# Patient Record
Sex: Female | Born: 1935
Health system: Southern US, Community
[De-identification: ages and names within clinical notes are randomized; demographics above are authoritative.]

## PROBLEM LIST (undated history)

## (undated) DIAGNOSIS — I10 Essential (primary) hypertension: Secondary | ICD-10-CM

## (undated) DIAGNOSIS — E785 Hyperlipidemia, unspecified: Secondary | ICD-10-CM

## (undated) DIAGNOSIS — E119 Type 2 diabetes mellitus without complications: Secondary | ICD-10-CM

## (undated) DIAGNOSIS — B029 Zoster without complications: Secondary | ICD-10-CM

## (undated) DIAGNOSIS — E039 Hypothyroidism, unspecified: Secondary | ICD-10-CM

## (undated) DIAGNOSIS — G459 Transient cerebral ischemic attack, unspecified: Secondary | ICD-10-CM

## (undated) DIAGNOSIS — R002 Palpitations: Secondary | ICD-10-CM

## (undated) DIAGNOSIS — I639 Cerebral infarction, unspecified: Secondary | ICD-10-CM

## (undated) DIAGNOSIS — I739 Peripheral vascular disease, unspecified: Secondary | ICD-10-CM

## (undated) DIAGNOSIS — J189 Pneumonia, unspecified organism: Secondary | ICD-10-CM

## (undated) DIAGNOSIS — N8110 Cystocele, unspecified: Secondary | ICD-10-CM

## (undated) DIAGNOSIS — C50911 Malignant neoplasm of unspecified site of right female breast: Secondary | ICD-10-CM

## (undated) DIAGNOSIS — I493 Ventricular premature depolarization: Secondary | ICD-10-CM

## (undated) DIAGNOSIS — I714 Abdominal aortic aneurysm, without rupture, unspecified: Secondary | ICD-10-CM

## (undated) DIAGNOSIS — E538 Deficiency of other specified B group vitamins: Secondary | ICD-10-CM

## (undated) DIAGNOSIS — I6529 Occlusion and stenosis of unspecified carotid artery: Secondary | ICD-10-CM

## (undated) DIAGNOSIS — N390 Urinary tract infection, site not specified: Secondary | ICD-10-CM

## (undated) DIAGNOSIS — J449 Chronic obstructive pulmonary disease, unspecified: Secondary | ICD-10-CM

## (undated) HISTORY — PX: THYROIDECTOMY: SHX17

## (undated) HISTORY — DX: Abdominal aortic aneurysm, without rupture: I71.4

## (undated) HISTORY — DX: Essential (primary) hypertension: I10

## (undated) HISTORY — DX: Hyperlipidemia, unspecified: E78.5

## (undated) HISTORY — DX: Urinary tract infection, site not specified: N39.0

## (undated) HISTORY — PX: CATARACT EXTRACTION W/ INTRAOCULAR LENS  IMPLANT, BILATERAL: SHX1307

## (undated) HISTORY — DX: Cerebral infarction, unspecified: I63.9

## (undated) HISTORY — DX: Palpitations: R00.2

## (undated) HISTORY — DX: Occlusion and stenosis of unspecified carotid artery: I65.29

## (undated) HISTORY — DX: Ventricular premature depolarization: I49.3

## (undated) HISTORY — DX: Deficiency of other specified B group vitamins: E53.8

## (undated) HISTORY — DX: Abdominal aortic aneurysm, without rupture, unspecified: I71.40

## (undated) HISTORY — PX: DILATION AND CURETTAGE OF UTERUS: SHX78

## (undated) HISTORY — PX: MASTECTOMY, RADICAL: SHX710

## (undated) HISTORY — DX: Peripheral vascular disease, unspecified: I73.9

## (undated) HISTORY — DX: Type 2 diabetes mellitus without complications: E11.9

## (undated) HISTORY — DX: Hypothyroidism, unspecified: E03.9

## (undated) HISTORY — DX: Zoster without complications: B02.9

## (undated) HISTORY — PX: ABDOMINAL HYSTERECTOMY: SHX81

## (undated) HISTORY — DX: Chronic obstructive pulmonary disease, unspecified: J44.9

---

## 1958-02-10 DIAGNOSIS — C50911 Malignant neoplasm of unspecified site of right female breast: Secondary | ICD-10-CM

## 1958-02-10 HISTORY — DX: Malignant neoplasm of unspecified site of right female breast: C50.911

## 2003-02-07 ENCOUNTER — Encounter: Payer: Self-pay | Admitting: Internal Medicine

## 2004-02-11 DIAGNOSIS — I639 Cerebral infarction, unspecified: Secondary | ICD-10-CM

## 2004-02-11 HISTORY — DX: Cerebral infarction, unspecified: I63.9

## 2004-02-17 ENCOUNTER — Inpatient Hospital Stay (HOSPITAL_COMMUNITY): Admission: EM | Admit: 2004-02-17 | Discharge: 2004-02-19 | Payer: Self-pay | Admitting: Emergency Medicine

## 2004-02-19 ENCOUNTER — Encounter (INDEPENDENT_AMBULATORY_CARE_PROVIDER_SITE_OTHER): Payer: Self-pay | Admitting: Cardiology

## 2004-02-29 ENCOUNTER — Ambulatory Visit: Payer: Self-pay | Admitting: Family Medicine

## 2004-03-11 ENCOUNTER — Ambulatory Visit: Payer: Self-pay | Admitting: Family Medicine

## 2004-04-01 ENCOUNTER — Ambulatory Visit: Payer: Self-pay | Admitting: Family Medicine

## 2004-04-05 ENCOUNTER — Encounter: Payer: Self-pay | Admitting: Internal Medicine

## 2004-05-02 ENCOUNTER — Ambulatory Visit: Payer: Self-pay | Admitting: Family Medicine

## 2004-05-06 ENCOUNTER — Ambulatory Visit: Payer: Self-pay | Admitting: Family Medicine

## 2004-07-04 ENCOUNTER — Ambulatory Visit: Payer: Self-pay | Admitting: Family Medicine

## 2004-08-08 ENCOUNTER — Ambulatory Visit: Payer: Self-pay | Admitting: Family Medicine

## 2004-08-14 ENCOUNTER — Ambulatory Visit: Payer: Self-pay | Admitting: Internal Medicine

## 2004-09-25 ENCOUNTER — Ambulatory Visit: Payer: Self-pay | Admitting: Internal Medicine

## 2004-10-01 ENCOUNTER — Encounter: Payer: Self-pay | Admitting: Internal Medicine

## 2004-10-01 ENCOUNTER — Ambulatory Visit: Payer: Self-pay

## 2004-11-14 ENCOUNTER — Ambulatory Visit: Payer: Self-pay | Admitting: Internal Medicine

## 2004-11-21 ENCOUNTER — Ambulatory Visit: Payer: Self-pay | Admitting: Internal Medicine

## 2004-12-26 ENCOUNTER — Ambulatory Visit: Payer: Self-pay | Admitting: Internal Medicine

## 2005-01-14 ENCOUNTER — Ambulatory Visit: Payer: Self-pay | Admitting: Gastroenterology

## 2005-01-29 ENCOUNTER — Encounter (INDEPENDENT_AMBULATORY_CARE_PROVIDER_SITE_OTHER): Payer: Self-pay | Admitting: *Deleted

## 2005-01-29 ENCOUNTER — Ambulatory Visit: Payer: Self-pay | Admitting: Gastroenterology

## 2005-01-29 ENCOUNTER — Encounter: Payer: Self-pay | Admitting: Internal Medicine

## 2005-02-13 ENCOUNTER — Ambulatory Visit: Payer: Self-pay | Admitting: Internal Medicine

## 2005-03-18 ENCOUNTER — Ambulatory Visit: Payer: Self-pay | Admitting: Internal Medicine

## 2005-04-25 ENCOUNTER — Ambulatory Visit: Payer: Self-pay | Admitting: Internal Medicine

## 2005-09-18 ENCOUNTER — Ambulatory Visit: Payer: Self-pay | Admitting: Internal Medicine

## 2005-11-05 ENCOUNTER — Ambulatory Visit: Payer: Self-pay | Admitting: Internal Medicine

## 2005-11-13 ENCOUNTER — Ambulatory Visit: Payer: Self-pay | Admitting: Internal Medicine

## 2005-11-16 ENCOUNTER — Encounter: Admission: RE | Admit: 2005-11-16 | Discharge: 2005-11-16 | Payer: Self-pay | Admitting: Internal Medicine

## 2005-12-03 ENCOUNTER — Ambulatory Visit: Payer: Self-pay | Admitting: Internal Medicine

## 2006-01-05 ENCOUNTER — Ambulatory Visit: Payer: Self-pay | Admitting: Internal Medicine

## 2006-01-06 ENCOUNTER — Ambulatory Visit: Payer: Self-pay | Admitting: Internal Medicine

## 2006-01-06 LAB — CONVERTED CEMR LAB
Hgb A1c MFr Bld: 5.8 % (ref 4.6–6.0)
Homocysteine: 13.1 micromoles/L (ref 5.00–13.90)
Sed Rate: 36 mm/hr — ABNORMAL HIGH (ref 0–25)
Triglyceride fasting, serum: 112 mg/dL (ref 0–149)

## 2006-02-17 ENCOUNTER — Encounter: Payer: Self-pay | Admitting: Internal Medicine

## 2006-03-05 DIAGNOSIS — I714 Abdominal aortic aneurysm, without rupture, unspecified: Secondary | ICD-10-CM | POA: Insufficient documentation

## 2006-03-05 DIAGNOSIS — E039 Hypothyroidism, unspecified: Secondary | ICD-10-CM | POA: Insufficient documentation

## 2006-03-05 DIAGNOSIS — E785 Hyperlipidemia, unspecified: Secondary | ICD-10-CM

## 2006-03-09 ENCOUNTER — Ambulatory Visit: Payer: Self-pay | Admitting: Internal Medicine

## 2006-04-09 ENCOUNTER — Ambulatory Visit: Payer: Self-pay | Admitting: Internal Medicine

## 2006-04-09 LAB — CONVERTED CEMR LAB
BUN: 10 mg/dL (ref 6–23)
CO2: 28 meq/L (ref 19–32)
Calcium: 9.3 mg/dL (ref 8.4–10.5)
GFR calc Af Amer: 91 mL/min
GFR calc non Af Amer: 75 mL/min
Potassium: 4.2 meq/L (ref 3.5–5.1)

## 2006-04-23 ENCOUNTER — Ambulatory Visit: Payer: Self-pay | Admitting: Internal Medicine

## 2006-07-16 ENCOUNTER — Telehealth (INDEPENDENT_AMBULATORY_CARE_PROVIDER_SITE_OTHER): Payer: Self-pay | Admitting: *Deleted

## 2006-08-03 ENCOUNTER — Ambulatory Visit: Payer: Self-pay | Admitting: Internal Medicine

## 2006-08-03 DIAGNOSIS — Z9089 Acquired absence of other organs: Secondary | ICD-10-CM

## 2006-08-03 DIAGNOSIS — E890 Postprocedural hypothyroidism: Secondary | ICD-10-CM | POA: Insufficient documentation

## 2006-08-03 DIAGNOSIS — I10 Essential (primary) hypertension: Secondary | ICD-10-CM | POA: Insufficient documentation

## 2006-08-03 DIAGNOSIS — I6350 Cerebral infarction due to unspecified occlusion or stenosis of unspecified cerebral artery: Secondary | ICD-10-CM | POA: Insufficient documentation

## 2006-08-03 DIAGNOSIS — D126 Benign neoplasm of colon, unspecified: Secondary | ICD-10-CM | POA: Insufficient documentation

## 2006-08-03 DIAGNOSIS — M810 Age-related osteoporosis without current pathological fracture: Secondary | ICD-10-CM | POA: Insufficient documentation

## 2006-08-05 LAB — CONVERTED CEMR LAB
ALT: 10 units/L (ref 0–40)
Basophils Absolute: 0.1 10*3/uL (ref 0.0–0.1)
Calcium: 9.3 mg/dL (ref 8.4–10.5)
Chloride: 110 meq/L (ref 96–112)
Cholesterol: 230 mg/dL (ref 0–200)
Direct LDL: 149.2 mg/dL
Eosinophils Absolute: 0.1 10*3/uL (ref 0.0–0.6)
GFR calc Af Amer: 90 mL/min
GFR calc non Af Amer: 75 mL/min
Glucose, Bld: 87 mg/dL (ref 70–99)
HDL: 42.7 mg/dL (ref >39.0)
Lymphocytes Relative: 26 % (ref 12.0–46.0)
MCHC: 34.3 g/dL (ref 30.0–36.0)
MCV: 92 fL (ref 78.0–100.0)
Monocytes Relative: 5.9 % (ref 3.0–11.0)
Neutro Abs: 8.1 10*3/uL — ABNORMAL HIGH (ref 1.4–7.7)
Platelets: 298 10*3/uL (ref 150–400)
RBC: 4.34 M/uL (ref 3.87–5.11)
Sodium: 143 meq/L (ref 135–145)
TSH: 2.48 microintl units/mL (ref 0.35–5.50)
Triglycerides: 151 mg/dL — ABNORMAL HIGH (ref 0–149)

## 2006-09-23 ENCOUNTER — Ambulatory Visit: Payer: Self-pay | Admitting: Internal Medicine

## 2006-09-29 ENCOUNTER — Ambulatory Visit: Payer: Self-pay | Admitting: Vascular Surgery

## 2006-09-30 ENCOUNTER — Ambulatory Visit: Payer: Self-pay | Admitting: Vascular Surgery

## 2006-10-02 ENCOUNTER — Encounter: Payer: Self-pay | Admitting: Internal Medicine

## 2006-10-26 ENCOUNTER — Ambulatory Visit: Payer: Self-pay | Admitting: Internal Medicine

## 2006-10-28 ENCOUNTER — Telehealth: Payer: Self-pay | Admitting: Internal Medicine

## 2006-10-29 ENCOUNTER — Encounter (INDEPENDENT_AMBULATORY_CARE_PROVIDER_SITE_OTHER): Payer: Self-pay | Admitting: *Deleted

## 2006-10-29 LAB — CONVERTED CEMR LAB
Ketones, urine, test strip: NEGATIVE
Nitrite: POSITIVE
Urobilinogen, UA: NEGATIVE

## 2006-10-30 ENCOUNTER — Encounter: Payer: Self-pay | Admitting: Internal Medicine

## 2006-11-02 ENCOUNTER — Ambulatory Visit: Payer: Self-pay | Admitting: Internal Medicine

## 2006-12-08 ENCOUNTER — Ambulatory Visit: Payer: Self-pay | Admitting: Internal Medicine

## 2007-02-16 ENCOUNTER — Ambulatory Visit: Payer: Self-pay | Admitting: Internal Medicine

## 2007-02-18 ENCOUNTER — Encounter: Payer: Self-pay | Admitting: Internal Medicine

## 2007-02-18 LAB — CONVERTED CEMR LAB
Hemoglobin, Urine: NEGATIVE
Leukocytes, UA: NEGATIVE
Nitrite: NEGATIVE
Protein, ur: NEGATIVE mg/dL
Urobilinogen, UA: 0.2 (ref 0.0–1.0)

## 2007-02-19 ENCOUNTER — Encounter: Payer: Self-pay | Admitting: Internal Medicine

## 2007-02-22 LAB — CONVERTED CEMR LAB
ALT: 11 units/L (ref 0–35)
AST: 18 units/L (ref 0–37)
BUN: 16 mg/dL (ref 6–23)
Calcium: 9.6 mg/dL (ref 8.4–10.5)
Chloride: 108 meq/L (ref 96–112)
Cholesterol: 195 mg/dL (ref 0–200)
GFR calc Af Amer: 70 mL/min
GFR calc non Af Amer: 58 mL/min
LDL Cholesterol: 121 mg/dL — ABNORMAL HIGH (ref 0–99)
Total CHOL/HDL Ratio: 4.4

## 2007-03-01 ENCOUNTER — Telehealth: Payer: Self-pay | Admitting: Internal Medicine

## 2007-04-19 ENCOUNTER — Ambulatory Visit: Payer: Self-pay | Admitting: Internal Medicine

## 2007-04-21 ENCOUNTER — Telehealth (INDEPENDENT_AMBULATORY_CARE_PROVIDER_SITE_OTHER): Payer: Self-pay | Admitting: *Deleted

## 2007-04-22 ENCOUNTER — Telehealth (INDEPENDENT_AMBULATORY_CARE_PROVIDER_SITE_OTHER): Payer: Self-pay | Admitting: *Deleted

## 2007-08-09 ENCOUNTER — Ambulatory Visit: Payer: Self-pay | Admitting: Internal Medicine

## 2007-08-09 LAB — CONVERTED CEMR LAB: Hemoglobin: 12.9 g/dL

## 2007-08-11 ENCOUNTER — Encounter: Payer: Self-pay | Admitting: Internal Medicine

## 2007-08-18 ENCOUNTER — Telehealth (INDEPENDENT_AMBULATORY_CARE_PROVIDER_SITE_OTHER): Payer: Self-pay | Admitting: *Deleted

## 2007-08-30 ENCOUNTER — Telehealth (INDEPENDENT_AMBULATORY_CARE_PROVIDER_SITE_OTHER): Payer: Self-pay | Admitting: *Deleted

## 2007-08-31 ENCOUNTER — Encounter (INDEPENDENT_AMBULATORY_CARE_PROVIDER_SITE_OTHER): Payer: Self-pay | Admitting: *Deleted

## 2007-08-31 ENCOUNTER — Telehealth (INDEPENDENT_AMBULATORY_CARE_PROVIDER_SITE_OTHER): Payer: Self-pay | Admitting: *Deleted

## 2007-10-11 ENCOUNTER — Ambulatory Visit: Payer: Self-pay | Admitting: Internal Medicine

## 2007-10-12 ENCOUNTER — Ambulatory Visit: Payer: Self-pay | Admitting: Vascular Surgery

## 2007-10-14 DIAGNOSIS — K5730 Diverticulosis of large intestine without perforation or abscess without bleeding: Secondary | ICD-10-CM | POA: Insufficient documentation

## 2007-10-15 ENCOUNTER — Telehealth (INDEPENDENT_AMBULATORY_CARE_PROVIDER_SITE_OTHER): Payer: Self-pay | Admitting: *Deleted

## 2007-10-15 ENCOUNTER — Ambulatory Visit: Payer: Self-pay | Admitting: Gastroenterology

## 2007-10-15 LAB — CONVERTED CEMR LAB
ALT: 9 units/L (ref 0–35)
AST: 17 units/L (ref 0–37)
CO2: 28 meq/L (ref 19–32)
Calcium: 9 mg/dL (ref 8.4–10.5)
Chloride: 113 meq/L — ABNORMAL HIGH (ref 96–112)
HDL: 37.9 mg/dL — ABNORMAL LOW (ref >39.0)
Potassium: 4.2 meq/L (ref 3.5–5.1)
Sodium: 144 meq/L (ref 135–145)
TSH: 3.47 microintl units/mL (ref 0.35–5.50)
Tissue Transglutaminase Ab, IgA: 0.6 units (ref <7)
Total CHOL/HDL Ratio: 3.7

## 2007-10-16 ENCOUNTER — Encounter: Payer: Self-pay | Admitting: Gastroenterology

## 2007-10-19 ENCOUNTER — Ambulatory Visit: Payer: Self-pay | Admitting: Internal Medicine

## 2007-10-19 LAB — CONVERTED CEMR LAB
Transferrin: 204.7 mg/dL — ABNORMAL LOW (ref >=212.0)
Vitamin B-12: 199 pg/mL — ABNORMAL LOW (ref 211–911)

## 2007-10-26 ENCOUNTER — Telehealth (INDEPENDENT_AMBULATORY_CARE_PROVIDER_SITE_OTHER): Payer: Self-pay | Admitting: *Deleted

## 2007-11-09 ENCOUNTER — Ambulatory Visit: Payer: Self-pay | Admitting: Gastroenterology

## 2007-11-09 DIAGNOSIS — K5900 Constipation, unspecified: Secondary | ICD-10-CM | POA: Insufficient documentation

## 2007-11-09 DIAGNOSIS — K902 Blind loop syndrome, not elsewhere classified: Secondary | ICD-10-CM

## 2007-12-15 ENCOUNTER — Ambulatory Visit: Payer: Self-pay | Admitting: Internal Medicine

## 2008-01-19 ENCOUNTER — Ambulatory Visit: Payer: Self-pay | Admitting: Internal Medicine

## 2008-02-02 ENCOUNTER — Ambulatory Visit: Payer: Self-pay | Admitting: Internal Medicine

## 2008-02-08 ENCOUNTER — Telehealth: Payer: Self-pay | Admitting: Internal Medicine

## 2008-04-18 ENCOUNTER — Ambulatory Visit: Payer: Self-pay | Admitting: Internal Medicine

## 2008-04-18 DIAGNOSIS — I739 Peripheral vascular disease, unspecified: Secondary | ICD-10-CM | POA: Insufficient documentation

## 2008-04-21 LAB — CONVERTED CEMR LAB
BUN: 19 mg/dL (ref 6–23)
Chloride: 108 meq/L (ref 96–112)
Eosinophils Relative: 1.4 % (ref 0.0–5.0)
Glucose, Bld: 90 mg/dL (ref 70–99)
HCT: 37.2 % (ref 36.0–46.0)
Monocytes Relative: 5.5 % (ref 3.0–12.0)
Neutrophils Relative %: 63.4 % (ref 43.0–77.0)
Platelets: 233 10*3/uL (ref 150–400)
Potassium: 5 meq/L (ref 3.5–5.1)
RBC: 3.94 M/uL (ref 3.87–5.11)
Vit D, 25-Hydroxy: 20 ng/mL — ABNORMAL LOW (ref 30–89)
WBC: 9.1 10*3/uL (ref 4.5–10.5)

## 2008-04-25 ENCOUNTER — Telehealth (INDEPENDENT_AMBULATORY_CARE_PROVIDER_SITE_OTHER): Payer: Self-pay | Admitting: *Deleted

## 2008-10-11 ENCOUNTER — Ambulatory Visit: Payer: Self-pay | Admitting: Vascular Surgery

## 2008-11-08 ENCOUNTER — Ambulatory Visit: Payer: Self-pay | Admitting: Internal Medicine

## 2008-11-08 DIAGNOSIS — R7309 Other abnormal glucose: Secondary | ICD-10-CM

## 2008-11-09 ENCOUNTER — Telehealth: Payer: Self-pay | Admitting: Gastroenterology

## 2008-11-13 LAB — CONVERTED CEMR LAB
ALT: 10 units/L (ref 0–35)
AST: 17 units/L (ref 0–37)
Chloride: 109 meq/L (ref 96–112)
Folate: 12.3 ng/mL
Hemoglobin: 12.9 g/dL (ref 12.0–15.0)
Hgb A1c MFr Bld: 5.8 % (ref 4.6–6.5)
Potassium: 4.3 meq/L (ref 3.5–5.1)
Sodium: 143 meq/L (ref 135–145)
Vitamin B-12: 152 pg/mL — ABNORMAL LOW (ref 211–911)

## 2008-11-14 ENCOUNTER — Ambulatory Visit: Payer: Self-pay | Admitting: Internal Medicine

## 2008-11-20 ENCOUNTER — Ambulatory Visit: Payer: Self-pay | Admitting: Vascular Surgery

## 2008-11-21 ENCOUNTER — Ambulatory Visit: Payer: Self-pay | Admitting: Gastroenterology

## 2008-11-22 ENCOUNTER — Ambulatory Visit: Payer: Self-pay | Admitting: Gastroenterology

## 2008-11-22 ENCOUNTER — Encounter: Payer: Self-pay | Admitting: Gastroenterology

## 2008-11-23 ENCOUNTER — Encounter: Payer: Self-pay | Admitting: Gastroenterology

## 2008-11-28 ENCOUNTER — Telehealth: Payer: Self-pay | Admitting: Internal Medicine

## 2008-11-28 DIAGNOSIS — I6529 Occlusion and stenosis of unspecified carotid artery: Secondary | ICD-10-CM

## 2008-11-28 DIAGNOSIS — I739 Peripheral vascular disease, unspecified: Secondary | ICD-10-CM

## 2008-11-28 DIAGNOSIS — I779 Disorder of arteries and arterioles, unspecified: Secondary | ICD-10-CM | POA: Insufficient documentation

## 2008-11-28 HISTORY — DX: Occlusion and stenosis of unspecified carotid artery: I65.29

## 2008-12-04 ENCOUNTER — Encounter: Payer: Self-pay | Admitting: Internal Medicine

## 2008-12-04 ENCOUNTER — Telehealth: Payer: Self-pay | Admitting: Gastroenterology

## 2008-12-06 ENCOUNTER — Telehealth: Payer: Self-pay | Admitting: Physician Assistant

## 2008-12-11 ENCOUNTER — Telehealth: Payer: Self-pay | Admitting: Physician Assistant

## 2008-12-12 ENCOUNTER — Ambulatory Visit: Payer: Self-pay | Admitting: Internal Medicine

## 2008-12-12 DIAGNOSIS — Z853 Personal history of malignant neoplasm of breast: Secondary | ICD-10-CM | POA: Insufficient documentation

## 2008-12-13 ENCOUNTER — Encounter: Payer: Self-pay | Admitting: Physician Assistant

## 2008-12-14 LAB — CONVERTED CEMR LAB
Basophils Relative: 0.7 % (ref 0.0–3.0)
CO2: 22 meq/L (ref 19–32)
Calcium: 8.8 mg/dL (ref 8.4–10.5)
Creatinine, Ser: 1 mg/dL (ref 0.4–1.2)
Eosinophils Absolute: 0.2 10*3/uL (ref 0.0–0.7)
Lymphocytes Relative: 19.1 % (ref 12.0–46.0)
MCHC: 35.3 g/dL (ref 30.0–36.0)
Neutrophils Relative %: 68.4 % (ref 43.0–77.0)
RBC: 3.7 M/uL — ABNORMAL LOW (ref 3.87–5.11)
WBC: 10.7 10*3/uL — ABNORMAL HIGH (ref 4.5–10.5)

## 2008-12-19 ENCOUNTER — Telehealth: Payer: Self-pay | Admitting: Physician Assistant

## 2009-01-08 ENCOUNTER — Encounter: Payer: Self-pay | Admitting: Internal Medicine

## 2009-01-08 ENCOUNTER — Ambulatory Visit: Payer: Self-pay | Admitting: Surgery

## 2009-01-09 ENCOUNTER — Ambulatory Visit: Payer: Self-pay | Admitting: Gastroenterology

## 2009-02-26 ENCOUNTER — Encounter (INDEPENDENT_AMBULATORY_CARE_PROVIDER_SITE_OTHER): Payer: Self-pay | Admitting: *Deleted

## 2009-04-10 ENCOUNTER — Ambulatory Visit: Payer: Self-pay | Admitting: Internal Medicine

## 2009-04-10 DIAGNOSIS — M25519 Pain in unspecified shoulder: Secondary | ICD-10-CM | POA: Insufficient documentation

## 2009-04-13 ENCOUNTER — Ambulatory Visit: Payer: Self-pay | Admitting: Internal Medicine

## 2009-04-19 LAB — CONVERTED CEMR LAB
ALT: 13 units/L (ref 0–35)
AST: 20 units/L (ref 0–37)
HDL: 50 mg/dL (ref >39.00)
TSH: 3.95 microintl units/mL (ref 0.35–5.50)
Vitamin B-12: 267 pg/mL (ref 211–911)

## 2009-05-31 ENCOUNTER — Encounter: Payer: Self-pay | Admitting: Internal Medicine

## 2009-05-31 ENCOUNTER — Ambulatory Visit: Payer: Self-pay | Admitting: Surgery

## 2009-06-12 ENCOUNTER — Telehealth: Payer: Self-pay | Admitting: Internal Medicine

## 2009-07-27 ENCOUNTER — Telehealth (INDEPENDENT_AMBULATORY_CARE_PROVIDER_SITE_OTHER): Payer: Self-pay | Admitting: *Deleted

## 2009-09-25 ENCOUNTER — Ambulatory Visit: Payer: Self-pay | Admitting: Internal Medicine

## 2009-09-25 DIAGNOSIS — N329 Bladder disorder, unspecified: Secondary | ICD-10-CM

## 2009-09-25 LAB — CONVERTED CEMR LAB
Bilirubin Urine: NEGATIVE
Glucose, Urine, Semiquant: NEGATIVE
Ketones, urine, test strip: NEGATIVE
Specific Gravity, Urine: 1.025
pH: 6

## 2009-09-26 ENCOUNTER — Encounter: Payer: Self-pay | Admitting: Internal Medicine

## 2009-09-26 LAB — CONVERTED CEMR LAB
Crystals: NONE SEEN
Squamous Epithelial / LPF: NONE SEEN /lpf

## 2009-10-18 ENCOUNTER — Encounter: Payer: Self-pay | Admitting: Internal Medicine

## 2009-11-15 ENCOUNTER — Ambulatory Visit: Payer: Self-pay | Admitting: Internal Medicine

## 2009-11-15 DIAGNOSIS — F17200 Nicotine dependence, unspecified, uncomplicated: Secondary | ICD-10-CM | POA: Insufficient documentation

## 2009-11-20 LAB — CONVERTED CEMR LAB
BUN: 14 mg/dL (ref 6–23)
Basophils Absolute: 0 10*3/uL (ref 0.0–0.1)
CO2: 24 meq/L (ref 19–32)
Chloride: 106 meq/L (ref 96–112)
Creatinine, Ser: 0.9 mg/dL (ref 0.4–1.2)
Eosinophils Relative: 1.3 % (ref 0.0–5.0)
HCT: 36.8 % (ref 36.0–46.0)
Hemoglobin: 12.6 g/dL (ref 12.0–15.0)
Lymphs Abs: 2.3 10*3/uL (ref 0.7–4.0)
MCV: 97.1 fL (ref 78.0–100.0)
Monocytes Absolute: 0.6 10*3/uL (ref 0.1–1.0)
Neutro Abs: 5.4 10*3/uL (ref 1.4–7.7)
Platelets: 298 10*3/uL (ref 150.0–400.0)
RDW: 14.5 % (ref 11.5–14.6)

## 2010-01-18 ENCOUNTER — Ambulatory Visit: Payer: Self-pay | Admitting: Vascular Surgery

## 2010-03-12 NOTE — Assessment & Plan Note (Signed)
Summary: med refill / flu shot/cbs   Vital Signs:  Patient profile:   77 year old female Weight:      138.38 pounds Pulse rate:   63 / minute Pulse rhythm:   regular BP sitting:   126 / 82  (left arm) Cuff size:   regular  Vitals Entered By: Army Fossa CMA (November 15, 2009 10:34 AM) CC: Pt here for f/u- not fasting Comments Flu shot    History of Present Illness: ROV no concerns  had a episode of shingles last week (went to a UC), "mild case"  ROS still smokes, 1/2 ppd , does not like to discuss the issue w/ me today Hyperlipidemia-- good medication compliance  Breast cancer, hx of-- we discussed the issue she decided she will not have another MMG Hypertension-- ambulatory BPs wnl (120-130/60-70) B12 deficiency-- on no suplements   Current Medications (verified): 1)  Aggrenox 25-200 Mg Cp12 (Aspirin-Dipyridamole) .... Take 1 Capsule By Mouth Twice A Day 2)  Lipitor 20 Mg  Tabs (Atorvastatin Calcium) .... 1/2 By Mouth Once Daily  Fax [phone] 3)  Metoprolol Tartrate 50 Mg Tabs (Metoprolol Tartrate) .Marland Kitchen.. 1 Tablet By Mouth Twice A Day 4)  Benazepril Hcl 20 Mg  Tabs (Benazepril Hcl) .... One P.o. B.i.d. 5)  Felodipine 10 Mg Xr24h-Tab (Felodipine) .Marland Kitchen.. 1 By Mouth Once Daily 6)  Vitamin B-12 1000 Mcg Tabs (Cyanocobalamin) .... One Tablet By Mouth Once Daily 7)  Centrum Silver  Tabs (Multiple Vitamins-Minerals) .... One Tablet Once Daily 8)  Prothesis For Right Breast .... Cup Size 36-B 9)  Prothesis Bra .... Cup Size 36-B  Allergies (verified): No Known Drug Allergies  Past History:  Past Medical History: Hyperlipidemia Breast cancer, hx of (1960) Osteoporosis (2003) Cerebrovascular accident, hx of (2006) Hypertension had transient Hypothyroidism d/t thyroidectomy AAA u/s   done 10/11/2008, next in 3 years B12 deficiency history of shingles, repeated episode 9-11, went to a UC  Past Surgical History: Reviewed history from 04/18/2008 and no changes  required. Radical mastectomy right---remotely (60s) Hysterectomy, no oophorectomy Thyroidectomy  Social History: lives alone Has 3 children in [place]. has a sister that lives in the [place] , [place] Daily Caffeine Use Illicit Drug Use - no tobacco, half-pack daily    Physical Exam  General:  alert and well-nourished.   Lungs:  normal respiratory effort, no intercostal retractions, no accessory muscle use, and normal breath sounds.   Heart:  normal rate, regular rhythm, and no murmur.   Extremities:  no edema Psych:  Oriented X3, memory intact for recent and remote, normally interactive, good eye contact, not anxious appearing, and not depressed appearing.     Impression & Recommendations:  Problem # 1:  HYPERTENSION (ICD-401.9) well-controlled Her updated medication list for this problem includes:    Metoprolol Tartrate 50 Mg Tabs (Metoprolol tartrate) .Marland Kitchen... 1 tablet by mouth twice a day    Benazepril Hcl 20 Mg Tabs (Benazepril hcl) ..... One p.o. b.i.d.    Felodipine 10 Mg Xr24h-tab (Felodipine) .Marland Kitchen... 1 by mouth once daily  Orders: TLB-BMP (Basic Metabolic Panel-BMET) (80048-METABOL) TLB-CBC Platelet - w/Differential (85025-CBCD) Specimen Handling (73710)  BP today: 126/82 Prior BP: 128/80 (09/25/2009)  Labs Reviewed: K+: 4.3 (12/12/2008) Creat: : 1.0 (12/12/2008)   Chol: 167 (04/13/2009)   HDL: 50.00 (04/13/2009)   LDL: 92 (04/13/2009)   TG: 123.0 (04/13/2009)  Problem # 2:  HYPERGLYCEMIA, BORDERLINE (ICD-790.29) we'll check a hemoglobin A1c Orders: Venipuncture (62694) TLB-A1C / Hgb A1C (Glycohemoglobin) (83036-A1C) Specimen Handling (85462)  Labs Reviewed: Creat: 1.0 (12/12/2008)     Problem # 3:  ANEMIA, B12 DEFICIENCY (ICD-281.1) has decided not to take ANY  supplements The following medications were removed from the medication list:    Vitamin B-12 1000 Mcg Tabs (Cyanocobalamin) ..... One tablet by mouth once daily    Nascobal 500 Mcg/0.52ml Soln  (Cyanocobalamin) .Marland Kitchen... 1 spray in one nostril once a week (alternate sides weekly )  Problem # 4:  PERSONAL HX BREAST CANCER (ICD-V10.3) has not had a mammogram in years and has decided to have them anymore  "I could not fight  cancer again"  I respect her wishes  Problem # 5:  TOBACCO ABUSE (ICD-305.1) did not like to even discuss the issue with me  Complete Medication List: 1)  Aggrenox 25-200 Mg Cp12 (Aspirin-dipyridamole) .... Take 1 capsule by mouth twice a day 2)  Lipitor 20 Mg Tabs (Atorvastatin calcium) .... 1/2 by mouth once daily  fax [phone] 3)  Metoprolol Tartrate 50 Mg Tabs (Metoprolol tartrate) .Marland Kitchen.. 1 tablet by mouth twice a day 4)  Benazepril Hcl 20 Mg Tabs (Benazepril hcl) .... One p.o. b.i.d. 5)  Felodipine 10 Mg Xr24h-tab (Felodipine) .Marland Kitchen.. 1 by mouth once daily 6)  Centrum Silver Tabs (Multiple vitamins-minerals) .... One tablet once daily 7)  Prothesis For Right Breast  .... Cup size 36-b 8)  Prothesis Bra  .... Cup size 36-b  Other Orders: Flu Vaccine 33yrs + MEDICARE PATIENTS (A5409) Administration Flu vaccine - MCR (W1191)  Patient Instructions: 1)  Please schedule a follow-up appointment in 6 months .    Flu Vaccine Consent Questions     Do you have a history of severe allergic reactions to this vaccine? no    Any prior history of allergic reactions to egg and/or gelatin? no    Do you have a sensitivity to the preservative Thimersol? no    Do you have a past history of Guillan-Barre Syndrome? no    Do you currently have an acute febrile illness? no    Have you ever had a severe reaction to latex? no    Vaccine information given and explained to patient? yes    Are you currently pregnant? no    Lot Number:[id number]   Exp Date:08/10/2010   Site Given  Left Deltoid IMu1

## 2010-03-12 NOTE — Progress Notes (Signed)
Summary: carotid results, pt requesting  Phone Note Call from Patient Call back at Home Phone [phone]   Caller: Patient Summary of Call: pt called wants results of doppler that [facility]  Initial call taken by: Kandice Hams,  Jun 12, 2009 2:00 PM  Follow-up for Phone Call        left message for tech (andy) to fax results patient number busy Shary Decamp  Jun 12, 2009 3:22 PM  results on your ledge - please review.  Left message on machine for pt that I would contact her after MD reviews Shary Decamp  Jun 13, 2009 1:23 PM   Additional Follow-up for Phone Call Additional follow up Details #1::        carotid ultrasound done at [facility] 05/31/09 showed bilateral internal carotid artery stenosis 60% to 79%. Based on these results on the assessment and plan from a vascular surgeon from 11-10  they are planning to hold off on surgery for now (previous ultrasound 10/10 showed  the same results in the  L, on the right side the occlusion was 40% to 59%) Additional Follow-up by: Rayma Hegg E. Kiyla Ringler MD,  Jun 13, 2009 4:00 PM    Additional Follow-up for Phone Call Additional follow up Details #2::    discussed with pt Shary Decamp  Jun 13, 2009 4:36 PM

## 2010-03-12 NOTE — Letter (Signed)
Summary: Primary Care Appointment Letter  [facility]  [address] [place], [place] [address]   Phone: [phone]  Fax: [phone]    02/26/2009 MRN: [id number]  Crystal Osborne [address] [place], [place]  [address]  Dear Ms. Crystal Osborne,   Your Primary Care Physician [employer] E. Paz MD has indicated that:    ____X___it is time to schedule an appointment.  Please call our office @ [phone] to schedule a return office visit with Dr. Drue Novel in 04/2009.     Thank you,    [facility]

## 2010-03-12 NOTE — Progress Notes (Signed)
Summary:  PROTHESIS  Phone Note Call from Patient Call back at Home Phone [phone]   Caller: Patient Summary of Call: pt states that she needs a rx for prothesis for her right breast and the bra due to mastectomy. Pt needs rx in order for insurance to pay....pls advise..............Marland KitchenFelecia Deloach CMA  July 27, 2009 4:41 PM  his right prescription as requested by the patient Elita Quick E. Paz MD  July 30, 2009 11:50 AM   Follow-up for Phone Call        tried  to call pt line busy will try again later. need to get pt cup size..........Marland KitchenFelecia Deloach CMA  July 31, 2009 1:54 PM   36-B  mailed to pt.Marti Sleigh Deloach CMA  July 31, 2009 5:02 PM     New/Updated Medications: * PROTHESIS FOR RIGHT BREAST cup size 36-B * PROTHESIS BRA cup size 36-b Prescriptions: PROTHESIS BRA cup size 36-b  #1 x 0   Entered by:   Jeremy Johann CMA   Authorized by:   Nolon Rod. Paz MD   Signed by:   Jeremy Johann CMA on 07/31/2009   Method used:   Print then Mail to Patient   RxID:   [id number] PROTHESIS FOR RIGHT BREAST cup size 36-B  #1 x 0   Entered by:   Jeremy Johann CMA   Authorized by:   Nolon Rod. Paz MD   Signed by:   Jeremy Johann CMA on 07/31/2009   Method used:   Print then Give to Patient   RxID:   [id number]

## 2010-03-12 NOTE — Assessment & Plan Note (Signed)
Summary: FOLLOW UP/RH.....   Vital Signs:  Patient profile:   77 year old female Height:      67 inches Weight:      137 pounds BMI:     21.53 Pulse rate:   60 / minute BP sitting:   130 / 90  Vitals Entered By: Shary Decamp (April 10, 2009 10:38 AM) CC: rov - needs to chanage DCN pt does not like to take CDN because it gives her a HA   History of Present Illness: R shoulder pain:  pain going on for years since  she had a mastectomy needs to chanage DCN pt does not like to take CDN because it gives her a HA tylenol helps a little   Carotid artery dz -- [facility]  reviewed , patient counseled, plan so far is observation and f/u in few months   B12 deficiency-- was Rx a nasal sprays but never did get it  "I just didn't" intolerant to shots , got 1 at the GI office but no further shots  she is taking B12 OTC pills by mouth  Hypertension-- no ambulatory BPs       Current Medications (verified): 1)  Aggrenox 25-200 Mg Cp12 (Aspirin-Dipyridamole) .... Take 1 Capsule By Mouth Twice A Day 2)  Lipitor 20 Mg  Tabs (Atorvastatin Calcium) .... 1/2 By Mouth Once Daily  Fax [phone] 3)  Metoprolol Tartrate 50 Mg Tabs (Metoprolol Tartrate) .Marland Kitchen.. 1 Tablet By Mouth Twice A Day 4)  Benazepril Hcl 20 Mg  Tabs (Benazepril Hcl) .... One P.o. B.i.d. 5)  Felodipine 10 Mg Xr24h-Tab (Felodipine) .Marland Kitchen.. 1 By Mouth Once Daily 6)  Vitamin B-12 1000 Mcg Tabs (Cyanocobalamin) .... One Tablet By Mouth Once Daily 7)  Lomotil 2.5-0.025 Mg Tabs (Diphenoxylate-Atropine) .... Take 1 Tab Every 6 Hours As Needed For Diarrhea 8)  Align  Caps (Probiotic Product) .... One Capsule By Mouth Once Daily 9)  Centrum Silver  Tabs (Multiple Vitamins-Minerals) .... One Tablet Once Daily  Allergies (verified): No Known Drug Allergies  Past History:  Past Medical History: Hyperlipidemia Breast cancer, hx of (1960) Osteoporosis (2003) Cerebrovascular accident, hx of (2006) Hypertension had transient  Hypothyroidism d/t thyroidectomy AAA u/s   done 10/11/2008, next in 3 years B12 deficiency history of shingles  Past Surgical History: Reviewed history from 04/18/2008 and no changes required. Radical mastectomy right---remotely (60s) Hysterectomy, no oophorectomy Thyroidectomy  Social History: Reviewed history from 04/18/2008 and no changes required. lives alone Has 3 children in [place]. has a sister that lives in the [place] , [place] Daily Caffeine Use Illicit Drug Use - no    Review of Systems       no fever or chills no lower extremity paresthesias except for burning feeling in the left popliteal area that self resolve no myalgias  no further nausea vomiting or diarrhea no blood in the  stools  Physical Exam  General:  alert and well-developed.  alert and well-developed.   Lungs:  normal respiratory effort, no intercostal retractions, no accessory muscle use, and normal breath sounds.   Heart:  normal rate, regular rhythm, and no murmur.   Extremities:  no edema Neurologic:  gait normal, lower extremity strength and reflexes symmetric   Impression & Recommendations:  Problem # 1:  SHOULDER PAIN (ICD-719.41)  chronic right shoulder pain, intolerant to Darvocet ( which is now off the market) plan: Tylenol as needed, if pain severe, try Vicodin The following medications were removed from the  medication list:    Darvocet-n 100 100-650 Mg Tabs (Propoxyphene n-apap) .Marland Kitchen... 1 by mouth every 8 hours as needed for pain Her updated medication list for this problem includes:    Vicodin 5-500 Mg Tabs (Hydrocodone-acetaminophen) ..... Half to one tablet every 6 hours as needed for pain  Problem # 2:  HYPOTHYROIDISM (ICD-244.9) labs  Problem # 3:  HYPERTENSION (ICD-401.9) no change for now Her updated medication list for this problem includes:    Metoprolol Tartrate 50 Mg Tabs (Metoprolol tartrate) .Marland Kitchen... 1 tablet by mouth twice a day    Benazepril Hcl 20 Mg Tabs (Benazepril  hcl) ..... One p.o. b.i.d.    Felodipine 10 Mg Xr24h-tab (Felodipine) .Marland Kitchen... 1 by mouth once daily  BP today: 130/90 Prior BP: 108/62 (01/09/2009)  Labs Reviewed: K+: 4.3 (12/12/2008) Creat: : 1.0 (12/12/2008)   Chol: 139 (10/11/2007)   HDL: 37.9 (10/11/2007)   LDL: 78 (10/11/2007)   TG: 114 (10/11/2007)  Problem # 4:  HYPERLIPIDEMIA (ICD-272.4) due for labs Her updated medication list for this problem includes:    Lipitor 20 Mg Tabs (Atorvastatin calcium) .Marland Kitchen... 1/2 by mouth once daily  fax [phone]  Labs Reviewed: SGOT: 17 (11/08/2008)   SGPT: 10 (11/08/2008)   HDL:37.9 (10/11/2007), 44.3 (02/16/2007)  LDL:78 (10/11/2007), 121 (45/40/9811)  Chol:139 (10/11/2007), 195 (02/16/2007)  Trig:114 (10/11/2007), 148 (02/16/2007)  Problem # 5:  ANEMIA, B12 DEFICIENCY (ICD-281.1) B12 deficiency, not taking supplement except for over-the-counter tablets explained potential complications of a B12 deficiency strongly suggested to proceed  w/  nasal spray if she is intolerant of shots lenghty conversation about this issue  labs Her updated medication list for this problem includes:    Vitamin B-12 1000 Mcg Tabs (Cyanocobalamin) ..... One tablet by mouth once daily    Nascobal 500 Mcg/0.68ml Soln (Cyanocobalamin) .Marland Kitchen... 1 spray in one nostril once a week (alternate sides weekly )  Problem # 6:  CAROTID ARTERY DISEASE (ICD-433.10) discussed with patient last surgical evaluation.   Plan-- reassess carotids  periodically Her updated medication list for this problem includes:    Aggrenox 25-200 Mg Cp12 (Aspirin-dipyridamole) .Marland Kitchen... Take 1 capsule by mouth twice a day  Problem # 7:  F2F > 25 min due to # of issues assesed and conseling about B12  Complete Medication List: 1)  Aggrenox 25-200 Mg Cp12 (Aspirin-dipyridamole) .... Take 1 capsule by mouth twice a day 2)  Lipitor 20 Mg Tabs (Atorvastatin calcium) .... 1/2 by mouth once daily  fax [phone] 3)  Metoprolol Tartrate 50 Mg Tabs (Metoprolol  tartrate) .Marland Kitchen.. 1 tablet by mouth twice a day 4)  Benazepril Hcl 20 Mg Tabs (Benazepril hcl) .... One p.o. b.i.d. 5)  Felodipine 10 Mg Xr24h-tab (Felodipine) .Marland Kitchen.. 1 by mouth once daily 6)  Vitamin B-12 1000 Mcg Tabs (Cyanocobalamin) .... One tablet by mouth once daily 7)  Lomotil 2.5-0.025 Mg Tabs (Diphenoxylate-atropine) .... Take 1 tab every 6 hours as needed for diarrhea 8)  Align Caps (Probiotic product) .... One capsule by mouth once daily 9)  Centrum Silver Tabs (Multiple vitamins-minerals) .... One tablet once daily 10)  Vicodin 5-500 Mg Tabs (Hydrocodone-acetaminophen) .... Half to one tablet every 6 hours as needed for pain 11)  Nascobal 500 Mcg/0.72ml Soln (Cyanocobalamin) .Marland Kitchen.. 1 spray in one nostril once a week (alternate sides weekly )  Patient Instructions: 1)  come back fasting with the few days, 2)  AST, ALT, FLP----dx  high cholesterol 3)  B12 level------Dx B12 deficiency 4)  TSH----Dx  hypothyroidism 5)  for pain, use Tylenol 500 g every 6 hours.  If the pain is severe, try Vicodin as prescribed;  may cause drowsiness 6)  Please schedule a follow-up appointment in 3 months (yearly checkup) Prescriptions: NASCOBAL 500 MCG/0.1ML SOLN (CYANOCOBALAMIN) 1 spray in one nostril once a week (alternate sides weekly )  #1 x 0   Entered and Authorized by:   Nolon Rod. Obdulio Mash MD   Signed by:   Nolon Rod. Mallie Giambra MD on 04/10/2009   Method used:   Print then Give to Patient   RxID:   [id number] VICODIN 5-500 MG TABS (HYDROCODONE-ACETAMINOPHEN) half to one tablet every 6 hours as needed for pain  #40 x 0   Entered and Authorized by:   Nolon Rod. Reaghan Kawa MD   Signed by:   Nolon Rod. Madalina Rosman MD on 04/10/2009   Method used:   Print then Give to Patient   RxID:   [id number]

## 2010-03-12 NOTE — Consult Note (Signed)
Summary: rectocele, decided observation---[facility]  [facility]   Imported By: Lanelle Bal 10/30/2009 08:53:41  _____________________________________________________________________  External Attachment:    Type:   Image     Comment:   External Document

## 2010-03-12 NOTE — Assessment & Plan Note (Signed)
Summary: feels pressure on bladder /cbs   Vital Signs:  Patient profile:   77 year old female Weight:      136.38 pounds Pulse rate:   57 / minute Pulse rhythm:   regular BP sitting:   128 / 80  (left arm) Cuff size:   regular  Vitals Entered By: Army Fossa CMA (September 25, 2009 10:48 AM) CC: Feels pressure on bladder. Urinating more frequently.   History of Present Illness: " I think my bladder is in the vagina" This is going on for while, causing pressure, she thinks is also causing nocturia  ROS No dysuria or gross hematuria No fever No vaginal discharge or spotting  Current Medications (verified): 1)  Aggrenox 25-200 Mg Cp12 (Aspirin-Dipyridamole) .... Take 1 Capsule By Mouth Twice A Day 2)  Lipitor 20 Mg  Tabs (Atorvastatin Calcium) .... 1/2 By Mouth Once Daily  Fax [phone] 3)  Metoprolol Tartrate 50 Mg Tabs (Metoprolol Tartrate) .Marland Kitchen.. 1 Tablet By Mouth Twice A Day 4)  Benazepril Hcl 20 Mg  Tabs (Benazepril Hcl) .... One P.o. B.i.d. 5)  Felodipine 10 Mg Xr24h-Tab (Felodipine) .Marland Kitchen.. 1 By Mouth Once Daily 6)  Vitamin B-12 1000 Mcg Tabs (Cyanocobalamin) .... One Tablet By Mouth Once Daily 7)  Lomotil 2.5-0.025 Mg Tabs (Diphenoxylate-Atropine) .... Take 1 Tab Every 6 Hours As Needed For Diarrhea 8)  Align  Caps (Probiotic Product) .... One Capsule By Mouth Once Daily 9)  Centrum Silver  Tabs (Multiple Vitamins-Minerals) .... One Tablet Once Daily 10)  Vicodin 5-500 Mg Tabs (Hydrocodone-Acetaminophen) .... Half To One Tablet Every 6 Hours As Needed For Pain 11)  Nascobal 500 Mcg/0.64ml Soln (Cyanocobalamin) .Marland Kitchen.. 1 Spray in One Nostril Once A Week (Alternate Sides Weekly ) 12)  Prothesis For Right Breast .... Cup Size 36-B 13)  Prothesis Bra .... Cup Size 36-B  Allergies (verified): No Known Drug Allergies  Past History:  Past Medical History: Reviewed history from 04/10/2009 and no changes required. Hyperlipidemia Breast cancer, hx of (1960) Osteoporosis  (2003) Cerebrovascular accident, hx of (2006) Hypertension had transient Hypothyroidism d/t thyroidectomy AAA u/s   done 10/11/2008, next in 3 years B12 deficiency history of shingles  Past Surgical History: Reviewed history from 04/18/2008 and no changes required. Radical mastectomy right---remotely (60s) Hysterectomy, no oophorectomy Thyroidectomy  Social History: Reviewed history from 04/18/2008 and no changes required. lives alone Has 3 children in [place]. has a sister that lives in the [place] , [place] Daily Caffeine Use Illicit Drug Use - no    Physical Exam  General:  alert and well-developed.   Abdomen:  soft, non-tender, no distention, no masses, no guarding, and no rigidity.   Rectal:  no mass, brown stools Genitalia:  normal introitus, no external lesions, no vaginal discharge, and mucosa pink and moist.  no vaginal lesions She has a mild bladder prolapse and a even more noticeable rectal prolapse. Bimanual exam---> no mass   Impression & Recommendations:  Problem # 1:  BLADDER PROLAPSE (ICD-596.9)  has a rectal and a mild bladder prolapse chart reviewed, remote hysterectomy without oophorectomy No recent Pap smears or gyn evaluation. plan: Urine culture Refer to gynecology  (needs to be after 10.30 am)   Orders: Gynecologic Referral (Gyn)  Complete Medication List: 1)  Aggrenox 25-200 Mg Cp12 (Aspirin-dipyridamole) .... Take 1 capsule by mouth twice a day 2)  Lipitor 20 Mg Tabs (Atorvastatin calcium) .... 1/2 by mouth once daily  fax [phone] 3)  Metoprolol Tartrate 50 Mg Tabs (Metoprolol tartrate) .Marland KitchenMarland KitchenMarland Kitchen  1 tablet by mouth twice a day 4)  Benazepril Hcl 20 Mg Tabs (Benazepril hcl) .... One p.o. b.i.d. 5)  Felodipine 10 Mg Xr24h-tab (Felodipine) .Marland Kitchen.. 1 by mouth once daily 6)  Vitamin B-12 1000 Mcg Tabs (Cyanocobalamin) .... One tablet by mouth once daily 7)  Lomotil 2.5-0.025 Mg Tabs (Diphenoxylate-atropine) .... Take 1 tab every 6 hours as needed for  diarrhea 8)  Align Caps (Probiotic product) .... One capsule by mouth once daily 9)  Centrum Silver Tabs (Multiple vitamins-minerals) .... One tablet once daily 10)  Nascobal 500 Mcg/0.85ml Soln (Cyanocobalamin) .Marland Kitchen.. 1 spray in one nostril once a week (alternate sides weekly ) 11)  Prothesis For Right Breast  .... Cup size 36-b 12)  Prothesis Bra  .... Cup size 36-b  Other Orders: UA Dipstick w/o Micro (automated)  (81003) T-Urine Microscopic ([id number]) T-Culture, Urine ([id number])  Laboratory Results   Urine Tests    Routine Urinalysis   Color: lt. yellow Appearance: Clear Glucose: negative   (Normal Range: Negative) Bilirubin: negative   (Normal Range: Negative) Ketone: negative   (Normal Range: Negative) Spec. Gravity: 1.025   (Normal Range: 1.003-1.035) Blood: negative   (Normal Range: Negative) pH: 6.0   (Normal Range: 5.0-8.0) Protein: negative   (Normal Range: Negative) Urobilinogen: 0.2   (Normal Range: 0-1) Nitrite: negative   (Normal Range: Negative) Leukocyte Esterace: negative   (Normal Range: Negative)    Comments: Army Fossa CMA  September 25, 2009 10:56 AM

## 2010-05-24 ENCOUNTER — Encounter: Payer: Self-pay | Admitting: Internal Medicine

## 2010-05-29 ENCOUNTER — Encounter: Payer: Self-pay | Admitting: Internal Medicine

## 2010-05-29 ENCOUNTER — Ambulatory Visit (INDEPENDENT_AMBULATORY_CARE_PROVIDER_SITE_OTHER): Payer: Medicare Other | Admitting: Internal Medicine

## 2010-05-29 DIAGNOSIS — E785 Hyperlipidemia, unspecified: Secondary | ICD-10-CM

## 2010-05-29 DIAGNOSIS — I10 Essential (primary) hypertension: Secondary | ICD-10-CM

## 2010-05-29 DIAGNOSIS — M3390 Dermatopolymyositis, unspecified, organ involvement unspecified: Secondary | ICD-10-CM

## 2010-05-29 DIAGNOSIS — M3313 Other dermatomyositis without myopathy: Secondary | ICD-10-CM | POA: Insufficient documentation

## 2010-05-29 DIAGNOSIS — R197 Diarrhea, unspecified: Secondary | ICD-10-CM | POA: Insufficient documentation

## 2010-05-29 DIAGNOSIS — Z0000 Encounter for general adult medical examination without abnormal findings: Secondary | ICD-10-CM

## 2010-05-29 DIAGNOSIS — E119 Type 2 diabetes mellitus without complications: Secondary | ICD-10-CM

## 2010-05-29 DIAGNOSIS — I6529 Occlusion and stenosis of unspecified carotid artery: Secondary | ICD-10-CM

## 2010-05-29 DIAGNOSIS — I6350 Cerebral infarction due to unspecified occlusion or stenosis of unspecified cerebral artery: Secondary | ICD-10-CM

## 2010-05-29 DIAGNOSIS — M810 Age-related osteoporosis without current pathological fracture: Secondary | ICD-10-CM

## 2010-05-29 DIAGNOSIS — R739 Hyperglycemia, unspecified: Secondary | ICD-10-CM | POA: Insufficient documentation

## 2010-05-29 DIAGNOSIS — I714 Abdominal aortic aneurysm, without rupture: Secondary | ICD-10-CM

## 2010-05-29 HISTORY — DX: Type 2 diabetes mellitus without complications: E11.9

## 2010-05-29 LAB — LIPID PANEL
HDL: 50.3 mg/dL (ref >39.00)
Total CHOL/HDL Ratio: 4
Triglycerides: 98 mg/dL (ref 0.0–149.0)
VLDL: 19.6 mg/dL (ref 0.0–40.0)

## 2010-05-29 LAB — CBC WITH DIFFERENTIAL/PLATELET
Basophils Absolute: 0 10*3/uL (ref 0.0–0.1)
HCT: 37.9 % (ref 36.0–46.0)
Lymphs Abs: 2.3 10*3/uL (ref 0.7–4.0)
MCV: 97.4 fl (ref 78.0–100.0)
Monocytes Absolute: 0.6 10*3/uL (ref 0.1–1.0)
Neutrophils Relative %: 68.2 % (ref 43.0–77.0)
Platelets: 240 10*3/uL (ref 150.0–400.0)
RDW: 14.7 % — ABNORMAL HIGH (ref 11.5–14.6)

## 2010-05-29 LAB — HEMOGLOBIN A1C: Hgb A1c MFr Bld: 5.9 % (ref 4.6–6.5)

## 2010-05-29 LAB — BASIC METABOLIC PANEL
Calcium: 9.4 mg/dL (ref 8.4–10.5)
Creatinine, Ser: 1 mg/dL (ref 0.4–1.2)
GFR: 58.51 mL/min — ABNORMAL LOW (ref >60.00)

## 2010-05-29 LAB — ALT: ALT: 11 U/L (ref 0–35)

## 2010-05-29 LAB — TSH: TSH: 2.23 u[IU]/mL (ref 0.35–5.50)

## 2010-05-29 MED ORDER — ASPIRIN EC 325 MG PO TBEC: 325.0000 mg | DELAYED_RELEASE_TABLET | Freq: Every day | ORAL | Status: DC

## 2010-05-29 NOTE — Assessment & Plan Note (Addendum)
Has been doing very well on current meds but she suspected that Aggrenox is causing diarrhea and would like to switch to aspirin. I reluctantly agreed to do that. See "diarrhea"

## 2010-05-29 NOTE — Assessment & Plan Note (Signed)
Well-controlled except for occasional high numbers. labs

## 2010-05-29 NOTE — Assessment & Plan Note (Signed)
She saw vascular surgery in 2010, she was recommended to repeat a ultrasound to check her AAA  around 2013

## 2010-05-29 NOTE — Assessment & Plan Note (Signed)
Patient aware of the diagnoses. Labs

## 2010-05-29 NOTE — Assessment & Plan Note (Addendum)
She saw vascular surgery in 2010, her ultrasound showed a 60-79% on the left at 40-59% on the right Repeated at or just ultrasound/2000 and show with 60-79% obstruction bilaterally. They have recommended no internal unless the stenosis is more than 80% Next ultrasound per Dr. Darrick Penna

## 2010-05-29 NOTE — Assessment & Plan Note (Signed)
Very distressed about this issue Today reports sx started (initially slowly) after she was put on aggrenox after a stroke. Per UTD 13% of people experience diarrhea as a s/e  Wonders if she could stop agrrenox, I'm reluctant to do so b/c h/o stroke but pt is distressed enough w/ diarrhea that i agreed to switch temporarily to ASA 325 and reassses in 6 weeks . Pt is willing to take the risk of increased stroke risk  by doing this change

## 2010-05-29 NOTE — Assessment & Plan Note (Addendum)
has refused bone density tests consistently. No recent bone density test. Recommend calcium daily vitamin D. Check vitamin D levels

## 2010-05-29 NOTE — Progress Notes (Signed)
  Subjective:    Patient ID: Crystal Osborne, female    DOB: [birth year], 77 y.o.   MRN: [id number]  HPI CPX, complex medical history reviewed Still has constipation , diarrhea, s/p extensive eval by GI; occ sees fresh blood in the toilette paper , occ dark stools Saw gynecology 9-11, had a female exam, Dx w/ prolapsed rectum (rx observation) HTN--  good medication compliance, BPs wnl, occ 160 reading   Past Medical History  Diagnosis Date  . Hyperlipidemia   . Breast cancer 1960  . Osteoporosis 2003  . CVA (cerebrovascular accident) 2006  . Hypertension   . Transient hypothyroidism     d/t thyroidectomy  . AAA (abdominal aortic aneurysm)     u/s done 10/11/08, next in 3 years  . B12 deficiency   . Shingles     repeated episone 9/11 went to a UC   Past Surgical History  Procedure Date  . Mastectomy, radical     right, [place] (60s)  . Abdominal hysterectomy     no oophorectomy  . Thyroidectomy     History   Social History  . Marital Status: Single    Spouse Name: N/A    Number of Children: 3  . Years of Education: N/A   Occupational History  . retired     Social History Main Topics  . Smoking status: Current Everyday Smoker -- 0.5 packs/day  . Smokeless tobacco: Not on file  . Alcohol Use: No  . Drug Use: No  . Sexually Active: Not on file   Other Topics Concern  . Not on file   Social History Narrative   Has 3 children in [place].....lives by herself.Marland KitchenMarland KitchenMarland KitchenHas a sister that lives in the [place], [place]....Daily caffeine use ...Marland KitchenMarland KitchenExercise- walking 1 mile a day, yard work   Family History  Problem Relation Age of Onset  . Cancer      sister (lung) , brother (mouth)  . Colon cancer Neg Hx   . Coronary artery disease Neg Hx   . Diabetes type II Neg Hx      Review of Systems  Constitutional: Negative for fever, fatigue and unexpected weight change.  Respiratory: Negative for cough and shortness of breath.   Cardiovascular: Negative for chest pain, palpitations  and leg swelling.  Gastrointestinal: Negative for nausea, vomiting and abdominal pain.  Psychiatric/Behavioral:       No anxiety, depression       Objective:   Physical Exam  Constitutional: She is oriented to person, place, and time. She appears well-developed and well-nourished.  Neck:       No  thyromegaly, carotid pulse palpable bilaterally, question of a bruit on the left.  Cardiovascular: Normal rate, regular rhythm and normal heart sounds.   No murmur heard. Pulmonary/Chest: Effort normal. No respiratory distress. She has no wheezes. She has no rales.       Slightly decreased breath sounds bilaterally  Abdominal: Soft. Bowel sounds are normal. She exhibits no distension. There is no tenderness. There is no rebound and no guarding.       Barely palpable aorta in the epigastric area, no bruit, no tenderness  Musculoskeletal: She exhibits no edema.  Neurological: She is alert and oriented to person, place, and time.  Psychiatric: She has a normal mood and affect. Her behavior is normal. Judgment and thought content normal.          Assessment & Plan:

## 2010-05-29 NOTE — Assessment & Plan Note (Addendum)
Td  3-10 pneumonia shot 02-2007 Had a shingles shot  Cscope 12-06, sigmoidoscopy 11-2008, Dr. Jarold Motto last [facility] --> ?. refuses further mammograms hysterectomy , remotely; had several normal PAPs afterwards; not interested in one  Tobacco, counseling

## 2010-05-29 NOTE — Assessment & Plan Note (Signed)
Due for labs

## 2010-05-30 LAB — VITAMIN D 25 HYDROXY (VIT D DEFICIENCY, FRACTURES): Vit D, 25-Hydroxy: 50 ng/mL (ref 30–89)

## 2010-06-03 ENCOUNTER — Telehealth: Payer: Self-pay | Admitting: *Deleted

## 2010-06-03 NOTE — Telephone Encounter (Signed)
Message copied by Army Fossa on Mon Jun 03, 2010  2:26 PM ------      Message from: Willow Ora      Created: Mon Jun 03, 2010  2:24 PM       Advise patient:      Her cholesterol used to be slightly better. She is currently taking Lipitor 20 mg half tablet daily. Recommend to take one tablet every day.      sodium is slightly high, recommend to drink plenty of water.      All other labs wnl.      Followup as planned

## 2010-06-03 NOTE — Telephone Encounter (Signed)
I spoke w/ pt she is aware.  

## 2010-06-25 NOTE — Procedures (Signed)
DUPLEX ULTRASOUND OF ABDOMINAL AORTA   INDICATION:  Followup known abdominal aortic aneurysm.   HISTORY:  Diabetes:  No  Cardiac:  No  Hypertension:  Yes  Smoking:  Yes  Connective Tissue Disorder:  Family History:  Previous Surgery:   DUPLEX EXAM:         AP (cm)                   TRANSVERSE (cm)  Proximal             2.23 cm                   2.29 cm  Mid                  2.58 cm                   2.69 cm  Distal               1.86 cm                   1.75 cm  Right Iliac          1.12 cm                   1.09 cm  Left Iliac           1.14 cm                   0.96 cm   PREVIOUS:  Date:  AP:  TRANSVERSE:   IMPRESSION:  Abdominal aortic aneurysm noted with the largest  measurement of 2.58 x 2.69-cm.   ___________________________________________  P. Liliane Bade, M.D.   MG/MEDQ  D:  09/30/2006  T:  10/01/2006  Job:  [id number]

## 2010-06-25 NOTE — Procedures (Signed)
DUPLEX ULTRASOUND OF ABDOMINAL AORTA   INDICATION:  Follow up abdominal aortic aneurysm.   HISTORY:  Diabetes:  No.  Cardiac:  No.  Hypertension:  Yes.  Smoking:  Yes.  Connective Tissue Disorder:  Family History:  Previous Surgery:   DUPLEX EXAM:         AP (cm)                   TRANSVERSE (cm)  Proximal             2.16 cm                   2.23 cm  Mid                  2.76 cm                   2.70 cm  Distal               2.16 cm                   2.23 cm  Right Iliac          0.86 cm                   0.88 cm  Left Iliac           0.73 cm                   0.81 cm   PREVIOUS:  Date:  AP:  2.58  TRANSVERSE:  2.69   IMPRESSION:  Abdominal aortic aneurysm/dilatation noted with the largest  measurement of 2.76 X 2.70 cm.   ___________________________________________  P. Liliane Bade, M.D.   MG/MEDQ  D:  10/12/2007  T:  10/12/2007  Job:  [id number]

## 2010-06-25 NOTE — Procedures (Signed)
RENAL ARTERY DUPLEX EVALUATION   INDICATION:  Uncontrolled hypertension.   HISTORY:  Diabetes:  No  Cardiac:  No  Hypertension:  Yes  Smoking:  Yes   RENAL ARTERY DUPLEX FINDINGS:  Aorta-Proximal:  97 cm/s  Aorta-Mid:  64 cm/s  Aorta-Distal:  151 cm/s  Celiac Artery Origin:  296 cm/s  SMA Origin:  154 cm/s                                    RIGHT               LEFT  Renal Artery Origin:             110 cm/s            91 cm/s  Renal Artery Proximal:           142 cm/s            98 cm/s  Renal Artery Mid:                113 cm/s            108 cm/s  Renal Artery Distal:             87 cm/s             125 cm/s  Hilar Acceleration Time (AT):      m/s2                m/s2  Renal-Aortic Ratio (RAR):        1.4                 1.2  Kidney Size:                     10.03 cm            11.32 cm  End Diastolic Ratio (EDR):  Resistive Index (RI):            0.72                0.69   IMPRESSION:  1. No evidence of renal artery stenosis noted bilaterally.  2. Bilateral kidneys measured within normal limits.  Right measured      10.03, and the left measured 11.32.  Normal resisted indices noted      bilaterally.  3. Increased velocities noted at the origin of the celiac artery.   INCIDENTAL FINDINGS:  Abdominal aortic aneurysm noted (with the largest  measurement of 2.50 cm x 2.48 cm).   ___________________________________________  P. Liliane Bade, M.D.   MG/MEDQ  D:  09/29/2006  T:  09/30/2006  Job:  [id number]

## 2010-06-25 NOTE — Procedures (Signed)
CAROTID DUPLEX EXAM   INDICATION:  Follow-up known carotid artery disease.   HISTORY:  Diabetes:  No  Cardiac:  No  Hypertension:  Yes  Smoking:  Yes  Previous Surgery:  No  CV History:  No  Amaurosis Fugax No, Paresthesias No, Hemiparesis No                                       RIGHT             LEFT  Brachial systolic pressure:         134               136  Brachial Doppler waveforms:         Biphasic          Biphasic  Vertebral direction of flow:        Antegrade         Antegrade  DUPLEX VELOCITIES (cm/sec)  CCA peak systolic                   85                98  ECA peak systolic                   289               242  ICA peak systolic                   187               254  ICA end diastolic                   36                69  PLAQUE MORPHOLOGY:                  Heterogeneous     Heterogeneous  PLAQUE AMOUNT:                      Moderate          Moderate to severe  PLAQUE LOCATION:                    ICA and ECA       ICA and ECA    IMPRESSION:  1. A 60%-79% stenosis noted in the left internal carotid artery.  2. A 40%-59% stenosis noted in the right internal carotid artery.  3. Antegrade bilateral vertebral arteries.   ___________________________________________  V. Charlena Cross, MD   MG/MEDQ  D:  11/20/2008  T:  11/21/2008  Job:  [id number]

## 2010-06-25 NOTE — Procedures (Signed)
DUPLEX ULTRASOUND OF ABDOMINAL AORTA   INDICATION:  Followup aortic dilatation.   HISTORY:  Diabetes:  No.  Cardiac:  No.  Hypertension:  Yes.  Smoking:  Yes.  Connective Tissue Disorder:  Family History:  No.  Previous Surgery:  No.   DUPLEX EXAM:         AP (cm)                   TRANSVERSE (cm)  Proximal             1.91 cm                   1.93 cm  Mid                  2.77 cm                   2.75 cm  Distal               1.49 cm                   1.6 cm  Right Iliac          0.66 cm                   cm  Left Iliac           0.63 cm                   cm   PREVIOUS:  Date:  10/12/2007  AP:  2.76  TRANSVERSE:  2.70   IMPRESSION:  1. Stable abdominal aortic dilatation measuring 2.77 cm x 2.75 cm.  2. Note:  Due to stable small caliber Dr. Darrick Penna stated to recheck in      3 years.   ___________________________________________  Janetta Hora Fields, MD   AS/MEDQ  D:  10/11/2008  T:  10/11/2008  Job:  [id number]   cc:   Willow Ora, MD

## 2010-06-25 NOTE — Procedures (Signed)
CAROTID DUPLEX EXAM   INDICATION:  Carotid artery disease.   HISTORY:  Diabetes:  No.  Cardiac:  No.  Hypertension:  Yes.  Smoking:  Yes.  Previous Surgery:  History of right mastectomy and thyroid surgeries.  CV History:  The patient states she had a CVA in 2006.  Amaurosis Fugax No, Paresthesias No, Hemiparesis No                                       RIGHT             LEFT  Brachial systolic pressure:  Brachial Doppler waveforms:  Vertebral direction of flow:        Antegrade         Antegrade  DUPLEX VELOCITIES (cm/sec)  CCA peak systolic                   91                109  ECA peak systolic                   244               219  ICA peak systolic                   211               202  ICA end diastolic                   49                47  PLAQUE MORPHOLOGY:                  Mixed             Mixed  PLAQUE AMOUNT:                      Moderate          Moderate  PLAQUE LOCATION:                    ICA / ECA         ICA / ECA / CCA   IMPRESSION:  1. Doppler velocities suggest high end 40%-59% stenoses of the      bilateral proximal internal carotid arteries.  2. Bilateral external carotid artery stenoses noted.  3. The bilateral vertebral arteries are patent and antegrade.  4. No significant change in Doppler velocities of the bilateral      internal carotid arteries when compared to the previous exam on      05/31/2009.   ___________________________________________  Di Kindle. Edilia Bo, M.D.   CH/MEDQ  D:  01/18/2010  T:  01/18/2010  Job:  [id number]

## 2010-06-25 NOTE — Assessment & Plan Note (Signed)
OFFICE VISIT   Crystal Osborne, Crystal Osborne  DOB:  December 24, 1931                                       01/08/2009  EAVWU#:[id number]   REFERRING PHYSICIAN:  Willow Ora, MD.   REASON FOR VISIT:  Carotid disease and aortic aneurysm.   HISTORY:  This is a 77 year old female seen at the request of Dr. Drue Novel  for evaluation of carotid artery occlusive disease and abdominal aortic  aneurysm.  The patient has a history of having had a stroke in 2006.  Her symptoms were left leg and arm weakness.  She has been treated  medically for this.  The patient also suffers from hypertension, which  she is on 4 medicines for.  She has had a renal artery ultrasound which  was negative for renal artery stenosis.  She does have  hypercholesterolemia.  She is on 10 mg of Lipitor.  She is unable to  tolerate any increases in this.  She does continue to smoke  approximately a half pack every 2 to 3 days.   The patient denies any symptoms of acute stroke or TIA.  Specifically,  she denies having numbness or weakness in either extremity, any slurring  of her speech or amaurosis fugax.   REVIEW OF SYSTEMS:  Positive for a history of a mini stroke.  All other  review of systems are negative, as documented in the encounter form.   PAST MEDICAL HISTORY:  Hyperlipidemia, history of breast cancer,  osteoporosis, history of stroke, hypertension, transient hypothyroidism,  status post thyroidectomy, small abdominal aortic aneurysm.   PAST SURGICAL HISTORY:  Right radical mastectomy, hysterectomy,  thyroidectomy.   FAMILY HISTORY:  Negative for cardiovascular disease at an early age.   SOCIAL HISTORY:  She is divorced with 3 children.  She is retired.  Currently smokes a half pack of cigarettes every 2 to 3 days.  Does not  drink alcohol.   ALLERGIES:  None.   MEDICATIONS:  Please see medical record.   PHYSICAL EXAMINATION:  Heart rate 62, blood pressure 117/72, temperature  is 98.0.  GENERAL:  She  is well-appearing in no distress.  HEAD:  Normocephalic, atraumatic.  EENT:  Pupils are equal.  Sclerae are  anicteric.  Lungs are clear with nonlabored respirations.  CARDIOVASCULAR:  Regular rate and rhythm.  No murmur.  ABDOMEN:  Soft,  nontender.  MUSCULOSKELETAL:  Without major deformities.  Neurologically, she has a nonfocal exam.  Skin is without rash.  PSYCH:  Normal affect.  NECK:  No carotid bruits.   DIAGNOSTIC STUDIES:  I have independently reviewed her ultrasound.  Her  carotid ultrasound reveals 60% to 79% stenosis on the left, 40% to 59%  stenosis on the right.   Abdominal ultrasound reveals a 2.7 x 2.7 abdominal aortic aneurysm.   PLAN:  1. Carotid occlusive disease:  I told the patient that we would wait      until she became symptomatic or developed stenosis greater than 80%      before we would recommend operative intervention on her carotid      occlusive disease.  She has been managed appropriately from a      medical perspective and she is on a statin.  Her blood pressure      well controlled.  I discussed with her smoking cessation.  2. Abdominal aortic aneurysm:  Her  aneurysm is very small.  This      likely will not ever cause her a problem.  However, the ultrasound      should be repeated in 2 to 3 years.   Jorge Ny, MD  Electronically Signed   VWB/MEDQ  D:  01/08/2009  T:  01/08/2009  Job:  [id number]   cc:   Willow Ora, MD

## 2010-06-28 NOTE — H&P (Signed)
NAME:  Crystal Osborne, Crystal Osborne                 ACCOUNT NO.:  [id number]   MEDICAL RECORD NO.:  [id number]          PATIENT TYPE:  EMS   LOCATION:  MINO                         FACILITY:  [facility]   PHYSICIAN:  Genene Churn. Love, M.D.    DATE OF BIRTH:  04-05-1931   DATE OF ADMISSION:  02/17/2004  DATE OF DISCHARGE:                                HISTORY & PHYSICAL   This is the first [facility] admission for this 77 year old right-  handed white divorced female who lives alone. She was admitted from the  emergency room for evaluation of dizziness and left leg weakness.   HISTORY OF PRESENT ILLNESS:  Crystal Osborne has a long history of hyperlipidemia  for which she has tried multiple drugs and had side effects with rash,  throat swelling, and muscle pain. These drug include Lipitor, Zocor,  Crestor, and Welchol. She has been on Zetia and one aspirin per day. This  morning, she awoke with vague lightheaded dizzy sensation followed by left  leg weakness which cleared in approximately 1 hour. There was no associated  headache, chest pain, or palpitations. Her symptoms recurred about 4:30 p.m.  She took three aspirin and came to the emergency room. She discussed this  with her doctor, Dr. Ruthine Dose and symptoms improved in the emergency room. She  has had one fall today without apparent injury.   PAST MEDICAL HISTORY:  1.  A 50 year history of cigarette use, about a pack per day.  2.  She has had a right mastectomy for breast cancer 40 years ago.  3.  She had a thyroidectomy in 1965.  4.  History of hyperlipidemia.   CURRENT MEDICATIONS:  1.  Zetia 10 mg once daily.  2.  Aspirin one p.o. once daily.   ALLERGIES:  STATIN DRUGS AND SHE STATES THAT CODEINE CAUSES HEADACHES.   SOCIAL HISTORY:  She smokes one pack per day of cigarettes which she has  done for 50 years. She does not drink any alcohol. She is educated through  high school, finished 3 years of college, and works part-time at [employer]. She is divorced and has three sons, 81, 109, and 20,  living and well.   FAMILY HISTORY:  Her mother died at 96 from congestive heart failure. Her  father died at 83 from congestive heart failure. She has one brother who  died at 62 from cancer. She has another brother who died at 9 from  myocardial infarction, another brother died at 63 of unknown causes. She has  one sister who died at 43 from brain cancer. She has another sister, living  and well. She has another sister 43, living and well.   PHYSICAL EXAMINATION:  GENERAL:  Well-developed, kyphotic, white female.  VITAL SIGNS:  Blood pressure right and left arm was 220/90, heart rate was  64 and regular. There were no bruits.  MENTAL STATUS:  She was alert and oriented x3. 1/3 stepped commands.  NEUROLOGIC:  Her cranial nerve examination revealed visual fields full,  disks flat, extraocular movements full, corneals present. There was  no  seventh nerve palsy. Hearing was intact. Air conduction was greater than  bone conduction. Tongue was midline. The uvula was midline and gags were  present. Sternocleidomastoid and trapezius testing were normal. Motor  examination revealed 5/5 strength in the upper and lower extremities.  Coordination test revealed finger-to-nose, heel-to-shin, rapid alternating  movements to be intact. Sensory examination was intact to pinprick,  position, and vibration testing. Deep tendon reflexes were 1+ and plantar  responses were downgoing.  HEENT:  Revealed the tympanic membranes to be clear.  LUNGS:  Good breath sounds.  HEART:  Without murmurs.  ABDOMEN:  Bowel sounds were normal. She is status post right mastectomy. She  had no enlargement of liver, spleen, or kidneys.  MUSCULOSKELETAL:  There was no cyanosis, clubbing, or edema.   LABORATORY DATA:  Revealed a CT scan showing old versus subacute right  frontal stroke plus an old versus subacute right anterior cerebral artery  stroke  with an old left basal ganglia ischemic stroke. Lumbar spine x-ray  showed DJD at T12-L1 and L5-S1. Hips were okay. EKG showed left atrial  enlargement and normal sinus rhythm. Sodium was 143, potassium 3.7, chloride  110, BUN 14, glucose 88. Hemoglobin 13, hematocrit 38.5, white blood cell  count 8700, platelets 275,000. Protime 13.1, INR 1.0, PTT 33.   IMPRESSION:  1.  Right brain transient ischemic attacks x2. Code 435.9.  2.  Bicerebral strokes, right anterior cerebral artery stroke and left basal      ganglia stroke. Code 433.31.  3.  Chronic obstructive pulmonary disease. Code 496.  4.  Hyperlipidemia. Code 272.4.  5.  New onset hypertension. Code 796.2.  6.  History of breast cancer. Code unknown.   PLAN:  Admit the patient for further evaluation.       JML/MEDQ  D:  02/17/2004  T:  02/17/2004  Job:  [id number]

## 2010-06-28 NOTE — Discharge Summary (Signed)
NAME:  Crystal Osborne, Crystal Osborne                 ACCOUNT NO.:  [id number]   MEDICAL RECORD NO.:  [id number]          PATIENT TYPE:  INP   LOCATION:  3020                         FACILITY:  [facility]   PHYSICIAN:  Pramod P. Pearlean Brownie, MD    DATE OF BIRTH:  [birth year]   DATE OF ADMISSION:  02/17/2004  DATE OF DISCHARGE:                                 DISCHARGE SUMMARY   DIAGNOSES AT TIME OF DISCHARGE:  1.  Small right posterior basal ganglia infarct.  2.  Hyperlipidemia.  3.  A 50-year history of cigarette smoking.  4.  Right mastectomy for breast cancer 40 years ago.  5.  Thyroidectomy 1965.  6.  Asymptomatic urinary tract infection.   MEDICINES AT DISCHARGE:  1.  Zetia 10 mg daily.  2.  Aggrenox one p.o. daily x14 days then increase to b.i.d.   STUDIES PERFORMED:  1.  MRI of the brain shows a small right posterior basal ganglia infarct.  2.  MRA of the head is negative.  3.  CT on admission shows mild small vessel ischemic disease, no acute      abnormality.  4.  Lumbar spine shows degenerative disc disease T12 to L1 and L5 to S1.  No      acute abnormality; left hip x-ray is negative; and pelvic x-ray is      negative.  5.  Carotid Doppler shows bilateral 40-60% stenosis high on mid-high end of      the scale on the right and high end of the scale on left.  There is some      left ECA stenosis.  6.  Transcranial Dopplers performed, results pending.  7.  A 2-D echocardiogram performed, results pending.   LABORATORY STUDIES:  CBC normal, differential normal.  Coags normal.  Hemoglobin A1c normal.  Cholesterol 190, triglycerides 88, HDL 46, and LDL  126.  UA shows positive nitrites with moderate leukocyte esterase, 3-6 red  blood cells, 3-6 white blood cells, many epithelial cells, and many  bacteria.  EKG to show normal sinus rhythm with left atrial enlargement and  nonspecific ST and T wave abnormalities.   HISTORY OF PRESENT ILLNESS:  Ms. Crystal Osborne is a 77 year old right-handed  white  female with a history of dyslipidemia who awoke Saturday morning with  a dizzy sensation followed by left leg weakness which cleared in  approximately 1 hour.  Her symptoms recurred about 4:30 p.m. and she  presented to the emergency room.  She was not a tPA or ST2 candidate  secondary to quick resolution of symptoms.  She was admitted to the hospital  for further workup.   MRI did reveal an acute infarct in small right posterior ganglia area.  The  patient was on aspirin prior to admission and so was changed to Aggrenox for  secondary stroke prevention.  The patient is a cigarette smoker and has been  advised to stop.  Risk factor of dyslipidemia is being treated.  She cannot  tolerate a statin due to past history of allergic reactions including rash,  throat swelling, and muscle pain.  There was bilateral carotid stenosis  which was known to the patient prior to admission.  No other risk factors  were identified.  The patient will be discharged home.  She does live along  but the family will stay with her for a few days.  Follow up with Dr. Pearlean Brownie  in 2-3 months and follow up with Dr. Ruthine Dose in 1 month.   CONDITION AT DISCHARGE:  Neurologically intact.   DISCHARGE PLAN:  1.  Discharge home with family.  No therapy needs.  2.  Aggrenox for secondary stroke prevention.  Some concern about being able      to afford medicine but will call if she is unable.  3.  Follow up with Dr. Pearlean Brownie in 2-3 months.  4.  Follow up with Dr. Ruthine Dose in 1 month.       SB/MEDQ  D:  02/19/2004  T:  02/19/2004  Job:  [id number]   cc:   Angelena Sole, M.D. [name]

## 2010-07-01 ENCOUNTER — Other Ambulatory Visit: Payer: Self-pay | Admitting: Internal Medicine

## 2010-07-22 ENCOUNTER — Encounter: Payer: Self-pay | Admitting: Internal Medicine

## 2010-07-22 ENCOUNTER — Ambulatory Visit (INDEPENDENT_AMBULATORY_CARE_PROVIDER_SITE_OTHER): Payer: Medicare Other | Admitting: Internal Medicine

## 2010-07-22 DIAGNOSIS — R197 Diarrhea, unspecified: Secondary | ICD-10-CM

## 2010-07-22 DIAGNOSIS — I10 Essential (primary) hypertension: Secondary | ICD-10-CM

## 2010-07-22 DIAGNOSIS — E785 Hyperlipidemia, unspecified: Secondary | ICD-10-CM

## 2010-07-22 NOTE — Progress Notes (Signed)
  Subjective:    Patient ID: Crystal Osborne, female    DOB: [birth year], 77 y.o.   MRN: [id number]  HPI  Followup from the last office visit. She discontinue Aggrenox, diarrhea has decreased tremendously. She has a diarrhea episode every one or 2 weeks as opposed to several episodes weekly. We also increased Lipitor, good medication compliance, question of a increase in nocturnal leg cramps.  Past Medical History  Diagnosis Date  . Hyperlipidemia   . Breast cancer 1960  . Osteoporosis 2003  . CVA (cerebrovascular accident) 2006  . Hypertension   . Transient hypothyroidism     d/t thyroidectomy  . AAA (abdominal aortic aneurysm)     u/s done 10/11/08, next in 3 years  . B12 deficiency   . Shingles     repeated episone 9/11 went to a UC   Past Surgical History  Procedure Date  . Mastectomy, radical     right, remotley (60s)  . Abdominal hysterectomy     no oophorectomy  . Thyroidectomy      Review of Systems No nausea or vomiting No blood in the stools Occasional constipation now, takes over-the-counter Metamucil. Her sodium was high the last time, she is drinking plenty of fluids.     Objective:   Physical Exam  Constitutional: She is oriented to person, place, and time. She appears well-developed and well-nourished.  Cardiovascular: Normal rate, regular rhythm and normal heart sounds.   No murmur heard. Pulmonary/Chest: Effort normal and breath sounds normal. No respiratory distress.  Musculoskeletal: She exhibits no edema.  Neurological: She is alert and oriented to person, place, and time.  Psychiatric: She has a normal mood and affect. Her behavior is normal. Judgment and thought content normal.          Assessment & Plan:

## 2010-07-22 NOTE — Assessment & Plan Note (Signed)
Last Na  was a slight increase. Labs

## 2010-07-22 NOTE — Assessment & Plan Note (Signed)
Diarrhea is resolving after she discontinue Aggrenox. Patient feels a lot better.

## 2010-07-22 NOTE — Patient Instructions (Signed)
Came back fasting within few days: FLP dx high chol BMP--dx HTN Try OTC quinine tablets at night to help with cramps, you may also like to try "CO Q 10 " OTC   call if the cramps are severe

## 2010-07-22 NOTE — Assessment & Plan Note (Addendum)
Lipitor dose increased based on the last cholesterol panel, good compliance. Slight increase in nocturnal leg cramps, I recommend quinine and/or CO Q10 over-the-counter. labs

## 2010-07-23 ENCOUNTER — Other Ambulatory Visit: Payer: Self-pay | Admitting: Internal Medicine

## 2010-07-24 ENCOUNTER — Other Ambulatory Visit: Payer: Self-pay | Admitting: Internal Medicine

## 2010-07-24 DIAGNOSIS — E785 Hyperlipidemia, unspecified: Secondary | ICD-10-CM

## 2010-07-24 DIAGNOSIS — I10 Essential (primary) hypertension: Secondary | ICD-10-CM

## 2010-07-25 ENCOUNTER — Other Ambulatory Visit (INDEPENDENT_AMBULATORY_CARE_PROVIDER_SITE_OTHER): Payer: Medicare Other

## 2010-07-25 DIAGNOSIS — I10 Essential (primary) hypertension: Secondary | ICD-10-CM

## 2010-07-25 DIAGNOSIS — E785 Hyperlipidemia, unspecified: Secondary | ICD-10-CM

## 2010-07-25 LAB — LIPID PANEL
Cholesterol: 183 mg/dL (ref 0–200)
VLDL: 21.8 mg/dL (ref 0.0–40.0)

## 2010-07-25 LAB — BASIC METABOLIC PANEL
CO2: 27 mEq/L (ref 19–32)
Chloride: 109 mEq/L (ref 96–112)
Potassium: 5 mEq/L (ref 3.5–5.1)
Sodium: 143 mEq/L (ref 135–145)

## 2010-07-25 NOTE — Progress Notes (Signed)
Labs only

## 2010-07-26 ENCOUNTER — Telehealth: Payer: Self-pay | Admitting: *Deleted

## 2010-07-26 MED ORDER — ATORVASTATIN CALCIUM 40 MG PO TABS: 40.0000 mg | ORAL_TABLET | Freq: Every day | ORAL | Status: DC

## 2010-07-26 NOTE — Telephone Encounter (Signed)
Message copied by Leanne Lovely on Fri Jul 26, 2010  9:47 AM ------      Message from: Crystal Osborne      Created: Thu Jul 25, 2010  7:36 PM       (in 2009 was on zetia, it was d/c d/t nausea)      Advise patient:      Cholesterol has not improved, increase lipitor to 40mg  daily, call a new Rx.      Pt to call if has s/Osborne , we could  change to crestor 20mg  qhs       Other labs wnl

## 2010-07-26 NOTE — Telephone Encounter (Signed)
Pt is aware.  

## 2010-10-29 ENCOUNTER — Encounter: Payer: Self-pay | Admitting: Internal Medicine

## 2010-10-29 ENCOUNTER — Ambulatory Visit (INDEPENDENT_AMBULATORY_CARE_PROVIDER_SITE_OTHER): Payer: Medicare Other | Admitting: Internal Medicine

## 2010-10-29 DIAGNOSIS — E785 Hyperlipidemia, unspecified: Secondary | ICD-10-CM

## 2010-10-29 DIAGNOSIS — M25519 Pain in unspecified shoulder: Secondary | ICD-10-CM

## 2010-10-29 DIAGNOSIS — N329 Bladder disorder, unspecified: Secondary | ICD-10-CM

## 2010-10-29 MED ORDER — TRAMADOL HCL 50 MG PO TABS: 50.0000 mg | ORAL_TABLET | Freq: Three times a day (TID) | ORAL | Status: DC | PRN

## 2010-10-29 NOTE — Assessment & Plan Note (Addendum)
Ongoing nocturia, was escalated by Dr. Algie Coffer, gynecology [phone]. They did a trial without pessary which the patient couldn't tolerate They were thinking about doing a urodynamic study as well as postvoid residual. Not sure if that was ever done. Will prescribe VESIcare, samples provided, see instructions

## 2010-10-29 NOTE — Patient Instructions (Signed)
For 3 weeks, take half of the Lipitor and see if that help with your shoulder. Will refer you to anorthopedic doctor Try VESIcare 5 mg at bedtime, see if that helps with the bladder and let me know

## 2010-10-29 NOTE — Progress Notes (Signed)
  Subjective:    Patient ID: Crystal Osborne, female    DOB: [birth year], 77 y.o.   MRN: [id number]  HPI  Acute visit Sudden onset of right shoulder pain approximately 4 weeks ago, pain is on-and-off, may or may not be exacerbated or triggered by moving her arm. Denies any injury Denies any neck pain Shoulder pain does not radiate No fever or chills.  Cholesterol--good compliance with Lipitor 40 mg tablet, dose was increased a few months ago base on the  cholesterol panel. Wonders if increased cholesterol medication and has to do with the pain.  Continue with severe nocturia. Review of Systems See history of present illness    Objective:   Physical Exam  Constitutional: She appears well-developed and well-nourished. No distress.  Cardiovascular: Normal rate, regular rhythm and normal heart sounds.   No murmur heard. Pulmonary/Chest: Effort normal. No respiratory distress. She has no wheezes. She has rales.  Musculoskeletal: She exhibits no edema.       Shoulders symmetric. Left shoulder normal. Right shoulder without apparent deformity, no swelling or warmth. Range of motion is adequate, some pain with arm elevation.  Skin: She is not diaphoretic.          Assessment & Plan:  Today , I spent more than 25 min with the patient, >50% of the time counseling, and  reviewing the chart in reference to cholesterol and nocturia

## 2010-10-29 NOTE — Assessment & Plan Note (Addendum)
Chart is reviewed, she has chronic right shoulder pain, today she reports an increase in the pain for the last 3-4 weeks. Pain used to be well-controlled with Darvocet, now that medication is no longer available Plan: Ortho referral Doubt related to statins but will decrease dose. See "cholesterol" Needs something for pain, does not care much about Vicodin or oxycodone, has not tried Ultram. Will prescribe it on a trial bases

## 2010-10-29 NOTE — Assessment & Plan Note (Addendum)
Increased shoulder pain with higher doses of Lipitor ? Chart reviewed, in the past zetia caused some nausea and GI side effects  Plan: Decrease Lipitor 40 mg to half tablet daily for 3 weeks to see if there is any benefit (decreaased shoulder pain)

## 2010-12-02 ENCOUNTER — Other Ambulatory Visit: Payer: Self-pay | Admitting: Internal Medicine

## 2010-12-03 NOTE — Telephone Encounter (Signed)
Done

## 2010-12-10 ENCOUNTER — Other Ambulatory Visit: Payer: Medicare Other

## 2010-12-10 NOTE — Progress Notes (Signed)
12  

## 2010-12-18 ENCOUNTER — Ambulatory Visit (INDEPENDENT_AMBULATORY_CARE_PROVIDER_SITE_OTHER): Payer: Medicare Other | Admitting: Internal Medicine

## 2010-12-18 ENCOUNTER — Encounter: Payer: Self-pay | Admitting: Internal Medicine

## 2010-12-18 DIAGNOSIS — N329 Bladder disorder, unspecified: Secondary | ICD-10-CM

## 2010-12-18 DIAGNOSIS — Z23 Encounter for immunization: Secondary | ICD-10-CM

## 2010-12-18 DIAGNOSIS — I10 Essential (primary) hypertension: Secondary | ICD-10-CM

## 2010-12-18 DIAGNOSIS — M25519 Pain in unspecified shoulder: Secondary | ICD-10-CM

## 2010-12-18 DIAGNOSIS — D518 Other vitamin B12 deficiency anemias: Secondary | ICD-10-CM

## 2010-12-18 DIAGNOSIS — E119 Type 2 diabetes mellitus without complications: Secondary | ICD-10-CM

## 2010-12-18 DIAGNOSIS — E785 Hyperlipidemia, unspecified: Secondary | ICD-10-CM

## 2010-12-18 DIAGNOSIS — E039 Hypothyroidism, unspecified: Secondary | ICD-10-CM

## 2010-12-18 MED ORDER — TRAMADOL HCL 50 MG PO TABS: 50.0000 mg | ORAL_TABLET | Freq: Three times a day (TID) | ORAL | Status: DC | PRN

## 2010-12-18 MED ORDER — BENAZEPRIL HCL 20 MG PO TABS: 20.0000 mg | ORAL_TABLET | Freq: Two times a day (BID) | ORAL | Status: DC

## 2010-12-18 MED ORDER — ATORVASTATIN CALCIUM 40 MG PO TABS: 40.0000 mg | ORAL_TABLET | Freq: Every day | ORAL | Status: DC

## 2010-12-18 MED ORDER — FELODIPINE ER 10 MG PO TB24: 10.0000 mg | ORAL_TABLET | Freq: Every day | ORAL | Status: DC

## 2010-12-18 MED ORDER — METOPROLOL TARTRATE 50 MG PO TABS: 50.0000 mg | ORAL_TABLET | Freq: Two times a day (BID) | ORAL | Status: DC

## 2010-12-18 NOTE — Assessment & Plan Note (Signed)
Labs

## 2010-12-18 NOTE — Assessment & Plan Note (Signed)
Well controlled, labs  

## 2010-12-18 NOTE — Assessment & Plan Note (Signed)
Bladder and rectal prolapse, still an issue, she was evaluated before by gynecology, pessary did not work, was not offer surgery. Recommend observation.

## 2010-12-18 NOTE — Patient Instructions (Signed)
Please came back fasting: FLP AST ALT--- dx high cholesterol BMP-- dx HTN TSH-- dx hypothyroidism

## 2010-12-18 NOTE — Progress Notes (Signed)
  Subjective:    Patient ID: Crystal Osborne, female    DOB: 09/05/1931, 77 y.o.   MRN: [id number]  HPI Routine office visit, needed a refill on her medicines. Hypertension--good medication compliance Hyperlipidemia ---good medication compliance, Lipitor was increased from 20-40 mg daily. No apparent side effects  Past Medical History  Diagnosis Date  . Hyperlipidemia   . Breast cancer 1960  . Osteoporosis 2003  . CVA (cerebrovascular accident) 2006  . Hypertension   . Transient hypothyroidism     d/t thyroidectomy  . AAA (abdominal aortic aneurysm)     u/s done 10/11/08, next in 3 years  . B12 deficiency   . Shingles     repeated episone 9/11 went to a UC      Review of Systems Doing well in general, still has issues with rectal-bladder  prolapse, has seen several specialists,she used a  pessary but didn't work, sawy gynecology and she was not offered  surgery. No blood in the stools.     Objective:   Physical Exam  Constitutional: She is oriented to person, place, and time. She appears well-developed and well-nourished. No distress.  Cardiovascular: Normal rate, regular rhythm and normal heart sounds.   No murmur heard. Pulmonary/Chest: Effort normal. No respiratory distress. She has no wheezes. She has no rales.  Musculoskeletal: She exhibits no edema.  Neurological: She is alert and oriented to person, place, and time.  Skin: She is not diaphoretic.  Psychiatric: She has a normal mood and affect. Her behavior is normal. Judgment and thought content normal.          Assessment & Plan:

## 2010-12-18 NOTE — Assessment & Plan Note (Addendum)
Patient surprised when i mention she has DM, I told the patient again she has "mild" or "borderline"  DM

## 2010-12-18 NOTE — Assessment & Plan Note (Signed)
Due for a  B12 check

## 2010-12-18 NOTE — Assessment & Plan Note (Signed)
increased lipitor from 20 to 40 based on last FLP, good tolerance, labs

## 2010-12-20 ENCOUNTER — Other Ambulatory Visit: Payer: Self-pay | Admitting: Internal Medicine

## 2010-12-20 DIAGNOSIS — E785 Hyperlipidemia, unspecified: Secondary | ICD-10-CM

## 2010-12-20 DIAGNOSIS — I10 Essential (primary) hypertension: Secondary | ICD-10-CM

## 2010-12-20 DIAGNOSIS — E039 Hypothyroidism, unspecified: Secondary | ICD-10-CM

## 2010-12-23 ENCOUNTER — Other Ambulatory Visit (INDEPENDENT_AMBULATORY_CARE_PROVIDER_SITE_OTHER): Payer: Medicare Other

## 2010-12-23 DIAGNOSIS — D518 Other vitamin B12 deficiency anemias: Secondary | ICD-10-CM

## 2010-12-23 DIAGNOSIS — E785 Hyperlipidemia, unspecified: Secondary | ICD-10-CM

## 2010-12-23 DIAGNOSIS — E039 Hypothyroidism, unspecified: Secondary | ICD-10-CM

## 2010-12-23 DIAGNOSIS — I10 Essential (primary) hypertension: Secondary | ICD-10-CM

## 2010-12-23 NOTE — Progress Notes (Signed)
12  

## 2010-12-24 LAB — ALT: ALT: 16 U/L (ref 0–35)

## 2010-12-24 LAB — BASIC METABOLIC PANEL
CO2: 21 mEq/L (ref 19–32)
Chloride: 113 mEq/L — ABNORMAL HIGH (ref 96–112)
Glucose, Bld: 84 mg/dL (ref 70–99)
Potassium: 5.2 mEq/L — ABNORMAL HIGH (ref 3.5–5.1)
Sodium: 148 mEq/L — ABNORMAL HIGH (ref 135–145)

## 2010-12-24 LAB — LIPID PANEL
Cholesterol: 151 mg/dL (ref 0–200)
LDL Cholesterol: 81 mg/dL (ref 0–99)
Triglycerides: 127 mg/dL (ref 0.0–149.0)

## 2010-12-24 LAB — VITAMIN B12: Vitamin B-12: 240 pg/mL (ref 211–911)

## 2010-12-30 ENCOUNTER — Telehealth: Payer: Self-pay

## 2010-12-30 NOTE — Telephone Encounter (Signed)
Message copied by Beverely Low on Mon Dec 30, 2010 11:21 AM ------      Message from: Willow Ora E      Created: Mon Dec 30, 2010  8:10 AM       Advise patient:      Cholesterol has improve significantly !      B12 normal      Potassium borderline elevated, needs a low potassium diet, if she has not received one already, please send one.      Continue same medicines as before. Good results

## 2010-12-30 NOTE — Telephone Encounter (Signed)
Left message for pt to call back and mailed copy of labs

## 2011-01-06 ENCOUNTER — Telehealth: Payer: Self-pay

## 2011-01-06 NOTE — Telephone Encounter (Signed)
Pt aware of lab results and low K+ form mailed along with another copy of labs

## 2011-01-22 ENCOUNTER — Telehealth: Payer: Self-pay | Admitting: Internal Medicine

## 2011-01-22 MED ORDER — FELODIPINE ER 10 MG PO TB24: 10.0000 mg | ORAL_TABLET | Freq: Every day | ORAL | Status: DC

## 2011-01-22 MED ORDER — METOPROLOL TARTRATE 50 MG PO TABS: 50.0000 mg | ORAL_TABLET | Freq: Two times a day (BID) | ORAL | Status: DC

## 2011-01-22 MED ORDER — BENAZEPRIL HCL 20 MG PO TABS: 20.0000 mg | ORAL_TABLET | Freq: Two times a day (BID) | ORAL | Status: DC

## 2011-01-22 NOTE — Telephone Encounter (Signed)
Patient wants to know why she only got 30 day supply of felodipine - benazepril - metoprolol - she said she usually gets 90 day supply -  [facility] [address]

## 2011-01-22 NOTE — Telephone Encounter (Addendum)
Rx resent for 90 day supply Pt aware.

## 2011-06-11 DIAGNOSIS — N390 Urinary tract infection, site not specified: Secondary | ICD-10-CM

## 2011-06-11 HISTORY — DX: Urinary tract infection, site not specified: N39.0

## 2011-07-01 ENCOUNTER — Other Ambulatory Visit: Payer: Self-pay | Admitting: Internal Medicine

## 2011-07-01 NOTE — Telephone Encounter (Signed)
Refill done.  

## 2011-07-25 ENCOUNTER — Ambulatory Visit (INDEPENDENT_AMBULATORY_CARE_PROVIDER_SITE_OTHER): Payer: Medicare Other | Admitting: Internal Medicine

## 2011-07-25 ENCOUNTER — Encounter: Payer: Self-pay | Admitting: Internal Medicine

## 2011-07-25 VITALS — BP 122/76 | HR 57 | Temp 98.2°F | Wt 131.0 lb

## 2011-07-25 DIAGNOSIS — R5381 Other malaise: Secondary | ICD-10-CM

## 2011-07-25 DIAGNOSIS — R197 Diarrhea, unspecified: Secondary | ICD-10-CM

## 2011-07-25 DIAGNOSIS — R5383 Other fatigue: Secondary | ICD-10-CM

## 2011-07-25 DIAGNOSIS — N390 Urinary tract infection, site not specified: Secondary | ICD-10-CM

## 2011-07-25 DIAGNOSIS — R0789 Other chest pain: Secondary | ICD-10-CM

## 2011-07-25 DIAGNOSIS — R4182 Altered mental status, unspecified: Secondary | ICD-10-CM

## 2011-07-25 DIAGNOSIS — R531 Weakness: Secondary | ICD-10-CM

## 2011-07-25 DIAGNOSIS — T7840XA Allergy, unspecified, initial encounter: Secondary | ICD-10-CM

## 2011-07-25 LAB — POCT URINALYSIS DIPSTICK
Ketones, UA: NEGATIVE
pH, UA: 5

## 2011-07-25 MED ORDER — ALPRAZOLAM 0.25 MG PO TABS: 0.2500 mg | ORAL_TABLET | Freq: Every evening | ORAL | Status: DC | PRN

## 2011-07-25 NOTE — Patient Instructions (Addendum)
Drink plenty of fluids Good nutrition, Ensure? Alprazolam low dose at night, if you get oversedated or you have any problems do not take it again. Please come back in 2 weeks Please call if the diarrhea is not improving in the next 2-3 days, call anytime if you have fever, chills, stomach cramps or blood in the stools.

## 2011-07-25 NOTE — Progress Notes (Signed)
Subjective:    Patient ID: Crystal Osborne, female    DOB: Sep 17, 1931, 78 y.o.   MRN: [id number]  HPI Acute visit, here with her sister Johnny Bridge. About 2 weeks ago she was seen at the urgent care in [facility]. Diagnosed with a UTI and diverticulitis, was prescribed Cipro and Flagyl, she took antibiotics for 2 days but then discontinue them due to nausea. ~ 12 days later  she went to an urgent care in [facility], diagnosed with a UTI, took Septra for one week.  5 days ago, developed severe generalized itching and went to the emergency room in [facility], she was admitted: --BP was elevated, EKG was abnormal, saw cardiology , had a ECHO, they also did a stress test which was reportedly negative. Her BP was quite elevated --She complained of a right-sided pain: Shoulders, right chest, right flank, right hip. Pain was described as a cramp, on and off. Not sure what workup was done regards to his pain. --Developed hallucinations while in the hospital that required hold old. --Was evaluated by PT, recommend to use a cane.  Before the visit was over  We got records from the hospital: Admitted 07/17/2011. Had a generalized pruritic is without a rash. She developed delirium, CT of the head showed no acute changes. Cardiology performed a echocardiogram, EF 55%, mild mitral regurgitation. Stress test was indeed negative. Chest x-ray showed COPD and atelectasis versus minimal infiltrate at the left base. Hemoglobin 13.8, before discharge 12.3. Creatinine 0.8, LFTs normal, cholesterol 152, LDL 90  Past Medical History  Diagnosis Date  . Hyperlipidemia   . Breast cancer 1960  . Osteoporosis 2003  . CVA (cerebrovascular accident) 2006  . Hypertension   . Transient hypothyroidism     d/t thyroidectomy  . AAA (abdominal aortic aneurysm)     u/s done 10/11/08, next in 3 years  . B12 deficiency   . Shingles     repeated episone 9/11 went to a UC  . Diabetes  mellitus 05/29/2010    A1c 6.0 on October 2011   . CAROTID ARTERY DISEASE 11/28/2008    Qualifier: Diagnosis of  By: Shary Decamp     Past Surgical History  Procedure Date  . Mastectomy, radical     right, remotley (60s)  . Abdominal hysterectomy     no oophorectomy  . Thyroidectomy    History   Social History  . Marital Status: Single    Spouse Name: N/A    Number of Children: 3  . Years of Education: N/A   Occupational History  . retired     Social History Main Topics  . Smoking status: Current Everyday Smoker -- 0.5 packs/day    Types: Cigarettes  . Smokeless tobacco: Former Neurosurgeon  . Alcohol Use: No  . Drug Use: No  . Sexually Active: Not on file   Other Topics Concern  . Not on file   Social History Narrative   Has 3 children in [place]---lives by herself---- still drives--Has a sister that lives in the [place], [place], [place], [place]....Daily caffeine use---Exercise- walking 1 mile a day, yard work      Review of Systems Since he left the hospital 2 days ago, she's not feeling well: Decreased appetite, unable to sleep well, fatigue. Continue with pain at the right lateral chest, described as a cramp. The pain is not worse with arm rotation, deep breaths or by touching that area No pain  at the right shoulder or flank.  No abdominal pain. Denies substernal chest pain, shortness of breath, cough. No nausea or vomiting but has developed diarrhea for the last 3 days, stools are watery unknown bloody    Objective:   Physical Exam General -- alert, well-developed, and well-nourished.  Looks tired but not toxic. Not pale or jaundice Lungs -- normal respiratory effort, no intercostal retractions, no accessory muscle use, and normal breath sounds.   Heart-- normal rate, regular rhythm, no murmur, and no gallop.   Abdomen--soft, non-tender, no distention, no masses Extremities-- no pretibial edema bilaterally, calves symmetric, soft, nontender. Range of motion of the  right arm normal. Skin--  no rash noted at the abdomen, lateral -posterior chest Neurologic-- alert & oriented X3 and strength normal in all extremities. Psych-- Cognition and judgment appear intact. Alert and cooperative with normal attention span and concentration.  not anxious appearing and not depressed appearing.      Assessment & Plan:   Presents with the following issues: Recently diagnosed with a UTI and diverticulitis. Status post Cipro, Flagyl, Septra. Currently without abdominal pain. Allergic reaction  (generalized pruritic is without rash) due to Septra more likely than to Cipro Right-sided chest pain, recent stress test negative. Diarrhea for 2 or 3 days in the setting of recent antibiotic intake Recent hallucinations Generalized weakness, recent PT evaluation @ the hospital suggested a cane.  Assessment and plan: Urinalysis showed possible infection, we'll reculture the urine. Diarrhea, if not improving, will check a culture for C. difficile. Right-sided chest pain, improve compared to a few days ago, consider do a CT of the chest if not better. No tramadol Decreased appetite, fatigue, likely related to recent acute illness and hospitalization. Recommend rest, drink plenty of fluids, good nutrition. Insomnia, the patient strongly ask for sleeping aid. I'm concerned because a recent hallucinations, I told the patient my concern, she really needs help. I agreed to prescribe a low dose of alprazolam which in the past she take without problems. She needsb take the first dose tonight, her sister is with her and she could monitor her. If she gets oversedated or any reaction she won't take Xanax again. Return to the office in 2 weeks  F2F >> 45 min

## 2011-07-26 ENCOUNTER — Encounter: Payer: Self-pay | Admitting: Internal Medicine

## 2011-07-28 LAB — URINE CULTURE

## 2011-08-01 ENCOUNTER — Ambulatory Visit (INDEPENDENT_AMBULATORY_CARE_PROVIDER_SITE_OTHER): Payer: Medicare Other | Admitting: Internal Medicine

## 2011-08-01 ENCOUNTER — Telehealth: Payer: Self-pay | Admitting: Internal Medicine

## 2011-08-01 VITALS — BP 110/68 | HR 58 | Temp 97.9°F | Wt 131.0 lb

## 2011-08-01 DIAGNOSIS — R197 Diarrhea, unspecified: Secondary | ICD-10-CM

## 2011-08-01 DIAGNOSIS — H9209 Otalgia, unspecified ear: Secondary | ICD-10-CM

## 2011-08-01 DIAGNOSIS — G4700 Insomnia, unspecified: Secondary | ICD-10-CM

## 2011-08-01 NOTE — Telephone Encounter (Signed)
Pt is being see by Dr. Drue Novel today.

## 2011-08-01 NOTE — Telephone Encounter (Signed)
Caller: [place]/Patient; PCP: Willow Ora; CB#: [phone];  Call regarding Left Ear hurting on and off onset 07/31/11. Afebrile. No drainage. Hurts inside when she touches ear lobe. Triage and Care advice per Ear Sx Protocol and appnt advised within 24 hours for "pain when moving outer ear". Scheduled for today at 1530 -6/'21/13 with Dr. Drue Novel. Allergic To: Septra, Flagyl, and Cipro.

## 2011-08-01 NOTE — Progress Notes (Signed)
  Subjective:    Patient ID: Crystal Osborne, female    DOB: Sep 28, 1931, 78 y.o.   MRN: [id number]  HPI Acute visit Developed left ear ache yesterday, pain continue today, it is on and off, not worse by chewing. Denies any ear discharge or ear bleeding. Right year is unaffected.  Past Medical History  Diagnosis Date  . Hyperlipidemia   . Breast cancer 1960  . Osteoporosis 2003  . CVA (cerebrovascular accident) 2006  . Hypertension   . Transient hypothyroidism     d/t thyroidectomy  . AAA (abdominal aortic aneurysm)     u/s done 10/11/08, next in 3 years  . B12 deficiency   . Shingles     repeated episone 9/11 went to a UC  . Diabetes mellitus 05/29/2010    A1c 6.0 on October 2011   . CAROTID ARTERY DISEASE 11/28/2008    Qualifier: Diagnosis of  By: Shary Decamp     Past Surgical History  Procedure Date  . Mastectomy, radical     right, remotley (60s)  . Abdominal hysterectomy     no oophorectomy  . Thyroidectomy       Review of Systems No fever or chills No URI symptoms No headaches, no decreased hearing. He was also seen recently with several symptoms, diarrhea is resolved, energy is improving, still having difficulty sleeping with a low dose of Xanax. Diarrhea has improved    Objective:   Physical Exam Alert oriented x3, no apparent distress. She does seem to be a slightly stronger than before. HEENT, Right tympanic membrane slightly bulge otherwise normal Left ear symmetric with slightly bulging TM, canal normal, no discharge, no wax, negative trago sign, percussion of the left mastoid area did not produce pain. TMJs without a click Throat without redness, poor dentition, no dental pain with teeth percussion.    Assessment & Plan:   Otalgia: Etiology unclear, no evidence of ear infection, TMJ, dental infection. Declined a Rx to [facility]. Plan is observation. See instructions  Diarrhea, improved  Insomnia, not improving with low dose of alprazolam, recommend  to increase to 2 tablets each bedtime.

## 2011-08-01 NOTE — Patient Instructions (Addendum)
Take tylenol as needed for few days for ear ache Call if no better or if pain is severe

## 2011-08-03 ENCOUNTER — Encounter: Payer: Self-pay | Admitting: Internal Medicine

## 2011-08-12 ENCOUNTER — Ambulatory Visit (INDEPENDENT_AMBULATORY_CARE_PROVIDER_SITE_OTHER): Payer: Medicare Other | Admitting: Internal Medicine

## 2011-08-12 ENCOUNTER — Encounter: Payer: Self-pay | Admitting: Internal Medicine

## 2011-08-12 VITALS — BP 118/72 | HR 55 | Temp 97.8°F | Wt 130.0 lb

## 2011-08-12 DIAGNOSIS — G4700 Insomnia, unspecified: Secondary | ICD-10-CM | POA: Insufficient documentation

## 2011-08-12 MED ORDER — ALPRAZOLAM 0.25 MG PO TABS: 0.5000 mg | ORAL_TABLET | Freq: Every evening | ORAL | Status: DC | PRN

## 2011-08-12 MED ORDER — CITALOPRAM HYDROBROMIDE 20 MG PO TABS: 20.0000 mg | ORAL_TABLET | Freq: Every day | ORAL | Status: DC

## 2011-08-12 NOTE — Assessment & Plan Note (Signed)
Continue with difficulty sleeping since she left the hospital a couple weeks ago. By the way she describes her symptoms is almost like a PTSD type of disorder (at night she keeps thinking about the recent hospital admissions) Plan: Counseled here today, add citalopram, continue alprazolam. Hopefully, once citalopram "kicks in" she won't need benzodiazepines

## 2011-08-12 NOTE — Patient Instructions (Signed)
Citalopram 20 mg: 1/2tablet x 5 days at night , then 1 tablet nightly Ok to use xanax 1 at night, another later on if you wake up in the middle of the night

## 2011-08-12 NOTE — Progress Notes (Signed)
  Subjective:    Patient ID: Crystal Osborne, female    DOB: 1931/03/21, 78 y.o.   MRN: [id number]  HPI Followup from previous visits. Her main challenge remains difficulty sleeping, she did not have this problem before, currently taking up to 2 alprazolam at night and she keeps waking up in the middle of the night. Reports that is unable to sleep because she keeps thinking about her recent admission  to the hospital which was traumatic to her.  Past Medical History  Diagnosis Date  . Hyperlipidemia   . Breast cancer 1960  . Osteoporosis 2003  . CVA (cerebrovascular accident) 2006  . Hypertension   . Transient hypothyroidism     d/t thyroidectomy  . AAA (abdominal aortic aneurysm)     u/s done 10/11/08, next in 3 years  . B12 deficiency   . Shingles     repeated episone 9/11 went to a UC  . Diabetes mellitus 05/29/2010    A1c 6.0 on October 2011   . CAROTID ARTERY DISEASE 11/28/2008    Qualifier: Diagnosis of  By: Shary Decamp     Past Surgical History  Procedure Date  . Mastectomy, radical     right, remotley (60s)  . Abdominal hysterectomy     no oophorectomy  . Thyroidectomy      Review of Systems Recently seen with the ear ache that resolved. Denies diarrhea Denies UTI symptoms such as dysuria or gross hematuria Still fatigue, not back to baseline but slowly improving.     Objective:   Physical Exam General -- alert, well-developed. No apparent distress.  Neurologic-- alert & oriented X3 and strength normal in all extremities. Psych-- Cognition and judgment appear intact. Alert and cooperative with normal attention span and concentration.  not anxious appearing and not depressed appearing.       Assessment & Plan:  Seen about 2 weeks ago after hospital admission due to a severe allergic reaction, UTI, diverticulitis. Fortunately she seems to be recovering well.

## 2011-08-21 ENCOUNTER — Other Ambulatory Visit: Payer: Self-pay | Admitting: Internal Medicine

## 2011-08-21 MED ORDER — BENAZEPRIL HCL 20 MG PO TABS: 20.0000 mg | ORAL_TABLET | Freq: Two times a day (BID) | ORAL | Status: DC

## 2011-08-21 MED ORDER — METOPROLOL TARTRATE 50 MG PO TABS: 50.0000 mg | ORAL_TABLET | Freq: Two times a day (BID) | ORAL | Status: DC

## 2011-08-21 NOTE — Telephone Encounter (Signed)
Refill done.  

## 2011-08-21 NOTE — Telephone Encounter (Signed)
BENAZEPRIL HCL 20MG  TABLET QTY: 180 REFILLS WRITTEN:1 TAKE 1 TABLET BY MOUTH TWICE A DAY

## 2011-08-21 NOTE — Telephone Encounter (Signed)
METOPROLOL TARTRATE 50MG  TABLET QTY: 180 REFILLS: 1 TAKE 1 TABLET BY MOUTH TWICE A DAY

## 2011-08-26 ENCOUNTER — Other Ambulatory Visit: Payer: Self-pay | Admitting: Internal Medicine

## 2011-09-17 ENCOUNTER — Other Ambulatory Visit: Payer: Self-pay | Admitting: *Deleted

## 2011-09-17 DIAGNOSIS — I6529 Occlusion and stenosis of unspecified carotid artery: Secondary | ICD-10-CM

## 2011-09-17 DIAGNOSIS — I714 Abdominal aortic aneurysm, without rupture: Secondary | ICD-10-CM

## 2011-09-18 ENCOUNTER — Ambulatory Visit: Payer: Self-pay | Admitting: Vascular Surgery

## 2011-09-24 ENCOUNTER — Encounter: Payer: Self-pay | Admitting: Neurosurgery

## 2011-09-24 ENCOUNTER — Encounter (INDEPENDENT_AMBULATORY_CARE_PROVIDER_SITE_OTHER): Payer: Medicare Other | Admitting: *Deleted

## 2011-09-24 DIAGNOSIS — I714 Abdominal aortic aneurysm, without rupture: Secondary | ICD-10-CM

## 2011-09-25 ENCOUNTER — Other Ambulatory Visit (INDEPENDENT_AMBULATORY_CARE_PROVIDER_SITE_OTHER): Payer: Medicare Other | Admitting: *Deleted

## 2011-09-25 ENCOUNTER — Encounter: Payer: Self-pay | Admitting: Neurosurgery

## 2011-09-25 ENCOUNTER — Ambulatory Visit (INDEPENDENT_AMBULATORY_CARE_PROVIDER_SITE_OTHER): Payer: Medicare Other | Admitting: Neurosurgery

## 2011-09-25 VITALS — BP 134/66 | HR 53 | Resp 16 | Ht 67.0 in | Wt 128.8 lb

## 2011-09-25 DIAGNOSIS — I714 Abdominal aortic aneurysm, without rupture, unspecified: Secondary | ICD-10-CM

## 2011-09-25 DIAGNOSIS — I6529 Occlusion and stenosis of unspecified carotid artery: Secondary | ICD-10-CM

## 2011-09-25 NOTE — Addendum Note (Signed)
Addended by: Sharee Pimple on: 09/25/2011 04:52 PM   Modules accepted: Orders

## 2011-09-25 NOTE — Progress Notes (Signed)
[facility] Carotid Office Note  CC: Annual carotid duplex, AAA iliac duplex Referring Physician: Fields  History of Present Illness: 78 year old female patient of Dr. Darrick Penna followed for known carotid stenosis and a small abdominal aortic aneurysm. The patient states she has no unusual abdominal or back pain, she denies signs or symptoms of CVA, TIA, amaurosis fugax or any neural deficit. The patient denies any new medical diagnoses recent surgery.  Past Medical History  Diagnosis Date  . Hyperlipidemia   . Breast cancer 1960  . Osteoporosis 2003  . CVA (cerebrovascular accident) 2006  . Hypertension   . Transient hypothyroidism     d/t thyroidectomy  . AAA (abdominal aortic aneurysm)     u/s done 10/11/08, next in 3 years  . B12 deficiency   . Shingles     repeated episone 9/11 went to a UC  . Diabetes mellitus 05/29/2010    A1c 6.0 on October 2011   . CAROTID ARTERY DISEASE 11/28/2008    Qualifier: Diagnosis of  By: Shary Decamp    . UTI (lower urinary tract infection) May 2013    ROS: [x]  Positive   [ ]  Denies    General: [ ]  Weight loss, [ ]  Fever, [ ]  chills Neurologic: [ ]  Dizziness, [ ]  Blackouts, [ ]  Seizure [ ]  Stroke, [ ]  "Mini stroke", [ ]  Slurred speech, [ ]  Temporary blindness; [ ]  weakness in arms or legs, [ ]  Hoarseness Cardiac: [ ]  Chest pain/pressure, [ ]  Shortness of breath at rest [ ]  Shortness of breath with exertion, [ ]  Atrial fibrillation or irregular heartbeat Vascular: [ ]  Pain in legs with walking, [ ]  Pain in legs at rest, [ ]  Pain in legs at night,  [ ]  Non-healing ulcer, [ ]  Blood clot in vein/DVT,   Pulmonary: [ ]  Home oxygen, [ ]  Productive cough, [ ]  Coughing up blood, [ ]  Asthma,  [ ]  Wheezing Musculoskeletal:  [ ]  Arthritis, [ ]  Low back pain, [ ]  Joint pain Hematologic: [ ]  Easy Bruising, [ ]  Anemia; [ ]  Hepatitis Gastrointestinal: [ ]  Blood in stool, [ ]  Gastroesophageal Reflux/heartburn, [ ]  Trouble  swallowing Urinary: [ ]  chronic Kidney disease, [ ]  on HD - [ ]  MWF or [ ]  TTHS, [ ]  Burning with urination, [ ]  Difficulty urinating Skin: [ ]  Rashes, [ ]  Wounds Psychological: [ ]  Anxiety, [ ]  Depression   Social History History  Substance Use Topics  . Smoking status: Current Everyday Smoker -- 0.5 packs/day    Types: Cigarettes  . Smokeless tobacco: Former Neurosurgeon  . Alcohol Use: No    Family History Family History  Problem Relation Age of Onset  . Cancer      sister (lung) , brother (mouth)  . Colon cancer Neg Hx   . Coronary artery disease Neg Hx   . Diabetes type II Neg Hx     Allergies  Allergen Reactions  . Ciprofloxacin Hcl Itching  . Septra (Sulfamethoxazole-Tmp Ds) Itching    Current Outpatient Prescriptions  Medication Sig Dispense Refill  . atorvastatin (LIPITOR) 40 MG tablet Take 1 tablet (40 mg total) by mouth daily.  30 tablet  6  . benazepril (LOTENSIN) 20 MG tablet Take 1 tablet (20 mg total) by mouth 2 (two) times daily.  180 tablet  2  . citalopram (CELEXA) 20 MG tablet Take 1 tablet (20 mg total) by mouth daily.  30 tablet  2  . felodipine (PLENDIL) 10  MG 24 hr tablet take 1 tablet by mouth once daily  90 tablet  1  . metoprolol (LOPRESSOR) 50 MG tablet Take 1 tablet (50 mg total) by mouth 2 (two) times daily.  180 tablet  0  . Multiple Vitamins-Minerals (CENTRUM SILVER) CHEW Chew by mouth.        . psyllium (METAMUCIL) 58.6 % packet Take 1 packet by mouth daily.        Marland Kitchen RA ASPIRIN 325 MG tablet take 1 tablet by mouth once daily  100 tablet  1    Physical Examination  Filed Vitals:   09/25/11 1127  BP: 134/66  Pulse: 53  Resp: 16    Body mass index is 20.17 kg/(m^2).  General:  WDWN in NAD Gait: Normal HEENT: WNL Eyes: Pupils equal Pulmonary: normal non-labored breathing , without Rales, rhonchi,  wheezing Cardiac: RRR, without  Murmurs, rubs or gallops; Abdomen: soft, NT, no masses Skin: no rashes, ulcers noted  Vascular  Exam Pulses: 3+ radial pulses bilaterally, no abdominal mass is palpated Carotid bruits: Carotid pulses to auscultation no bruits are heard Extremities without ischemic changes, no Gangrene , no cellulitis; no open wounds;  Musculoskeletal: no muscle wasting or atrophy   Neurologic: A&O X 3; Appropriate Affect ; SENSATION: normal; MOTOR FUNCTION:  moving all extremities equally. Speech is fluent/normal  Non-Invasive Vascular Imaging CAROTID DUPLEX 09/25/2011  Right ICA 40 - 59 % stenosis Left ICA 40 - 59 % stenosis AAA duplex shows a maximum diameter of 2.7 with evidence of bilateral common iliac artery stenosis on the right greater than 50% on the left less than 50%  ASSESSMENT/PLAN: Asymptomatic patient with moderate bilateral carotid stenosis and small AAA. The patient return in one year for repeat AAA iliac duplex as well as carotid duplex. The patient's in agreement with this, her questions were encouraged and answered.  Lauree Chandler ANP   Clinic MD: Darrick Penna

## 2011-11-17 ENCOUNTER — Other Ambulatory Visit: Payer: Self-pay | Admitting: Internal Medicine

## 2011-11-17 NOTE — Telephone Encounter (Signed)
Refill done.  

## 2011-12-17 ENCOUNTER — Ambulatory Visit (INDEPENDENT_AMBULATORY_CARE_PROVIDER_SITE_OTHER): Payer: Medicare Other | Admitting: Internal Medicine

## 2011-12-17 ENCOUNTER — Encounter: Payer: Self-pay | Admitting: Internal Medicine

## 2011-12-17 VITALS — BP 114/68 | HR 56 | Temp 97.6°F | Wt 128.0 lb

## 2011-12-17 DIAGNOSIS — E785 Hyperlipidemia, unspecified: Secondary | ICD-10-CM

## 2011-12-17 DIAGNOSIS — E119 Type 2 diabetes mellitus without complications: Secondary | ICD-10-CM

## 2011-12-17 DIAGNOSIS — E039 Hypothyroidism, unspecified: Secondary | ICD-10-CM

## 2011-12-17 DIAGNOSIS — D518 Other vitamin B12 deficiency anemias: Secondary | ICD-10-CM

## 2011-12-17 DIAGNOSIS — I10 Essential (primary) hypertension: Secondary | ICD-10-CM

## 2011-12-17 DIAGNOSIS — G4700 Insomnia, unspecified: Secondary | ICD-10-CM

## 2011-12-17 DIAGNOSIS — I6529 Occlusion and stenosis of unspecified carotid artery: Secondary | ICD-10-CM

## 2011-12-17 LAB — LIPID PANEL
HDL: 40.1 mg/dL (ref >39.00)
Triglycerides: 109 mg/dL (ref 0.0–149.0)

## 2011-12-17 LAB — BASIC METABOLIC PANEL
Calcium: 8.9 mg/dL (ref 8.4–10.5)
GFR: 71.58 mL/min (ref >60.00)
Sodium: 140 mEq/L (ref 135–145)

## 2011-12-17 LAB — HEMOGLOBIN A1C: Hgb A1c MFr Bld: 6 % (ref 4.6–6.5)

## 2011-12-17 LAB — AST: AST: 23 U/L (ref 0–37)

## 2011-12-17 MED ORDER — ALPRAZOLAM 0.25 MG PO TABS: 0.5000 mg | ORAL_TABLET | Freq: Every evening | ORAL | Status: AC | PRN

## 2011-12-17 NOTE — Assessment & Plan Note (Signed)
Borderline diabetes, check the A1c

## 2011-12-17 NOTE — Assessment & Plan Note (Signed)
Good medication compliance, normal BP today, labs.

## 2011-12-17 NOTE — Assessment & Plan Note (Signed)
on no medications, TSH has been consistently normal

## 2011-12-17 NOTE — Assessment & Plan Note (Signed)
Takes supplements "sometimes", check labs

## 2011-12-17 NOTE — Progress Notes (Signed)
  Subjective:    Patient ID: Crystal Osborne, female    DOB: 05-03-1931, 78 y.o.   MRN: [id number]  HPI Followup from previous visit Insomnia, took citalopram, saw no major difference, currently taking only Xanax and doing well. Takes one half tablet as needed. Had PTSD like symptoms, they are better. High cholesterol, good medication compliance Hypertension, good medication compliance, not ambulatory BPs History of B12 deficiency, takes supplements "sometimes"  Past Medical History  Diagnosis Date  . Hyperlipidemia   . Breast cancer 1960  . Osteoporosis 2003  . CVA (cerebrovascular accident) 2006  . Hypertension   . Transient hypothyroidism     d/t thyroidectomy  . AAA (abdominal aortic aneurysm)     sees Dr Jettie Booze   . B12 deficiency   . Shingles     repeated episone 9/11 went to a UC  . Diabetes mellitus 05/29/2010    A1c 6.0 on October 2011   . CAROTID ARTERY DISEASE 11/28/2008    sees Dr Darrick Penna   . UTI (lower urinary tract infection) May 2013   Past Surgical History  Procedure Date  . Mastectomy, radical     right, remotley (60s)  . Abdominal hysterectomy     no oophorectomy  . Thyroidectomy    History   Social History  . Marital Status: Single    Spouse Name: N/A    Number of Children: 3  . Years of Education: N/A   Occupational History  . retired     Social History Main Topics  . Smoking status: Current Every Day Smoker -- 0.5 packs/day    Types: Cigarettes  . Smokeless tobacco: Never Used  . Alcohol Use: Yes     Comment: very rarely   . Drug Use: No  . Sexually Active: Not on file   Other Topics Concern  . Not on file   Social History Narrative   Has 3 children in [place] lives w/ her---- still drives----Has a sister that lives in the [place], [place], [place], [place]....Daily caffeine use---      Review of Systems No chest pain or shortness of breath No nausea, vomiting, diarrhea. Has usual aches and pains, nothing new or different      Objective:   Physical Exam General -- alert, well-developed, and well-nourished.    Lungs -- normal respiratory effort, no intercostal retractions, no accessory muscle use, and normal breath sounds.   Heart-- normal rate, regular rhythm, no murmur, and no gallop.   extremities-- no pretibial edema bilaterally Neurologic-- alert & oriented X3 and strength normal in all extremities. Psych-- Cognition and judgment appear intact. Alert and cooperative with normal attention span and concentration.  not anxious appearing and not depressed appearing.       Assessment & Plan:  Had a flu shot

## 2011-12-17 NOTE — Patient Instructions (Addendum)
Next visit in 3 months for a yearly checkup Take a over-the-counter B12 supplement every day

## 2011-12-17 NOTE — Assessment & Plan Note (Signed)
Good medication compliance, check labs. 

## 2011-12-17 NOTE — Assessment & Plan Note (Signed)
Reports she saw Dr. Darrick Penna few months ago for carotid artery disease and aortic aneurysm f/u

## 2011-12-17 NOTE — Assessment & Plan Note (Addendum)
At the last office visit, was prescribed citalopram, she took it for a month, did not help much, she self discontinued. At this point however, she is doing better, PTSD like symptoms have significantly decreased, still has some of issues going on but again she is managing ok. Taking Xanax as needed, so far is working well, sleeps ok most nights Plan: Refill Xanax when necessary

## 2011-12-22 ENCOUNTER — Encounter: Payer: Self-pay | Admitting: *Deleted

## 2012-01-22 ENCOUNTER — Other Ambulatory Visit: Payer: Self-pay | Admitting: Internal Medicine

## 2012-02-12 ENCOUNTER — Other Ambulatory Visit: Payer: Self-pay | Admitting: Internal Medicine

## 2012-02-13 NOTE — Telephone Encounter (Signed)
Refill done.  

## 2012-02-16 ENCOUNTER — Other Ambulatory Visit: Payer: Self-pay | Admitting: Internal Medicine

## 2012-02-16 NOTE — Telephone Encounter (Signed)
Refill done.  

## 2012-02-24 ENCOUNTER — Other Ambulatory Visit: Payer: Self-pay | Admitting: Internal Medicine

## 2012-02-24 ENCOUNTER — Other Ambulatory Visit: Payer: Self-pay | Admitting: *Deleted

## 2012-02-24 MED ORDER — ASPIRIN 325 MG PO TBEC: 325.0000 mg | DELAYED_RELEASE_TABLET | Freq: Every day | ORAL | Status: DC

## 2012-02-24 NOTE — Telephone Encounter (Signed)
Refill done.  

## 2012-03-11 ENCOUNTER — Other Ambulatory Visit: Payer: Self-pay | Admitting: Internal Medicine

## 2012-03-11 NOTE — Telephone Encounter (Signed)
Ok to refill? Last OV 11.6.13

## 2012-03-12 ENCOUNTER — Encounter: Payer: Self-pay | Admitting: *Deleted

## 2012-03-12 NOTE — Telephone Encounter (Signed)
Pt made aware rx & controlled substance contract is ready at front desk to be picked up.  

## 2012-03-12 NOTE — Telephone Encounter (Signed)
Done

## 2012-04-08 ENCOUNTER — Encounter: Payer: Self-pay | Admitting: *Deleted

## 2012-04-09 ENCOUNTER — Encounter: Payer: Self-pay | Admitting: Internal Medicine

## 2012-05-14 ENCOUNTER — Other Ambulatory Visit: Payer: Self-pay | Admitting: Internal Medicine

## 2012-05-14 NOTE — Telephone Encounter (Signed)
Refill done.  

## 2012-05-27 DIAGNOSIS — A799 Rickettsiosis, unspecified: Secondary | ICD-10-CM | POA: Insufficient documentation

## 2012-07-01 ENCOUNTER — Other Ambulatory Visit: Payer: Self-pay | Admitting: Internal Medicine

## 2012-07-01 NOTE — Telephone Encounter (Signed)
Refill done.  

## 2012-07-26 ENCOUNTER — Ambulatory Visit (INDEPENDENT_AMBULATORY_CARE_PROVIDER_SITE_OTHER): Payer: Medicare Other | Admitting: Internal Medicine

## 2012-07-26 ENCOUNTER — Encounter: Payer: Self-pay | Admitting: Internal Medicine

## 2012-07-26 VITALS — BP 120/82 | HR 56 | Temp 97.5°F | Wt 121.0 lb

## 2012-07-26 DIAGNOSIS — D229 Melanocytic nevi, unspecified: Secondary | ICD-10-CM

## 2012-07-26 DIAGNOSIS — F341 Dysthymic disorder: Secondary | ICD-10-CM

## 2012-07-26 DIAGNOSIS — D239 Other benign neoplasm of skin, unspecified: Secondary | ICD-10-CM

## 2012-07-26 DIAGNOSIS — F329 Major depressive disorder, single episode, unspecified: Secondary | ICD-10-CM

## 2012-07-26 NOTE — Assessment & Plan Note (Addendum)
Her son who she did not see for 40 years "moved in " with her , he had a substance abuse issues x years, s/p stroke, can not live independently.  obviosly she under a lot of stress physically and emotionally . Her son needs to be in a NH but the rest of the family is not interested in helping ,he doesn't have the resources to pay for it. The patient is counseled to the best of my ability, We discussed possible SSRIs but she is not interested. We talk about keep reaching to the family,avoiding feeling guilty, etc  Today , I spent more than 24 min with the patient, >50% of the time counseling

## 2012-07-26 NOTE — Progress Notes (Signed)
  Subjective:    Patient ID: Crystal Osborne, female    DOB: 1931/07/25, 79 y.o.   MRN: [id number]  HPI Acute visit Noted a mole 2 months ago located in the back, because is new and she can't see it she is concerned. Has 2 other skin lesions, see physical exam. Her main concern today was anxiety and depression. See a/p  Past Medical History  Diagnosis Date  . Hyperlipidemia   . Breast cancer 1960  . Osteoporosis 2003  . CVA (cerebrovascular accident) 2006  . Hypertension   . Transient hypothyroidism     d/t thyroidectomy  . AAA (abdominal aortic aneurysm)     sees Dr Jettie Booze   . B12 deficiency   . Shingles     repeated episone 9/11 went to a UC  . Diabetes mellitus 05/29/2010    A1c 6.0 on October 2011   . CAROTID ARTERY DISEASE 11/28/2008    sees Dr Darrick Penna   . UTI (lower urinary tract infection) May 2013   Past Surgical History  Procedure Laterality Date  . Mastectomy, radical      right, remotley (60s)  . Abdominal hysterectomy      no oophorectomy  . Thyroidectomy     History   Social History  . Marital Status: Single    Spouse Name: N/A    Number of Children: 3  . Years of Education: N/A   Occupational History  . retired     Social History Main Topics  . Smoking status: Current Every Day Smoker -- 0.50 packs/day    Types: Cigarettes  . Smokeless tobacco: Never Used  . Alcohol Use: Yes     Comment: very rarely   . Drug Use: No  . Sexually Active: Not on file   Other Topics Concern  . Not on file   Social History Narrative   Has 3 children in [place]   73 y/o son, disabled by stroke (09-2011) moved in w/ her 12-2011     still drives   Has a sister that lives in the [place], [place], [place], [place]....             Review of Systems No suicidal ideas    Objective:   Physical Exam  Constitutional: She appears well-developed and well-nourished.  Skin:     Psychiatric: Judgment and thought content normal.  Moderately anxious, depress, tearful        Assessment & Plan:

## 2012-07-26 NOTE — Assessment & Plan Note (Addendum)
Has a new mole in the back, definitely needs to see dermatology, likes to be refer to [place]. Will arrange

## 2012-08-16 ENCOUNTER — Other Ambulatory Visit: Payer: Self-pay | Admitting: Internal Medicine

## 2012-08-16 NOTE — Telephone Encounter (Signed)
Refill done.  

## 2012-09-06 ENCOUNTER — Ambulatory Visit (INDEPENDENT_AMBULATORY_CARE_PROVIDER_SITE_OTHER): Payer: Medicare Other | Admitting: Internal Medicine

## 2012-09-06 ENCOUNTER — Encounter: Payer: Self-pay | Admitting: Internal Medicine

## 2012-09-06 VITALS — BP 110/70 | HR 54 | Temp 97.6°F | Ht 65.25 in | Wt 120.0 lb

## 2012-09-06 DIAGNOSIS — Z0000 Encounter for general adult medical examination without abnormal findings: Secondary | ICD-10-CM

## 2012-09-06 DIAGNOSIS — I714 Abdominal aortic aneurysm, without rupture, unspecified: Secondary | ICD-10-CM

## 2012-09-06 DIAGNOSIS — I6529 Occlusion and stenosis of unspecified carotid artery: Secondary | ICD-10-CM

## 2012-09-06 DIAGNOSIS — E785 Hyperlipidemia, unspecified: Secondary | ICD-10-CM

## 2012-09-06 DIAGNOSIS — R197 Diarrhea, unspecified: Secondary | ICD-10-CM

## 2012-09-06 DIAGNOSIS — I10 Essential (primary) hypertension: Secondary | ICD-10-CM

## 2012-09-06 DIAGNOSIS — D518 Other vitamin B12 deficiency anemias: Secondary | ICD-10-CM

## 2012-09-06 DIAGNOSIS — E119 Type 2 diabetes mellitus without complications: Secondary | ICD-10-CM

## 2012-09-06 DIAGNOSIS — F341 Dysthymic disorder: Secondary | ICD-10-CM

## 2012-09-06 DIAGNOSIS — F329 Major depressive disorder, single episode, unspecified: Secondary | ICD-10-CM

## 2012-09-06 LAB — CBC WITH DIFFERENTIAL/PLATELET
Basophils Absolute: 0 10*3/uL (ref 0.0–0.1)
Eosinophils Relative: 1.6 % (ref 0.0–5.0)
HCT: 38.5 % (ref 36.0–46.0)
Hemoglobin: 12.7 g/dL (ref 12.0–15.0)
Lymphocytes Relative: 24.7 % (ref 12.0–46.0)
Monocytes Relative: 6.3 % (ref 3.0–12.0)
Neutro Abs: 6 10*3/uL (ref 1.4–7.7)
RBC: 4.08 Mil/uL (ref 3.87–5.11)
RDW: 14.7 % — ABNORMAL HIGH (ref 11.5–14.6)
WBC: 9 10*3/uL (ref 4.5–10.5)

## 2012-09-06 LAB — LIPID PANEL
LDL Cholesterol: 81 mg/dL (ref 0–99)
Total CHOL/HDL Ratio: 3
Triglycerides: 112 mg/dL (ref 0.0–149.0)

## 2012-09-06 LAB — BASIC METABOLIC PANEL
BUN: 17 mg/dL (ref 6–23)
CO2: 27 mEq/L (ref 19–32)
Calcium: 9 mg/dL (ref 8.4–10.5)
Chloride: 108 mEq/L (ref 96–112)
Creatinine, Ser: 1.1 mg/dL (ref 0.4–1.2)
Glucose, Bld: 94 mg/dL (ref 70–99)

## 2012-09-06 LAB — HEMOGLOBIN A1C: Hgb A1c MFr Bld: 6.2 % (ref 4.6–6.5)

## 2012-09-06 NOTE — Assessment & Plan Note (Signed)
Good medication compliance, labs 

## 2012-09-06 NOTE — Assessment & Plan Note (Signed)
Followup by Dr. Darrick Penna, she is due for a checkup but is unable to go at this point.

## 2012-09-06 NOTE — Progress Notes (Signed)
Subjective:    Patient ID: Crystal Osborne, female    DOB: Jun 16, 1931, 79 y.o.   MRN: [id number]  HPI Here for Medicare AWV: 1. Risk factors based on Past M, S, F history: reviewed 2. Physical Activities:  No routine exercise  3. Depression/mood:  PHQ 9-- #8 4. Hearing:  No problemss noted or reported  5. ADL's:  Completely independent  6. Fall Risk: no recent fall, prevention discussed  7. home Safety: does feel safe at home  8. Height, weight, &visual acuity: see VS, sees eye doctor, to have cataract sirgery soon 9. Counseling: provided 10. Labs ordered based on risk factors: if needed  11. Referral Coordination: if needed 12.  Care Plan, see assessment and plan  13.   Cognitive Assessment: cognition and motor skills appropriate for age   In addition, today we discussed the following: Aortic aneurysm, got a letter from Dr Darrick Penna, she's due for a followup, and this point she is unable to go, taking care of her son Hypertension, good medication compliance, no apparent side effects, no recent ambulatory BPs. BP today satisfactory High cholesterol, good medication compliance. B 12 def-- takes supplements sporadically   Past Medical History  Diagnosis Date  . Hyperlipidemia   . Breast cancer 1960  . Osteoporosis 2003  . CVA (cerebrovascular accident) 2006  . Hypertension   . Transient hypothyroidism     d/t thyroidectomy  . AAA (abdominal aortic aneurysm)     sees Dr Jettie Booze   . B12 deficiency   . Shingles     repeated episone 9/11 went to a UC  . Diabetes mellitus 05/29/2010    A1c 6.0 on October 2011   . CAROTID ARTERY DISEASE 11/28/2008    sees Dr Darrick Penna   . UTI (lower urinary tract infection) May 2013   Past Surgical History  Procedure Laterality Date  . Mastectomy, radical      right, remotley (60s)  . Abdominal hysterectomy      no oophorectomy  . Thyroidectomy     History   Social History  . Marital Status: Single    Spouse Name: N/A    Number of Children: 3   . Years of Education: N/A   Occupational History  . retired     Social History Main Topics  . Smoking status: Current Every Day Smoker -- 0.50 packs/day    Types: Cigarettes  . Smokeless tobacco: Never Used  . Alcohol Use: Yes     Comment: very rarely   . Drug Use: No  . Sexually Active: Not on file   Other Topics Concern  . Not on file   Social History Narrative   Has 3 children in [place]   38 y/o son, disabled by stroke (09-2011) moved in w/ her 12-2011     still drives   Has a sister that lives in the [place], [place] ( [place], [place] )           Family History  Problem Relation Age of Onset  . Cancer      sister (lung) , brother (mouth)  . Colon cancer Neg Hx   . Coronary artery disease Neg Hx   . Diabetes type II Neg Hx   . Breast cancer Neg Hx     Review of Systems Denies chest pain, shortness or breath.  No  nausea, vomiting, blood in the stools. He is still has on and off diarrhea for a while. No dysuria or gross hematuria. No suicidal ideas  Objective:   Physical Exam BP 110/70  Pulse 54  Temp(Src) 97.6 F (36.4 C) (Oral)  Ht 5' 5.25" (1.657 m)  Wt 120 lb (54.432 kg)  BMI 19.82 kg/m2  SpO2 97% General -- alert, well-developed, NAD.   Neck --no thyromegaly , + carotid pulses B, no bruit Lungs -- normal respiratory effort, no intercostal retractions, no accessory muscle use, and normal breath sounds.   Heart-- normal rate, regular rhythm, no murmur, and no gallop.   Abdomen--soft, non-tender, no distention, no masses, no HSM, no guarding, and no rigidity.   Extremities-- no pretibial edema bilaterally Neurologic-- alert & oriented X3 and strength normal in all extremities. Psych-- Cognition and judgment appear intact. Alert and cooperative with normal attention span and concentration.  not anxious appearing and mildly  depressed appearing.         Assessment & Plan:

## 2012-09-06 NOTE — Assessment & Plan Note (Signed)
Borderline diabetes, labs

## 2012-09-06 NOTE — Assessment & Plan Note (Signed)
Well controlled no change 

## 2012-09-06 NOTE — Assessment & Plan Note (Signed)
See previous entry, takes Xanax sporadically, I again discussed SSRIs. Patient reluctant to try

## 2012-09-06 NOTE — Assessment & Plan Note (Signed)
Due for a f/u w/ Dr Darrick Penna, unable to go at this time

## 2012-09-06 NOTE — Assessment & Plan Note (Addendum)
Due for labs , TAKES SUPLEMENTS SPORADICALLY

## 2012-09-06 NOTE — Patient Instructions (Addendum)
TAKE ALIGN, A OTC PROBIOTIC DAILY, CALL IF THE DIARRHEA IS NOT IMPROVING NEXT VISIT 4 MONTHS     Fall Prevention and Home Safety Falls cause injuries and can affect all age groups. It is possible to use preventive measures to significantly decrease the likelihood of falls. There are many simple measures which can make your home safer and prevent falls. OUTDOORS  Repair cracks and edges of walkways and driveways.  Remove high doorway thresholds.  Trim shrubbery on the main path into your home.  Have good outside lighting.  Clear walkways of tools, rocks, debris, and clutter.  Check that handrails are not broken and are securely fastened. Both sides of steps should have handrails.  Have leaves, snow, and ice cleared regularly.  Use sand or salt on walkways during winter months.  In the garage, clean up grease or oil spills. BATHROOM  Install night lights.  Install grab bars by the toilet and in the tub and shower.  Use non-skid mats or decals in the tub or shower.  Place a plastic non-slip stool in the shower to sit on, if needed.  Keep floors dry and clean up all water on the floor immediately.  Remove soap buildup in the tub or shower on a regular basis.  Secure bath mats with non-slip, double-sided rug tape.  Remove throw rugs and tripping hazards from the floors. BEDROOMS  Install night lights.  Make sure a bedside light is easy to reach.  Do not use oversized bedding.  Keep a telephone by your bedside.  Have a firm chair with side arms to use for getting dressed.  Remove throw rugs and tripping hazards from the floor. KITCHEN  Keep handles on pots and pans turned toward the center of the stove. Use back burners when possible.  Clean up spills quickly and allow time for drying.  Avoid walking on wet floors.  Avoid hot utensils and knives.  Position shelves so they are not too high or low.  Place commonly used objects within easy reach.  If  necessary, use a sturdy step stool with a grab bar when reaching.  Keep electrical cables out of the way.  Do not use floor polish or wax that makes floors slippery. If you must use wax, use non-skid floor wax.  Remove throw rugs and tripping hazards from the floor. STAIRWAYS  Never leave objects on stairs.  Place handrails on both sides of stairways and use them. Fix any loose handrails. Make sure handrails on both sides of the stairways are as long as the stairs.  Check carpeting to make sure it is firmly attached along stairs. Make repairs to worn or loose carpet promptly.  Avoid placing throw rugs at the top or bottom of stairways, or properly secure the rug with carpet tape to prevent slippage. Get rid of throw rugs, if possible.  Have an electrician put in a light switch at the top and bottom of the stairs. OTHER FALL PREVENTION TIPS  Wear low-heel or rubber-soled shoes that are supportive and fit well. Wear closed toe shoes.  When using a stepladder, make sure it is fully opened and both spreaders are firmly locked. Do not climb a closed stepladder.  Add color or contrast paint or tape to grab bars and handrails in your home. Place contrasting color strips on first and last steps.  Learn and use mobility aids as needed. Install an electrical emergency response system.  Turn on lights to avoid dark areas. Replace light bulbs that  burn out immediately. Get light switches that glow.  Arrange furniture to create clear pathways. Keep furniture in the same place.  Firmly attach carpet with non-skid or double-sided tape.  Eliminate uneven floor surfaces.  Select a carpet pattern that does not visually hide the edge of steps.  Be aware of all pets. OTHER HOME SAFETY TIPS  Set the water temperature for 120 F (48.8 C).  Keep emergency numbers on or near the telephone.  Keep smoke detectors on every level of the home and near sleeping areas. Document Released: 01/17/2002  Document Revised: 07/29/2011 Document Reviewed: 04/18/2011 [facility] Patient Information 2014 [place], [place].

## 2012-09-06 NOTE — Assessment & Plan Note (Addendum)
GI not from 11 2012 reviewed, see below. Patient continue with on and off diarrhea. Plan: OTC probiotics ---- GI Note 2011 As per my prior notes, she has responded nicely to treatment for bacterial overgrowth syndrome. Have asked continue probiotic therapy and a cholecystectomy she had a relapse of her problem would seem to respond to recent metronidazole therapy. She does have chronic diverticulosis and she is to continue a high-fiber diet as tolerated liberal p.o. fluids.

## 2012-09-06 NOTE — Assessment & Plan Note (Addendum)
Td  3-10 pneumonia shot 02-2007, Declined an additional shot 7 2014 Has a flu shot every year  Cscope 12-06, sigmoidoscopy 11-2008, Dr. Jarold Motto last MMG --> ?. refuses further mammograms hysterectomy , remotely; had several normal PAPs afterwards; not interested in one  Tobacco, counseling

## 2012-09-26 ENCOUNTER — Telehealth: Payer: Self-pay | Admitting: Internal Medicine

## 2012-09-27 NOTE — Telephone Encounter (Signed)
Ok RF, UDS at pt's earliest convenience

## 2012-09-27 NOTE — Telephone Encounter (Signed)
Spoke with patient, made aware of refill. Will stop by office this week to leave UDS.

## 2012-09-27 NOTE — Telephone Encounter (Signed)
Ok to refill Alprazolam? Last OV 7.28.14 Last filled 1.30.14 #60 and 1 refill Contract on file, Low risk Needs UDS

## 2012-09-29 ENCOUNTER — Telehealth: Payer: Self-pay | Admitting: Internal Medicine

## 2012-09-29 DIAGNOSIS — Z9011 Acquired absence of right breast and nipple: Secondary | ICD-10-CM

## 2012-09-29 MED ORDER — AMBULATORY NON FORMULARY MEDICATION: Status: DC

## 2012-09-29 NOTE — Telephone Encounter (Signed)
Please advise if ok to send new rx. Thanks.

## 2012-09-29 NOTE — Telephone Encounter (Addendum)
09/29/2012 - Per Pt Walk In Form, "Please I need a prescription for insurance for an updated prothesis for right side.  Please mail it to me at [address] [place], [place], [place] [address]".  Pt phone number is [phone].

## 2012-09-29 NOTE — Telephone Encounter (Signed)
rx printed and pt. Made aware available for pickup upon return to office to leave UDS today.

## 2012-09-29 NOTE — Telephone Encounter (Signed)
Please send Rx Right breast prosthesis ---- DX breast cancer

## 2012-09-30 ENCOUNTER — Other Ambulatory Visit: Payer: Medicare Other

## 2012-09-30 ENCOUNTER — Ambulatory Visit: Payer: Medicare Other | Admitting: Neurosurgery

## 2012-10-03 ENCOUNTER — Other Ambulatory Visit: Payer: Self-pay | Admitting: Internal Medicine

## 2012-10-04 ENCOUNTER — Other Ambulatory Visit: Payer: Self-pay | Admitting: *Deleted

## 2012-10-04 MED ORDER — FELODIPINE ER 10 MG PO TB24: ORAL_TABLET | ORAL | Status: DC

## 2012-10-04 NOTE — Telephone Encounter (Signed)
Rx refilled for felodipine 10mg .  Ag cma

## 2012-10-06 ENCOUNTER — Other Ambulatory Visit: Payer: Medicare Other

## 2012-10-18 ENCOUNTER — Other Ambulatory Visit: Payer: Self-pay | Admitting: Family Medicine

## 2012-11-08 ENCOUNTER — Encounter: Payer: Self-pay | Admitting: Internal Medicine

## 2012-11-16 ENCOUNTER — Other Ambulatory Visit: Payer: Self-pay | Admitting: Internal Medicine

## 2012-11-17 NOTE — Telephone Encounter (Signed)
rx refilled per protocol. DJR  

## 2012-11-18 ENCOUNTER — Encounter: Payer: Self-pay | Admitting: Internal Medicine

## 2013-01-06 ENCOUNTER — Other Ambulatory Visit: Payer: Self-pay | Admitting: Internal Medicine

## 2013-01-07 NOTE — Telephone Encounter (Signed)
RA Aspirin refilled per protocol

## 2013-02-13 ENCOUNTER — Other Ambulatory Visit: Payer: Self-pay | Admitting: Internal Medicine

## 2013-04-05 ENCOUNTER — Other Ambulatory Visit: Payer: Self-pay | Admitting: Internal Medicine

## 2013-05-04 ENCOUNTER — Other Ambulatory Visit: Payer: Self-pay | Admitting: Internal Medicine

## 2013-05-06 ENCOUNTER — Telehealth: Payer: Self-pay

## 2013-05-06 NOTE — Telephone Encounter (Signed)
CPE too soon. Rescheduled

## 2013-05-09 ENCOUNTER — Encounter: Payer: Medicare Other | Admitting: Internal Medicine

## 2013-05-12 ENCOUNTER — Other Ambulatory Visit: Payer: Self-pay | Admitting: Internal Medicine

## 2013-05-24 ENCOUNTER — Ambulatory Visit (INDEPENDENT_AMBULATORY_CARE_PROVIDER_SITE_OTHER): Payer: Medicare Other | Admitting: Internal Medicine

## 2013-05-24 ENCOUNTER — Telehealth: Payer: Self-pay | Admitting: Internal Medicine

## 2013-05-24 ENCOUNTER — Encounter: Payer: Self-pay | Admitting: Internal Medicine

## 2013-05-24 VITALS — BP 115/60 | HR 60 | Temp 97.9°F | Wt 116.0 lb

## 2013-05-24 DIAGNOSIS — N390 Urinary tract infection, site not specified: Secondary | ICD-10-CM

## 2013-05-24 DIAGNOSIS — E039 Hypothyroidism, unspecified: Secondary | ICD-10-CM

## 2013-05-24 DIAGNOSIS — F341 Dysthymic disorder: Secondary | ICD-10-CM

## 2013-05-24 DIAGNOSIS — F419 Anxiety disorder, unspecified: Secondary | ICD-10-CM

## 2013-05-24 DIAGNOSIS — E119 Type 2 diabetes mellitus without complications: Secondary | ICD-10-CM

## 2013-05-24 DIAGNOSIS — F329 Major depressive disorder, single episode, unspecified: Secondary | ICD-10-CM

## 2013-05-24 DIAGNOSIS — D518 Other vitamin B12 deficiency anemias: Secondary | ICD-10-CM

## 2013-05-24 DIAGNOSIS — I714 Abdominal aortic aneurysm, without rupture, unspecified: Secondary | ICD-10-CM

## 2013-05-24 DIAGNOSIS — I10 Essential (primary) hypertension: Secondary | ICD-10-CM

## 2013-05-24 DIAGNOSIS — R32 Unspecified urinary incontinence: Secondary | ICD-10-CM

## 2013-05-24 DIAGNOSIS — F32A Depression, unspecified: Secondary | ICD-10-CM

## 2013-05-24 LAB — COMPREHENSIVE METABOLIC PANEL
ALBUMIN: 3.6 g/dL (ref 3.5–5.2)
ALK PHOS: 85 U/L (ref 39–117)
ALT: 12 U/L (ref 0–35)
AST: 17 U/L (ref 0–37)
BILIRUBIN TOTAL: 0.6 mg/dL (ref 0.3–1.2)
BUN: 18 mg/dL (ref 6–23)
CO2: 25 meq/L (ref 19–32)
Calcium: 9.1 mg/dL (ref 8.4–10.5)
Chloride: 108 mEq/L (ref 96–112)
Creatinine, Ser: 0.9 mg/dL (ref 0.4–1.2)
GFR: 62.45 mL/min (ref >60.00)
Glucose, Bld: 81 mg/dL (ref 70–99)
POTASSIUM: 4.3 meq/L (ref 3.5–5.1)
SODIUM: 142 meq/L (ref 135–145)
TOTAL PROTEIN: 6.6 g/dL (ref 6.0–8.3)

## 2013-05-24 LAB — TSH: TSH: 2.4 u[IU]/mL (ref 0.35–5.50)

## 2013-05-24 LAB — VITAMIN B12: VITAMIN B 12: 185 pg/mL — AB (ref 211–911)

## 2013-05-24 LAB — HEMOGLOBIN A1C: Hgb A1c MFr Bld: 6.2 % (ref 4.6–6.5)

## 2013-05-24 LAB — FOLATE: FOLATE: 22 ng/mL (ref >5.9)

## 2013-05-24 MED ORDER — BENAZEPRIL HCL 20 MG PO TABS: ORAL_TABLET | ORAL | Status: DC

## 2013-05-24 MED ORDER — FELODIPINE ER 10 MG PO TB24: ORAL_TABLET | ORAL | Status: DC

## 2013-05-24 MED ORDER — ALPRAZOLAM 0.25 MG PO TABS: ORAL_TABLET | ORAL | Status: DC

## 2013-05-24 MED ORDER — ATORVASTATIN CALCIUM 40 MG PO TABS: ORAL_TABLET | ORAL | Status: DC

## 2013-05-24 MED ORDER — METOPROLOL TARTRATE 50 MG PO TABS: ORAL_TABLET | ORAL | Status: DC

## 2013-05-24 NOTE — Assessment & Plan Note (Signed)
Last ultrasound 09-2011, compared to 2011 it was a stable

## 2013-05-24 NOTE — Progress Notes (Signed)
Pre visit review using our clinic review tool, if applicable. No additional management support is needed unless otherwise documented below in the visit note. 

## 2013-05-24 NOTE — Assessment & Plan Note (Addendum)
PHQ 9--- 11, moderate sx, denies suicide ideas. Counseled, has been and still  is  reluctant to take medication (SSRIs). She is also reluctant to take Xanax every night despite my advice. Patient is counseled, advised to call me if she has increased symptoms, recommend also to see a counselor

## 2013-05-24 NOTE — Assessment & Plan Note (Signed)
Labs

## 2013-05-24 NOTE — Progress Notes (Signed)
Subjective:    Patient ID: Crystal Osborne, female    DOB: 02-Jan-1932, 80 y.o.   MRN: [id number]  DOS:  05/24/2013 Type of  visit: ROV, last OV 7-14 Lost her sister a few days ago, obviously affected by that, not sleeping well, takes Xanax only every 2 or 3 days when   absolutely can not sleep. Complain of urinary incontinence, worse at night. Removed a tick from the right hip about 3 weeks ago, tick was not engorged. Concerned about it, no rash. Chronic medical problems: Good compliance of medication, no ambulatory BPs. Complains about severe bruising, would like to decrease aspirin dose.  ROS Denies fever or chills Denies any blood in the stools or gross hematuria. No suicidal ideas No dysuria, difficulty urinating. Denies any headaches, no unusual aches or pains and again no rash at the area of tick bite  Past Medical History  Diagnosis Date  . Hyperlipidemia   . Breast cancer 1960  . Osteoporosis 2003  . CVA (cerebrovascular accident) 2006  . Hypertension   . Transient hypothyroidism     d/t thyroidectomy  . AAA (abdominal aortic aneurysm)     sees Dr Eden Lathe   . B12 deficiency   . Shingles     repeated episone 9/11 went to a UC  . Diabetes mellitus 05/29/2010    A1c 6.0 on October 2011   . CAROTID ARTERY DISEASE 11/28/2008    sees Dr Oneida Alar   . UTI (lower urinary tract infection) May 2013    Past Surgical History  Procedure Laterality Date  . Mastectomy, radical      right, [place] (18s)  . Abdominal hysterectomy      no oophorectomy  . Thyroidectomy      History   Social History  . Marital Status: Single    Spouse Name: N/A    Number of Children: 3  . Years of Education: N/A   Occupational History  . retired     Social History Main Topics  . Smoking status: Current Every Day Smoker -- 0.50 packs/day    Types: Cigarettes  . Smokeless tobacco: Never Used  . Alcohol Use: Yes     Comment: very rarely   . Drug Use: No  . Sexual Activity: Not on file     Other Topics Concern  . Not on file   Social History Narrative   Has 3 children in [place]   69 y/o son, disabled by stroke (09-2011)      still drives   Has a sister that lives in the [place], [place], [place] )--died 06/21/22                Medication List       This list is accurate as of: 05/24/13  3:18 PM.  Always use your most recent med list.               ALPRAZolam 0.25 MG tablet  Commonly known as:  XANAX  take 2 tablet by mouth at bedtime if needed     AMBULATORY NON FORMULARY MEDICATION  Right Breast Prosthesis  Dx: T06.26     aspirin 81 MG tablet  Take 81 mg by mouth daily.     atorvastatin 40 MG tablet  Commonly known as:  LIPITOR  take 1 tablet by mouth once daily     benazepril 20 MG tablet  Commonly known as:  LOTENSIN  take 1 tablet by mouth twice a day  CENTRUM SILVER Chew  Chew by mouth.     felodipine 10 MG 24 hr tablet  Commonly known as:  PLENDIL  Take 1 tablet by mouth daily.     metoprolol 50 MG tablet  Commonly known as:  LOPRESSOR  take 1 tablet by mouth twice a day           Objective:   Physical Exam BP 115/60  Pulse 60  Temp(Src) 97.9 F (36.6 C)  Wt 116 lb (52.617 kg)  SpO2 97% General -- alert, well-developed, NAD.    HEENT-- Not pale Lungs -- normal respiratory effort, no intercostal retractions, no accessory muscle use, and normal breath sounds.  Heart-- normal rate, regular rhythm, no murmur.  Extremities-- no pretibial edema bilaterally  Neurologic--  alert & oriented X3. Speech normal, gait normal, strength normal in all extremities.   skn: 2 mm hyperpigmented lesion close to the R hip (buttock) Psych-- Cognition and judgment appear intact. Cooperative with normal attention span and concentration. slt depressed appearing, no anxious.        Assessment & Plan:    Tick bite, no evidence of tickborne disease at this time. Skin lesion is likely postinflammatory hyperpigmentation

## 2013-05-24 NOTE — Assessment & Plan Note (Signed)
On no  medications, check a TSH 

## 2013-05-24 NOTE — Assessment & Plan Note (Addendum)
Last ultrasound 8- 2013, next per vascular surgery

## 2013-05-24 NOTE — Assessment & Plan Note (Signed)
Well-controlled, no change, check a CMP 

## 2013-05-24 NOTE — Assessment & Plan Note (Signed)
taking aspirin 325, complains of severe bruising, we agreed to decrease aspirin to 81 mg

## 2013-05-24 NOTE — Patient Instructions (Signed)
Get your blood work before you leave   Next visit is for a physical exam in 3 months   fasting Please make an appointment

## 2013-05-24 NOTE — Telephone Encounter (Signed)
Relevant patient education mailed to patient.  

## 2013-05-25 ENCOUNTER — Telehealth: Payer: Self-pay | Admitting: Internal Medicine

## 2013-05-25 NOTE — Telephone Encounter (Signed)
Relevant patient education mailed to patient.  

## 2013-05-26 LAB — URINALYSIS, ROUTINE W REFLEX MICROSCOPIC
Bilirubin Urine: NEGATIVE
Ketones, ur: NEGATIVE
Nitrite: POSITIVE — AB
SPECIFIC GRAVITY, URINE: 1.015 (ref 1.000–1.030)
TOTAL PROTEIN, URINE-UPE24: NEGATIVE
Urine Glucose: NEGATIVE
Urobilinogen, UA: 0.2 (ref 0.0–1.0)
pH: 6 (ref 5.0–8.0)

## 2013-05-26 LAB — POCT URINALYSIS DIPSTICK
Bilirubin, UA: NEGATIVE
Glucose, UA: NEGATIVE
Ketones, UA: NEGATIVE
NITRITE UA: POSITIVE
Protein, UA: NEGATIVE
Spec Grav, UA: 1.01
Urobilinogen, UA: 0.2
pH, UA: 6

## 2013-05-26 NOTE — Addendum Note (Signed)
Addended by: Modena Morrow D on: 05/26/2013 08:27 AM   Modules accepted: Orders

## 2013-05-28 LAB — URINE CULTURE: Colony Count: >=100000

## 2013-06-07 ENCOUNTER — Telehealth: Payer: Self-pay

## 2013-06-07 NOTE — Telephone Encounter (Signed)
Relevant patient education mailed to patient.  

## 2013-08-10 ENCOUNTER — Telehealth: Payer: Self-pay | Admitting: *Deleted

## 2013-08-10 NOTE — Telephone Encounter (Signed)
Spoke with Ed, RpH from [facility] who called on behalf of the patient. Patient stated she was given a hard copy script for Xanax in April but lost it. Verbal order given to dispense Xanax 0.25mg  #60 with 1 refill as it was previously written.

## 2013-11-09 ENCOUNTER — Telehealth: Payer: Self-pay

## 2013-11-09 NOTE — Telephone Encounter (Signed)
Pt stated that they would call back to schedule. They are currently having dental work done.

## 2014-01-25 ENCOUNTER — Ambulatory Visit (INDEPENDENT_AMBULATORY_CARE_PROVIDER_SITE_OTHER): Payer: Medicare Other | Admitting: Internal Medicine

## 2014-01-25 ENCOUNTER — Encounter: Payer: Self-pay | Admitting: Internal Medicine

## 2014-01-25 VITALS — BP 133/77 | HR 83 | Temp 97.4°F | Wt 110.0 lb

## 2014-01-25 DIAGNOSIS — E538 Deficiency of other specified B group vitamins: Secondary | ICD-10-CM

## 2014-01-25 DIAGNOSIS — I10 Essential (primary) hypertension: Secondary | ICD-10-CM

## 2014-01-25 DIAGNOSIS — J449 Chronic obstructive pulmonary disease, unspecified: Secondary | ICD-10-CM

## 2014-01-25 HISTORY — DX: Chronic obstructive pulmonary disease, unspecified: J44.9

## 2014-01-25 LAB — BASIC METABOLIC PANEL
BUN: 16 mg/dL (ref 6–23)
CALCIUM: 9 mg/dL (ref 8.4–10.5)
CO2: 28 mEq/L (ref 19–32)
CREATININE: 0.9 mg/dL (ref 0.4–1.2)
Chloride: 104 mEq/L (ref 96–112)
GFR: 62.34 mL/min (ref >60.00)
Glucose, Bld: 97 mg/dL (ref 70–99)
Potassium: 4.5 mEq/L (ref 3.5–5.1)
Sodium: 138 mEq/L (ref 135–145)

## 2014-01-25 MED ORDER — AMOXICILLIN 500 MG PO CAPS: 1000.0000 mg | ORAL_CAPSULE | Freq: Two times a day (BID) | ORAL | Status: DC

## 2014-01-25 MED ORDER — BUDESONIDE-FORMOTEROL FUMARATE 160-4.5 MCG/ACT IN AERO: 2.0000 | INHALATION_SPRAY | Freq: Two times a day (BID) | RESPIRATORY_TRACT | Status: DC

## 2014-01-25 MED ORDER — HYDROCODONE-HOMATROPINE 5-1.5 MG/5ML PO SYRP: 5.0000 mL | ORAL_SOLUTION | Freq: Four times a day (QID) | ORAL | Status: DC | PRN

## 2014-01-25 NOTE — Assessment & Plan Note (Addendum)
Patient is an active smoker, recent chest x-ray showed COPD changes, no previous formal diagnosis of COPD. Presents with cough, likely a COPD exacerbation. WBCs were slightly elevated. Status post a Z-Pak. She does have changes consistent with otitis media. Plan:  Amoxicillin, start Symbicort, recheck in one month, continue with Hycodan which she seems to be tolerating well. Flonase. PFTs once better

## 2014-01-25 NOTE — Assessment & Plan Note (Signed)
Seems well-controlled, continue with Lotensin, Plendil and Lopressor, check a BMP

## 2014-01-25 NOTE — Progress Notes (Signed)
Pre visit review using our clinic review tool, if applicable. No additional management support is needed unless otherwise documented below in the visit note. 

## 2014-01-25 NOTE — Patient Instructions (Addendum)
Get your blood work before you leave   Rest, fluids , tylenol Start Symbicort 2 puffs twice a day For cough, take Mucinex DM twice a day as needed  If the cough is severe take the hydrocodone syrup use OTC Nasocort or Flonase : 2 nasal sprays on each side of the nose daily until you feel better Take the antibiotic as prescribed  (Amoxicillin) Call if not gradually better over the next  10 days   Please come back to the office in 1 month for a routine check up

## 2014-01-25 NOTE — Assessment & Plan Note (Signed)
Last B12 low, recommend to start shots weekly x4, then monthly

## 2014-01-25 NOTE — Progress Notes (Signed)
Subjective:    Patient ID: Crystal Osborne, female    DOB: Nov 07, 1931, 80 y.o.   MRN: [id number]  DOS:  01/25/2014 Type of visit - description : acute Interval history: Developed cough about 3 weeks ago, dry, was getting progressively worse. Went to the urgent care 01/20/2014, records reviewed : CBC show white count of 14.5, hemoglobin 11.5 Chest x-ray showed COPD. Was prescribed a Z-Pak and hydrocodone.  currently doing about the same, continue with dry cough. Med list reviewed, good compliance.  ROS  denies fever chills No chest pain, no difficulty breathing except with cough spells. Does have left ear ache, some postnasal dripping initially. No sputum production, no wheezing.   Past Medical History  Diagnosis Date  . Hyperlipidemia   . Breast cancer 1960  . Osteoporosis 2003  . CVA (cerebrovascular accident) 2006  . Hypertension   . Transient hypothyroidism     d/t thyroidectomy  . AAA (abdominal aortic aneurysm)     sees Dr Eden Lathe   . B12 deficiency   . Shingles     repeated episone 9/11 went to a UC  . Diabetes mellitus 05/29/2010    A1c 6.0 on October 2011   . CAROTID ARTERY DISEASE 11/28/2008    sees Dr Oneida Alar   . UTI (lower urinary tract infection) May 2013  . COPD (chronic obstructive pulmonary disease) 01/25/2014    Past Surgical History  Procedure Laterality Date  . Mastectomy, radical      right, [place] (61s)  . Abdominal hysterectomy      no oophorectomy  . Thyroidectomy      History   Social History  . Marital Status: Single    Spouse Name: N/A    Number of Children: 3  . Years of Education: N/A   Occupational History  . retired     Social History Main Topics  . Smoking status: Current Every Day Smoker -- 0.50 packs/day    Types: Cigarettes  . Smokeless tobacco: Never Used  . Alcohol Use: Yes     Comment: very rarely   . Drug Use: No  . Sexual Activity: Not on file   Other Topics Concern  . Not on file   Social History Narrative    Has 3 children in [place]   38 y/o son, disabled by stroke (09-2011)      still drives   Has a sister that lives in the [place], [place], [place] )--died June 04, 2022                Medication List       This list is accurate as of: 01/25/14  6:12 PM.  Always use your most recent med list.               ALPRAZolam 0.25 MG tablet  Commonly known as:  XANAX  take 2 tablet by mouth at bedtime if needed     AMBULATORY NON FORMULARY MEDICATION  Right Breast Prosthesis  Dx: V15.29     amoxicillin 500 MG capsule  Commonly known as:  AMOXIL  Take 2 capsules (1,000 mg total) by mouth 2 (two) times daily.     aspirin 81 MG tablet  Take 81 mg by mouth daily.     atorvastatin 40 MG tablet  Commonly known as:  LIPITOR  take 1 tablet by mouth once daily     benazepril 20 MG tablet  Commonly known as:  LOTENSIN  take 1 tablet by mouth twice a day  budesonide-formoterol 160-4.5 MCG/ACT inhaler  Commonly known as:  SYMBICORT  Inhale 2 puffs into the lungs 2 (two) times daily.     CENTRUM SILVER Chew  Chew by mouth.     felodipine 10 MG 24 hr tablet  Commonly known as:  PLENDIL  Take 1 tablet by mouth daily.     HYDROcodone-homatropine 5-1.5 MG/5ML syrup  Commonly known as:  HYCODAN  Take 5 mLs by mouth every 6 (six) hours as needed for cough.     metoprolol 50 MG tablet  Commonly known as:  LOPRESSOR  take 1 tablet by mouth twice a day           Objective:   Physical Exam BP 133/77 mmHg  Pulse 83  Temp(Src) 97.4 F (36.3 C) (Oral)  Wt 110 lb (49.896 kg)  SpO2 97% General -- alert, well-developed, NAD.  HEENT-- Not pale.  TMs slightly bulged, worse on the left, the left is a slightly red Throat symmetric, no redness or discharge. Face symmetric, sinuses not tender to palpation. Nose congested.  Lungs -- normal respiratory effort, no intercostal retractions, no accessory muscle use, and decreased breath sounds.  Heart-- normal rate, regular rhythm, no  murmur.  Extremities-- no pretibial edema bilaterally  Neurologic--  alert & oriented X3. Speech normal, gait appropriate for age, strength symmetric and appropriate for age.  Psych-- Cognition and judgment appear intact. Cooperative with normal attention span and concentration. No anxious or depressed appearing.         Assessment & Plan:

## 2014-02-28 ENCOUNTER — Other Ambulatory Visit: Payer: Self-pay | Admitting: Internal Medicine

## 2014-05-04 ENCOUNTER — Other Ambulatory Visit: Payer: Self-pay | Admitting: Internal Medicine

## 2014-05-17 ENCOUNTER — Other Ambulatory Visit: Payer: Self-pay

## 2014-05-25 ENCOUNTER — Ambulatory Visit (INDEPENDENT_AMBULATORY_CARE_PROVIDER_SITE_OTHER): Payer: Medicare Other | Admitting: Internal Medicine

## 2014-05-25 ENCOUNTER — Encounter: Payer: Self-pay | Admitting: Internal Medicine

## 2014-05-25 VITALS — BP 120/64 | HR 56 | Temp 98.2°F | Ht 65.0 in | Wt 113.5 lb

## 2014-05-25 DIAGNOSIS — E538 Deficiency of other specified B group vitamins: Secondary | ICD-10-CM | POA: Diagnosis not present

## 2014-05-25 DIAGNOSIS — F32A Depression, unspecified: Secondary | ICD-10-CM

## 2014-05-25 DIAGNOSIS — J449 Chronic obstructive pulmonary disease, unspecified: Secondary | ICD-10-CM

## 2014-05-25 DIAGNOSIS — E119 Type 2 diabetes mellitus without complications: Secondary | ICD-10-CM | POA: Diagnosis not present

## 2014-05-25 DIAGNOSIS — I10 Essential (primary) hypertension: Secondary | ICD-10-CM

## 2014-05-25 DIAGNOSIS — F418 Other specified anxiety disorders: Secondary | ICD-10-CM

## 2014-05-25 DIAGNOSIS — I714 Abdominal aortic aneurysm, without rupture, unspecified: Secondary | ICD-10-CM

## 2014-05-25 DIAGNOSIS — E1159 Type 2 diabetes mellitus with other circulatory complications: Secondary | ICD-10-CM

## 2014-05-25 DIAGNOSIS — I739 Peripheral vascular disease, unspecified: Secondary | ICD-10-CM

## 2014-05-25 DIAGNOSIS — D518 Other vitamin B12 deficiency anemias: Secondary | ICD-10-CM

## 2014-05-25 DIAGNOSIS — E785 Hyperlipidemia, unspecified: Secondary | ICD-10-CM

## 2014-05-25 DIAGNOSIS — I779 Disorder of arteries and arterioles, unspecified: Secondary | ICD-10-CM

## 2014-05-25 DIAGNOSIS — F419 Anxiety disorder, unspecified: Secondary | ICD-10-CM

## 2014-05-25 DIAGNOSIS — F329 Major depressive disorder, single episode, unspecified: Secondary | ICD-10-CM

## 2014-05-25 LAB — LIPID PANEL
Cholesterol: 135 mg/dL (ref 0–200)
HDL: 44.4 mg/dL (ref >39.00)
LDL Cholesterol: 68 mg/dL (ref 0–99)
NonHDL: 90.6
Total CHOL/HDL Ratio: 3
Triglycerides: 111 mg/dL (ref 0.0–149.0)
VLDL: 22.2 mg/dL (ref 0.0–40.0)

## 2014-05-25 LAB — CBC WITH DIFFERENTIAL/PLATELET
BASOS ABS: 0 10*3/uL (ref 0.0–0.1)
Basophils Relative: 0.3 % (ref 0.0–3.0)
EOS ABS: 0.2 10*3/uL (ref 0.0–0.7)
Eosinophils Relative: 1.7 % (ref 0.0–5.0)
HCT: 37.6 % (ref 36.0–46.0)
Hemoglobin: 12.6 g/dL (ref 12.0–15.0)
Lymphocytes Relative: 33.1 % (ref 12.0–46.0)
Lymphs Abs: 3 10*3/uL (ref 0.7–4.0)
MCHC: 33.5 g/dL (ref 30.0–36.0)
MCV: 92.2 fl (ref 78.0–100.0)
Monocytes Absolute: 0.6 10*3/uL (ref 0.1–1.0)
Monocytes Relative: 6.3 % (ref 3.0–12.0)
NEUTROS ABS: 5.3 10*3/uL (ref 1.4–7.7)
Neutrophils Relative %: 58.6 % (ref 43.0–77.0)
Platelets: 260 10*3/uL (ref 150.0–400.0)
RBC: 4.08 Mil/uL (ref 3.87–5.11)
RDW: 14.6 % (ref 11.5–15.5)
WBC: 9.1 10*3/uL (ref 4.0–10.5)

## 2014-05-25 LAB — HEMOGLOBIN A1C: Hgb A1c MFr Bld: 5.9 % (ref 4.6–6.5)

## 2014-05-25 MED ORDER — METOPROLOL TARTRATE 50 MG PO TABS: 50.0000 mg | ORAL_TABLET | Freq: Two times a day (BID) | ORAL | Status: DC

## 2014-05-25 MED ORDER — ATORVASTATIN CALCIUM 40 MG PO TABS: 40.0000 mg | ORAL_TABLET | Freq: Every day | ORAL | Status: DC

## 2014-05-25 MED ORDER — BENAZEPRIL HCL 20 MG PO TABS
20.0000 mg | ORAL_TABLET | Freq: Two times a day (BID) | ORAL | Status: DC
Start: 2014-05-25 — End: 2015-05-04

## 2014-05-25 MED ORDER — FELODIPINE ER 10 MG PO TB24: 10.0000 mg | ORAL_TABLET | Freq: Every day | ORAL | Status: DC

## 2014-05-25 MED ORDER — CYANOCOBALAMIN 1000 MCG/ML IJ SOLN
1000.0000 ug | Freq: Once | INTRAMUSCULAR | Status: AC
  Administered 2014-05-25: 1000 ug via INTRAMUSCULAR

## 2014-05-25 NOTE — Assessment & Plan Note (Addendum)
Used to see Dr. Oneida Alar for regular checkups, last ultrasound 2 years ago, she is asymptomatic. Recommend to either see Dr. Oneida Alar or  check a ultrasound, she declined. I explained the patient that an aneurysm can leak and cause death, also that there is now treatments other than invasive surgery. She still declined.

## 2014-05-25 NOTE — Assessment & Plan Note (Signed)
Noncompliant with shots, since she is here she agreed to have one today. Recommend to at least take a OTC supplements

## 2014-05-25 NOTE — Assessment & Plan Note (Signed)
Control, check a CBC

## 2014-05-25 NOTE — Assessment & Plan Note (Addendum)
she is not fasting but will check FLP which is overdue, good compliance with statins

## 2014-05-25 NOTE — Patient Instructions (Signed)
Get your blood work before you leave    Take OTC B12 supplement every day  Come back to the office in 3-4 months  for a physical exam  Please schedule an appointment at the front desk    Come back fasting

## 2014-05-25 NOTE — Assessment & Plan Note (Signed)
Counseled, again declined to take anything but xanax as needed

## 2014-05-25 NOTE — Assessment & Plan Note (Signed)
On no medications, check a A1c

## 2014-05-25 NOTE — Progress Notes (Signed)
Pre visit review using our clinic review tool, if applicable. No additional management support is needed unless otherwise documented below in the visit note. 

## 2014-05-25 NOTE — Assessment & Plan Note (Signed)
Currently asymptomatic,  since the last visit PFTs were not done. Declined a Prevnar

## 2014-05-25 NOTE — Progress Notes (Signed)
Subjective:    Patient ID: Crystal Osborne, female    DOB: 02/25/1931, 81 y.o.   MRN: [id number]  DOS:  05/25/2014 Type of visit - description : rov Interval history: Anxiety, under stress because one of her sons has cancer, status post XRT. Her other son was diagnosed with cirrhosis Hypertension, good medication compliance, normal ambulatory BPs COPD, not taking any medication. Feeling well. B12 deficiency, noncompliant with shots. Also, few days ago her right face   started to itch, she feels  she had an insect bite, then the  right face and neck swell up. No lip or tongue swelling, no shortness of breath. Symptoms subsided and she has a residual soft mass that the right cheek.   Review of Systems Denies chest pain or difficulty breathing No abdominal pain No cough, sputum production or wheezing. No suicidal ideas No slurred speech or double vision   Past Medical History  Diagnosis Date  . Hyperlipidemia   . Breast cancer 1960  . Osteoporosis 2003  . CVA (cerebrovascular accident) 2006  . Hypertension   . Transient hypothyroidism     d/t thyroidectomy  . AAA (abdominal aortic aneurysm)     sees Dr Eden Lathe   . B12 deficiency   . Shingles     repeated episone 9/11 went to a UC  . Diabetes mellitus 05/29/2010    A1c 6.0 on October 2011   . CAROTID ARTERY DISEASE 11/28/2008    sees Dr Oneida Alar   . UTI (lower urinary tract infection) May 2013  . COPD (chronic obstructive pulmonary disease) 01/25/2014    Past Surgical History  Procedure Laterality Date  . Mastectomy, radical      right, [place] (50s)  . Abdominal hysterectomy      no oophorectomy  . Thyroidectomy      History   Social History  . Marital Status: Single    Spouse Name: N/A  . Number of Children: 3  . Years of Education: N/A   Occupational History  . retired     Social History Main Topics  . Smoking status: Current Every Day Smoker -- 0.50 packs/day    Types: Cigarettes  . Smokeless tobacco:  Never Used  . Alcohol Use: Yes     Comment: very rarely   . Drug Use: No  . Sexual Activity: Not on file   Other Topics Concern  . Not on file   Social History Narrative   Has 3 children in [place]   67 y/o son, disabled by stroke (09-2011)      still drives   Has a sister that lives in the [place], [place], [place] )--died Jun 14, 2022                Medication List       This list is accurate as of: 05/25/14  2:06 PM.  Always use your most recent med list.               ALPRAZolam 0.25 MG tablet  Commonly known as:  XANAX  take 2 tablet by mouth at bedtime if needed     AMBULATORY NON FORMULARY MEDICATION  Right Breast Prosthesis  Dx: O84.16     aspirin 81 MG tablet  Take 81 mg by mouth daily.     atorvastatin 40 MG tablet  Commonly known as:  LIPITOR  Take 1 tablet (40 mg total) by mouth daily. OVERDUE FOR APPT WITH DR Ronne Savoia. NO FURTHER REFILLS [phone].  benazepril 20 MG tablet  Commonly known as:  LOTENSIN  take 1 tablet by mouth twice a day     budesonide-formoterol 160-4.5 MCG/ACT inhaler  Commonly known as:  SYMBICORT  Inhale 2 puffs into the lungs 2 (two) times daily.     CENTRUM SILVER Chew  Chew by mouth.     felodipine 10 MG 24 hr tablet  Commonly known as:  PLENDIL  Take 1 tablet (10 mg total) by mouth daily. DUE FOR APPT WITH DR Collier Bohnet. [phone].     metoprolol 50 MG tablet  Commonly known as:  LOPRESSOR  take 1 tablet by mouth twice a day           Objective:   Physical Exam  HENT:  Head:     BP 120/64 mmHg  Pulse 56  Temp(Src) 98.2 F (36.8 C) (Oral)  Ht 5\' 5"  (1.651 m)  Wt 113 lb 8 oz (51.483 kg)  BMI 18.89 kg/m2  SpO2 98%  General:   Well developed, well nourished . NAD.  HEENT:  Normocephalic . Face symmetric, atraumatic Carotid pulses present, bilaterally Lungs:  Decreased breath sounds but otherwise clear Normal respiratory effort, no intercostal retractions, no accessory muscle use. Heart: RRR,  no murmur.    Abdomen:  Not distended, soft, non-tender. No rebound or rigidity. Palpable aorta at the upper abdomen without tenderness or bruit. Muscle skeletal: no pretibial edema bilaterally  Skin: Not pale. Not jaundice Neurologic:  alert & oriented X3.  Speech normal, gait appropriate for age and unassisted Psych--  Cognition and judgment appear intact.  Cooperative with normal attention span and concentration.  Behavior appropriate. No anxious or depressed appearing.      Assessment & Plan:    Allergic reaction? See history of present illness. Recommend to take a Benadryl or Zyrtec if that ever happens again and call us.

## 2014-05-25 NOTE — Assessment & Plan Note (Signed)
See comments under aortic aneurysm, we had the same conversation, she declined a carotid ultrasound

## 2014-06-06 ENCOUNTER — Encounter: Payer: Self-pay | Admitting: Gastroenterology

## 2014-08-17 ENCOUNTER — Encounter: Payer: Self-pay | Admitting: Internal Medicine

## 2014-08-17 ENCOUNTER — Ambulatory Visit (INDEPENDENT_AMBULATORY_CARE_PROVIDER_SITE_OTHER): Payer: Medicare Other | Admitting: Internal Medicine

## 2014-08-17 VITALS — BP 122/74 | HR 60 | Temp 97.9°F | Ht 65.0 in | Wt 110.2 lb

## 2014-08-17 DIAGNOSIS — E538 Deficiency of other specified B group vitamins: Secondary | ICD-10-CM | POA: Diagnosis not present

## 2014-08-17 DIAGNOSIS — N329 Bladder disorder, unspecified: Secondary | ICD-10-CM | POA: Diagnosis not present

## 2014-08-17 DIAGNOSIS — I10 Essential (primary) hypertension: Secondary | ICD-10-CM | POA: Diagnosis not present

## 2014-08-17 DIAGNOSIS — R351 Nocturia: Secondary | ICD-10-CM

## 2014-08-17 DIAGNOSIS — G4700 Insomnia, unspecified: Secondary | ICD-10-CM

## 2014-08-17 LAB — BASIC METABOLIC PANEL
BUN: 18 mg/dL (ref 6–23)
CALCIUM: 9.6 mg/dL (ref 8.4–10.5)
CO2: 28 mEq/L (ref 19–32)
Chloride: 107 mEq/L (ref 96–112)
Creatinine, Ser: 0.93 mg/dL (ref 0.40–1.20)
GFR: 61.48 mL/min (ref >60.00)
Glucose, Bld: 92 mg/dL (ref 70–99)
Potassium: 4.3 mEq/L (ref 3.5–5.1)
SODIUM: 142 meq/L (ref 135–145)

## 2014-08-17 MED ORDER — BETAMETHASONE DIPROPIONATE AUG 0.05 % EX CREA: TOPICAL_CREAM | Freq: Two times a day (BID) | CUTANEOUS | Status: DC

## 2014-08-17 MED ORDER — ALPRAZOLAM 0.25 MG PO TABS: 0.2500 mg | ORAL_TABLET | Freq: Every evening | ORAL | Status: DC | PRN

## 2014-08-17 MED ORDER — CYANOCOBALAMIN 1000 MCG/ML IJ SOLN: 1000.0000 ug | INTRAMUSCULAR | Status: DC

## 2014-08-17 MED ORDER — CYANOCOBALAMIN 1000 MCG/ML IJ SOLN
1000.0000 ug | Freq: Once | INTRAMUSCULAR | Status: AC
  Administered 2014-08-17: 1000 ug via INTRAMUSCULAR

## 2014-08-17 NOTE — Patient Instructions (Signed)
Get your blood work before you leave   Continue taking B12 supplements by mouth daily  Get a  injection of B12 every month  Wash all your bed clothing at high temperature every week  Use the cream as needed for itching, call if not improving  Restart taking Xanax to help you sleep, one or 2 tablets at bedtime, watch for excessive somnolence

## 2014-08-17 NOTE — Assessment & Plan Note (Addendum)
She agreed to start shots, her friend is a Marine scientist and can provide a shot monthly. Plan: Prescription provided, shot here at the office today

## 2014-08-17 NOTE — Assessment & Plan Note (Signed)
Reports  problems staying asleep, has not been using Xanax, refill medications

## 2014-08-17 NOTE — Progress Notes (Signed)
Pre visit review using our clinic review tool, if applicable. No additional management support is needed unless otherwise documented below in the visit note. 

## 2014-08-17 NOTE — Assessment & Plan Note (Signed)
Reports nocturia, history of bladder prolapse, check a UA.

## 2014-08-17 NOTE — Assessment & Plan Note (Signed)
Controlled, check a BMP 

## 2014-08-17 NOTE — Progress Notes (Signed)
Subjective:    Patient ID: Crystal Osborne, female    DOB: Mar 15, 1931, 81 y.o.   MRN: [id number]  DOS:  08/17/2014 Type of visit - description : rov Interval history: B12 deficiency, willing to start shots Hypertension, good compliance of medication, ambulatory BPs really good Has developed nocturia for several months, sometimes small amounts of urine, sometimes large amounts. Also several months history of on and off, extremely itchy red bumps at different places usually in the extremities or around the waistline. Insomnia, having difficulty time staying asleep, not taking Xanax   Review of Systems  Denies fever chills No chest pain, difficulty breathing or lower extremity edema No dysuria, gross hematuria difficulty urinating.  Past Medical History  Diagnosis Date  . Hyperlipidemia   . Breast cancer 1960  . Osteoporosis 2003  . CVA (cerebrovascular accident) 2006  . Hypertension   . Transient hypothyroidism     d/t thyroidectomy  . AAA (abdominal aortic aneurysm)     sees Dr Eden Lathe   . B12 deficiency   . Shingles     repeated episone 9/11 went to a UC  . Diabetes mellitus 05/29/2010    A1c 6.0 on October 2011   . CAROTID ARTERY DISEASE 11/28/2008    sees Dr Oneida Alar   . UTI (lower urinary tract infection) May 2013  . COPD (chronic obstructive pulmonary disease) 01/25/2014    Past Surgical History  Procedure Laterality Date  . Mastectomy, radical      right, [place] (33s)  . Abdominal hysterectomy      no oophorectomy  . Thyroidectomy      History   Social History  . Marital Status: Single    Spouse Name: N/A  . Number of Children: 3  . Years of Education: N/A   Occupational History  . retired     Social History Main Topics  . Smoking status: Current Every Day Smoker -- 0.50 packs/day    Types: Cigarettes  . Smokeless tobacco: Never Used  . Alcohol Use: Yes     Comment: very rarely   . Drug Use: No  . Sexual Activity: Not on file   Other Topics  Concern  . Not on file   Social History Narrative   Has 3 children in [place]   68 y/o son, disabled by stroke (09-2011)      still drives   Has a sister that lives in the [place], [place], [place] )--died 06-14-2022                Medication List       This list is accurate as of: 08/17/14  5:28 PM.  Always use your most recent med list.               ALPRAZolam 0.25 MG tablet  Commonly known as:  XANAX  Take 1-2 tablets (0.25-0.5 mg total) by mouth at bedtime as needed for sleep.     AMBULATORY NON FORMULARY MEDICATION  Right Breast Prosthesis  Dx: K27.06     aspirin 81 MG tablet  Take 81 mg by mouth daily.     atorvastatin 40 MG tablet  Commonly known as:  LIPITOR  Take 1 tablet (40 mg total) by mouth daily.     augmented betamethasone dipropionate 0.05 % cream  Commonly known as:  DIPROLENE-AF  Apply topically 2 (two) times daily.     benazepril 20 MG tablet  Commonly known as:  LOTENSIN  Take 1 tablet (20 mg  total) by mouth 2 (two) times daily.     CENTRUM SILVER Chew  Chew by mouth.     cyanocobalamin 1000 MCG/ML injection  Commonly known as:  (VITAMIN B-12)  Inject 1 mL (1,000 mcg total) into the muscle every 30 (thirty) days.     felodipine 10 MG 24 hr tablet  Commonly known as:  PLENDIL  Take 1 tablet (10 mg total) by mouth daily.     metoprolol 50 MG tablet  Commonly known as:  LOPRESSOR  Take 1 tablet (50 mg total) by mouth 2 (two) times daily.           Objective:   Physical Exam BP 122/74 mmHg  Pulse 60  Temp(Src) 97.9 F (36.6 C) (Oral)  Ht 5\' 5"  (1.651 m)  Wt 110 lb 4 oz (50.009 kg)  BMI 18.35 kg/m2  SpO2 95% General:   Well developed, slightly under weight . NAD.  Neck:  Full range of motion. Supple. No  thyromegaly , normal carotid pulse HEENT:  Normocephalic . Face symmetric, atraumatic Lungs:  CTA B Normal respiratory effort, no intercostal retractions, no accessory muscle use. Heart: RRR,  no murmur.  No pretibial  edema bilaterally   Skin: She showed me a 3 mm, papular, slightly red skin lesion at the waistline on the left Neurologic:  alert & oriented X3.  Speech normal, gait appropriate for age and unassisted Strength symmetric and appropriate for age.  Psych: Cognition and judgment appear intact.  Cooperative with normal attention span and concentration.  Behavior appropriate. No anxious or depressed appearing.        Assessment & Plan:     Pruritic lesions, bed bugs? Recommend a topical steroid Wash her bed sheets a high temperature

## 2014-08-21 DIAGNOSIS — N329 Bladder disorder, unspecified: Secondary | ICD-10-CM | POA: Diagnosis not present

## 2014-08-21 LAB — URINALYSIS, ROUTINE W REFLEX MICROSCOPIC
BILIRUBIN URINE: NEGATIVE
KETONES UR: NEGATIVE
Nitrite: POSITIVE — AB
Total Protein, Urine: NEGATIVE
Urine Glucose: NEGATIVE
Urobilinogen, UA: 0.2 (ref 0.0–1.0)
pH: 6 (ref 5.0–8.0)

## 2014-08-21 NOTE — Addendum Note (Signed)
Addended by: Peggyann Shoals on: 08/21/2014 10:27 AM   Modules accepted: Orders

## 2014-08-22 LAB — URINE CULTURE: Colony Count: >=100000

## 2014-08-28 NOTE — Addendum Note (Signed)
Addended by: Wilfrid Lund on: 08/28/2014 09:12 AM   Modules accepted: Orders

## 2014-09-13 DIAGNOSIS — N329 Bladder disorder, unspecified: Secondary | ICD-10-CM | POA: Diagnosis not present

## 2014-09-13 NOTE — Addendum Note (Signed)
Addended by: Modena Morrow D on: 09/13/2014 10:42 AM   Modules accepted: Orders

## 2014-09-15 LAB — URINE CULTURE: Colony Count: >=100000

## 2014-09-17 DIAGNOSIS — B029 Zoster without complications: Secondary | ICD-10-CM | POA: Diagnosis not present

## 2014-09-20 NOTE — Progress Notes (Signed)
Crystal Osborne called [facility] and they are faxing results over. HM

## 2014-09-21 ENCOUNTER — Other Ambulatory Visit: Payer: Self-pay | Admitting: Internal Medicine

## 2014-09-21 MED ORDER — NITROFURANTOIN MACROCRYSTAL 100 MG PO CAPS: 100.0000 mg | ORAL_CAPSULE | Freq: Two times a day (BID) | ORAL | Status: DC

## 2014-12-14 ENCOUNTER — Telehealth: Payer: Self-pay

## 2014-12-14 NOTE — Telephone Encounter (Signed)
Called to schedule annual wellness visit

## 2015-01-09 ENCOUNTER — Ambulatory Visit (INDEPENDENT_AMBULATORY_CARE_PROVIDER_SITE_OTHER): Payer: Medicare Other | Admitting: Internal Medicine

## 2015-01-09 ENCOUNTER — Encounter: Payer: Self-pay | Admitting: Internal Medicine

## 2015-01-09 VITALS — BP 110/70 | HR 74 | Temp 97.8°F | Resp 16 | Ht 65.0 in | Wt 119.0 lb

## 2015-01-09 DIAGNOSIS — Z09 Encounter for follow-up examination after completed treatment for conditions other than malignant neoplasm: Secondary | ICD-10-CM

## 2015-01-09 DIAGNOSIS — L282 Other prurigo: Secondary | ICD-10-CM

## 2015-01-09 MED ORDER — BETAMETHASONE DIPROPIONATE AUG 0.05 % EX OINT: TOPICAL_OINTMENT | Freq: Two times a day (BID) | CUTANEOUS | Status: DC

## 2015-01-09 MED ORDER — PREDNISONE 10 MG PO TABS: 10.0000 mg | ORAL_TABLET | Freq: Every day | ORAL | Status: DC

## 2015-01-09 NOTE — Patient Instructions (Addendum)
Take prednisone as prescribed for 5 days  Used the new cream  twice a day as needed  We are referring you to a dermatologist     Next visit  for a  physical exam within the next 2 months, fasting    Please schedule an appointment at the front desk

## 2015-01-09 NOTE — Progress Notes (Signed)
Subjective:    Patient ID: Crystal Osborne, female    DOB: 1931/11/20, 81 y.o.   MRN: [id number]  DOS:  01/09/2015 Type of visit - description : Acute visit  Interval history: Symptoms are going on for several months now , previously rx a topical steroid with little help. Reports that the pruritus is intense "something needs to be done"   Review of Systems Denies any fever or chills Not taking any new medications Itching is limited to her back and proximal upper extremities Recently had a  UTI, now asymptomatic  Past Medical History  Diagnosis Date  . Hyperlipidemia   . Breast cancer ([place]) 1960  . Osteoporosis 2003  . CVA (cerebrovascular accident) ([place]) 2006  . Hypertension   . Transient hypothyroidism     d/t thyroidectomy  . AAA (abdominal aortic aneurysm) [facility])     sees Dr Eden Lathe   . B12 deficiency   . Shingles     repeated episone 9/11 went to a UC  . Diabetes mellitus ([place]) 05/29/2010    A1c 6.0 on October 2011   . CAROTID ARTERY DISEASE 11/28/2008    sees Dr Oneida Alar   . UTI (lower urinary tract infection) May 2013  . COPD (chronic obstructive pulmonary disease) ([place]) 01/25/2014    Past Surgical History  Procedure Laterality Date  . Mastectomy, radical      right, [place] (50s)  . Abdominal hysterectomy      no oophorectomy  . Thyroidectomy      Social History   Social History  . Marital Status: Single    Spouse Name: N/A  . Number of Children: 3  . Years of Education: N/A   Occupational History  . retired     Social History Main Topics  . Smoking status: Current Every Day Smoker -- 0.50 packs/day    Types: Cigarettes  . Smokeless tobacco: Never Used  . Alcohol Use: Yes     Comment: very rarely   . Drug Use: No  . Sexual Activity: Not on file   Other Topics Concern  . Not on file   Social History Narrative   Has 3 children in [place]   33 y/o son, disabled by stroke (09-2011)      still drives   Has a sister that lives in the [place],  [place], [place] )--died 06/25/2022                Medication List       This list is accurate as of: 01/09/15 11:59 PM.  Always use your most recent med list.               ALPRAZolam 0.25 MG tablet  Commonly known as:  XANAX  Take 1-2 tablets (0.25-0.5 mg total) by mouth at bedtime as needed for sleep.     AMBULATORY NON FORMULARY MEDICATION  Right Breast Prosthesis  Dx: [id number]     aspirin 81 MG tablet  Take 81 mg by mouth daily.     atorvastatin 40 MG tablet  Commonly known as:  LIPITOR  Take 1 tablet (40 mg total) by mouth daily.     augmented betamethasone dipropionate 0.05 % ointment  Commonly known as:  DIPROLENE-AF  Apply topically 2 (two) times daily.     benazepril 20 MG tablet  Commonly known as:  LOTENSIN  Take 1 tablet (20 mg total) by mouth 2 (two) times daily.     CENTRUM SILVER Chew  Chew by mouth.  cyanocobalamin 1000 MCG/ML injection  Commonly known as:  (VITAMIN B-12)  Inject 1 mL (1,000 mcg total) into the muscle every 30 (thirty) days.     felodipine 10 MG 24 hr tablet  Commonly known as:  PLENDIL  Take 1 tablet (10 mg total) by mouth daily.     metoprolol 50 MG tablet  Commonly known as:  LOPRESSOR  Take 1 tablet (50 mg total) by mouth 2 (two) times daily.     predniSONE 10 MG tablet  Commonly known as:  DELTASONE  Take 1 tablet (10 mg total) by mouth daily. 2 tabs a day x 5 days           Objective:   Physical Exam  Skin:      BP 110/70 mmHg  Pulse 74  Temp(Src) 97.8 F (36.6 C) (Oral)  Resp 16  Ht 5\' 5"  (1.651 m)  Wt 119 lb (53.978 kg)  BMI 19.80 kg/m2  SpO2 98%  General:   Well developed, well nourished . NAD.  HEENT:  Normocephalic . Face symmetric, atraumatic Neurologic:  alert & oriented X3.  Speech normal, gait appropriate for age and unassisted Psych--  Cognition and judgment appear intact.  Cooperative with normal attention span and concentration.  Behavior appropriate. No anxious or depressed  appearing.      Assessment & Plan:   Assessment > DM HTN Hyperlipidemia COPD Osteoporosis 2003 B12 def  CVA 2006 AAA  Shingles 10-2009 Status post thyroidectomy, transient hypothyroidism Breast cancer 1960  PLAN Pruritic rash: Etiology unclear, the symptoms are apparently intense, will prescribe prednisone, switch from cream to steroid ointment ,  refer to dermatology.

## 2015-01-09 NOTE — Progress Notes (Signed)
Pre visit review using our clinic review tool, if applicable. No additional management support is needed unless otherwise documented below in the visit note. 

## 2015-01-10 DIAGNOSIS — Z09 Encounter for follow-up examination after completed treatment for conditions other than malignant neoplasm: Secondary | ICD-10-CM | POA: Insufficient documentation

## 2015-01-10 NOTE — Assessment & Plan Note (Signed)
Pruritic rash: Etiology unclear, the symptoms are apparently intense, will prescribe prednisone, switch from cream to steroid ointment ,  refer to dermatology.

## 2015-01-25 DIAGNOSIS — L2084 Intrinsic (allergic) eczema: Secondary | ICD-10-CM | POA: Diagnosis not present

## 2015-01-25 DIAGNOSIS — L821 Other seborrheic keratosis: Secondary | ICD-10-CM | POA: Diagnosis not present

## 2015-01-25 DIAGNOSIS — L299 Pruritus, unspecified: Secondary | ICD-10-CM | POA: Diagnosis not present

## 2015-02-16 ENCOUNTER — Encounter: Payer: Medicare Other | Admitting: Internal Medicine

## 2015-02-22 ENCOUNTER — Other Ambulatory Visit: Payer: Self-pay | Admitting: Internal Medicine

## 2015-03-12 DIAGNOSIS — J209 Acute bronchitis, unspecified: Secondary | ICD-10-CM | POA: Diagnosis not present

## 2015-03-12 DIAGNOSIS — R05 Cough: Secondary | ICD-10-CM | POA: Diagnosis not present

## 2015-03-12 DIAGNOSIS — J3489 Other specified disorders of nose and nasal sinuses: Secondary | ICD-10-CM | POA: Diagnosis not present

## 2015-05-04 ENCOUNTER — Other Ambulatory Visit: Payer: Self-pay

## 2015-05-04 MED ORDER — BENAZEPRIL HCL 20 MG PO TABS: 20.0000 mg | ORAL_TABLET | Freq: Two times a day (BID) | ORAL | Status: DC

## 2015-05-04 MED ORDER — METOPROLOL TARTRATE 50 MG PO TABS: 50.0000 mg | ORAL_TABLET | Freq: Two times a day (BID) | ORAL | Status: DC

## 2015-05-17 ENCOUNTER — Telehealth: Payer: Self-pay | Admitting: Behavioral Health

## 2015-05-17 NOTE — Telephone Encounter (Signed)
Unable to reach patient at time of Pre-Visit Call.  Left message for patient to return call when available.    

## 2015-05-18 ENCOUNTER — Ambulatory Visit (INDEPENDENT_AMBULATORY_CARE_PROVIDER_SITE_OTHER): Payer: Medicare Other | Admitting: Internal Medicine

## 2015-05-18 ENCOUNTER — Encounter: Payer: Self-pay | Admitting: Internal Medicine

## 2015-05-18 VITALS — BP 116/72 | HR 66 | Temp 97.7°F | Ht 65.0 in | Wt 117.0 lb

## 2015-05-18 DIAGNOSIS — I714 Abdominal aortic aneurysm, without rupture, unspecified: Secondary | ICD-10-CM

## 2015-05-18 DIAGNOSIS — Z136 Encounter for screening for cardiovascular disorders: Secondary | ICD-10-CM

## 2015-05-18 DIAGNOSIS — E785 Hyperlipidemia, unspecified: Secondary | ICD-10-CM | POA: Diagnosis not present

## 2015-05-18 DIAGNOSIS — E538 Deficiency of other specified B group vitamins: Secondary | ICD-10-CM

## 2015-05-18 DIAGNOSIS — Z0000 Encounter for general adult medical examination without abnormal findings: Secondary | ICD-10-CM

## 2015-05-18 DIAGNOSIS — E119 Type 2 diabetes mellitus without complications: Secondary | ICD-10-CM

## 2015-05-18 DIAGNOSIS — Z09 Encounter for follow-up examination after completed treatment for conditions other than malignant neoplasm: Secondary | ICD-10-CM

## 2015-05-18 DIAGNOSIS — I10 Essential (primary) hypertension: Secondary | ICD-10-CM

## 2015-05-18 LAB — COMPREHENSIVE METABOLIC PANEL
ALBUMIN: 3.9 g/dL (ref 3.5–5.2)
ALK PHOS: 82 U/L (ref 39–117)
ALT: 11 U/L (ref 0–35)
AST: 17 U/L (ref 0–37)
BILIRUBIN TOTAL: 0.7 mg/dL (ref 0.2–1.2)
BUN: 17 mg/dL (ref 6–23)
CALCIUM: 9.7 mg/dL (ref 8.4–10.5)
CO2: 27 mEq/L (ref 19–32)
CREATININE: 0.86 mg/dL (ref 0.40–1.20)
Chloride: 108 mEq/L (ref 96–112)
GFR: 67.17 mL/min (ref >60.00)
Glucose, Bld: 101 mg/dL — ABNORMAL HIGH (ref 70–99)
Potassium: 4.9 mEq/L (ref 3.5–5.1)
Sodium: 143 mEq/L (ref 135–145)
TOTAL PROTEIN: 7.1 g/dL (ref 6.0–8.3)

## 2015-05-18 LAB — LIPID PANEL
CHOL/HDL RATIO: 3
CHOLESTEROL: 138 mg/dL (ref 0–200)
HDL: 49.1 mg/dL (ref >39.00)
LDL Cholesterol: 71 mg/dL (ref 0–99)
NonHDL: 89.33
TRIGLYCERIDES: 94 mg/dL (ref 0.0–149.0)
VLDL: 18.8 mg/dL (ref 0.0–40.0)

## 2015-05-18 LAB — HEMOGLOBIN A1C: Hgb A1c MFr Bld: 6 % (ref 4.6–6.5)

## 2015-05-18 LAB — VITAMIN B12: VITAMIN B 12: 389 pg/mL (ref 211–911)

## 2015-05-18 NOTE — Progress Notes (Addendum)
Subjective:    Patient ID: Crystal Osborne, female    DOB: 07-22-1931, 82 y.o.   MRN: [id number]  DOS:  05/18/2015 Type of visit - description : Yearly checkup Interval history: Insomnia: Very rarely takes Xanax High cholesterol: Good medication compliance, on Lipitor HTN: Good medication compliance, not ambulatory BP B12 deficiency: Not on  shots. Carotid-aortic dz-- Has not accepted any testing lately but willing to consider now Colonoscopy? Has chronic constipation and diarrhea for years, previously told has IBS. Denies any blood in the stools or weight loss   Review of Systems  Constitutional: No fever. No chills. No unexplained wt changes. No unusual sweats  HEENT: No dental problems, no ear discharge, no facial swelling, no voice changes. No eye discharge, no eye  redness , no  intolerance to light   Respiratory: No wheezing , no  difficulty breathing. No cough , no mucus production  Cardiovascular: No CP, no leg swelling , no  Palpitations. No claudication  GI: no nausea, no vomiting, no diarrhea , no  abdominal pain.  No blood in the stools. No dysphagia, no odynophagia    Endocrine: No polyphagia, no polyuria , no polydipsia  GU: No dysuria, gross hematuria, difficulty urinating. No urinary urgency, no frequency.  Musculoskeletal: No joint swellings or unusual aches or pains  Skin: No change in the color of the skin, palor , no  Rash  Allergic, immunologic: No environmental allergies , no  food allergies  Neurological: No dizziness no  syncope. No headaches. No diplopia, no slurred, no slurred speech, no motor deficits, no facial  Numbness  Hematological: No enlarged lymph nodes, no easy bruising , no unusual bleedings  Psychiatry: No suicidal ideas, no hallucinations, no beavior problems, no confusion.  No unusual/severe anxiety, no depression   Past Medical History  Diagnosis Date  . Hyperlipidemia   . Breast cancer ([place]) 1960  . Osteoporosis 2003  . CVA  (cerebrovascular accident) ([place]) 2006  . Hypertension   . Transient hypothyroidism     d/t thyroidectomy  . AAA (abdominal aortic aneurysm) [facility])     sees Dr Eden Lathe   . B12 deficiency   . Shingles     repeated episone 9/11 went to a UC  . Diabetes mellitus ([place]) 05/29/2010    A1c 6.0 on October 2011   . CAROTID ARTERY DISEASE 11/28/2008    sees Dr Oneida Alar   . UTI (lower urinary tract infection) May 2013  . COPD (chronic obstructive pulmonary disease) ([place]) 01/25/2014    Past Surgical History  Procedure Laterality Date  . Mastectomy, radical      right, [place] (52s)  . Abdominal hysterectomy      no oophorectomy  . Thyroidectomy      Social History   Social History  . Marital Status: Single    Spouse Name: N/A  . Number of Children: 3  . Years of Education: N/A   Occupational History  . retired     Social History Main Topics  . Smoking status: Current Every Day Smoker    Types: Cigarettes  . Smokeless tobacco: Never Used     Comment: 1 ppd  . Alcohol Use: 0.0 oz/week    0 Standard drinks or equivalent per week     Comment: very rarely   . Drug Use: No  . Sexual Activity: Not on file   Other Topics Concern  . Not on file   Social History Narrative   Lives by herself   Has  2 children in [place], 1 son in [place]   41 y/o son, disabled by stroke (09-2011)      Son w/ bone cancer    Still drives   Has a sister that lives in the [place] ( [place], [place] )--died June 02, 2022             Family History  Problem Relation Age of Onset  . Cancer Other     sister (lung) , brother (mouth)  . Colon cancer Neg Hx   . Coronary artery disease Neg Hx   . Diabetes type II Neg Hx   . Breast cancer Neg Hx        Medication List       This list is accurate as of: 05/18/15  5:33 PM.  Always use your most recent med list.               ALPRAZolam 0.25 MG tablet  Commonly known as:  XANAX  Take 1-2 tablets (0.25-0.5 mg total) by mouth at bedtime as needed for  sleep.     AMBULATORY NON FORMULARY MEDICATION  Right Breast Prosthesis  Dx: [id number]     aspirin 81 MG tablet  Take 81 mg by mouth daily.     atorvastatin 40 MG tablet  Commonly known as:  LIPITOR  Take 1 tablet (40 mg total) by mouth daily.     augmented betamethasone dipropionate 0.05 % ointment  Commonly known as:  DIPROLENE-AF  Apply topically 2 (two) times daily.     benazepril 20 MG tablet  Commonly known as:  LOTENSIN  Take 1 tablet (20 mg total) by mouth 2 (two) times daily.     CENTRUM SILVER Chew  Chew by mouth.     cyanocobalamin 1000 MCG/ML injection  Commonly known as:  (VITAMIN B-12)  Inject 1 mL (1,000 mcg total) into the muscle every 30 (thirty) days.     felodipine 10 MG 24 hr tablet  Commonly known as:  PLENDIL  Take 1 tablet (10 mg total) by mouth daily.     metoprolol 50 MG tablet  Commonly known as:  LOPRESSOR  Take 1 tablet (50 mg total) by mouth 2 (two) times daily.           Objective:   Physical Exam BP 116/72 mmHg  Pulse 66  Temp(Src) 97.7 F (36.5 C) (Oral)  Ht 5\' 5"  (1.651 m)  Wt 117 lb (53.071 kg)  BMI 19.47 kg/m2  SpO2 93%  General:   Well developed, well nourished . NAD.  Neck: No  thyromegaly . Carotid pulses present bilaterally, decreased on the right. + Bruit bilaterally, worse on the right. HEENT:  Normocephalic . Face symmetric, atraumatic Lungs:  CTA B Normal respiratory effort, no intercostal retractions, no accessory muscle use. Heart: RRR,  no murmur.  No pretibial edema bilaterally  Abdomen:  Not distended, soft, non-tender. No rebound or rigidity.  No mass, no bruit. Skin: Exposed areas without rash. Not pale. Not jaundice Neurologic:  alert & oriented X3.  Speech normal, gait appropriate for age and unassisted Strength symmetric and appropriate for age.  Psych: Cognition and judgment appear intact.  Cooperative with normal attention span and concentration.  Behavior appropriate. No anxious or depressed  appearing.    Assessment & Plan:   Assessment > DM HTN Hyperlipidemia Insomnia-- rarely has xanax  COPD Osteoporosis 2003-- refused dexas consistently  B12 def (185 05-2013) CV: ---CVA 2006 ---Carotid artery dz-- las exam ~ 2013, declined further testing ---  AAA --last [place] 2013, has declined further testing Shingles 10-2009 Status post thyroidectomy, transient hypothyroidism Breast cancer 1960  PLAN DM: Continue Lotensin, check A1c HTN: Continue Lotensin, Plendil, Lopressor. Check a CMP Hyperlipidemia: On Lipitor, check labs. COPD: Essentially asymptomatic, declined a Prevnar, not ready to quit tobacco. Osteoporosis: Recommend calcium, vitamin D. Declined a DEXA before B12 deficiency: Check labs, encourage  at least PO supplements, if very low we will start injections  . Carotid disease, AAA: Offered referral for [place], declined a carotid but agreed to do a AAA [place] (afraid of risk of rupture)  needs to be in this location  Colonoscopy? Chronic diarrhea constipation, if she has anemia may recommend a GI eval (likes to see a different group). If sx are stable, no further testing is needed in my opinion.Marland Kitchen RTC 4 months

## 2015-05-18 NOTE — Assessment & Plan Note (Addendum)
Td  3-10; pneumonia shot 02-2007, prevnar- declined today  zostavax 2010  Cscope 12-06- hyperplastic polyp, sigmoidoscopy 11-2008- bx benign mucosa, Dr. Sharlett Iles last MMG --> ?. refuses further mammograms PAPs no longer indicated   Tobacco-- not ready to quit Diet and exercise recommended

## 2015-05-18 NOTE — Progress Notes (Signed)
Pre visit review using our clinic review tool, if applicable. No additional management support is needed unless otherwise documented below in the visit note. 

## 2015-05-18 NOTE — Patient Instructions (Signed)
Get your blood work before you leave     Please consider visit these websites for more information a healthcare power of attorney: --[email or web address]  Next visit in 4 months   Fall Prevention and Home Safety Falls cause injuries and can affect all age groups. It is possible to use preventive measures to significantly decrease the likelihood of falls. There are many simple measures which can make your home safer and prevent falls. OUTDOORS  Repair cracks and edges of walkways and driveways.  Remove high doorway thresholds.  Trim shrubbery on the main path into your home.  Have good outside lighting.  Clear walkways of tools, rocks, debris, and clutter.  Check that handrails are not broken and are securely fastened. Both sides of steps should have handrails.  Have leaves, snow, and ice cleared regularly.  Use sand or salt on walkways during winter months.  In the garage, clean up grease or oil spills. BATHROOM  Install night lights.  Install grab bars by the toilet and in the tub and shower.  Use non-skid mats or decals in the tub or shower.  Place a plastic non-slip stool in the shower to sit on, if needed.  Keep floors dry and clean up all water on the floor immediately.  Remove soap buildup in the tub or shower on a regular basis.  Secure bath mats with non-slip, double-sided rug tape.  Remove throw rugs and tripping hazards from the floors. BEDROOMS  Install night lights.  Make sure a bedside light is easy to reach.  Do not use oversized bedding.  Keep a telephone by your bedside.  Have a firm chair with side arms to use for getting dressed.  Remove throw rugs and tripping hazards from the floor. KITCHEN  Keep handles on pots and pans turned toward the center of the stove. Use back burners when possible.  Clean up spills quickly and allow time for drying.  Avoid walking on wet floors.  Avoid hot utensils  and knives.  Position shelves so they are not too high or low.  Place commonly used objects within easy reach.  If necessary, use a sturdy step stool with a grab bar when reaching.  Keep electrical cables out of the way.  Do not use floor polish or wax that makes floors slippery. If you must use wax, use non-skid floor wax.  Remove throw rugs and tripping hazards from the floor. STAIRWAYS  Never leave objects on stairs.  Place handrails on both sides of stairways and use them. Fix any loose handrails. Make sure handrails on both sides of the stairways are as long as the stairs.  Check carpeting to make sure it is firmly attached along stairs. Make repairs to worn or loose carpet promptly.  Avoid placing throw rugs at the top or bottom of stairways, or properly secure the rug with carpet tape to prevent slippage. Get rid of throw rugs, if possible.  Have an electrician put in a light switch at the top and bottom of the stairs. OTHER FALL PREVENTION TIPS  Wear low-heel or rubber-soled shoes that are supportive and fit well. Wear closed toe shoes.  When using a stepladder, make sure it is fully opened and both spreaders are firmly locked. Do not climb a closed stepladder.  Add color or contrast paint or tape to grab bars and handrails in your home. Place contrasting color strips on first and last steps.  Learn and use mobility aids as needed. Install an electrical emergency response  system.  Turn on lights to avoid dark areas. Replace light bulbs that burn out immediately. Get light switches that glow.  Arrange furniture to create clear pathways. Keep furniture in the same place.  Firmly attach carpet with non-skid or double-sided tape.  Eliminate uneven floor surfaces.  Select a carpet pattern that does not visually hide the edge of steps.  Be aware of all pets. OTHER HOME SAFETY TIPS  Set the water temperature for 120 F (48.8 C).  Keep emergency numbers on or near  the telephone.  Keep smoke detectors on every level of the home and near sleeping areas. Document Released: 01/17/2002 Document Revised: 07/29/2011 Document Reviewed: 04/18/2011 [facility] Patient Information 2015 [place], [place]. This information is not intended to replace advice given to you by your health care provider. Make sure you discuss any questions you have with your health care provider.   Preventive Care for Adults Ages 70 and over  Blood pressure check.** / Every 1 to 2 years.  Lipid and cholesterol check.**/ Every 5 years beginning at age 38.  Lung cancer screening. / Every year if you are aged 9-80 years and have a 30-pack-year history of smoking and currently smoke or have quit within the past 15 years. Yearly screening is stopped once you have quit smoking for at least 15 years or develop a health problem that would prevent you from having lung cancer treatment.  Fecal occult blood test (FOBT) of stool. / Every year beginning at age 64 and continuing until age 50. You may not have to do this test if you get a colonoscopy every 10 years.  Flexible sigmoidoscopy** or colonoscopy.** / Every 5 years for a flexible sigmoidoscopy or every 10 years for a colonoscopy beginning at age 63 and continuing until age 56.  Hepatitis C blood test.** / For all people born from [birth year] through 1963 and any individual with known risks for hepatitis C.  Abdominal aortic aneurysm (AAA) screening.** / A one-time screening for ages 67 to 80 years who are current or former smokers.  Skin self-exam. / Monthly.  Influenza vaccine. / Every year.  Tetanus, diphtheria, and acellular pertussis (Tdap/Td) vaccine.** / 1 dose of Td every 10 years.  Varicella vaccine.** / Consult your health care provider.  Zoster vaccine.** / 1 dose for adults aged 25 years or older.  Pneumococcal 13-valent conjugate (PCV13) vaccine.** / Consult your health care provider.  Pneumococcal polysaccharide (PPSV23)  vaccine.** / 1 dose for all adults aged 70 years and older.  Meningococcal vaccine.** / Consult your health care provider.  Hepatitis A vaccine.** / Consult your health care provider.  Hepatitis B vaccine.** / Consult your health care provider.  Haemophilus influenzae type b (Hib) vaccine.** / Consult your health care provider. **Family history and personal history of risk and conditions may change your health care provider's recommendations. Document Released: 03/25/2001 Document Revised: 02/01/2013 Document Reviewed: 06/24/2010 [facility] Patient Information 2015 [place], [place]. This information is not intended to replace advice given to you by your health care provider. Make sure you discuss any questions you have with your health care provider.

## 2015-05-18 NOTE — Assessment & Plan Note (Addendum)
DM: Continue Lotensin, check A1c HTN: Continue Lotensin, Plendil, Lopressor. Check a CMP Hyperlipidemia: On Lipitor, check labs. COPD: Essentially asymptomatic, declined a Prevnar, not ready to quit tobacco. Osteoporosis: Recommend calcium, vitamin D. Declined a DEXA before B12 deficiency: Check labs, encourage  at least PO supplements, if very low we will start injections  . Carotid disease, AAA: Offered referral for [place], declined a carotid but agreed to do a AAA [place] (afraid of risk of rupture)  needs to be in this location  Colonoscopy? Chronic diarrhea constipation, if she has anemia may recommend a GI eval (likes to see a different group). If sx are stable, no further testing is needed in my opinion.Marland Kitchen RTC 4 months

## 2015-05-31 ENCOUNTER — Ambulatory Visit (HOSPITAL_BASED_OUTPATIENT_CLINIC_OR_DEPARTMENT_OTHER)
Admission: RE | Admit: 2015-05-31 | Discharge: 2015-05-31 | Disposition: A | Discharge status: Home / Self Care | Payer: Medicare Other | Source: Ambulatory Visit | Attending: Internal Medicine | Admitting: Internal Medicine

## 2015-05-31 DIAGNOSIS — I714 Abdominal aortic aneurysm, without rupture: Secondary | ICD-10-CM | POA: Diagnosis not present

## 2015-05-31 DIAGNOSIS — Z136 Encounter for screening for cardiovascular disorders: Secondary | ICD-10-CM | POA: Diagnosis not present

## 2015-05-31 DIAGNOSIS — E785 Hyperlipidemia, unspecified: Secondary | ICD-10-CM | POA: Diagnosis not present

## 2015-06-05 ENCOUNTER — Encounter: Payer: Self-pay | Admitting: *Deleted

## 2015-06-05 ENCOUNTER — Encounter: Payer: Self-pay | Admitting: Internal Medicine

## 2015-07-03 ENCOUNTER — Other Ambulatory Visit: Payer: Self-pay

## 2015-07-03 MED ORDER — ATORVASTATIN CALCIUM 40 MG PO TABS: 40.0000 mg | ORAL_TABLET | Freq: Every day | ORAL | Status: DC

## 2015-08-20 ENCOUNTER — Other Ambulatory Visit: Payer: Self-pay

## 2015-08-20 MED ORDER — FELODIPINE ER 10 MG PO TB24: 10.0000 mg | ORAL_TABLET | Freq: Every day | ORAL | Status: DC

## 2015-09-06 DIAGNOSIS — B029 Zoster without complications: Secondary | ICD-10-CM | POA: Diagnosis not present

## 2015-10-01 ENCOUNTER — Encounter: Payer: Self-pay | Admitting: Internal Medicine

## 2015-10-01 ENCOUNTER — Ambulatory Visit (INDEPENDENT_AMBULATORY_CARE_PROVIDER_SITE_OTHER): Payer: Medicare Other | Admitting: Internal Medicine

## 2015-10-01 VITALS — BP 118/72 | HR 67 | Temp 98.2°F | Resp 12 | Ht 65.0 in | Wt 115.1 lb

## 2015-10-01 DIAGNOSIS — M6281 Muscle weakness (generalized): Secondary | ICD-10-CM

## 2015-10-01 DIAGNOSIS — R198 Other specified symptoms and signs involving the digestive system and abdomen: Secondary | ICD-10-CM

## 2015-10-01 DIAGNOSIS — R7303 Prediabetes: Secondary | ICD-10-CM | POA: Diagnosis not present

## 2015-10-01 DIAGNOSIS — I10 Essential (primary) hypertension: Secondary | ICD-10-CM | POA: Diagnosis not present

## 2015-10-01 DIAGNOSIS — R29898 Other symptoms and signs involving the musculoskeletal system: Secondary | ICD-10-CM

## 2015-10-01 NOTE — Assessment & Plan Note (Addendum)
Prediabetes: Diet control, doing well.  HTN: Continue Lotensin, Plendil, Lopressor. Check a BMP Leg weaknes as described above, DDX PVD (most likely), spinal stenosis, s/e statins, others. Will check ABI, check a CK, sedimentation rate and TSH.Parking permit signed Chronic diarrhea constipation: Request a GI referral, will do, needs to see somebody near by the  [facility] [place] RTC 3 months

## 2015-10-01 NOTE — Progress Notes (Signed)
Pre visit review using our clinic review tool, if applicable. No additional management support is needed unless otherwise documented below in the visit note. 

## 2015-10-01 NOTE — Patient Instructions (Signed)
GO TO THE LAB : Get the blood work     GO TO THE FRONT DESK Schedule your next appointment for a  checkup in 3 months  

## 2015-10-01 NOTE — Progress Notes (Signed)
Subjective:    Patient ID: Crystal Osborne, female    DOB: [birth year], 82 y.o.   MRN: [id number]  DOS:  10/01/2015 Type of visit - description : Routine office visit Interval history: Her main concern today is leg pain: for the last 2 months she is developing leg weakness with walking, when she reached 30-40 yards, her legs "simply buckle". Denies pain per se, no injuries or falls. Also needs a parking permit. Continue with GI symptoms: Constipation and diarrhea on and off, problems with hemorrhoids including burning and itching. Still smoking, not ready to quit. Insomnia, does not require Xanax anymore   Review of Systems Denies lower extremity paresthesias No bladder or bowel incontinence No headaches, unusual myalgias, no persistent back pain  Past Medical History:  Diagnosis Date  . AAA (abdominal aortic aneurysm) [facility])    sees Dr Eden Lathe   . B12 deficiency   . Breast cancer ([place]) 1960  . CAROTID ARTERY DISEASE 11/28/2008   sees Dr Oneida Alar   . COPD (chronic obstructive pulmonary disease) ([place]) 01/25/2014  . CVA (cerebrovascular accident) ([place]) 2006  . Diabetes mellitus ([place]) 05/29/2010   A1c 6.0 on October 2011   . Hyperlipidemia   . Hypertension   . Osteoporosis 2003  . Shingles    repeated episone 9/11 went to a UC  . Transient hypothyroidism    d/t thyroidectomy  . UTI (lower urinary tract infection) May 2013    Past Surgical History:  Procedure Laterality Date  . ABDOMINAL HYSTERECTOMY     no oophorectomy  . MASTECTOMY, RADICAL     right, [place] (60s)  . THYROIDECTOMY      Social History   Social History  . Marital status: Single    Spouse name: N/A  . Number of children: 3  . Years of education: N/A   Occupational History  . retired     Social History Main Topics  . Smoking status: Current Every Day Smoker    Types: Cigarettes  . Smokeless tobacco: Never Used     Comment: 1 ppd  . Alcohol use 0.0 oz/week     Comment: very rarely   . Drug use:  No  . Sexual activity: Not on file   Other Topics Concern  . Not on file   Social History Narrative   Lives by herself   Has 2 children in [place], 1 son in [place]   17 y/o son, disabled by stroke (09-2011)      Son w/ bone cancer    Still drives   Has a sister that lives in the [place], [place], [place] )--died 2022-05-30                Medication List       Accurate as of 10/01/15  5:21 PM. Always use your most recent med list.          AMBULATORY NON FORMULARY MEDICATION Right Breast Prosthesis  Dx: V15.29   aspirin 81 MG tablet Take 81 mg by mouth daily.   atorvastatin 40 MG tablet Commonly known as:  LIPITOR Take 1 tablet (40 mg total) by mouth daily.   augmented betamethasone dipropionate 0.05 % ointment Commonly known as:  DIPROLENE-AF Apply topically 2 (two) times daily.   benazepril 20 MG tablet Commonly known as:  LOTENSIN Take 1 tablet (20 mg total) by mouth 2 (two) times daily.   CENTRUM SILVER Chew Chew by mouth.   cyanocobalamin 1000 MCG/ML injection Commonly known as:  (  VITAMIN B-12) Inject 1 mL (1,000 mcg total) into the muscle every 30 (thirty) days.   felodipine 10 MG 24 hr tablet Commonly known as:  PLENDIL Take 1 tablet (10 mg total) by mouth daily.   metoprolol 50 MG tablet Commonly known as:  LOPRESSOR Take 1 tablet (50 mg total) by mouth 2 (two) times daily.          Objective:   Physical Exam BP 118/72 (BP Location: Left Arm, Patient Position: Sitting, Cuff Size: Normal)   Pulse 67   Temp 98.2 F (36.8 C) (Oral)   Resp 12   Ht 5\' 5"  (1.651 m)   Wt 115 lb 2 oz (52.2 kg)   SpO2 98%   BMI 19.16 kg/m  General:   Well developed, well nourished . NAD.  HEENT:  Normocephalic . Face symmetric, atraumatic Lungs:  Decreased breath sounds Normal respiratory effort, no intercostal retractions, no accessory muscle use. Heart: RRR,  no murmur.  no pretibial edema bilaterally  Unable to find femoral or pedal pulses. Good  capillary refill Abdomen:  Not distended, soft, non-tender. No rebound or rigidity. + Bruit upper abdomen  Skin: Not pale. Not jaundice Neurologic:  alert & oriented X3.  Speech normal, gait appropriate for age and unassisted Psych--  Cognition and judgment appear intact.  Cooperative with normal attention span and concentration.  Behavior appropriate. No anxious or depressed appearing.    Assessment & Plan:   Assessment > Prediabetes HTN Hyperlipidemia Insomnia-- rarely has xanax  COPD Osteoporosis 2003-- refused dexas consistently  B12 def (185 05-2013) CV: ---CVA 2006 ---Carotid artery dz--last [place] 09-2011 40-59% B- ICA, declined further testing ---AAA --last [place]  05-2015, 1 year Shingles 10-2009 and ~ 07-2015 (seen elsewhere, L face) Status post thyroidectomy, transient hypothyroidism Breast cancer 1960  PLAN  Prediabetes: Diet control, doing well.  HTN: Continue Lotensin, Plendil, Lopressor. Check a BMP Leg weaknes as described above, DDX PVD (most likely), spinal stenosis, s/e statins, others. Will check ABI, check a CK, sedimentation rate and TSH.Parking permit signed Chronic diarrhea constipation: Request a GI referral, will do, needs to see somebody near by the  [facility] [place] RTC 3 months

## 2015-10-04 DIAGNOSIS — R194 Change in bowel habit: Secondary | ICD-10-CM | POA: Diagnosis not present

## 2015-10-10 ENCOUNTER — Telehealth: Payer: Self-pay | Admitting: Internal Medicine

## 2015-10-10 NOTE — Telephone Encounter (Signed)
LM to schedule AWV with RN ° °

## 2015-10-16 ENCOUNTER — Other Ambulatory Visit: Payer: Self-pay | Admitting: Internal Medicine

## 2015-10-16 DIAGNOSIS — E1351 Other specified diabetes mellitus with diabetic peripheral angiopathy without gangrene: Secondary | ICD-10-CM

## 2015-10-16 DIAGNOSIS — R29898 Other symptoms and signs involving the musculoskeletal system: Secondary | ICD-10-CM

## 2015-10-22 ENCOUNTER — Other Ambulatory Visit: Payer: Self-pay | Admitting: Internal Medicine

## 2015-10-22 ENCOUNTER — Ambulatory Visit (HOSPITAL_COMMUNITY)
Admission: RE | Admit: 2015-10-22 | Discharge: 2015-10-22 | Disposition: A | Discharge status: Home / Self Care | Payer: Medicare Other | Source: Ambulatory Visit | Attending: Cardiology | Admitting: Cardiology

## 2015-10-22 DIAGNOSIS — Z720 Tobacco use: Secondary | ICD-10-CM | POA: Diagnosis not present

## 2015-10-22 DIAGNOSIS — I10 Essential (primary) hypertension: Secondary | ICD-10-CM | POA: Insufficient documentation

## 2015-10-22 DIAGNOSIS — E1351 Other specified diabetes mellitus with diabetic peripheral angiopathy without gangrene: Secondary | ICD-10-CM | POA: Diagnosis not present

## 2015-10-22 DIAGNOSIS — I70203 Unspecified atherosclerosis of native arteries of extremities, bilateral legs: Secondary | ICD-10-CM | POA: Diagnosis not present

## 2015-10-22 DIAGNOSIS — E785 Hyperlipidemia, unspecified: Secondary | ICD-10-CM | POA: Insufficient documentation

## 2015-10-22 DIAGNOSIS — I700 Atherosclerosis of aorta: Secondary | ICD-10-CM | POA: Diagnosis not present

## 2015-10-22 DIAGNOSIS — I708 Atherosclerosis of other arteries: Secondary | ICD-10-CM | POA: Diagnosis not present

## 2015-10-22 DIAGNOSIS — R29898 Other symptoms and signs involving the musculoskeletal system: Secondary | ICD-10-CM | POA: Diagnosis not present

## 2015-10-22 DIAGNOSIS — I739 Peripheral vascular disease, unspecified: Secondary | ICD-10-CM | POA: Diagnosis not present

## 2015-10-25 ENCOUNTER — Other Ambulatory Visit: Payer: Self-pay

## 2015-10-25 DIAGNOSIS — I999 Unspecified disorder of circulatory system: Secondary | ICD-10-CM

## 2015-10-29 ENCOUNTER — Ambulatory Visit (INDEPENDENT_AMBULATORY_CARE_PROVIDER_SITE_OTHER): Payer: Medicare Other | Admitting: Behavioral Health

## 2015-10-29 ENCOUNTER — Other Ambulatory Visit: Payer: Medicare Other

## 2015-10-29 DIAGNOSIS — Z23 Encounter for immunization: Secondary | ICD-10-CM | POA: Diagnosis not present

## 2015-10-29 LAB — SEDIMENTATION RATE: Sed Rate: 19 mm/hr (ref 0–30)

## 2015-10-29 LAB — BASIC METABOLIC PANEL
BUN: 16 mg/dL (ref 6–23)
CALCIUM: 9.2 mg/dL (ref 8.4–10.5)
CO2: 27 mEq/L (ref 19–32)
CREATININE: 0.87 mg/dL (ref 0.40–1.20)
Chloride: 106 mEq/L (ref 96–112)
GFR: 66.2 mL/min (ref >60.00)
GLUCOSE: 101 mg/dL — AB (ref 70–99)
Potassium: 4.1 mEq/L (ref 3.5–5.1)
Sodium: 141 mEq/L (ref 135–145)

## 2015-10-29 LAB — CK: Total CK: 41 U/L (ref 7–177)

## 2015-10-29 LAB — TSH: TSH: 4.36 u[IU]/mL (ref 0.35–4.50)

## 2015-10-29 NOTE — Progress Notes (Addendum)
Pre visit review using our clinic review tool, if applicable. No additional management support is needed unless otherwise documented below in the visit note.  Patient in clinic today for Influenza vaccination. IM given in Left Deltoid. Patient tolerated injection well.

## 2015-11-02 ENCOUNTER — Other Ambulatory Visit: Payer: Self-pay | Admitting: Internal Medicine

## 2015-11-09 ENCOUNTER — Encounter (INDEPENDENT_AMBULATORY_CARE_PROVIDER_SITE_OTHER): Payer: Self-pay

## 2015-11-09 ENCOUNTER — Ambulatory Visit (INDEPENDENT_AMBULATORY_CARE_PROVIDER_SITE_OTHER): Payer: Medicare Other | Admitting: Internal Medicine

## 2015-11-09 ENCOUNTER — Encounter: Payer: Self-pay | Admitting: Internal Medicine

## 2015-11-09 ENCOUNTER — Ambulatory Visit
Admission: RE | Admit: 2015-11-09 | Discharge: 2015-11-09 | Disposition: A | Discharge status: Home / Self Care | Payer: Medicare Other | Source: Ambulatory Visit | Attending: Cardiovascular Disease | Admitting: Cardiovascular Disease

## 2015-11-09 ENCOUNTER — Ambulatory Visit (INDEPENDENT_AMBULATORY_CARE_PROVIDER_SITE_OTHER): Payer: Medicare Other | Admitting: Cardiovascular Disease

## 2015-11-09 ENCOUNTER — Encounter: Payer: Self-pay | Admitting: Cardiovascular Disease

## 2015-11-09 ENCOUNTER — Other Ambulatory Visit: Payer: Self-pay | Admitting: Family Medicine

## 2015-11-09 ENCOUNTER — Other Ambulatory Visit: Payer: Self-pay | Admitting: *Deleted

## 2015-11-09 VITALS — BP 122/76 | HR 56 | Temp 97.9°F | Resp 14 | Ht 65.0 in | Wt 114.5 lb

## 2015-11-09 VITALS — BP 118/64 | HR 53 | Ht 65.0 in | Wt 113.6 lb

## 2015-11-09 DIAGNOSIS — I739 Peripheral vascular disease, unspecified: Secondary | ICD-10-CM | POA: Diagnosis not present

## 2015-11-09 DIAGNOSIS — Z01818 Encounter for other preprocedural examination: Secondary | ICD-10-CM

## 2015-11-09 DIAGNOSIS — J441 Chronic obstructive pulmonary disease with (acute) exacerbation: Secondary | ICD-10-CM

## 2015-11-09 DIAGNOSIS — I714 Abdominal aortic aneurysm, without rupture, unspecified: Secondary | ICD-10-CM

## 2015-11-09 DIAGNOSIS — R0989 Other specified symptoms and signs involving the circulatory and respiratory systems: Secondary | ICD-10-CM

## 2015-11-09 DIAGNOSIS — J449 Chronic obstructive pulmonary disease, unspecified: Secondary | ICD-10-CM | POA: Diagnosis not present

## 2015-11-09 DIAGNOSIS — I10 Essential (primary) hypertension: Secondary | ICD-10-CM

## 2015-11-09 DIAGNOSIS — I779 Disorder of arteries and arterioles, unspecified: Secondary | ICD-10-CM

## 2015-11-09 MED ORDER — AZITHROMYCIN 250 MG PO TABS: ORAL_TABLET | ORAL | 0 refills | Status: DC

## 2015-11-09 MED ORDER — ALBUTEROL SULFATE HFA 108 (90 BASE) MCG/ACT IN AERS: 2.0000 | INHALATION_SPRAY | Freq: Four times a day (QID) | RESPIRATORY_TRACT | 1 refills | Status: DC | PRN

## 2015-11-09 NOTE — Progress Notes (Signed)
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 She was referred by Dr. Larose Kells for peripheral vascular evaluation because of lifestyle limiting claudication. Her risk factor profile is notable for continued tobacco abuse of one half pack per day having smoked 35 pack years. She has treated hypertension and hyperlipidemia. There is a family history of heart disease. She has never had a heart attack but that has had a TIA remotely. She denies chest pain or shortness of breath. She's had bilateral lower extremity claudication right slightly worse than left which began 3 months ago. Dopplers performed in our office 10/22/15 revealed a right ABI of 0.68 and a left ABI 0.78. She did have high frequency signals in the origins of both iliac arteries.   Current Outpatient Prescriptions  Medication Sig Dispense Refill  . AMBULATORY NON FORMULARY MEDICATION Right Breast Prosthesis  Dx: V15.29 1 Device 0  . aspirin 81 MG tablet Take 81 mg by mouth daily.    Marland Kitchen atorvastatin (LIPITOR) 40 MG tablet Take 1 tablet (40 mg total) by mouth daily. 90 tablet 1  . benazepril (LOTENSIN) 20 MG tablet Take 1 tablet (20 mg total) by mouth 2 (two) times daily. 180 tablet 1  . felodipine (PLENDIL) 10 MG 24 hr tablet Take 1 tablet (10 mg total) by mouth daily. 90 tablet 0  . metoprolol (LOPRESSOR) 50 MG tablet Take 1 tablet (50 mg total) by mouth 2 (two) times daily. 180 tablet 1  . Multiple Vitamins-Minerals (CENTRUM SILVER) CHEW Chew by mouth.       No current facility-administered medications for this visit.     Allergies  Allergen Reactions  . Ciprofloxacin Hcl Itching  . Septra [Sulfamethoxazole-Trimethoprim] Itching  . Codeine Rash    Made  her feel very strange    Social History   Social History  . Marital status: Single    Spouse name: N/A  . Number of children: 3  . Years of education: N/A   Occupational History  . retired     Social History Main Topics  . Smoking status: Current Every Day Smoker    Types: Cigarettes  . Smokeless tobacco: Never Used     Comment: 1 ppd  . Alcohol use 0.0 oz/week     Comment: very rarely   . Drug use: No  . Sexual activity: Not on file   Other Topics Concern  . Not on file   Social History Narrative   Lives by herself   Has 2 children in [place], 1 son in [place]   49 y/o son, disabled by stroke (09-2011)      Son w/ bone cancer    Still drives   Has a sister that lives in the [place] ( [place], [place] )--died 06-17-22             Review of Systems: General: negative for chills, fever, night sweats or weight changes.  Cardiovascular: negative for chest pain, dyspnea on exertion, edema, orthopnea, palpitations, paroxysmal nocturnal dyspnea or shortness of breath Dermatological: negative for rash Respiratory: negative for cough or wheezing Urologic: negative for hematuria Abdominal: negative for nausea, vomiting, diarrhea, bright red blood per rectum, melena, or hematemesis Neurologic: negative for visual changes, syncope, or dizziness  All other systems reviewed and are otherwise negative except as noted above.    Blood pressure 118/64, pulse (!) 53, height 5\' 5"  (1.651 m), weight 113 lb 9.6 oz (51.5 kg).  General appearance: alert and no distress Neck: no adenopathy, no JVD, supple, symmetrical, trachea midline, thyroid not enlarged, symmetric, no tenderness/mass/nodules and Loud bilateral carotid bruits Lungs: clear to auscultation bilaterally Heart: regular rate and rhythm, S1, S2 normal, no murmur, click, rub or gallop Extremities: extremities normal, atraumatic, no cyanosis or edema and 1+ right femoral, 2+ left femoral with a soft left femoral bruit  EKG sinus  bradycardia 53 with evidence of LVH with repolarization changes. I personally reviewed this EKG  ASSESSMENT AND PLAN:   PERIPHERAL VASCULAR DISEASE Crystal Osborne was referred to me by Dr. Larose Kells for vascular evaluation because of progressive lifestyle limiting claudication. She has a history of treated hypertension, hyperlipidemia and a long history of tobacco abuse. She smoked one half pack a day for the last 70 years. She's never had a heart attack. She's had a TIA 10 years ago. There is a family history of heart disease. She's noticed claudication. 3 months which is bilateral right slightly greater than left. She is unable to perform her daily activities. She had Dopplers performed in the office 10/22/15 which revealed a right ABI of 0.68 and a left ABI of 0.78. She had high frequency signals in the origin of both iliac arteries. I can palpate femoral pulses. She has no demonstrable disease below that. I think she is a good candidate for angiography and potential intervention.  Essential hypertension History of hypertension blood pressure measured 118/64. She is on Lotensin and Plendil as well as Lopressor. Continue current meds at current dosing  Hyperlipidemia History of hyperlipidemia on statin therapy with recent lipid profile performed 05/18/15 revealed total cholesterol 138, LDL 71 and HDL 49  TOBACCO ABUSE History of 35 pack years of tobacco abuse currently smoking one half pack per day recalcitrant to risk factor modification  Carotid artery disease History of monitor bilateral ICA stenosis by duplex ultrasound 09/25/11. She does have loud bilateral carotid bruits. We'll repeat carotid Doppler studies.      Lorretta Harp MD FACP,FACC,FAHA, [facility] 11/09/2015 9:28 AM

## 2015-11-09 NOTE — Assessment & Plan Note (Addendum)
Bronchitis,COPD exacerbation : Heavy smoker with history of COPD, c/o increased respiratory symptoms likely due to bronchitis. Vital signs are stable. Plan: Z-Pak, Mucinex, start albuterol as needed. She is allergic to codeine. Call if no better, see AVS PVD:  Since the last time she was here ABIs were positive, saw cardiology, they are planning a procedure. Chronic diarrhea constipation: Saw GI, they recommended conservative treatment. RTC 2 months

## 2015-11-09 NOTE — Progress Notes (Signed)
Subjective:    Patient ID: Crystal Osborne, female    DOB: 1932-02-02, 82 y.o.   MRN: [id number]  DOS:  11/09/2015 Type of visit - description : acute Interval history: Got a flu shot last week, few days later she started feeling poorly: Malaise, chest congestion, dry cough, having a hard time going to sleep. Has been taking Mucinex without much help. Also, was seen by cardiology and GI: Notes reviewed.   Review of Systems  Appetite is a slightly decreased Denies chest pain but short of breath is a slightly above baseline particularly with cough. + Wheezing. She continue to smoke, not ready to quit.  Past Medical History:  Diagnosis Date  . AAA (abdominal aortic aneurysm) [facility])    sees Dr Eden Lathe   . B12 deficiency   . Breast cancer ([place]) 1960  . CAROTID ARTERY DISEASE 11/28/2008   sees Dr Oneida Alar   . COPD (chronic obstructive pulmonary disease) ([place]) 01/25/2014  . CVA (cerebrovascular accident) ([place]) 2006  . Diabetes mellitus ([place]) 05/29/2010   A1c 6.0 on October 2011   . Hyperlipidemia   . Hypertension   . Osteoporosis 2003  . Shingles    repeated episone 9/11 went to a UC  . Transient hypothyroidism    d/t thyroidectomy  . UTI (lower urinary tract infection) May 2013    Past Surgical History:  Procedure Laterality Date  . ABDOMINAL HYSTERECTOMY     no oophorectomy  . MASTECTOMY, RADICAL     right, [place] (60s)  . THYROIDECTOMY      Social History   Social History  . Marital status: Single    Spouse name: N/A  . Number of children: 3  . Years of education: N/A   Occupational History  . retired     Social History Main Topics  . Smoking status: Current Every Day Smoker    Types: Cigarettes  . Smokeless tobacco: Never Used     Comment: 1 ppd  . Alcohol use 0.0 oz/week     Comment: very rarely   . Drug use: No  . Sexual activity: Not on file   Other Topics Concern  . Not on file   Social History Narrative   Lives by herself   Has 2 children in  [place], 1 son in [place]   45 y/o son, disabled by stroke (09-2011)      Son w/ bone cancer    Still drives   Has a sister that lives in the [place], [place], [place] )--died 05/30/2022                Medication List       Accurate as of 11/09/15  2:49 PM. Always use your most recent med list.          AMBULATORY NON FORMULARY MEDICATION Right Breast Prosthesis  Dx: V15.29   aspirin 81 MG tablet Take 81 mg by mouth daily.   atorvastatin 40 MG tablet Commonly known as:  LIPITOR Take 1 tablet (40 mg total) by mouth daily.   benazepril 20 MG tablet Commonly known as:  LOTENSIN Take 1 tablet (20 mg total) by mouth 2 (two) times daily.   CENTRUM SILVER Chew Chew by mouth.   felodipine 10 MG 24 hr tablet Commonly known as:  PLENDIL Take 1 tablet (10 mg total) by mouth daily.   metoprolol 50 MG tablet Commonly known as:  LOPRESSOR Take 1 tablet (50 mg total) by mouth 2 (two) times daily.  Objective:   Physical Exam BP 122/76 (BP Location: Left Arm, Patient Position: Sitting, Cuff Size: Small)   Pulse (!) 56   Temp 97.9 F (36.6 C) (Oral)   Resp 14   Ht 5\' 5"  (1.651 m)   Wt 114 lb 8 oz (51.9 kg)   SpO2 92%   BMI 19.05 kg/m  General:   Well developed, well nourished . NAD.  HEENT:  Normocephalic . Face symmetric, atraumatic. TMs normal, nose is slightly congested, sinuses no TTP. Throat symmetric and not depressed. Lungs:  Few rhonchi and end expiratory wheezing with cough. No crackles. Normal respiratory effort, no intercostal retractions, no accessory muscle use. Heart: RRR,  no murmur.  No pretibial edema bilaterally  Skin: Not pale. Not jaundice Neurologic:  alert & oriented X3.  Speech normal, gait appropriate for age and unassisted Psych--  Cognition and judgment appear intact.  Cooperative with normal attention span and concentration.  Behavior appropriate. No anxious or depressed appearing.      Assessment & Plan:   Assessment  > Prediabetes HTN Hyperlipidemia Insomnia-- rarely has xanax  COPD: smoker, XR suggestive of dx, no PFTs Osteoporosis 2003-- refused dexas consistently  B12 def (185 05-2013) CV: ---CVA 2006 ---Carotid artery dz--last [place] 09-2011 40-59% B- ICA, declined further testing ---AAA --last [place]  05-2015, 1 year -- PVD, + ABIs 10-2015 Shingles 10-2009 and ~ 07-2015 (seen elsewhere, L face) Status post thyroidectomy, transient hypothyroidism Breast cancer 1960  PLAN  Bronchitis, COPD exacerbation: Heavy smoker with history of COPD, c/o increased respiratory symptoms likely due to bronchitis. Vital signs are stable. Plan: Z-Pak, Mucinex, start albuterol as needed. She is allergic to codeine. Call if no better, see AVS PVD:  Since the last time she was here ABIs were positive, saw cardiology, they are planning a procedure. Chronic diarrhea constipation: Saw GI, they recommended conservative treatment. RTC 2 months

## 2015-11-09 NOTE — Addendum Note (Signed)
Addended by: Vanessa Ralphs on: 11/09/2015 10:26 AM   Modules accepted: Orders

## 2015-11-09 NOTE — Patient Instructions (Signed)
Medication Instructions:  NO CHANGES.   Testing/Procedures: Your physician has requested that you have a carotid duplex. This test is an ultrasound of the carotid arteries in your neck. It looks at blood flow through these arteries that supply the brain with blood. Allow one hour for this exam. There are no restrictions or special instructions.  PRIOR TO PV ANGIOGRAM.  Your physician has requested that you have an abdominal aorta duplex. During this test, an ultrasound is used to evaluate the aorta. Allow 30 minutes for this exam. Do not eat after midnight the day before and avoid carbonated beverages.   PRIOR TO PV ANGIOGRAM.  Dr. Gwenlyn Found has ordered a peripheral angiogram to be done at [facility].  This procedure is going to look at the bloodflow in your lower extremities.  If Dr. Gwenlyn Found is able to open up the arteries, you will have to spend one night in the hospital.  If he is not able to open the arteries, you will be able to go home that same day.    After the procedure, you will not be allowed to drive for 3 days or push, pull, or lift anything greater than 10 lbs for one week.    You will be required to have the following tests prior to the procedure:  1. Blood work-the blood work can be done no more than 14 days prior to the procedure.  It can be done at any [facility] lab.  There is one downstairs on the first floor of this building and one in the [facility] building [id number])  2. Chest Xray-the chest xray order has already been placed at the [facility].     *REPS  ERIC  Puncture site RIGHT GROIN    If you need a refill on your cardiac medications before your next appointment, please call your pharmacy.

## 2015-11-09 NOTE — Assessment & Plan Note (Signed)
History of hyperlipidemia on statin therapy with recent lipid profile performed 05/18/15 revealed total cholesterol 138, LDL 71 and HDL 49

## 2015-11-09 NOTE — Assessment & Plan Note (Signed)
[name] was referred to me by Dr. Larose Kells for vascular evaluation because of progressive lifestyle limiting claudication. She has a history of treated hypertension, hyperlipidemia and a long history of tobacco abuse. She smoked one half pack a day for the last 70 years. She's never had a heart attack. She's had a TIA 10 years ago. There is a family history of heart disease. She's noticed claudication. 3 months which is bilateral right slightly greater than left. She is unable to perform her daily activities. She had Dopplers performed in the office 10/22/15 which revealed a right ABI of 0.68 and a left ABI of 0.78. She had high frequency signals in the origin of both iliac arteries. I can palpate femoral pulses. She has no demonstrable disease below that. I think she is a good candidate for angiography and potential intervention.

## 2015-11-09 NOTE — Assessment & Plan Note (Signed)
History of hypertension blood pressure measured 118/64. She is on Lotensin and Plendil as well as Lopressor. Continue current meds at current dosing

## 2015-11-09 NOTE — Progress Notes (Signed)
Pre visit review using our clinic review tool, if applicable. No additional management support is needed unless otherwise documented below in the visit note. 

## 2015-11-09 NOTE — Patient Instructions (Signed)
   GO TO THE FRONT DESK Schedule your next appointment for a  routine checkup with me in 2 months  Schedule labs to be done at your convenience before the procedure with Dr. Gwenlyn Found   ==================================================  Rest, fluids , tylenol  For cough:  Take Mucinex DM twice a day as needed until better  Avoid decongestants such as  Pseudoephedrine or phenylephrine    Use albuterol: 2 tabs every 6 hours for wheezing, severe cough or chest congestion   Take the antibiotic as prescribed  (zithromax)  Call if not gradually better over the next  10 days  Call anytime if the symptoms are severe

## 2015-11-09 NOTE — Assessment & Plan Note (Signed)
History of monitor bilateral ICA stenosis by duplex ultrasound 09/25/11. She does have loud bilateral carotid bruits. We'll repeat carotid Doppler studies.

## 2015-11-09 NOTE — Assessment & Plan Note (Signed)
History of 35 pack years of tobacco abuse currently smoking one half pack per day recalcitrant to risk factor modification

## 2015-11-15 ENCOUNTER — Encounter (HOSPITAL_COMMUNITY): Payer: Medicare Other

## 2015-11-17 ENCOUNTER — Ambulatory Visit (HOSPITAL_BASED_OUTPATIENT_CLINIC_OR_DEPARTMENT_OTHER)
Admission: RE | Admit: 2015-11-17 | Discharge: 2015-11-17 | Disposition: A | Discharge status: Home / Self Care | Payer: Medicare Other | Source: Ambulatory Visit | Attending: Cardiovascular Disease | Admitting: Cardiovascular Disease

## 2015-11-17 DIAGNOSIS — R0989 Other specified symptoms and signs involving the circulatory and respiratory systems: Secondary | ICD-10-CM | POA: Diagnosis not present

## 2015-11-17 DIAGNOSIS — I6523 Occlusion and stenosis of bilateral carotid arteries: Secondary | ICD-10-CM | POA: Insufficient documentation

## 2015-11-17 DIAGNOSIS — I10 Essential (primary) hypertension: Secondary | ICD-10-CM | POA: Diagnosis not present

## 2015-11-17 DIAGNOSIS — I723 Aneurysm of iliac artery: Secondary | ICD-10-CM | POA: Diagnosis not present

## 2015-11-17 DIAGNOSIS — I714 Abdominal aortic aneurysm, without rupture, unspecified: Secondary | ICD-10-CM

## 2015-11-17 DIAGNOSIS — I779 Disorder of arteries and arterioles, unspecified: Secondary | ICD-10-CM

## 2015-11-17 DIAGNOSIS — I739 Peripheral vascular disease, unspecified: Secondary | ICD-10-CM

## 2015-11-20 ENCOUNTER — Telehealth: Payer: Self-pay | Admitting: *Deleted

## 2015-11-20 DIAGNOSIS — I739 Peripheral vascular disease, unspecified: Principal | ICD-10-CM

## 2015-11-20 DIAGNOSIS — I714 Abdominal aortic aneurysm, without rupture, unspecified: Secondary | ICD-10-CM

## 2015-11-20 DIAGNOSIS — I779 Disorder of arteries and arterioles, unspecified: Secondary | ICD-10-CM

## 2015-11-20 NOTE — Telephone Encounter (Signed)
-----   Message from Lorretta Harp, MD sent at 11/17/2015  4:48 PM EDT ----- Small AAA without signif change from prior study. Repeat 24 months

## 2015-11-20 NOTE — Telephone Encounter (Signed)
Results called to pt. Pt verbalized understanding. Repeat order entered for Aorta and Carotids dopplers. ------

## 2015-11-21 ENCOUNTER — Other Ambulatory Visit: Payer: Self-pay | Admitting: Internal Medicine

## 2015-11-26 ENCOUNTER — Encounter: Payer: Self-pay | Admitting: Internal Medicine

## 2015-11-26 ENCOUNTER — Ambulatory Visit (INDEPENDENT_AMBULATORY_CARE_PROVIDER_SITE_OTHER): Payer: Medicare Other | Admitting: Internal Medicine

## 2015-11-26 VITALS — BP 118/68 | HR 56 | Temp 97.7°F | Resp 14 | Ht 65.0 in | Wt 116.0 lb

## 2015-11-26 DIAGNOSIS — I739 Peripheral vascular disease, unspecified: Secondary | ICD-10-CM

## 2015-11-26 LAB — CBC WITH DIFFERENTIAL/PLATELET
BASOS ABS: 89 {cells}/uL (ref 0–200)
Basophils Relative: 1 %
EOS ABS: 178 {cells}/uL (ref 15–500)
EOS PCT: 2 %
HCT: 38.8 % (ref 35.0–45.0)
HEMOGLOBIN: 12.4 g/dL (ref 11.7–15.5)
LYMPHS ABS: 3204 {cells}/uL (ref 850–3900)
Lymphocytes Relative: 36 %
MCH: 30.4 pg (ref 27.0–33.0)
MCHC: 32 g/dL (ref 32.0–36.0)
MCV: 95.1 fL (ref 80.0–100.0)
MONOS PCT: 8 %
MPV: 9.6 fL (ref 7.5–12.5)
Monocytes Absolute: 712 cells/uL (ref 200–950)
NEUTROS ABS: 4717 {cells}/uL (ref 1500–7800)
NEUTROS PCT: 53 %
Platelets: 258 10*3/uL (ref 140–400)
RBC: 4.08 MIL/uL (ref 3.80–5.10)
RDW: 14.3 % (ref 11.0–15.0)
WBC: 8.9 10*3/uL (ref 3.8–10.8)

## 2015-11-26 LAB — APTT: aPTT: 27 s (ref 22–34)

## 2015-11-26 LAB — PROTIME-INR
INR: 1
PROTHROMBIN TIME: 11.1 s (ref 9.0–11.5)

## 2015-11-26 NOTE — Progress Notes (Signed)
Pre visit review using our clinic review tool, if applicable. No additional management support is needed unless otherwise documented below in the visit note. 

## 2015-11-26 NOTE — Progress Notes (Signed)
Subjective:    Patient ID: Crystal Osborne, female    DOB: Oct 31, 1931, 82 y.o.   MRN: [id number]  DOS:  11/26/2015 Type of visit - description :  Interval history: Patient only needed labs.   Review of Systems   Past Medical History:  Diagnosis Date  . AAA (abdominal aortic aneurysm) [facility])    sees Dr Eden Lathe   . B12 deficiency   . Breast cancer ([place]) 1960  . CAROTID ARTERY DISEASE 11/28/2008   sees Dr Oneida Alar   . COPD (chronic obstructive pulmonary disease) ([place]) 01/25/2014  . CVA (cerebrovascular accident) ([place]) 2006  . Diabetes mellitus ([place]) 05/29/2010   A1c 6.0 on October 2011   . Hyperlipidemia   . Hypertension   . Osteoporosis 2003  . Shingles    repeated episone 9/11 went to a UC  . Transient hypothyroidism    d/t thyroidectomy  . UTI (lower urinary tract infection) May 2013    Past Surgical History:  Procedure Laterality Date  . ABDOMINAL HYSTERECTOMY     no oophorectomy  . MASTECTOMY, RADICAL     right, [place] (60s)  . THYROIDECTOMY      Social History   Social History  . Marital status: Single    Spouse name: N/A  . Number of children: 3  . Years of education: N/A   Occupational History  . retired     Social History Main Topics  . Smoking status: Current Every Day Smoker    Types: Cigarettes  . Smokeless tobacco: Never Used     Comment: 1 ppd  . Alcohol use 0.0 oz/week     Comment: very rarely   . Drug use: No  . Sexual activity: Not on file   Other Topics Concern  . Not on file   Social History Narrative   Lives by herself   Has 2 children in [place], 1 son in [place]   32 y/o son, disabled by stroke (09-2011)      Son w/ bone cancer    Still drives   Has a sister that lives in the [place], [place], [place] )--died 2022-06-06                Medication List       Accurate as of 11/26/15  2:20 PM. Always use your most recent med list.          albuterol 108 (90 Base) MCG/ACT inhaler Commonly known as:  VENTOLIN HFA Inhale 2  puffs into the lungs every 6 (six) hours as needed for wheezing or shortness of breath.   AMBULATORY NON FORMULARY MEDICATION Right Breast Prosthesis  Dx: [id number]   aspirin 81 MG tablet Take 81 mg by mouth daily.   atorvastatin 40 MG tablet Commonly known as:  LIPITOR Take 1 tablet (40 mg total) by mouth daily.   azithromycin 250 MG tablet Commonly known as:  ZITHROMAX Z-PAK 2 tabs a day the first day, then 1 tab a day x 4 days   benazepril 20 MG tablet Commonly known as:  LOTENSIN Take 1 tablet (20 mg total) by mouth 2 (two) times daily.   CENTRUM SILVER Chew Chew by mouth.   felodipine 10 MG 24 hr tablet Commonly known as:  PLENDIL take 1 tablet by mouth once daily   metoprolol 50 MG tablet Commonly known as:  LOPRESSOR Take 1 tablet (50 mg total) by mouth 2 (two) times daily.          Objective:  Physical Exam BP 118/68 (BP Location: Left Arm, Patient Position: Sitting, Cuff Size: Small)   Pulse (!) 56   Temp 97.7 F (36.5 C) (Oral)   Resp 14   Ht 5\' 5"  (1.651 m)   Wt 116 lb (52.6 kg)   SpO2 96%   BMI 19.30 kg/m      Assessment & Plan:

## 2015-11-27 LAB — BASIC METABOLIC PANEL
BUN: 18 mg/dL (ref 7–25)
CALCIUM: 9 mg/dL (ref 8.6–10.4)
CO2: 23 mmol/L (ref 20–31)
CREATININE: 0.94 mg/dL — AB (ref 0.60–0.88)
Chloride: 108 mmol/L (ref 98–110)
GLUCOSE: 91 mg/dL (ref 65–99)
Potassium: 4.6 mmol/L (ref 3.5–5.3)
SODIUM: 142 mmol/L (ref 135–146)

## 2015-12-03 ENCOUNTER — Encounter (HOSPITAL_COMMUNITY): Payer: Self-pay | Admitting: General Practice

## 2015-12-03 ENCOUNTER — Ambulatory Visit (HOSPITAL_COMMUNITY)
Admission: RE | Admit: 2015-12-03 | Discharge: 2015-12-05 | Disposition: A | Discharge status: Home Health | Payer: Medicare Other | Source: Ambulatory Visit | Attending: Cardiovascular Disease | Admitting: Cardiovascular Disease

## 2015-12-03 ENCOUNTER — Encounter (HOSPITAL_COMMUNITY)
Admission: RE | Disposition: A | Discharge status: Home / Self Care | Payer: Self-pay | Source: Ambulatory Visit | Attending: Cardiovascular Disease

## 2015-12-03 DIAGNOSIS — Z8673 Personal history of transient ischemic attack (TIA), and cerebral infarction without residual deficits: Secondary | ICD-10-CM | POA: Insufficient documentation

## 2015-12-03 DIAGNOSIS — E785 Hyperlipidemia, unspecified: Secondary | ICD-10-CM | POA: Insufficient documentation

## 2015-12-03 DIAGNOSIS — I959 Hypotension, unspecified: Secondary | ICD-10-CM | POA: Insufficient documentation

## 2015-12-03 DIAGNOSIS — I2510 Atherosclerotic heart disease of native coronary artery without angina pectoris: Secondary | ICD-10-CM | POA: Diagnosis not present

## 2015-12-03 DIAGNOSIS — I70291 Other atherosclerosis of native arteries of extremities, right leg: Secondary | ICD-10-CM | POA: Diagnosis not present

## 2015-12-03 DIAGNOSIS — F17210 Nicotine dependence, cigarettes, uncomplicated: Secondary | ICD-10-CM | POA: Diagnosis not present

## 2015-12-03 DIAGNOSIS — I6523 Occlusion and stenosis of bilateral carotid arteries: Secondary | ICD-10-CM | POA: Diagnosis not present

## 2015-12-03 DIAGNOSIS — I70213 Atherosclerosis of native arteries of extremities with intermittent claudication, bilateral legs: Secondary | ICD-10-CM | POA: Diagnosis not present

## 2015-12-03 DIAGNOSIS — I70212 Atherosclerosis of native arteries of extremities with intermittent claudication, left leg: Secondary | ICD-10-CM | POA: Diagnosis not present

## 2015-12-03 DIAGNOSIS — D62 Acute posthemorrhagic anemia: Secondary | ICD-10-CM

## 2015-12-03 DIAGNOSIS — Z95820 Peripheral vascular angioplasty status with implants and grafts: Secondary | ICD-10-CM | POA: Insufficient documentation

## 2015-12-03 DIAGNOSIS — R001 Bradycardia, unspecified: Secondary | ICD-10-CM | POA: Insufficient documentation

## 2015-12-03 DIAGNOSIS — Z79899 Other long term (current) drug therapy: Secondary | ICD-10-CM | POA: Diagnosis not present

## 2015-12-03 DIAGNOSIS — I739 Peripheral vascular disease, unspecified: Secondary | ICD-10-CM | POA: Diagnosis present

## 2015-12-03 DIAGNOSIS — Z8249 Family history of ischemic heart disease and other diseases of the circulatory system: Secondary | ICD-10-CM | POA: Insufficient documentation

## 2015-12-03 DIAGNOSIS — Z7982 Long term (current) use of aspirin: Secondary | ICD-10-CM | POA: Insufficient documentation

## 2015-12-03 DIAGNOSIS — Z7902 Long term (current) use of antithrombotics/antiplatelets: Secondary | ICD-10-CM | POA: Diagnosis not present

## 2015-12-03 HISTORY — DX: Pneumonia, unspecified organism: J18.9

## 2015-12-03 HISTORY — DX: Cystocele, unspecified: N81.10

## 2015-12-03 HISTORY — PX: PERIPHERAL VASCULAR CATHETERIZATION: SHX172C

## 2015-12-03 HISTORY — DX: Malignant neoplasm of unspecified site of right female breast: C50.911

## 2015-12-03 HISTORY — DX: Transient cerebral ischemic attack, unspecified: G45.9

## 2015-12-03 LAB — POCT ACTIVATED CLOTTING TIME
ACTIVATED CLOTTING TIME: 213 s
ACTIVATED CLOTTING TIME: 164 s
Activated Clotting Time: 246 seconds
Activated Clotting Time: 235 seconds

## 2015-12-03 LAB — GLUCOSE, CAPILLARY: Glucose-Capillary: 88 mg/dL (ref 65–99)

## 2015-12-03 SURGERY — ABDOMINAL AORTOGRAM

## 2015-12-03 MED ORDER — LIDOCAINE HCL (PF) 1 % IJ SOLN
INTRAMUSCULAR | Status: DC | PRN
  Administered 2015-12-03: 25 mL

## 2015-12-03 MED ORDER — FENTANYL CITRATE (PF) 100 MCG/2ML IJ SOLN
INTRAMUSCULAR | Status: DC | PRN
  Administered 2015-12-03 (×2): 25 ug via INTRAVENOUS

## 2015-12-03 MED ORDER — CLOPIDOGREL BISULFATE 300 MG PO TABS
ORAL_TABLET | ORAL | Status: AC
  Filled 2015-12-03: qty 1

## 2015-12-03 MED ORDER — ONDANSETRON HCL 4 MG/2ML IJ SOLN
4.0000 mg | Freq: Four times a day (QID) | INTRAMUSCULAR | Status: DC | PRN
  Administered 2015-12-03: 18:00:00 4 mg via INTRAVENOUS
  Filled 2015-12-03 (×2): qty 2

## 2015-12-03 MED ORDER — VERAPAMIL HCL 2.5 MG/ML IV SOLN
INTRAVENOUS | Status: AC
  Filled 2015-12-03: qty 2

## 2015-12-03 MED ORDER — ASPIRIN 81 MG PO CHEW
CHEWABLE_TABLET | ORAL | Status: AC
  Administered 2015-12-03: 81 mg via ORAL
  Filled 2015-12-03: qty 1

## 2015-12-03 MED ORDER — ACETAMINOPHEN 325 MG PO TABS: 650.0000 mg | ORAL_TABLET | ORAL | Status: DC | PRN

## 2015-12-03 MED ORDER — NITROGLYCERIN IN D5W 200-5 MCG/ML-% IV SOLN
INTRAVENOUS | Status: AC
  Filled 2015-12-03: qty 250

## 2015-12-03 MED ORDER — MORPHINE SULFATE (PF) 2 MG/ML IV SOLN
2.0000 mg | INTRAVENOUS | Status: DC | PRN
Start: 2015-12-03 — End: 2015-12-05
  Administered 2015-12-03: 2 mg via INTRAVENOUS
  Filled 2015-12-03: qty 1

## 2015-12-03 MED ORDER — ASPIRIN EC 81 MG PO TBEC
81.0000 mg | DELAYED_RELEASE_TABLET | Freq: Every day | ORAL | Status: DC
  Administered 2015-12-04 – 2015-12-05 (×2): 81 mg via ORAL
  Filled 2015-12-03 (×2): qty 1

## 2015-12-03 MED ORDER — HEPARIN (PORCINE) IN NACL 2-0.9 UNIT/ML-% IJ SOLN
INTRAMUSCULAR | Status: AC
  Filled 2015-12-03: qty 1000

## 2015-12-03 MED ORDER — SODIUM CHLORIDE 0.9 % WEIGHT BASED INFUSION: 1.0000 mL/kg/h | INTRAVENOUS | Status: DC

## 2015-12-03 MED ORDER — SODIUM CHLORIDE 0.9% FLUSH: 3.0000 mL | INTRAVENOUS | Status: DC | PRN

## 2015-12-03 MED ORDER — HYDRALAZINE HCL 20 MG/ML IJ SOLN: 10.0000 mg | INTRAMUSCULAR | Status: DC | PRN

## 2015-12-03 MED ORDER — SODIUM CHLORIDE 0.9 % IV SOLN: INTRAVENOUS | Status: DC

## 2015-12-03 MED ORDER — LIDOCAINE HCL (PF) 1 % IJ SOLN
INTRAMUSCULAR | Status: AC
  Filled 2015-12-03: qty 30

## 2015-12-03 MED ORDER — NITROGLYCERIN 1 MG/10 ML FOR IR/CATH LAB
INTRA_ARTERIAL | Status: DC | PRN
  Administered 2015-12-03 (×2): 200 ug via INTRA_ARTERIAL

## 2015-12-03 MED ORDER — IODIXANOL 320 MG/ML IV SOLN
INTRAVENOUS | Status: DC | PRN
  Administered 2015-12-03: 205 mL via INTRA_ARTERIAL

## 2015-12-03 MED ORDER — ASPIRIN 81 MG PO CHEW
81.0000 mg | CHEWABLE_TABLET | ORAL | Status: AC
  Administered 2015-12-03: 81 mg via ORAL

## 2015-12-03 MED ORDER — SODIUM CHLORIDE 0.9 % WEIGHT BASED INFUSION
3.0000 mL/kg/h | INTRAVENOUS | Status: DC
  Administered 2015-12-03: 3 mL/kg/h via INTRAVENOUS

## 2015-12-03 MED ORDER — FENTANYL CITRATE (PF) 100 MCG/2ML IJ SOLN
INTRAMUSCULAR | Status: AC
  Filled 2015-12-03: qty 2

## 2015-12-03 MED ORDER — VIPERSLIDE LUBRICANT OPTIME
TOPICAL | Status: DC | PRN
  Administered 2015-12-03 (×2): via SURGICAL_CAVITY

## 2015-12-03 MED ORDER — HEPARIN SODIUM (PORCINE) 1000 UNIT/ML IJ SOLN
INTRAMUSCULAR | Status: DC | PRN
  Administered 2015-12-03: 3000 [IU] via INTRAVENOUS
  Administered 2015-12-03: 6000 [IU] via INTRAVENOUS

## 2015-12-03 MED ORDER — CLOPIDOGREL BISULFATE 75 MG PO TABS
75.0000 mg | ORAL_TABLET | Freq: Every day | ORAL | Status: DC
  Administered 2015-12-04 – 2015-12-05 (×2): 75 mg via ORAL
  Filled 2015-12-03 (×2): qty 1

## 2015-12-03 MED ORDER — CLOPIDOGREL BISULFATE 300 MG PO TABS
ORAL_TABLET | ORAL | Status: DC | PRN
  Administered 2015-12-03: 300 mg via ORAL

## 2015-12-03 MED ORDER — HEPARIN (PORCINE) IN NACL 2-0.9 UNIT/ML-% IJ SOLN
INTRAMUSCULAR | Status: DC | PRN
  Administered 2015-12-03: 1000 mL

## 2015-12-03 SURGICAL SUPPLY — 32 items
BALLN ARMADA 4X40X80 (BALLOONS) ×4
BALLN ARMADA 5X40X80 (BALLOONS) ×4
BALLN ARMADA 6X20X80 (BALLOONS) ×4
BALLOON ARMADA 4X40X80 (BALLOONS) IMPLANT
BALLOON ARMADA 5X40X80 (BALLOONS) IMPLANT
BALLOON ARMADA 6X20X80 (BALLOONS) IMPLANT
CATH ANGIO 5F BER2 65CM (CATHETERS) ×2 IMPLANT
CATH ANGIO 5F PIGTAIL 65CM (CATHETERS) ×2 IMPLANT
CATH SOFT-VU ST 4F 90CM (CATHETERS) ×2 IMPLANT
CATH STRAIGHT 5FR 65CM (CATHETERS) ×2 IMPLANT
DIAMONDBACK SOLID OAS 2.0MM (CATHETERS) ×4
GUIDEWIRE ANGLED .035X150CM (WIRE) ×2 IMPLANT
KIT ENCORE 26 ADVANTAGE (KITS) ×4 IMPLANT
KIT PV (KITS) ×4 IMPLANT
LUBRICANT VIPERSLIDE CORONARY (MISCELLANEOUS) ×2 IMPLANT
NEEDLE FERGUSON 1842 16D (NEEDLE) ×2 IMPLANT
SHEATH BRITE TIP 7FR 35CM (SHEATH) ×4 IMPLANT
SHEATH PINNACLE 5F 10CM (SHEATH) ×2 IMPLANT
SHEATH PINNACLE 7F 10CM (SHEATH) ×4 IMPLANT
STENT LIFESTREAM 7X58X80 (Permanent Stent) ×4 IMPLANT
STOPCOCK MORSE 400PSI 3WAY (MISCELLANEOUS) ×2 IMPLANT
SYRINGE MEDRAD AVANTA MACH 7 (SYRINGE) ×2 IMPLANT
SYSTEM DIMNDBCK SLD OAS 2.0MM (CATHETERS) IMPLANT
TAPE RADIOPAQUE TURBO (MISCELLANEOUS) ×4 IMPLANT
TRANSDUCER W/STOPCOCK (MISCELLANEOUS) ×4 IMPLANT
TRAY PV CATH (CUSTOM PROCEDURE TRAY) ×4 IMPLANT
TUBING CIL FLEX 10 FLL-RA (TUBING) ×2 IMPLANT
WIRE AMPLATZ SSTIFF .035X260CM (WIRE) ×4 IMPLANT
WIRE HI TORQ VERSACORE J 260CM (WIRE) ×2 IMPLANT
WIRE HITORQ VERSACORE ST 145CM (WIRE) ×2 IMPLANT
WIRE VERSACORE LOC 115CM (WIRE) ×2 IMPLANT
WIRE VIPER ADVANCE .017X335CM (WIRE) ×2 IMPLANT

## 2015-12-03 NOTE — Progress Notes (Signed)
Site area: right groin  Site Prior to Removal:  Level 0  Pressure Applied For 20 MINUTES    Minutes Beginning at 1845  Manual:   Yes.    Patient Status During Pull:  AAO X 4  Post Pull Groin Site:  Level 0  Post Pull Instructions Given:  Yes.    Post Pull Pulses Present:  Yes.    Dressing Applied:  Yes.    Comments:  Tolerated procedure well

## 2015-12-03 NOTE — Interval H&P Note (Signed)
History and Physical Interval Note:  12/03/2015 12:58 PM  Crystal Osborne  has presented today for surgery, with the diagnosis of claudication  The various methods of treatment have been discussed with the patient and family. After consideration of risks, benefits and other options for treatment, the patient has consented to  Procedure(s): Lower Extremity Intervention (N/A) as a surgical intervention .  The patient's history has been reviewed, patient examined, no change in status, stable for surgery.  I have reviewed the patient's chart and labs.  Questions were answered to the patient's satisfaction.     Quay Burow

## 2015-12-03 NOTE — Care Management Note (Signed)
Case Management Note  Patient Details  Name: Crystal Osborne MRN: [id number] Date of Birth: [birth year]  Subjective/Objective:    S/p pv intervention, will be on plavix, NCM will cont to follow for dc needs.                Action/Plan:   Expected Discharge Date:                  Expected Discharge Plan:  Home/Self Care  In-House Referral:     Discharge planning Services  CM Consult  Post Acute Care Choice:    Choice offered to:     DME Arranged:    DME Agency:     HH Arranged:    HH Agency:     Status of Service:  In process, will continue to follow  If discussed at Long Length of Stay Meetings, dates discussed:    Additional Comments:  Zenon Mayo, RN 12/03/2015, 5:56 PM

## 2015-12-03 NOTE — H&P (View-Only) (Signed)
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 She was referred by Crystal Osborne for peripheral vascular evaluation because of lifestyle limiting claudication. Her risk factor profile is notable for continued tobacco abuse of one half pack per day having smoked 35 pack years. She has treated hypertension and hyperlipidemia. There is a family history of heart disease. She has never had a heart attack but that has had a TIA remotely. She denies chest pain or shortness of breath. She's had bilateral lower extremity claudication right slightly worse than left which began 3 months ago. Dopplers performed in our office 10/22/15 revealed a right ABI of 0.68 and a left ABI 0.78. She did have high frequency signals in the origins of both iliac arteries.   Current Outpatient Prescriptions  Medication Sig Dispense Refill  . AMBULATORY NON FORMULARY MEDICATION Right Breast Prosthesis  Dx: V15.29 1 Device 0  . aspirin 81 MG tablet Take 81 mg by mouth daily.    Marland Kitchen atorvastatin (LIPITOR) 40 MG tablet Take 1 tablet (40 mg total) by mouth daily. 90 tablet 1  . benazepril (LOTENSIN) 20 MG tablet Take 1 tablet (20 mg total) by mouth 2 (two) times daily. 180 tablet 1  . felodipine (PLENDIL) 10 MG 24 hr tablet Take 1 tablet (10 mg total) by mouth daily. 90 tablet 0  . metoprolol (LOPRESSOR) 50 MG tablet Take 1 tablet (50 mg total) by mouth 2 (two) times daily. 180 tablet 1  . Multiple Vitamins-Minerals (CENTRUM SILVER) CHEW Chew by mouth.       No current facility-administered medications for this visit.     Allergies  Allergen Reactions  . Ciprofloxacin Hcl Itching  . Septra [Sulfamethoxazole-Trimethoprim] Itching  . Codeine Rash    Made  her feel very strange    Social History   Social History  . Marital status: Single    Spouse name: N/A  . Number of children: 3  . Years of education: N/A   Occupational History  . retired     Social History Main Topics  . Smoking status: Current Every Day Smoker    Types: Cigarettes  . Smokeless tobacco: Never Used     Comment: 1 ppd  . Alcohol use 0.0 oz/week     Comment: very rarely   . Drug use: No  . Sexual activity: Not on file   Other Topics Concern  . Not on file   Social History Narrative   Lives by herself   Has 2 children in [place], 1 son in [place]   71 y/o son, disabled by stroke (09-2011)      Son w/ bone cancer    Still drives   Has a sister that lives in the [place] ( [place], [place] )--died 06/10/22             Review of Systems: General: negative for chills, fever, night sweats or weight changes.  Cardiovascular: negative for chest pain, dyspnea on exertion, edema, orthopnea, palpitations, paroxysmal nocturnal dyspnea or shortness of breath Dermatological: negative for rash Respiratory: negative for cough or wheezing Urologic: negative for hematuria Abdominal: negative for nausea, vomiting, diarrhea, bright red blood per rectum, melena, or hematemesis Neurologic: negative for visual changes, syncope, or dizziness  All other systems reviewed and are otherwise negative except as noted above.    Blood pressure 118/64, pulse (!) 53, height 5\' 5"  (1.651 m), weight 113 lb 9.6 oz (51.5 kg).  General appearance: alert and no distress Neck: no adenopathy, no JVD, supple, symmetrical, trachea midline, thyroid not enlarged, symmetric, no tenderness/mass/nodules and Loud bilateral carotid bruits Lungs: clear to auscultation bilaterally Heart: regular rate and rhythm, S1, S2 normal, no murmur, click, rub or gallop Extremities: extremities normal, atraumatic, no cyanosis or edema and 1+ right femoral, 2+ left femoral with a soft left femoral bruit  EKG sinus  bradycardia 53 with evidence of LVH with repolarization changes. I personally reviewed this EKG  ASSESSMENT AND PLAN:   PERIPHERAL VASCULAR DISEASE Crystal Osborne was referred to me by Crystal Osborne for vascular evaluation because of progressive lifestyle limiting claudication. She has a history of treated hypertension, hyperlipidemia and a long history of tobacco abuse. She smoked one half pack a day for the last 70 years. She's never had a heart attack. She's had a TIA 10 years ago. There is a family history of heart disease. She's noticed claudication. 3 months which is bilateral right slightly greater than left. She is unable to perform her daily activities. She had Dopplers performed in the office 10/22/15 which revealed a right ABI of 0.68 and a left ABI of 0.78. She had high frequency signals in the origin of both iliac arteries. I can palpate femoral pulses. She has no demonstrable disease below that. I think she is a good candidate for angiography and potential intervention.  Essential hypertension History of hypertension blood pressure measured 118/64. She is on Lotensin and Plendil as well as Lopressor. Continue current meds at current dosing  Hyperlipidemia History of hyperlipidemia on statin therapy with recent lipid profile performed 05/18/15 revealed total cholesterol 138, LDL 71 and HDL 49  TOBACCO ABUSE History of 35 pack years of tobacco abuse currently smoking one half pack per day recalcitrant to risk factor modification  Carotid artery disease History of monitor bilateral ICA stenosis by duplex ultrasound 09/25/11. She does have loud bilateral carotid bruits. We'll repeat carotid Doppler studies.      Crystal Osborne, [facility] 11/09/2015 9:28 AM

## 2015-12-03 NOTE — Progress Notes (Signed)
Site area: left groin  Site Prior to Removal:  Level 0  Pressure Applied For 35 MINUTES    Minutes Beginning; 1920  Manual:   Yes.    Patient Status During Pull:  AAO X 4  Post Pull Groin Site:  Level 0  Post Pull Instructions Given:  Yes.    Post Pull Pulses Present:  Yes.    Dressing Applied:  Yes.    Comments:  Extra pressure hold time due to bleeding. Tolerated procedure well

## 2015-12-04 ENCOUNTER — Encounter (HOSPITAL_COMMUNITY): Payer: Self-pay | Admitting: Cardiovascular Disease

## 2015-12-04 ENCOUNTER — Ambulatory Visit (HOSPITAL_COMMUNITY): Payer: Medicare Other

## 2015-12-04 ENCOUNTER — Other Ambulatory Visit: Payer: Self-pay | Admitting: Physician Assistant

## 2015-12-04 DIAGNOSIS — R269 Unspecified abnormalities of gait and mobility: Secondary | ICD-10-CM | POA: Diagnosis not present

## 2015-12-04 DIAGNOSIS — Z95820 Peripheral vascular angioplasty status with implants and grafts: Secondary | ICD-10-CM | POA: Diagnosis not present

## 2015-12-04 DIAGNOSIS — Z7902 Long term (current) use of antithrombotics/antiplatelets: Secondary | ICD-10-CM | POA: Diagnosis not present

## 2015-12-04 DIAGNOSIS — I10 Essential (primary) hypertension: Secondary | ICD-10-CM

## 2015-12-04 DIAGNOSIS — I6523 Occlusion and stenosis of bilateral carotid arteries: Secondary | ICD-10-CM | POA: Diagnosis not present

## 2015-12-04 DIAGNOSIS — I739 Peripheral vascular disease, unspecified: Secondary | ICD-10-CM | POA: Diagnosis not present

## 2015-12-04 DIAGNOSIS — Z8673 Personal history of transient ischemic attack (TIA), and cerebral infarction without residual deficits: Secondary | ICD-10-CM | POA: Diagnosis not present

## 2015-12-04 DIAGNOSIS — I70291 Other atherosclerosis of native arteries of extremities, right leg: Secondary | ICD-10-CM | POA: Diagnosis not present

## 2015-12-04 DIAGNOSIS — Z8249 Family history of ischemic heart disease and other diseases of the circulatory system: Secondary | ICD-10-CM | POA: Diagnosis not present

## 2015-12-04 DIAGNOSIS — Z79899 Other long term (current) drug therapy: Secondary | ICD-10-CM | POA: Diagnosis not present

## 2015-12-04 DIAGNOSIS — D62 Acute posthemorrhagic anemia: Secondary | ICD-10-CM

## 2015-12-04 DIAGNOSIS — E785 Hyperlipidemia, unspecified: Secondary | ICD-10-CM | POA: Diagnosis not present

## 2015-12-04 DIAGNOSIS — I70212 Atherosclerosis of native arteries of extremities with intermittent claudication, left leg: Secondary | ICD-10-CM | POA: Diagnosis not present

## 2015-12-04 DIAGNOSIS — Z7982 Long term (current) use of aspirin: Secondary | ICD-10-CM | POA: Diagnosis not present

## 2015-12-04 DIAGNOSIS — K8020 Calculus of gallbladder without cholecystitis without obstruction: Secondary | ICD-10-CM | POA: Diagnosis not present

## 2015-12-04 DIAGNOSIS — I959 Hypotension, unspecified: Secondary | ICD-10-CM | POA: Diagnosis not present

## 2015-12-04 DIAGNOSIS — I2510 Atherosclerotic heart disease of native coronary artery without angina pectoris: Secondary | ICD-10-CM | POA: Diagnosis not present

## 2015-12-04 DIAGNOSIS — R001 Bradycardia, unspecified: Secondary | ICD-10-CM | POA: Diagnosis not present

## 2015-12-04 LAB — CBC
HEMATOCRIT: 29.4 % — AB (ref 36.0–46.0)
Hemoglobin: 9.6 g/dL — ABNORMAL LOW (ref 12.0–15.0)
MCH: 30.8 pg (ref 26.0–34.0)
MCHC: 32.7 g/dL (ref 30.0–36.0)
MCV: 94.2 fL (ref 78.0–100.0)
PLATELETS: 193 10*3/uL (ref 150–400)
RBC: 3.12 MIL/uL — ABNORMAL LOW (ref 3.87–5.11)
RDW: 14.3 % (ref 11.5–15.5)
WBC: 12.9 10*3/uL — AB (ref 4.0–10.5)

## 2015-12-04 LAB — BASIC METABOLIC PANEL
Anion gap: 6 (ref 5–15)
BUN: 19 mg/dL (ref 6–20)
CO2: 21 mmol/L — ABNORMAL LOW (ref 22–32)
CREATININE: 0.93 mg/dL (ref 0.44–1.00)
Calcium: 8 mg/dL — ABNORMAL LOW (ref 8.9–10.3)
Chloride: 111 mmol/L (ref 101–111)
GFR calc Af Amer: >60 mL/min (ref >60)
GFR, EST NON AFRICAN AMERICAN: 56 mL/min — AB (ref >60)
GLUCOSE: 118 mg/dL — AB (ref 65–99)
POTASSIUM: 3.9 mmol/L (ref 3.5–5.1)
SODIUM: 138 mmol/L (ref 135–145)

## 2015-12-04 LAB — HEMOGLOBIN AND HEMATOCRIT, BLOOD
HEMATOCRIT: 24.1 % — AB (ref 36.0–46.0)
HEMATOCRIT: 25.7 % — AB (ref 36.0–46.0)
HEMOGLOBIN: 7.9 g/dL — AB (ref 12.0–15.0)
Hemoglobin: 8.4 g/dL — ABNORMAL LOW (ref 12.0–15.0)

## 2015-12-04 MED ORDER — METOPROLOL TARTRATE 50 MG PO TABS: 25.0000 mg | ORAL_TABLET | Freq: Two times a day (BID) | ORAL | 1 refills | Status: DC

## 2015-12-04 MED ORDER — SODIUM CHLORIDE 0.9 % IV BOLUS (SEPSIS)
500.0000 mL | Freq: Once | INTRAVENOUS | Status: AC
  Administered 2015-12-04: 12:00:00 500 mL via INTRAVENOUS

## 2015-12-04 MED ORDER — CLOPIDOGREL BISULFATE 75 MG PO TABS: 75.0000 mg | ORAL_TABLET | Freq: Every day | ORAL | 11 refills | Status: DC

## 2015-12-04 NOTE — Discharge Summary (Signed)
Discharge Summary    Patient ID: Crystal Osborne,  MRN: [id number], DOB/AGE: [birth year] 82 y.o.  Admit date: 12/03/2015 Discharge date: 12/04/2015  Primary Care Provider: Kathlene November Primary Cardiologist: Dr Gwenlyn Found  Discharge Diagnoses    Active Problems:   Claudication [facility])  Allergies Allergies  Allergen Reactions  . Ciprofloxacin Hcl Itching  . Septra [Sulfamethoxazole-Trimethoprim] Itching  . Codeine Rash    Made her feel very strange    Diagnostic Studies/Procedures    Peripheral Vascular Cath and PTA Angiographic Data:  1: Abdominal aorta-fluoroscopically calcified 2: Left lower extremity-95% calcified ostial left common iliac artery stenosis. There was three-vessel runoff 3: Right lower extremity-90% calcified ostial right common iliac artery stenosis with three-vessel runoff IMPRESSION: Crystal Osborne has 95% calcified bilateral ostial common iliac artery stenosis with lifestyle limiting claudication. Will proceed with diamondback orbital rotational atherectomy of both iliac artery ostia followed by PTA and covered stenting. Procedure Description: The patient received 9000 units heparin intravenously with an ACT of 235. Total contrast administered the patient was 205 mL. Both right and left common femoral arteries were accessed with 7 French bright tip sheath. I was able to get across the right common iliac artery ostia with a bursa core wire and 5 [place] end hole catheter. I exchanged for a 014 Viper wire. I then performed orbital atherectomy with a 2 mm burr up to 120,000 rpm's. Following this I performed a similar procedure on the left common iliac artery I then predilated both iliac ostia with 4 mm x 4 cm balloons and the right ostia I upgraded to a 6 mm x 2 cm balloon. Because of inability to pass a stent on the right side I exchanged my Versicore  wire for an Amplatz wire. Following this I placed a 7 mm x 58 mm long Lifestream covered stent in the origin of both iliac  arteries carefully positioning the stent angiographically and fluoroscopically. I simultaneously deployed this using "kissing stent technique" at 8-10 atm each resulting reduction of a 95% calcified ostial bilateral iliac artery stenoses to 0% residual. The patient tolerated the procedure well. Both bright tip sheath were exchanged over wires for short 7 French sheath which were then secured in place. The patient received 300 mg of by mouth Plavix at the end of the case. Final Impression: Successful bilateral high-grade calcified ostial iliac stenosis diamondback orbital rotational atherectomy, kissing covered stents with excellent antegrade result. The patient tolerated the procedure well. The sheaths will be removed and pressure held once the ACT falls below 170. She'll be hydrated overnight and discharged home in the morning on dual antiplatelet therapy. We will get lower extremity arterial Doppler studies in our [facility] office next week and I will see her back several weeks thereafter. She left the lab in stable condition. _____________   History of Present Illness     82 yo female w/ hx tob use, HTN, HLD, FH CAD, TIA. Referred to Dr Gwenlyn Found for claudication symptoms. PV cath scheduled and she came in for this on 10/23.  Hospital Course     Consultants: none   Procedure results are above. She had PTA/stenting of bilateral iliac arteries. She tolerated the procedure well.   Post-procedure, she was weak and deconditioned from not having ambulated much in months. She was evaluated by physical therapy and may benefit from using a walker.   Her cath site had some ecchymosis and a small hematoma on the R. Bilateral bruits noted. Distal pulses intact. She was  in sinus bradycardia with HR in the 40s at times but was asymptomatic. Pt states this is normal for her. Has not had metoprolol 50 mg bid since admission and HR low at baseline so will decrease dose.   Dr Martinique evaluated Crystal Osborne on 10/24 and  reviewed all data. She will need home PT, but is otherwise doing well and is considered stable for discharge, to follow up as an outpatient. _____________  Discharge Vitals Blood pressure (!) 134/98, pulse 74, temperature 97 F (36.1 C), temperature source Oral, resp. rate 20, height _0  (1.651 m), weight 113 lb 15.7 oz (51.7 kg), SpO2 93 %.  Filed Weights   12/03/15 1031 12/04/15 0311  Weight: 115 lb (52.2 kg) 113 lb 15.7 oz (51.7 kg)  General: Well developed, well nourished, female in no acute distress Head: Eyes PERRLA, No xanthomas.   Normocephalic and atraumatic  Lungs: few dry rales bilaterally to auscultation. Heart: HRRR S1 S2, without MRG.  Pulses are 2+ & equal. No JVD. Abdomen: Bowel sounds are present, abdomen soft and non-tender without masses or  hernias noted. Msk: Normal strength and tone for age. Extremities: No clubbing, cyanosis or edema.  Bilateral femoral bruits appreciated. Ecchymosis and small hematoma on L, minimal ecchymosis on R  Skin:  No rashes or lesions noted. Neuro: Alert and oriented X 3. Psych:  Good affect, responds appropriately   Labs & Radiologic Studies    CBC  Recent Labs  12/04/15 0325  WBC 12.9*  HGB 9.6*  HCT 29.4*  MCV 94.2  PLT 277   Basic Metabolic Panel  Recent Labs  12/04/15 0325  NA 138  K 3.9  CL 111  CO2 21*  GLUCOSE 118*  BUN 19  CREATININE 0.93  CALCIUM 8.0*   _____________   Disposition   Pt is being discharged home today in good condition.  Follow-up Plans & Appointments    Follow-up Information    [facility] Follow up on 12/11/2015.   Specialty:  Cardiology Why:  Ultrasound study (Dopplers) of your legs at 12:30 pm. Please arrive 15 minutes early for paperwork Contact information: [place] [phone]       Rosaria Ferries, PA-C Follow up on 12/24/2015.   Specialties:  Cardiology, Radiology Why:  See provider at 10:00 am,  please arrive 15 minutes early for paperwork. Contact information: [address] [place] [address] [phone]          Discharge Instructions    Diet - low sodium heart healthy    Complete by:  As directed    Increase activity slowly    Complete by:  As directed       Discharge Medications   Current Discharge Medication List    START taking these medications   Details  clopidogrel (PLAVIX) 75 MG tablet Take 1 tablet (75 mg total) by mouth daily with breakfast. Qty: 30 tablet, Refills: 11      CONTINUE these medications which have CHANGED   Details  metoprolol (LOPRESSOR) 50 MG tablet Take 0.5 tablets (25 mg total) by mouth 2 (two) times daily. Qty: 180 tablet, Refills: 1      CONTINUE these medications which have NOT CHANGED   Details  acetaminophen (TYLENOL) 500 MG tablet Take 500 mg by mouth 2 (two) times daily as needed (for pain.).    AMBULATORY NON FORMULARY MEDICATION Right Breast Prosthesis  Dx: V15.29 Qty: 1 Device, Refills: 0  aspirin EC 81 MG tablet Take 81 mg by mouth every evening.    atorvastatin (LIPITOR) 40 MG tablet Take 1 tablet (40 mg total) by mouth daily. Qty: 90 tablet, Refills: 1    benazepril (LOTENSIN) 20 MG tablet Take 1 tablet (20 mg total) by mouth 2 (two) times daily. Qty: 180 tablet, Refills: 1    felodipine (PLENDIL) 10 MG 24 hr tablet take 1 tablet by mouth once daily Qty: 90 tablet, Refills: 0    vitamin B-12 (CYANOCOBALAMIN) 500 MCG tablet Take 500 mcg by mouth every evening.    triamcinolone cream (KENALOG) 0.1 % Apply 1 application topically 2 (two) times daily as needed (for itching.).      STOP taking these medications     albuterol (VENTOLIN HFA) 108 (90 Base) MCG/ACT inhaler      azithromycin (ZITHROMAX Z-PAK) 250 MG tablet           Outstanding Labs/Studies   None  Duration of Discharge Encounter   Greater than 30 minutes including physician time.  Jonetta Speak  NP 12/04/2015, 8:55 AM  Patient seen and examined and history reviewed. Agree with above findings and plan. Patient is still weak. Able to ambulate with walker short distance. No claudication. Right groin without hematoma. Left groin with small soft hematoma. Hgb dropped 12.4 to 9.6 post procedure. Plan to DC home today. Son will be staying with her for care. Will arrange home health PT for strengthening. Given bradycardia will reduce metoprolol dose by half. Follow up as noted above.  Alithia Zavaleta Martin[place], [place] 12/04/2015 9:50 AM

## 2015-12-04 NOTE — Progress Notes (Signed)
Pt resting, routine v/s checked, noted with  Sb/p ranging on the  80's , confirmed with manual b/p check. Denies discomfort " just tired " as stated by  pt. Rhonda Barrett PA notified, IV bolus 500 cc  given , stat H/H ordered. Rt and left groin site level 1( bruise, soft). SB/p  Improved to low 100's  Post IV bolus infusion.awaiting lab draw. monitored closely.

## 2015-12-04 NOTE — Evaluation (Signed)
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 She was referred by Dr. Larose Kells for peripheral vascular evaluation because of lifestyle limiting claudication. Now s/p peripheral catheterization with the following performed: Abdominal aortogram/bilateral iliac angiogram/bifemoral runoff, Diamondback orbital rotation atherectomy right and left common iliac arteries, Bilateral covered stenting (kissing stent technique) bilateral iliac arteries.  Clinical Impression   Patient evaluated by Physical Therapy with no further acute PT needs identified, as she will be going home today; I agree that home will likely be the most therapeutic place for Crystal Osborne; Would like HHPT follow up for gently increasing strength and endurance to work towards her stated goal of getting back to walking in her neighborhood; would also benefit from HHPT to take a look at her bathroom setup; Worth looking into Outpt PT after HHPT course -- this can be setup by PCP. All education has been completed and the patient has no further questions.  See below for any follow-up Physical Therapy or equipment needs. PT is signing off. Thank you for this referral.       Follow Up Recommendations Home health PT;Supervision - Intermittent    Equipment Recommendations  Rolling walker with 5" wheels    Recommendations for Other Services       Precautions / Restrictions Precautions Precautions: Fall Precaution Comments: Fall risk greatly reduced with use of RW      Mobility  Bed Mobility Overal bed mobility: Modified Independent             General bed mobility comments: slow moving and using rails  Transfers Overall transfer level: Needs  assistance Equipment used: Rolling walker (2 wheeled) Transfers: Sit to/from Stand Sit to Stand: Supervision         General transfer comment: Cues for hand placement and safety  Ambulation/Gait Ambulation/Gait assistance: Min guard;Supervision Ambulation Distance (Feet): 120 Feet (with one seated rest break) Assistive device: Rolling walker (2 wheeled) Gait Pattern/deviations: Step-through pattern   Gait velocity interpretation: Below normal speed for age/gender General Gait Details: Cues to self-monitor for activity tolerance  Stairs Stairs:  (Pt and son are confident in their ability to manage the one step to enter her home)          Wheelchair Mobility    Modified Rankin (Stroke Patients Only)       Balance                                             Pertinent Vitals/Pain Pain Assessment: No/denies pain    Home Living Family/patient expects to be discharged to:: Private residence Living Arrangements: Alone Available Help at Discharge: Family;Available PRN/intermittently (first week home, son will stay with pt) Type of Home: House Home Access: Stairs to enter   [employer] of Steps: 1 Home Layout: Multi-level;Able to live on main level with bedroom/bathroom Home Equipment: Hand held shower head;Shower seat      Prior Function Level of Independence: Independent         Comments: Very limited in amb distance recently due to claudication; used to enjoy walking her dog     Hand Dominance        Extremity/Trunk  Assessment   Upper Extremity Assessment: Generalized weakness (has fx'd both wrists in the past, weak since)           Lower Extremity Assessment: Generalized weakness         Communication   Communication: No difficulties  Cognition Arousal/Alertness: Awake/alert Behavior During Therapy: WFL for tasks assessed/performed Overall Cognitive Status: Within Functional Limits for tasks assessed                       General Comments General comments (skin integrity, edema, etc.): VSS on Room Air    Exercises     Assessment/Plan    PT Assessment All further PT needs can be met in the next venue of care  PT Problem List Decreased strength;Decreased activity tolerance;Decreased balance;Decreased mobility;Decreased knowledge of use of DME;Decreased knowledge of precautions          PT Treatment Interventions DME instruction;Gait training;Stair training;Functional mobility training;Therapeutic activities;Balance training;Therapeutic exercise;Patient/family education    PT Goals (Current goals can be found in the Care Plan section)  Acute Rehab PT Goals Patient Stated Goal: Would like to get back to walking outside PT Goal Formulation: All assessment and education complete, DC therapy    Frequency     Barriers to discharge        Co-evaluation               End of Session Equipment Utilized During Treatment: Gait belt Activity Tolerance: Patient tolerated treatment well Patient left: in bed;with call bell/phone within reach Nurse Communication: Mobility status    Functional Assessment Tool Used: Clinical Judgement Functional Limitation: Mobility: Walking and moving around Mobility: Walking and Moving Around Current Status (G8978): At least 1 percent but less than 20 percent impaired, limited or restricted Mobility: Walking and Moving Around Goal Status (G8979): 0 percent impaired, limited or restricted    Time: 1035-1055 PT Time Calculation (min) (ACUTE ONLY): 20 min   Charges:   PT Evaluation $PT Eval Low Complexity: 1 Procedure     PT G Codes:   PT G-Codes **NOT FOR INPATIENT CLASS** Functional Assessment Tool Used: Clinical Judgement Functional Limitation: Mobility: Walking and moving around Mobility: Walking and Moving Around Current Status (G8978): At least 1 percent but less than 20 percent impaired, limited or restricted Mobility: Walking and  Moving Around Goal Status (G8979): 0 percent impaired, limited or restricted    ,  Crystal Osborne 12/04/2015, 11:12 AM   , PT  Acute Rehabilitation Services Pager [phone] Office [phone]   

## 2015-12-04 NOTE — Care Management Note (Signed)
Case Management Note  Patient Details  Name: Crystal Osborne MRN: [id number] Date of Birth: 1931-02-23  Subjective/Objective:     S/p pv intervention will be on plavix, Patient will need HHPT and rolling walker per RN,  NCM spoke patient and son Dominica Severin they chose [facility] for [facility] and son Dominica Severin states [facility] can bring the rolling walker up .  Referral made to [facility] with [facility] and [place] with  [facility].  Patient is for dc today.  Will need HHPT orders.               Action/Plan:   Expected Discharge Date:                  Expected Discharge Plan:  [place]  In-House Referral:     Discharge planning Services  CM Consult  Post Acute Care Choice:  Durable Medical Equipment, Home Health Choice offered to:  Patient, Adult Children  DME Arranged:  Walker rolling DME Agency:  [place]:  PT [place]:  [facility] ([facility])  Status of Service:  Completed, signed off  If discussed at [employer] of Stay Meetings, dates discussed:    Additional Comments:  Zenon Mayo, RN 12/04/2015, 9:37 AM

## 2015-12-04 NOTE — Progress Notes (Addendum)
Reviewed Labs and BP data with Dr Martinique.  Pt SBP dropped to 80s this pm and H&H decreased to 7.9/24.1  BP improved w/ IVF but was trending down again into the 90s.  R groin w/ hematoma larger than this am, but not tender. Initial size this am 2x2 cm, now extends up a short distance into her abdomen, 3x7 cm.   CT abdomen ordered, pending.   Plan: cancel d/c Serial H&Hs F/u on CT results May need transfusion, should get if Hgb < 7.5 Support BP with IVF Further plans depending on CT results and successive blood counts.  Spoke with patient and her son in the room. They understand.  Crystal Osborne, Crystal Osborne 12/04/2015 4:40 PM Beeper [phone]

## 2015-12-05 ENCOUNTER — Other Ambulatory Visit: Payer: Self-pay | Admitting: Cardiology

## 2015-12-05 DIAGNOSIS — I959 Hypotension, unspecified: Secondary | ICD-10-CM | POA: Diagnosis not present

## 2015-12-05 DIAGNOSIS — I739 Peripheral vascular disease, unspecified: Secondary | ICD-10-CM

## 2015-12-05 DIAGNOSIS — Z7982 Long term (current) use of aspirin: Secondary | ICD-10-CM | POA: Diagnosis not present

## 2015-12-05 DIAGNOSIS — E785 Hyperlipidemia, unspecified: Secondary | ICD-10-CM | POA: Diagnosis not present

## 2015-12-05 DIAGNOSIS — D62 Acute posthemorrhagic anemia: Secondary | ICD-10-CM | POA: Diagnosis not present

## 2015-12-05 DIAGNOSIS — Z95820 Peripheral vascular angioplasty status with implants and grafts: Secondary | ICD-10-CM | POA: Diagnosis not present

## 2015-12-05 DIAGNOSIS — I70291 Other atherosclerosis of native arteries of extremities, right leg: Secondary | ICD-10-CM | POA: Diagnosis not present

## 2015-12-05 DIAGNOSIS — I2510 Atherosclerotic heart disease of native coronary artery without angina pectoris: Secondary | ICD-10-CM | POA: Diagnosis not present

## 2015-12-05 DIAGNOSIS — Z8673 Personal history of transient ischemic attack (TIA), and cerebral infarction without residual deficits: Secondary | ICD-10-CM | POA: Diagnosis not present

## 2015-12-05 DIAGNOSIS — I70212 Atherosclerosis of native arteries of extremities with intermittent claudication, left leg: Secondary | ICD-10-CM | POA: Diagnosis not present

## 2015-12-05 DIAGNOSIS — R001 Bradycardia, unspecified: Secondary | ICD-10-CM | POA: Diagnosis not present

## 2015-12-05 DIAGNOSIS — I6523 Occlusion and stenosis of bilateral carotid arteries: Secondary | ICD-10-CM | POA: Diagnosis not present

## 2015-12-05 DIAGNOSIS — Z8249 Family history of ischemic heart disease and other diseases of the circulatory system: Secondary | ICD-10-CM | POA: Diagnosis not present

## 2015-12-05 DIAGNOSIS — Z7902 Long term (current) use of antithrombotics/antiplatelets: Secondary | ICD-10-CM | POA: Diagnosis not present

## 2015-12-05 DIAGNOSIS — Z79899 Other long term (current) drug therapy: Secondary | ICD-10-CM | POA: Diagnosis not present

## 2015-12-05 LAB — HEMOGLOBIN AND HEMATOCRIT, BLOOD
HEMATOCRIT: 23.6 % — AB (ref 36.0–46.0)
HEMOGLOBIN: 7.8 g/dL — AB (ref 12.0–15.0)

## 2015-12-05 MED ORDER — FERROUS SULFATE 325 (65 FE) MG PO TABS: 325.0000 mg | ORAL_TABLET | Freq: Every day | ORAL | 3 refills | Status: DC

## 2015-12-05 NOTE — Discharge Summary (Signed)
Discharge Summary    Patient ID: Crystal Osborne,  MRN: [id number], DOB/AGE: October 21, 1931 82 y.o.  Admit date: 12/03/2015 Discharge date: 12/05/2015  Primary Care Provider: Kathlene November Primary Cardiologist: Dr. Gwenlyn Found  Discharge Diagnoses    Active Problems:   Claudication [facility])   Allergies Allergies  Allergen Reactions  . Ciprofloxacin Hcl Itching  . Septra [Sulfamethoxazole-Trimethoprim] Itching  . Codeine Rash    Made her feel very strange    Diagnostic Studies/Procedures    Peripheral Vascular Cath and PTA Angiographic Data:  1: Abdominal aorta-fluoroscopically calcified 2: Left lower extremity-95% calcified ostial left common iliac artery stenosis. There was three-vessel runoff 3: Right lower extremity-90% calcified ostial right common iliac artery stenosis with three-vessel runoff IMPRESSION:[name] has 95% calcified bilateral ostial common iliac artery stenosis with lifestyle limiting claudication. Will proceed with diamondback orbital rotational atherectomy of both iliac artery ostia followed by PTA and covered stenting. Procedure Description: The patient received 9000 units heparin intravenously with an ACT of 235. Total contrast administered the patient was 205 mL. Both right and left common femoral arteries were accessed with 7 French bright tip sheath. I was able to get across the right common iliac artery ostia with a bursa core wire and 5 [place] end hole catheter. I exchanged for a 014 Viper wire. I then performed orbital atherectomy with a 2 mm burr up to 120,000 rpm's. Following this I performed a similar procedure on the left common iliac artery I then predilated both iliac ostia with 4 mm x 4 cm balloons and the right ostia I upgraded to a 6 mm x 2 cm balloon. Because of inability to pass a stent on the right side I exchanged my Versicore wire for an Amplatz wire. Following this I placed a 7 mm x 58 mm long Lifestream covered stent in the origin of both iliac  arteries carefully positioning the stent angiographically and fluoroscopically. I simultaneously deployed this using "kissing stent technique" at 8-10 atm each resulting reduction of a 95% calcified ostial bilateral iliac artery stenoses to 0% residual. The patient tolerated the procedure well. Both bright tip sheath were exchanged over wires for short 7 French sheath which were then secured in place. The patient received 300 mg of by mouth Plavix at the end of the case. Final Impression:Successful bilateral high-grade calcified ostial iliac stenosis diamondback orbital rotational atherectomy, kissing covered stents with excellent antegrade result. The patient tolerated the procedure well. The sheaths will be removed and pressure held once the ACT falls below 170. She'll be hydrated overnight and discharged home in the morning on dual antiplatelet therapy. We will get lower extremity arterial Doppler studies in our [facility] office next week and I will see her back several weeks thereafter. She left the lab in stable condition. _ ____________ History of Present Illness     Ms. Crystal Osborne is a 82 year old female with a past medical history of tobacco abuse, HTN, HLD, and TIA. She was referred to Dr. Gwenlyn Found by her PCP for lifestyle limiting claudication. Dr. Gwenlyn Found saw her in the office on 11/09/15 and her ABI's were consistent with PAD and she had high frequency signals in the origins of both iliac arteries making her a good candidate for angiography and potential intervention. She was scheduled for lower extremity angiography on 12/03/15.   Hospital Course     PV report above. She had 95% calcified bilateral ostial common iliac artery stenosis, and underwent successful bilateral rotational atherectomy and stenting. She  developed right and left groin hematomas and had a drop in her hemoglobin so she was observed overnight and sent for abdominal CT to rule out retroperitoneal bleed. CT showed small hemorrhage, and  the patient remained stable.   She had some bradycardia noted on telemetry so her metoprolol was discontinued. We will resume her lisinopril at home dose for hypertension, also will be on ASA and Plavix post stenting. Will hold Plendil.   We will get follow up dopplers in one week and she will see Dr. Gwenlyn Found 2-3 weeks thereafter. Will also need CBC and BMP in our office next week.    _____________  Discharge Vitals Blood pressure (!) 118/40, pulse 91, temperature 98.2 F (36.8 C), temperature source Oral, resp. rate (!) 23, height _0  (1.651 m), weight 114 lb 10.2 oz (52 kg), SpO2 95 %.  Filed Weights   12/03/15 1031 12/04/15 0311 12/05/15 0400  Weight: 115 lb (52.2 kg) 113 lb 15.7 oz (51.7 kg) 114 lb 10.2 oz (52 kg)    Labs & Radiologic Studies     CBC  Recent Labs  12/04/15 0325  12/04/15 2004 12/05/15 0208  WBC 12.9*  --   --   --   HGB 9.6*  < > 8.4* 7.8*  HCT 29.4*  < > 25.7* 23.6*  MCV 94.2  --   --   --   PLT 193  --   --   --   < > = values in this interval not displayed. Basic Metabolic Panel  Recent Labs  12/04/15 0325  NA 138  K 3.9  CL 111  CO2 21*  GLUCOSE 118*  BUN 19  CREATININE 0.93  CALCIUM 8.0*    Ct Abdomen Pelvis Wo Contrast  Result Date: 12/04/2015 CLINICAL DATA:  Decreased hematocrit. Status post cardiac catheterization. Evaluate for retroperitoneal hemorrhage. EXAM: CT ABDOMEN AND PELVIS WITHOUT CONTRAST TECHNIQUE: Multidetector CT imaging of the abdomen and pelvis was performed following the standard protocol without IV contrast. COMPARISON:  None. FINDINGS: Lower chest: No acute findings. Hepatobiliary: No mass visualized on this unenhanced exam. Gallbladder is distended and contains a calcified gallstone as well as high attenuation sludge. No evidence of gallbladder wall thickening or pericholecystic fluid. No evidence of biliary ductal dilatation. Pancreas: No mass or inflammatory process visualized on this unenhanced exam. Spleen:  Within  normal limits in size. Adrenals/Urinary tract: No evidence of urolithiasis or hydronephrosis. Contrast seen within urinary bladder from recent cardiac catheterization. Gas is also seen within urinary bladder, likely due to recent catheterization. No focal bladder wall thickening or mass identified. Stomach/Bowel: No evidence of obstruction, inflammatory process, or abnormal fluid collections. Normal appendix visualized. Colonic diverticulosis is seen with most severe involvement of the descending sigmoid colon. No evidence of diverticulitis. Vascular/Lymphatic: No pathologically enlarged lymph nodes identified. Aortic atherosclerosis noted with 3.2 cm infrarenal abdominal aortic aneurysm. Reproductive: Prior hysterectomy noted. Adnexal regions are unremarkable in appearance. Other: Mild hemorrhage seen in the left groin as well as extending within the pelvis along the left external iliac vessels and pelvic side wall. Musculoskeletal:  No suspicious bone lesions identified. IMPRESSION: Mild soft tissue hemorrhage in the left groin, and small amount of hemorrhage in the left pelvis along the left external iliac vessels and left pelvic sidewall. 3.2 cm infrarenal abdominal aortic aneurysm. Recommend followup by ultrasound in 3 years. This recommendation follows ACR consensus guidelines: White Paper of the ACR Incidental Findings Committee II on Vascular Findings. J Am Coll Radiol 2013; 10:789-794. Cholelithiasis.  No radiographic evidence of cholecystitis. Colonic diverticulosis. No radiographic evidence of diverticulitis. Electronically Signed   By: Earle Gell M.D.   On: 12/04/2015 17:34   Dg Chest 2 View  Result Date: 11/09/2015 CLINICAL DATA:  Claudication, pre-procedure. History of aortic aneurysm, COPD, hypertension, right mastectomy and smoking. EXAM: CHEST  2 VIEW COMPARISON:  01/20/2014 FINDINGS: The lungs are hyperinflated with hyperlucent appearance of the right lung base secondary to prior right  mastectomy. Pectus carinatum deformity of the anterior chest wall. No pneumonic consolidation, effusion or pneumothorax. The heart is top-normal in size. The thoracic aorta is atherosclerotic at its arch. No acute osseous abnormality IMPRESSION: COPD without acute pulmonary disease. Pectus carinatum deformity of the anterior chest wall. Right mastectomy. Electronically Signed   By: Ashley Royalty M.D.   On: 11/09/2015 12:54   US Carotid Duplex Bilateral  Result Date: 11/17/2015 CLINICAL DATA:  Bilateral carotid bruits. History of hypertension, hypercholesterolemia and active smoking. Prior duplex ultrasound demonstrated velocity elevation with estimated bilateral 40- 59% ICA stenoses. EXAM: BILATERAL CAROTID DUPLEX ULTRASOUND TECHNIQUE: Pearline Cables scale imaging, color Doppler and duplex ultrasound were performed of bilateral carotid and vertebral arteries in the neck. COMPARISON:  Report from a prior carotid duplex ultrasound at [facility] dated 09/25/2011 FINDINGS: Criteria: Quantification of carotid stenosis is based on velocity parameters that correlate the residual internal carotid diameter with NASCET-based stenosis levels, using the diameter of the distal internal carotid lumen as the denominator for stenosis measurement. The following velocity measurements were obtained: RIGHT ICA:  190/27 cm/sec CCA:  517/00 cm/sec SYSTOLIC ICA/CCA RATIO:  2.6 DIASTOLIC ICA/CCA RATIO:  2.2 ECA:  254 cm/sec LEFT ICA:  231/42 cm/sec CCA:  174/94 cm/sec SYSTOLIC ICA/CCA RATIO:  2.1 DIASTOLIC ICA/CCA RATIO:  1.9 ECA:  320 cm/sec RIGHT CAROTID ARTERY: A moderate amount of predominately calcified plaque is present at the level of the distal common carotid artery, carotid bulb and extending into both proximal internal and external carotid arteries. Significant external carotid stenosis present. Proximal and mid right ICA demonstrate turbulent flow and velocity elevation corresponding to estimated 50- 69% stenosis. The  ICA in the upper neck is moderately tortuous. RIGHT VERTEBRAL ARTERY: Antegrade flow with normal waveform and velocity. LEFT CAROTID ARTERY: There is a moderate amount of calcified plaque at the level of the distal common carotid artery and carotid bulb. Significant plaque is identified in the proximal internal and external carotid arteries. Proximal left ECA stenosis is likely severe. Maximal proximal ICA velocity is 231 cm/second with turbulent flow present. Previously the outside duplex also demonstrated maximal systolic velocity of 496 cm/second. This corresponds to an estimated greater than 70% left ICA stenosis. The rest of the internal carotid artery is moderately tortuous. LEFT VERTEBRAL ARTERY: Antegrade flow with normal waveform and velocity. IMPRESSION: Significant atherosclerotic plaque at both carotid bifurcations. Estimated right ICA stenosis is 50- 69%. Estimated left ICA stenosis is greater than 70%. Maximal velocities in the proximal left ICA are stable since 2013. Both proximal external carotid arteries show velocity elevation consistent with significant stenoses. Electronically Signed   By: Aletta Edouard M.D.   On: 11/17/2015 14:53   US Aorta  Result Date: 11/17/2015 CLINICAL DATA:  82 year old female with a history of infrarenal abdominal aortic aneurysm. Multiple cardiovascular risk factors. EXAM: ULTRASOUND OF ABDOMINAL AORTA TECHNIQUE: Ultrasound examination of the abdominal aorta was performed to evaluate for abdominal aortic aneurysm. COMPARISON:  Ultrasound 05/31/2015, plain film 02/17/2004 FINDINGS: Abdominal Aorta Atherosclerotic changes of the abdominal aorta. Greatest diameter  proximal:  3.4 cm. Greatest diameter mid:  3.3 cm Distal:  2.0 cm Right common iliac:  1.4 cm Left common iliac: 0.8 cm Thrombus/plaque within the aneurysm sac of the mid aorta with absence of flow. IMPRESSION: Re- demonstration of irregular fusiform aneurysm of the abdominal aorta, with the greatest  diameter measuring 3.4 cm, slightly enlarged in diameter from the prior measurement on May 31, 2015 of 3.2 cm. Thrombus/plaque evident within the saccular component in the mid abdomen. Recommend followup by ultrasound in 3 years. This recommendation follows ACR consensus guidelines: White Paper of the ACR Incidental Findings Committee II on Vascular Findings. J Am Coll Radiol 2013; 10:789-794 Relationship of the aneurysm sac with the mesenteric arteries and the renal arteries not well evaluated by ultrasound. CT angiogram may be considered for anatomic evaluation. Right common iliac aneurysm measuring 1.4 cm. Signed, Dulcy Fanny. Earleen Newport, DO [facility] Radiology Electronically Signed   By: Corrie Mckusick D.O.   On: 11/17/2015 15:02    Disposition   Pt is being discharged home today in good condition.  Follow-up Plans & Appointments    Follow-up Information    [facility] Follow up on 12/11/2015.   Specialty:  Cardiology Why:  Ultrasound study (Dopplers) of your legs at 12:30 pm. Please arrive 15 minutes early for paperwork Contact information: [place] [phone]       Rosaria Ferries, PA-C Follow up on 12/24/2015.   Specialties:  Cardiology, Radiology Why:  See provider at 10:00 am, please arrive 15 minutes early for paperwork. Contact information: [address] [place] [address] [phone]        [facility] .   Why:  rolling walker Contact information: [place] [address] [phone]        [facility] .   Why:  HHPT Contact information: [place] [address] [place] [address] [phone]          Discharge Instructions    Diet - low sodium heart healthy    Complete by:  As directed    Increase activity slowly    Complete by:  As directed       Discharge Medications     Current Discharge Medication List    START taking these medications   Details  clopidogrel (PLAVIX) 75 MG tablet Take 1 tablet (75 mg total) by mouth daily with breakfast. Qty: 30 tablet, Refills: 11    ferrous sulfate 325 (65 FE) MG tablet Take 1 tablet (325 mg total) by mouth daily with breakfast. Refills: 3      CONTINUE these medications which have NOT CHANGED   Details  acetaminophen (TYLENOL) 500 MG tablet Take 500 mg by mouth 2 (two) times daily as needed (for pain.).    AMBULATORY NON FORMULARY MEDICATION Right Breast Prosthesis  Dx: V15.29 Qty: 1 Device, Refills: 0    aspirin EC 81 MG tablet Take 81 mg by mouth every evening.    atorvastatin (LIPITOR) 40 MG tablet Take 1 tablet (40 mg total) by mouth daily. Qty: 90 tablet, Refills: 1    benazepril (LOTENSIN) 20 MG tablet Take 1 tablet (20 mg total) by mouth 2 (two) times daily. Qty: 180 tablet, Refills: 1    vitamin B-12 (CYANOCOBALAMIN) 500 MCG tablet Take 500 mcg by mouth every evening.    triamcinolone cream (KENALOG) 0.1 % Apply 1 application topically 2 (two)  times daily as needed (for itching.).      STOP taking these medications     felodipine (PLENDIL) 10 MG 24 hr tablet      metoprolol (LOPRESSOR) 50 MG tablet      albuterol (VENTOLIN HFA) 108 (90 Base) MCG/ACT inhaler      azithromycin (ZITHROMAX Z-PAK) 250 MG tablet            Outstanding Labs/Studies    Duration of Discharge Encounter   Greater than 30 minutes including physician time.  Signed, Arbutus Leas NP 12/05/2015, 9:43 AM

## 2015-12-05 NOTE — Progress Notes (Signed)
Patient Name: Crystal Osborne Date of Encounter: 12/05/2015  Primary Cardiologist: Dr. Serena Croissant Problem List     Active Problems:   Claudication [facility])     Subjective   Feels ok, weak and tired. No chest pain or SOB. No back pain or abdominal pain   Inpatient Medications    Scheduled Meds: . aspirin EC  81 mg Oral Daily  . clopidogrel  75 mg Oral Q breakfast   Continuous Infusions:   PRN Meds: acetaminophen, hydrALAZINE, morphine injection, ondansetron (ZOFRAN) IV   Vital Signs    Vitals:   12/04/15 1400 12/04/15 1955 12/04/15 2000 12/05/15 0400  BP: (!) 102/29 (!) 104/28  (!) 127/30  Pulse: 71 75  83  Resp: (!) 24 (!) 22 (!) 25 17  Temp: 97.8 F (36.6 C) 98.7 F (37.1 C)  98.4 F (36.9 C)  TempSrc: Oral Oral  Oral  SpO2: 97% 94%  95%  Weight:    114 lb 10.2 oz (52 kg)  Height:        Intake/Output Summary (Last 24 hours) at 12/05/15 0749 Last data filed at 12/05/15 0403  Gross per 24 hour  Intake              240 ml  Output             1450 ml  Net            -1210 ml   Filed Weights   12/03/15 1031 12/04/15 0311 12/05/15 0400  Weight: 115 lb (52.2 kg) 113 lb 15.7 oz (51.7 kg) 114 lb 10.2 oz (52 kg)    Physical Exam   GEN: Well nourished, well developed, in no acute distress.  HEENT: Grossly normal.  Neck: Supple, no JVD, carotid bruits, or masses. Cardiac: RRR, no murmurs, rubs, or gallops. No clubbing, cyanosis, edema.  Radials/DP/PT 2+ and equal bilaterally. Level one hematoma on left groin. Also with level one hematoma at right groin site.  Respiratory:  Respirations regular and unlabored, clear to auscultation bilaterally. GI: Soft, nontender, nondistended, BS + x 4. MS: no deformity or atrophy. Skin: warm and dry, no rash. Neuro:  Strength and sensation are intact. Psych: AAOx3.  Normal affect.  Labs    CBC  Recent Labs  12/04/15 0325  12/04/15 2004 12/05/15 0208  WBC 12.9*  --   --   --   HGB 9.6*  < > 8.4* 7.8*  HCT  29.4*  < > 25.7* 23.6*  MCV 94.2  --   --   --   PLT 193  --   --   --   < > = values in this interval not displayed. Basic Metabolic Panel  Recent Labs  12/04/15 0325  NA 138  K 3.9  CL 111  CO2 21*  GLUCOSE 118*  BUN 19  CREATININE 0.93  CALCIUM 8.0*     Telemetry     NSR- Personally Reviewed  ECG    NSR, ST depression and T wave inversion in anterolateral leads.  - Personally Reviewed  Radiology    Ct Abdomen Pelvis Wo Contrast  Result Date: 12/04/2015 CLINICAL DATA:  Decreased hematocrit. Status post cardiac catheterization. Evaluate for retroperitoneal hemorrhage. EXAM: CT ABDOMEN AND PELVIS WITHOUT CONTRAST TECHNIQUE: Multidetector CT imaging of the abdomen and pelvis was performed following the standard protocol without IV contrast. COMPARISON:  None. FINDINGS: Lower chest: No acute findings. Hepatobiliary: No mass visualized on this unenhanced exam. Gallbladder is distended and contains  a calcified gallstone as well as high attenuation sludge. No evidence of gallbladder wall thickening or pericholecystic fluid. No evidence of biliary ductal dilatation. Pancreas: No mass or inflammatory process visualized on this unenhanced exam. Spleen:  Within normal limits in size. Adrenals/Urinary tract: No evidence of urolithiasis or hydronephrosis. Contrast seen within urinary bladder from recent cardiac catheterization. Gas is also seen within urinary bladder, likely due to recent catheterization. No focal bladder wall thickening or mass identified. Stomach/Bowel: No evidence of obstruction, inflammatory process, or abnormal fluid collections. Normal appendix visualized. Colonic diverticulosis is seen with most severe involvement of the descending sigmoid colon. No evidence of diverticulitis. Vascular/Lymphatic: No pathologically enlarged lymph nodes identified. Aortic atherosclerosis noted with 3.2 cm infrarenal abdominal aortic aneurysm. Reproductive: Prior hysterectomy noted. Adnexal  regions are unremarkable in appearance. Other: Mild hemorrhage seen in the left groin as well as extending within the pelvis along the left external iliac vessels and pelvic side wall. Musculoskeletal:  No suspicious bone lesions identified. IMPRESSION: Mild soft tissue hemorrhage in the left groin, and small amount of hemorrhage in the left pelvis along the left external iliac vessels and left pelvic sidewall. 3.2 cm infrarenal abdominal aortic aneurysm. Recommend followup by ultrasound in 3 years. This recommendation follows ACR consensus guidelines: White Paper of the ACR Incidental Findings Committee II on Vascular Findings. J Am Coll Radiol 2013; 10:789-794. Cholelithiasis.  No radiographic evidence of cholecystitis. Colonic diverticulosis. No radiographic evidence of diverticulitis. Electronically Signed   By: Crystal Osborne M.D.   On: 12/04/2015 17:34    Cardiac Studies   Abdominal Aortogram  Lower Extremity Angiography  Peripheral Vascular Atherectomy  Peripheral Vascular Intervention 12/03/15   IMPRESSION: Crystal Osborne has 95% calcified bilateral ostial common iliac artery stenosis with lifestyle limiting claudication. Will proceed with diamondback orbital rotational atherectomy of both iliac artery ostia followed by PTA and covered stenting.  Patient Profile     82 yo female w/ hx tob use, HTN, HLD, FH CAD, TIA. Referred to Dr Gwenlyn Found for claudication symptoms. PV cath scheduled and she came in for this on 10/23.  Assessment & Plan    1. PAD s/p PTCA and stenting to R and L common iliac arteries: Will be on DAPT with ASA and Plavix. She developed a right groin hematoma and Hgb dropped to 7.9 from 9.6. She was hydrated and CT abdomen was ordered and showed a small hemorrhage. Hgb is stable this am with Hgb of 7.8.   2. HTN: hypotensive at times, beta blocker and ACE-I on hold.   3. Bradycardia: was on 50mg  Metoprolol BID at home. This is on hold, bradycardia resolved.      Signed, Arbutus Leas, NP  12/05/2015, 7:49 AM  Patient seen and examined and history reviewed. Agree with above findings and plan. Patient is feeling better today. Developed some hemorrhage yesterday in left groin and pelvis with hypotension and drop in Hgb. Hgb has since stabilized. Now 7.8. Groin appears stable. BP improved. Lungs are clear.  She appears stable for DC today. Will resume lisinopril 20 mg daily. Continue to hold Plendil and metoprolol for now. Continue ASA and Plavix. Start Fe 325 mg daily. Will need follow up in office with repeat labs within one week.  Peter Martinique, [place] 12/05/2015 9:04 AM

## 2015-12-06 DIAGNOSIS — Z8673 Personal history of transient ischemic attack (TIA), and cerebral infarction without residual deficits: Secondary | ICD-10-CM | POA: Diagnosis not present

## 2015-12-06 DIAGNOSIS — I70213 Atherosclerosis of native arteries of extremities with intermittent claudication, bilateral legs: Secondary | ICD-10-CM | POA: Diagnosis not present

## 2015-12-06 DIAGNOSIS — E119 Type 2 diabetes mellitus without complications: Secondary | ICD-10-CM | POA: Diagnosis not present

## 2015-12-06 DIAGNOSIS — Z87440 Personal history of urinary (tract) infections: Secondary | ICD-10-CM | POA: Diagnosis not present

## 2015-12-06 DIAGNOSIS — I2510 Atherosclerotic heart disease of native coronary artery without angina pectoris: Secondary | ICD-10-CM | POA: Diagnosis not present

## 2015-12-06 DIAGNOSIS — J449 Chronic obstructive pulmonary disease, unspecified: Secondary | ICD-10-CM | POA: Diagnosis not present

## 2015-12-06 DIAGNOSIS — E890 Postprocedural hypothyroidism: Secondary | ICD-10-CM | POA: Diagnosis not present

## 2015-12-06 DIAGNOSIS — I10 Essential (primary) hypertension: Secondary | ICD-10-CM | POA: Diagnosis not present

## 2015-12-06 DIAGNOSIS — E785 Hyperlipidemia, unspecified: Secondary | ICD-10-CM | POA: Diagnosis not present

## 2015-12-06 DIAGNOSIS — Z48812 Encounter for surgical aftercare following surgery on the circulatory system: Secondary | ICD-10-CM | POA: Diagnosis not present

## 2015-12-06 DIAGNOSIS — Z95820 Peripheral vascular angioplasty status with implants and grafts: Secondary | ICD-10-CM | POA: Diagnosis not present

## 2015-12-07 ENCOUNTER — Telehealth: Payer: Self-pay | Admitting: Internal Medicine

## 2015-12-07 ENCOUNTER — Telehealth: Payer: Self-pay | Admitting: Cardiovascular Disease

## 2015-12-07 NOTE — Telephone Encounter (Signed)
Called and gave verbal ok for home health, as it was ordered at discharge from the hospital after the pv angiogram. Arville Go will send orders for Dr Gwenlyn Found to sign.

## 2015-12-07 NOTE — Telephone Encounter (Signed)
New message  Therapist, [facility] requesting approval for frequency of therapy for pt  Frequency: 1X for the first week, 3X a week for 2 weeks, then 2X a week for 2 weeks  Call at [phone]

## 2015-12-07 NOTE — Telephone Encounter (Signed)
Caller name: Di Kindle Relationship to patient: [facility] Can be reached: [phone]   Reason for call: [facility] needs orders for Physical Therapy to include 1 x for present week 3x/week for 2 weeks 2x/week for 2 weeks

## 2015-12-07 NOTE — Telephone Encounter (Signed)
Conetoe that I didn't see in chart where PCP placed orders for PT but that Pt's cardiologist Dr. Gwenlyn Found may have. Number to cardiologist office given. Instructed to call if questions or concerns.

## 2015-12-10 DIAGNOSIS — Z8673 Personal history of transient ischemic attack (TIA), and cerebral infarction without residual deficits: Secondary | ICD-10-CM | POA: Diagnosis not present

## 2015-12-10 DIAGNOSIS — Z95820 Peripheral vascular angioplasty status with implants and grafts: Secondary | ICD-10-CM | POA: Diagnosis not present

## 2015-12-10 DIAGNOSIS — Z48812 Encounter for surgical aftercare following surgery on the circulatory system: Secondary | ICD-10-CM | POA: Diagnosis not present

## 2015-12-10 DIAGNOSIS — I70213 Atherosclerosis of native arteries of extremities with intermittent claudication, bilateral legs: Secondary | ICD-10-CM | POA: Diagnosis not present

## 2015-12-10 DIAGNOSIS — J449 Chronic obstructive pulmonary disease, unspecified: Secondary | ICD-10-CM | POA: Diagnosis not present

## 2015-12-10 DIAGNOSIS — E890 Postprocedural hypothyroidism: Secondary | ICD-10-CM | POA: Diagnosis not present

## 2015-12-10 DIAGNOSIS — I2510 Atherosclerotic heart disease of native coronary artery without angina pectoris: Secondary | ICD-10-CM | POA: Diagnosis not present

## 2015-12-10 DIAGNOSIS — Z87440 Personal history of urinary (tract) infections: Secondary | ICD-10-CM | POA: Diagnosis not present

## 2015-12-10 DIAGNOSIS — I10 Essential (primary) hypertension: Secondary | ICD-10-CM | POA: Diagnosis not present

## 2015-12-10 DIAGNOSIS — E785 Hyperlipidemia, unspecified: Secondary | ICD-10-CM | POA: Diagnosis not present

## 2015-12-10 DIAGNOSIS — E119 Type 2 diabetes mellitus without complications: Secondary | ICD-10-CM | POA: Diagnosis not present

## 2015-12-11 ENCOUNTER — Ambulatory Visit (HOSPITAL_COMMUNITY)
Admission: RE | Admit: 2015-12-11 | Discharge: 2015-12-11 | Disposition: A | Discharge status: Home / Self Care | Payer: Medicare Other | Source: Ambulatory Visit | Attending: Cardiovascular Disease | Admitting: Cardiovascular Disease

## 2015-12-11 ENCOUNTER — Other Ambulatory Visit: Payer: Self-pay | Admitting: Cardiovascular Disease

## 2015-12-11 ENCOUNTER — Inpatient Hospital Stay (HOSPITAL_COMMUNITY): Admit: 2015-12-11 | Payer: Medicare Other

## 2015-12-11 ENCOUNTER — Other Ambulatory Visit (HOSPITAL_COMMUNITY): Payer: Medicare Other

## 2015-12-11 ENCOUNTER — Encounter (HOSPITAL_COMMUNITY): Payer: Self-pay

## 2015-12-11 DIAGNOSIS — I739 Peripheral vascular disease, unspecified: Secondary | ICD-10-CM

## 2015-12-11 DIAGNOSIS — I714 Abdominal aortic aneurysm, without rupture, unspecified: Secondary | ICD-10-CM

## 2015-12-11 DIAGNOSIS — I779 Disorder of arteries and arterioles, unspecified: Secondary | ICD-10-CM

## 2015-12-12 DIAGNOSIS — I2510 Atherosclerotic heart disease of native coronary artery without angina pectoris: Secondary | ICD-10-CM | POA: Diagnosis not present

## 2015-12-12 DIAGNOSIS — Z87440 Personal history of urinary (tract) infections: Secondary | ICD-10-CM | POA: Diagnosis not present

## 2015-12-12 DIAGNOSIS — E785 Hyperlipidemia, unspecified: Secondary | ICD-10-CM | POA: Diagnosis not present

## 2015-12-12 DIAGNOSIS — E890 Postprocedural hypothyroidism: Secondary | ICD-10-CM | POA: Diagnosis not present

## 2015-12-12 DIAGNOSIS — I10 Essential (primary) hypertension: Secondary | ICD-10-CM | POA: Diagnosis not present

## 2015-12-12 DIAGNOSIS — E119 Type 2 diabetes mellitus without complications: Secondary | ICD-10-CM | POA: Diagnosis not present

## 2015-12-12 DIAGNOSIS — Z48812 Encounter for surgical aftercare following surgery on the circulatory system: Secondary | ICD-10-CM | POA: Diagnosis not present

## 2015-12-12 DIAGNOSIS — I70213 Atherosclerosis of native arteries of extremities with intermittent claudication, bilateral legs: Secondary | ICD-10-CM | POA: Diagnosis not present

## 2015-12-12 DIAGNOSIS — Z95820 Peripheral vascular angioplasty status with implants and grafts: Secondary | ICD-10-CM | POA: Diagnosis not present

## 2015-12-12 DIAGNOSIS — J449 Chronic obstructive pulmonary disease, unspecified: Secondary | ICD-10-CM | POA: Diagnosis not present

## 2015-12-12 DIAGNOSIS — Z8673 Personal history of transient ischemic attack (TIA), and cerebral infarction without residual deficits: Secondary | ICD-10-CM | POA: Diagnosis not present

## 2015-12-14 DIAGNOSIS — E890 Postprocedural hypothyroidism: Secondary | ICD-10-CM | POA: Diagnosis not present

## 2015-12-14 DIAGNOSIS — I10 Essential (primary) hypertension: Secondary | ICD-10-CM | POA: Diagnosis not present

## 2015-12-14 DIAGNOSIS — J449 Chronic obstructive pulmonary disease, unspecified: Secondary | ICD-10-CM | POA: Diagnosis not present

## 2015-12-14 DIAGNOSIS — Z48812 Encounter for surgical aftercare following surgery on the circulatory system: Secondary | ICD-10-CM | POA: Diagnosis not present

## 2015-12-14 DIAGNOSIS — I70213 Atherosclerosis of native arteries of extremities with intermittent claudication, bilateral legs: Secondary | ICD-10-CM | POA: Diagnosis not present

## 2015-12-14 DIAGNOSIS — Z95820 Peripheral vascular angioplasty status with implants and grafts: Secondary | ICD-10-CM | POA: Diagnosis not present

## 2015-12-14 DIAGNOSIS — Z8673 Personal history of transient ischemic attack (TIA), and cerebral infarction without residual deficits: Secondary | ICD-10-CM | POA: Diagnosis not present

## 2015-12-14 DIAGNOSIS — E119 Type 2 diabetes mellitus without complications: Secondary | ICD-10-CM | POA: Diagnosis not present

## 2015-12-14 DIAGNOSIS — I2510 Atherosclerotic heart disease of native coronary artery without angina pectoris: Secondary | ICD-10-CM | POA: Diagnosis not present

## 2015-12-14 DIAGNOSIS — E785 Hyperlipidemia, unspecified: Secondary | ICD-10-CM | POA: Diagnosis not present

## 2015-12-14 DIAGNOSIS — Z87440 Personal history of urinary (tract) infections: Secondary | ICD-10-CM | POA: Diagnosis not present

## 2015-12-18 DIAGNOSIS — I2510 Atherosclerotic heart disease of native coronary artery without angina pectoris: Secondary | ICD-10-CM | POA: Diagnosis not present

## 2015-12-18 DIAGNOSIS — I70213 Atherosclerosis of native arteries of extremities with intermittent claudication, bilateral legs: Secondary | ICD-10-CM | POA: Diagnosis not present

## 2015-12-18 DIAGNOSIS — Z8673 Personal history of transient ischemic attack (TIA), and cerebral infarction without residual deficits: Secondary | ICD-10-CM | POA: Diagnosis not present

## 2015-12-18 DIAGNOSIS — E890 Postprocedural hypothyroidism: Secondary | ICD-10-CM | POA: Diagnosis not present

## 2015-12-18 DIAGNOSIS — Z87440 Personal history of urinary (tract) infections: Secondary | ICD-10-CM | POA: Diagnosis not present

## 2015-12-18 DIAGNOSIS — Z48812 Encounter for surgical aftercare following surgery on the circulatory system: Secondary | ICD-10-CM | POA: Diagnosis not present

## 2015-12-18 DIAGNOSIS — E785 Hyperlipidemia, unspecified: Secondary | ICD-10-CM | POA: Diagnosis not present

## 2015-12-18 DIAGNOSIS — J449 Chronic obstructive pulmonary disease, unspecified: Secondary | ICD-10-CM | POA: Diagnosis not present

## 2015-12-18 DIAGNOSIS — Z95820 Peripheral vascular angioplasty status with implants and grafts: Secondary | ICD-10-CM | POA: Diagnosis not present

## 2015-12-18 DIAGNOSIS — E119 Type 2 diabetes mellitus without complications: Secondary | ICD-10-CM | POA: Diagnosis not present

## 2015-12-18 DIAGNOSIS — I10 Essential (primary) hypertension: Secondary | ICD-10-CM | POA: Diagnosis not present

## 2015-12-19 DIAGNOSIS — Z87440 Personal history of urinary (tract) infections: Secondary | ICD-10-CM | POA: Diagnosis not present

## 2015-12-19 DIAGNOSIS — E890 Postprocedural hypothyroidism: Secondary | ICD-10-CM | POA: Diagnosis not present

## 2015-12-19 DIAGNOSIS — Z8673 Personal history of transient ischemic attack (TIA), and cerebral infarction without residual deficits: Secondary | ICD-10-CM | POA: Diagnosis not present

## 2015-12-19 DIAGNOSIS — I2510 Atherosclerotic heart disease of native coronary artery without angina pectoris: Secondary | ICD-10-CM | POA: Diagnosis not present

## 2015-12-19 DIAGNOSIS — E119 Type 2 diabetes mellitus without complications: Secondary | ICD-10-CM | POA: Diagnosis not present

## 2015-12-19 DIAGNOSIS — I10 Essential (primary) hypertension: Secondary | ICD-10-CM | POA: Diagnosis not present

## 2015-12-19 DIAGNOSIS — E785 Hyperlipidemia, unspecified: Secondary | ICD-10-CM | POA: Diagnosis not present

## 2015-12-19 DIAGNOSIS — Z95820 Peripheral vascular angioplasty status with implants and grafts: Secondary | ICD-10-CM | POA: Diagnosis not present

## 2015-12-19 DIAGNOSIS — I70213 Atherosclerosis of native arteries of extremities with intermittent claudication, bilateral legs: Secondary | ICD-10-CM | POA: Diagnosis not present

## 2015-12-19 DIAGNOSIS — Z48812 Encounter for surgical aftercare following surgery on the circulatory system: Secondary | ICD-10-CM | POA: Diagnosis not present

## 2015-12-19 DIAGNOSIS — J449 Chronic obstructive pulmonary disease, unspecified: Secondary | ICD-10-CM | POA: Diagnosis not present

## 2015-12-21 DIAGNOSIS — E785 Hyperlipidemia, unspecified: Secondary | ICD-10-CM | POA: Diagnosis not present

## 2015-12-21 DIAGNOSIS — I70213 Atherosclerosis of native arteries of extremities with intermittent claudication, bilateral legs: Secondary | ICD-10-CM | POA: Diagnosis not present

## 2015-12-21 DIAGNOSIS — J449 Chronic obstructive pulmonary disease, unspecified: Secondary | ICD-10-CM | POA: Diagnosis not present

## 2015-12-21 DIAGNOSIS — E890 Postprocedural hypothyroidism: Secondary | ICD-10-CM | POA: Diagnosis not present

## 2015-12-21 DIAGNOSIS — Z95820 Peripheral vascular angioplasty status with implants and grafts: Secondary | ICD-10-CM | POA: Diagnosis not present

## 2015-12-21 DIAGNOSIS — Z8673 Personal history of transient ischemic attack (TIA), and cerebral infarction without residual deficits: Secondary | ICD-10-CM | POA: Diagnosis not present

## 2015-12-21 DIAGNOSIS — I10 Essential (primary) hypertension: Secondary | ICD-10-CM | POA: Diagnosis not present

## 2015-12-21 DIAGNOSIS — E119 Type 2 diabetes mellitus without complications: Secondary | ICD-10-CM | POA: Diagnosis not present

## 2015-12-21 DIAGNOSIS — Z48812 Encounter for surgical aftercare following surgery on the circulatory system: Secondary | ICD-10-CM | POA: Diagnosis not present

## 2015-12-21 DIAGNOSIS — I2510 Atherosclerotic heart disease of native coronary artery without angina pectoris: Secondary | ICD-10-CM | POA: Diagnosis not present

## 2015-12-21 DIAGNOSIS — Z87440 Personal history of urinary (tract) infections: Secondary | ICD-10-CM | POA: Diagnosis not present

## 2015-12-24 ENCOUNTER — Ambulatory Visit: Payer: Medicare Other | Admitting: Physician Assistant

## 2015-12-24 DIAGNOSIS — E119 Type 2 diabetes mellitus without complications: Secondary | ICD-10-CM | POA: Diagnosis not present

## 2015-12-24 DIAGNOSIS — I70213 Atherosclerosis of native arteries of extremities with intermittent claudication, bilateral legs: Secondary | ICD-10-CM | POA: Diagnosis not present

## 2015-12-24 DIAGNOSIS — E890 Postprocedural hypothyroidism: Secondary | ICD-10-CM | POA: Diagnosis not present

## 2015-12-24 DIAGNOSIS — I2510 Atherosclerotic heart disease of native coronary artery without angina pectoris: Secondary | ICD-10-CM | POA: Diagnosis not present

## 2015-12-24 DIAGNOSIS — Z8673 Personal history of transient ischemic attack (TIA), and cerebral infarction without residual deficits: Secondary | ICD-10-CM | POA: Diagnosis not present

## 2015-12-24 DIAGNOSIS — Z95820 Peripheral vascular angioplasty status with implants and grafts: Secondary | ICD-10-CM | POA: Diagnosis not present

## 2015-12-24 DIAGNOSIS — Z48812 Encounter for surgical aftercare following surgery on the circulatory system: Secondary | ICD-10-CM | POA: Diagnosis not present

## 2015-12-24 DIAGNOSIS — J449 Chronic obstructive pulmonary disease, unspecified: Secondary | ICD-10-CM | POA: Diagnosis not present

## 2015-12-24 DIAGNOSIS — E785 Hyperlipidemia, unspecified: Secondary | ICD-10-CM | POA: Diagnosis not present

## 2015-12-24 DIAGNOSIS — I10 Essential (primary) hypertension: Secondary | ICD-10-CM | POA: Diagnosis not present

## 2015-12-24 DIAGNOSIS — Z87440 Personal history of urinary (tract) infections: Secondary | ICD-10-CM | POA: Diagnosis not present

## 2015-12-25 ENCOUNTER — Telehealth: Payer: Self-pay | Admitting: Cardiovascular Disease

## 2015-12-25 ENCOUNTER — Other Ambulatory Visit: Payer: Self-pay | Admitting: *Deleted

## 2015-12-25 NOTE — Telephone Encounter (Signed)
Mrs.Wardle is calling because she is wanting to know should she go back on her blood pressure medication , because it is running high . Please call   Thanks

## 2015-12-25 NOTE — Telephone Encounter (Signed)
Recommendations communicated to patient, who voiced understanding of med changes and instructions. She will follow up at [facility] appt as planned and is aware that calcium channel blockers may take several days to reach BP correcting potential.

## 2015-12-25 NOTE — Telephone Encounter (Signed)
Patient notes she's been having her BP followed at home and that her readings by her physical therapist have been high. He has given her report on her readings for past 2 weeks. She notes she's been running 153-157/73-77. She does not have HR readings, but can obtain these from her PT on Wed and bring to office Fri when she comes for her vascular study. She notes she is on 1 BP med now, but had BP meds that were discontinued. I let patient know I would have pharmD review and see if we can reintroduce previously d/c'ed BP med or increase current dose of her lotensin.

## 2015-12-25 NOTE — Telephone Encounter (Signed)
Looks like she previously took felodipine 10 mg daily.  Did she have any other BP meds that she took as well?  She can re-start the felodipine, have her watch her BP once daily for 2-3 weeks after starting to be sure pressure does not go to low.

## 2015-12-26 DIAGNOSIS — I10 Essential (primary) hypertension: Secondary | ICD-10-CM | POA: Diagnosis not present

## 2015-12-26 DIAGNOSIS — Z87440 Personal history of urinary (tract) infections: Secondary | ICD-10-CM | POA: Diagnosis not present

## 2015-12-26 DIAGNOSIS — E890 Postprocedural hypothyroidism: Secondary | ICD-10-CM | POA: Diagnosis not present

## 2015-12-26 DIAGNOSIS — E785 Hyperlipidemia, unspecified: Secondary | ICD-10-CM | POA: Diagnosis not present

## 2015-12-26 DIAGNOSIS — E119 Type 2 diabetes mellitus without complications: Secondary | ICD-10-CM | POA: Diagnosis not present

## 2015-12-26 DIAGNOSIS — I2510 Atherosclerotic heart disease of native coronary artery without angina pectoris: Secondary | ICD-10-CM | POA: Diagnosis not present

## 2015-12-26 DIAGNOSIS — I70213 Atherosclerosis of native arteries of extremities with intermittent claudication, bilateral legs: Secondary | ICD-10-CM | POA: Diagnosis not present

## 2015-12-26 DIAGNOSIS — J449 Chronic obstructive pulmonary disease, unspecified: Secondary | ICD-10-CM | POA: Diagnosis not present

## 2015-12-26 DIAGNOSIS — Z48812 Encounter for surgical aftercare following surgery on the circulatory system: Secondary | ICD-10-CM | POA: Diagnosis not present

## 2015-12-26 DIAGNOSIS — Z8673 Personal history of transient ischemic attack (TIA), and cerebral infarction without residual deficits: Secondary | ICD-10-CM | POA: Diagnosis not present

## 2015-12-26 DIAGNOSIS — Z95820 Peripheral vascular angioplasty status with implants and grafts: Secondary | ICD-10-CM | POA: Diagnosis not present

## 2015-12-28 ENCOUNTER — Ambulatory Visit (HOSPITAL_COMMUNITY)
Admission: RE | Admit: 2015-12-28 | Discharge: 2015-12-28 | Disposition: A | Discharge status: Home / Self Care | Payer: Medicare Other | Source: Ambulatory Visit | Attending: Cardiovascular Disease | Admitting: Cardiovascular Disease

## 2015-12-28 DIAGNOSIS — I10 Essential (primary) hypertension: Secondary | ICD-10-CM | POA: Insufficient documentation

## 2015-12-28 DIAGNOSIS — I714 Abdominal aortic aneurysm, without rupture: Secondary | ICD-10-CM | POA: Insufficient documentation

## 2015-12-28 DIAGNOSIS — E785 Hyperlipidemia, unspecified: Secondary | ICD-10-CM | POA: Diagnosis not present

## 2015-12-28 DIAGNOSIS — I739 Peripheral vascular disease, unspecified: Secondary | ICD-10-CM | POA: Diagnosis not present

## 2015-12-28 DIAGNOSIS — I779 Disorder of arteries and arterioles, unspecified: Secondary | ICD-10-CM | POA: Diagnosis not present

## 2015-12-28 DIAGNOSIS — Z8673 Personal history of transient ischemic attack (TIA), and cerebral infarction without residual deficits: Secondary | ICD-10-CM | POA: Diagnosis not present

## 2015-12-31 ENCOUNTER — Telehealth: Payer: Self-pay | Admitting: Cardiovascular Disease

## 2015-12-31 DIAGNOSIS — I2510 Atherosclerotic heart disease of native coronary artery without angina pectoris: Secondary | ICD-10-CM | POA: Diagnosis not present

## 2015-12-31 DIAGNOSIS — E119 Type 2 diabetes mellitus without complications: Secondary | ICD-10-CM | POA: Diagnosis not present

## 2015-12-31 DIAGNOSIS — E785 Hyperlipidemia, unspecified: Secondary | ICD-10-CM | POA: Diagnosis not present

## 2015-12-31 DIAGNOSIS — I10 Essential (primary) hypertension: Secondary | ICD-10-CM | POA: Diagnosis not present

## 2015-12-31 DIAGNOSIS — I70213 Atherosclerosis of native arteries of extremities with intermittent claudication, bilateral legs: Secondary | ICD-10-CM | POA: Diagnosis not present

## 2015-12-31 DIAGNOSIS — E890 Postprocedural hypothyroidism: Secondary | ICD-10-CM | POA: Diagnosis not present

## 2015-12-31 DIAGNOSIS — I714 Abdominal aortic aneurysm, without rupture, unspecified: Secondary | ICD-10-CM

## 2015-12-31 DIAGNOSIS — Z48812 Encounter for surgical aftercare following surgery on the circulatory system: Secondary | ICD-10-CM | POA: Diagnosis not present

## 2015-12-31 DIAGNOSIS — I779 Disorder of arteries and arterioles, unspecified: Secondary | ICD-10-CM

## 2015-12-31 DIAGNOSIS — Z95820 Peripheral vascular angioplasty status with implants and grafts: Secondary | ICD-10-CM | POA: Diagnosis not present

## 2015-12-31 DIAGNOSIS — Z8673 Personal history of transient ischemic attack (TIA), and cerebral infarction without residual deficits: Secondary | ICD-10-CM | POA: Diagnosis not present

## 2015-12-31 DIAGNOSIS — Z87440 Personal history of urinary (tract) infections: Secondary | ICD-10-CM | POA: Diagnosis not present

## 2015-12-31 DIAGNOSIS — J449 Chronic obstructive pulmonary disease, unspecified: Secondary | ICD-10-CM | POA: Diagnosis not present

## 2015-12-31 DIAGNOSIS — I739 Peripheral vascular disease, unspecified: Secondary | ICD-10-CM

## 2015-12-31 NOTE — Telephone Encounter (Signed)
Results given to pt. Pt verbalized understanding. Repeat dopplers ordered.

## 2015-12-31 NOTE — Telephone Encounter (Signed)
-----   Message from Lorretta Harp, MD sent at 12/30/2015  9:56 AM EST ----- Patent bilateral iliac stents with near nl ABIs. Repeat 6 montsh

## 2016-01-04 DIAGNOSIS — E119 Type 2 diabetes mellitus without complications: Secondary | ICD-10-CM | POA: Diagnosis not present

## 2016-01-04 DIAGNOSIS — E785 Hyperlipidemia, unspecified: Secondary | ICD-10-CM | POA: Diagnosis not present

## 2016-01-04 DIAGNOSIS — Z95820 Peripheral vascular angioplasty status with implants and grafts: Secondary | ICD-10-CM | POA: Diagnosis not present

## 2016-01-04 DIAGNOSIS — Z48812 Encounter for surgical aftercare following surgery on the circulatory system: Secondary | ICD-10-CM | POA: Diagnosis not present

## 2016-01-04 DIAGNOSIS — I2510 Atherosclerotic heart disease of native coronary artery without angina pectoris: Secondary | ICD-10-CM | POA: Diagnosis not present

## 2016-01-04 DIAGNOSIS — J449 Chronic obstructive pulmonary disease, unspecified: Secondary | ICD-10-CM | POA: Diagnosis not present

## 2016-01-04 DIAGNOSIS — I10 Essential (primary) hypertension: Secondary | ICD-10-CM | POA: Diagnosis not present

## 2016-01-04 DIAGNOSIS — Z8673 Personal history of transient ischemic attack (TIA), and cerebral infarction without residual deficits: Secondary | ICD-10-CM | POA: Diagnosis not present

## 2016-01-04 DIAGNOSIS — I70213 Atherosclerosis of native arteries of extremities with intermittent claudication, bilateral legs: Secondary | ICD-10-CM | POA: Diagnosis not present

## 2016-01-04 DIAGNOSIS — E890 Postprocedural hypothyroidism: Secondary | ICD-10-CM | POA: Diagnosis not present

## 2016-01-04 DIAGNOSIS — Z87440 Personal history of urinary (tract) infections: Secondary | ICD-10-CM | POA: Diagnosis not present

## 2016-01-08 ENCOUNTER — Telehealth: Payer: Self-pay | Admitting: Cardiovascular Disease

## 2016-01-08 NOTE — Telephone Encounter (Signed)
Home Health Certification and Plan of Care and Addendum to Plan of Treatment  Certification Period: 12/06/15-02/03/16  Signed and faxed to number provided.

## 2016-01-14 ENCOUNTER — Encounter: Payer: Self-pay | Admitting: Physician Assistant

## 2016-01-14 ENCOUNTER — Ambulatory Visit (INDEPENDENT_AMBULATORY_CARE_PROVIDER_SITE_OTHER): Payer: Medicare Other | Admitting: Physician Assistant

## 2016-01-14 VITALS — BP 100/56 | HR 92 | Ht 66.0 in | Wt 112.5 lb

## 2016-01-14 DIAGNOSIS — I10 Essential (primary) hypertension: Secondary | ICD-10-CM

## 2016-01-14 DIAGNOSIS — Z720 Tobacco use: Secondary | ICD-10-CM | POA: Diagnosis not present

## 2016-01-14 DIAGNOSIS — I739 Peripheral vascular disease, unspecified: Secondary | ICD-10-CM

## 2016-01-14 DIAGNOSIS — Z79899 Other long term (current) drug therapy: Secondary | ICD-10-CM

## 2016-01-14 LAB — CBC
HCT: 37.9 % (ref 35.0–45.0)
HEMOGLOBIN: 11.9 g/dL (ref 11.7–15.5)
MCH: 29.8 pg (ref 27.0–33.0)
MCHC: 31.4 g/dL — AB (ref 32.0–36.0)
MCV: 94.8 fL (ref 80.0–100.0)
MPV: 10 fL (ref 7.5–12.5)
Platelets: 323 10*3/uL (ref 140–400)
RBC: 4 MIL/uL (ref 3.80–5.10)
RDW: 14.6 % (ref 11.0–15.0)
WBC: 9.5 10*3/uL (ref 3.8–10.8)

## 2016-01-14 NOTE — Patient Instructions (Addendum)
Medication Instructions:  DECREASE- Felodipine 5 mg daily  Labwork: CBC  Testing/Procedures: None Ordered  Follow-Up: Your physician recommends that you schedule a follow-up appointment in: 6 Months with Dr Gwenlyn Found   Any Other Special Instructions Will Be Listed Below (If Applicable).     If you need a refill on your cardiac medications before your next appointment, please call your pharmacy.

## 2016-01-14 NOTE — Progress Notes (Signed)
Cardiology Office Note   Date:  01/14/2016   ID:  Crystal Osborne, DOB 1931/10/05, MRN [id number]  PCP:  Kathlene November, MD  Cardiologist:  Dr Stacy Gardner, PA-C   Chief Complaint  Patient presents with  . Follow-up    has a few questions     History of Present Illness: Crystal Osborne is a 82 y.o. female with a history of tobacco abuse, HTN, HLD, and TIA. S/p evaluation by Dr Gwenlyn Found for claudication.   Admit 10/23-10/25 for claudication, s/p PV cath w/ PTA and stent w/ HSRA of bilateral iliac arteries. She had bilateral groin hematomas>>anemia, but H&H stabilized, not transfused. Bradycardia noted, metoprolol d/c'd. 11/17, ABIs near normal. 11/20 phone messages regarding high BP, felodipine 10 mg qd restarted.  Crystal Osborne presents for follow up evaluation.  She has felt a lack of energy since she restarted the felodipine. She was doing ok at rehab, but has not felt like doing much at home. Her BP has been much lower on the felodipine, she wonders if she could take a lower dose.   Her legs have been feeling great, if she had more energy she would do more. She has not had LE edema. No chest pain, no SOB.   Her stools have been very dark, she wonders if she is losing blood. She is on iron tabs. On 10/25, H&H was 7.8/23.6 after post-procedure groin hematomas.    Past Medical History:  Diagnosis Date  . AAA (abdominal aortic aneurysm) [facility])    sees Dr Eden Lathe   . B12 deficiency   . Cancer of right breast ([place]) 1960  . CAROTID ARTERY DISEASE 11/28/2008   sees Dr Oneida Alar   . COPD (chronic obstructive pulmonary disease) ([place]) 01/25/2014  . CVA (cerebrovascular accident) ([place]) 2006  . Diabetes mellitus ([place]) 05/29/2010   A1c 6.0 on October 2011   . Hyperlipidemia   . Hypertension   . Osteoporosis 2003  . Pneumonia ~ 2014; ~ 2015  . Prolapse of female bladder, acquired   . Shingles    repeated episone 9/11 went to a UC  . TIA (transient ischemic attack) ~ 2005  . Transient  hypothyroidism    d/t thyroidectomy  . UTI (lower urinary tract infection) May 2013    Past Surgical History:  Procedure Laterality Date  . ABDOMINAL HYSTERECTOMY     no oophorectomy  . CATARACT EXTRACTION W/ INTRAOCULAR LENS  IMPLANT, BILATERAL Bilateral   . DILATION AND CURETTAGE OF UTERUS    . MASTECTOMY, RADICAL Right 1960s   remotely   . PERIPHERAL VASCULAR CATHETERIZATION  12/03/2015  . PERIPHERAL VASCULAR CATHETERIZATION N/A 12/03/2015   Procedure: Abdominal Aortogram;  Surgeon: Lorretta Harp, MD;  Location: [place] [facility];  Service: Cardiovascular;  Laterality: N/A;  . PERIPHERAL VASCULAR CATHETERIZATION Bilateral 12/03/2015   Procedure: Lower Extremity Angiography;  Surgeon: Lorretta Harp, MD;  Location: [place] [facility];  Service: Cardiovascular;  Laterality: Bilateral;  . PERIPHERAL VASCULAR CATHETERIZATION Bilateral 12/03/2015   Procedure: Peripheral Vascular Intervention;  Surgeon: Lorretta Harp, MD;  Location: [place] [facility];  Service: Cardiovascular;  Laterality: Bilateral;  22mmx58mm [place] bilateral Common Iliac Arteries  . PERIPHERAL VASCULAR CATHETERIZATION Bilateral 12/03/2015   Procedure: Peripheral Vascular Atherectomy;  Surgeon: Lorretta Harp, MD;  Location: [place] [facility];  Service: Cardiovascular;  Laterality: Bilateral;  Common Iliac Arteries  . THYROIDECTOMY      Current Outpatient Prescriptions  Medication Sig Dispense Refill  .  acetaminophen (TYLENOL) 500 MG tablet Take 500 mg by mouth 2 (two) times daily as needed (for pain.).    Marland Kitchen AMBULATORY NON FORMULARY MEDICATION Right Breast Prosthesis  Dx: V15.29 1 Device 0  . aspirin EC 81 MG tablet Take 81 mg by mouth every evening.    Marland Kitchen atorvastatin (LIPITOR) 40 MG tablet Take 1 tablet (40 mg total) by mouth daily. (Patient taking differently: Take 40 mg by mouth every evening. ) 90 tablet 1  . benazepril (LOTENSIN) 20 MG tablet Take 1 tablet (20 mg total) by mouth 2 (two) times daily.  180 tablet 1  . clopidogrel (PLAVIX) 75 MG tablet Take 1 tablet (75 mg total) by mouth daily with breakfast. 30 tablet 11  . felodipine (PLENDIL) 10 MG 24 hr tablet Take 5 mg by mouth daily.    . ferrous sulfate 325 (65 FE) MG tablet Take 1 tablet (325 mg total) by mouth daily with breakfast.  3  . vitamin B-12 (CYANOCOBALAMIN) 500 MCG tablet Take 500 mcg by mouth every evening.     No current facility-administered medications for this visit.     Allergies:   Ciprofloxacin hcl; Septra [sulfamethoxazole-trimethoprim]; and Codeine    Social History:  The patient  reports that she has been smoking Cigarettes.  She has a 33.50 pack-year smoking history. She has never used smokeless tobacco. She reports that she drinks alcohol. She reports that she does not use drugs.   Family History:  The patient's family history includes Cancer in her other.    ROS:  Please see the history of present illness. All other systems are reviewed and negative.    PHYSICAL EXAM: VS:  BP (!) 100/56 (BP Location: Left Arm, Patient Position: Sitting, Cuff Size: Small)   Pulse 92   Ht 5\' 6"  (1.676 m)   Wt 112 lb 8 oz (51 kg)   BMI 18.16 kg/m  , BMI Body mass index is 18.16 kg/m. GEN: Well nourished, well developed, female in no acute distress  HEENT: normal for age  Neck: no JVD, bilateral carotid bruit, no masses Cardiac: RRR; no murmur, no rubs, or gallops Respiratory:  clear to auscultation bilaterally, normal work of breathing GI: soft, nontender, nondistended, + BS MS: no deformity or atrophy; no edema; distal pulses are 1-2+ in all 4 extremities   Skin: warm and dry, no rash Neuro:  Strength and sensation are intact Psych: euthymic mood, full affect   EKG:  EKG is not ordered today.  Recent Labs: 05/18/2015: ALT 11 10/29/2015: TSH 4.36 12/04/2015: BUN 19; Creatinine, Ser 0.93; Platelets 193; Potassium 3.9; Sodium 138 12/05/2015: Hemoglobin 7.8    Lipid Panel    Component Value Date/Time    CHOL 138 05/18/2015 1133   TRIG 94.0 05/18/2015 1133   TRIG 112 01/06/2006 0841   HDL 49.10 05/18/2015 1133   CHOLHDL 3 05/18/2015 1133   VLDL 18.8 05/18/2015 1133   LDLCALC 71 05/18/2015 1133   LDLDIRECT 149.2 08/03/2006 1054     Wt Readings from Last 3 Encounters:  01/14/16 112 lb 8 oz (51 kg)  12/05/15 114 lb 10.2 oz (52 kg)  11/26/15 116 lb (52.6 kg)     Other studies Reviewed: Additional studies/ records that were reviewed today include: office notes, testing and other records.  ASSESSMENT AND PLAN:  1.  HTN: Her response to the felodipine is very good. Her newly lower BP may be responsible for her decreased energy level. She is to cut the felodipine in half, call  us if she cannot. She can cut the benazepril in half if the felodipine cannot be cut. Call for problems or concerns.   2. Anemia: Pre-PV cath H&H were normal. She will get a CBC today. If her H&H has not improved>>further evaluation by IM is indicated. No evidence of GI upset, dark stools reported, but pt mentions being on iron tabs.  3. PAD: No sx, keep scheduled f/u.   4. Tobacco use: She has not quit smoking, says she is trying to cut back. Encouraged her in this.   Current medicines are reviewed at length with the patient today.  The patient has concerns regarding medicines. Concerns were addressed.  The following changes have been made:  Decrease felodipine  Labs/ tests ordered today include:   Orders Placed This Encounter  Procedures  . CBC     Disposition:   FU with Dr Gwenlyn Found  Signed, Rosaria Ferries, PA-C  01/14/2016 3:13 PM    [place] Phone: [phone]; Fax: [phone]  This note was written with the assistance of speech recognition software. Please excuse any transcriptional errors.

## 2016-02-26 ENCOUNTER — Other Ambulatory Visit: Payer: Self-pay | Admitting: Internal Medicine

## 2016-03-06 ENCOUNTER — Telehealth: Payer: Self-pay | Admitting: Internal Medicine

## 2016-03-06 ENCOUNTER — Telehealth: Payer: Self-pay | Admitting: Cardiovascular Disease

## 2016-03-06 NOTE — Telephone Encounter (Signed)
LVM advising patient to schedule medicare wellness appointment

## 2016-03-06 NOTE — Telephone Encounter (Signed)
Home Health Certification and Plan of Care  Certification Period: 12/06/15- 02/03/16  Signed by Dr. Gwenlyn Found and faxed to number provided on 03/04/16.

## 2016-03-17 ENCOUNTER — Telehealth: Payer: Self-pay | Admitting: Internal Medicine

## 2016-03-17 NOTE — Telephone Encounter (Signed)
Called patient to schedule awv appt. Left msg for patient to call office to schedule appt.

## 2016-04-22 ENCOUNTER — Encounter: Payer: Self-pay | Admitting: Internal Medicine

## 2016-04-22 ENCOUNTER — Ambulatory Visit (INDEPENDENT_AMBULATORY_CARE_PROVIDER_SITE_OTHER): Payer: Medicare Other | Admitting: Internal Medicine

## 2016-04-22 VITALS — BP 124/72 | HR 77 | Temp 97.6°F | Resp 14 | Ht 66.0 in | Wt 110.2 lb

## 2016-04-22 DIAGNOSIS — I739 Peripheral vascular disease, unspecified: Secondary | ICD-10-CM | POA: Diagnosis not present

## 2016-04-22 DIAGNOSIS — I10 Essential (primary) hypertension: Secondary | ICD-10-CM | POA: Diagnosis not present

## 2016-04-22 DIAGNOSIS — D649 Anemia, unspecified: Secondary | ICD-10-CM | POA: Diagnosis not present

## 2016-04-22 DIAGNOSIS — R7303 Prediabetes: Secondary | ICD-10-CM

## 2016-04-22 LAB — BASIC METABOLIC PANEL
BUN: 19 mg/dL (ref 6–23)
CALCIUM: 9.6 mg/dL (ref 8.4–10.5)
CHLORIDE: 109 meq/L (ref 96–112)
CO2: 25 mEq/L (ref 19–32)
Creatinine, Ser: 0.93 mg/dL (ref 0.40–1.20)
GFR: 61.23 mL/min (ref >60.00)
Glucose, Bld: 93 mg/dL (ref 70–99)
Potassium: 4.5 mEq/L (ref 3.5–5.1)
SODIUM: 141 meq/L (ref 135–145)

## 2016-04-22 LAB — HEMOGLOBIN: Hemoglobin: 12.9 g/dL (ref 12.0–15.0)

## 2016-04-22 LAB — FERRITIN: Ferritin: 37.4 ng/mL (ref 10.0–291.0)

## 2016-04-22 LAB — HEMOGLOBIN A1C: HEMOGLOBIN A1C: 5.9 % (ref 4.6–6.5)

## 2016-04-22 LAB — IRON: IRON: 74 ug/dL (ref 42–145)

## 2016-04-22 MED ORDER — FELODIPINE ER 5 MG PO TB24: 5.0000 mg | ORAL_TABLET | Freq: Every day | ORAL | 2 refills | Status: DC

## 2016-04-22 NOTE — Progress Notes (Signed)
Pre visit review using our clinic review tool, if applicable. No additional management support is needed unless otherwise documented below in the visit note. 

## 2016-04-22 NOTE — Assessment & Plan Note (Signed)
Prediabetes: Diet control, check A1c HTN: Well-controlled on benazepril and felodipine 5 mg (RF sent); check a BMP [id number] great after B iliac stents,no claudication.  Anemia: Developed anemia after iliac stents, intolerant to long-term use of iron due to constipation. Check a hemoglobin, iron and ferritin. Primary care: Declined a Prevnar today  RTC 4 months, CPX

## 2016-04-22 NOTE — Patient Instructions (Addendum)
GO TO THE LAB : Get the blood work     GO TO THE FRONT DESK Schedule your next appointment for a  Yearly check up in 4-5 months, fasting  Also recommend a Medicare wellness with one of our RNs  Ok Benadryl (low dose, 12.5 mg) as  neded for itching

## 2016-04-22 NOTE — Progress Notes (Signed)
Subjective:    Patient ID: Crystal Osborne, female    DOB: 28-Aug-1931, 83 y.o.   MRN: [id number]  DOS:  04/22/2016 Type of visit - description : Routine checkup  Interval history: Claudication: Since the last visit, had ilia  stents, doing great. Developed anemia after the stents  procedure, was intolerant to iron long term due to constipation, took it only for a few weeks. HTN: Good med compliance, no ambulatory BPs Prediabetes: Due for a A1c  Review of Systems Denies chest pain or difficulty breathing No nausea or vomiting. No dysuria or difficulty urinating C/o easy bruising and feeling cold all the time, she thinks related to Plavix initiation. She denies any headaches, blood in the stools or blood in the urine.  Past Medical History:  Diagnosis Date  . AAA (abdominal aortic aneurysm) [facility])    sees Dr Eden Lathe   . B12 deficiency   . Cancer of right breast ([place]) 1960  . CAROTID ARTERY DISEASE 11/28/2008   sees Dr Oneida Alar   . COPD (chronic obstructive pulmonary disease) ([place]) 01/25/2014  . CVA (cerebrovascular accident) ([place]) 2006  . Diabetes mellitus ([place]) 05/29/2010   A1c 6.0 on October 2011   . Hyperlipidemia   . Hypertension   . Osteoporosis 2003  . Pneumonia ~ 2014; ~ 2015  . Prolapse of female bladder, acquired   . Shingles    repeated episone 9/11 went to a UC  . TIA (transient ischemic attack) ~ 2005  . Transient hypothyroidism    d/t thyroidectomy  . UTI (lower urinary tract infection) May 2013    Past Surgical History:  Procedure Laterality Date  . ABDOMINAL HYSTERECTOMY     no oophorectomy  . CATARACT EXTRACTION W/ INTRAOCULAR LENS  IMPLANT, BILATERAL Bilateral   . DILATION AND CURETTAGE OF UTERUS    . MASTECTOMY, RADICAL Right 1960s   remotely   . PERIPHERAL VASCULAR CATHETERIZATION  12/03/2015  . PERIPHERAL VASCULAR CATHETERIZATION N/A 12/03/2015   Procedure: Abdominal Aortogram;  Surgeon: Lorretta Harp, MD;  Location: [facility];  Service:  Cardiovascular;  Laterality: N/A;  . PERIPHERAL VASCULAR CATHETERIZATION Bilateral 12/03/2015   Procedure: Lower Extremity Angiography;  Surgeon: Lorretta Harp, MD;  Location: [facility];  Service: Cardiovascular;  Laterality: Bilateral;  . PERIPHERAL VASCULAR CATHETERIZATION Bilateral 12/03/2015   Procedure: Peripheral Vascular Intervention;  Surgeon: Lorretta Harp, MD;  Location: [facility];  Service: Cardiovascular;  Laterality: Bilateral;  27mmx58mm Lifestream bilateral Common Iliac Arteries  . PERIPHERAL VASCULAR CATHETERIZATION Bilateral 12/03/2015   Procedure: Peripheral Vascular Atherectomy;  Surgeon: Lorretta Harp, MD;  Location: [facility];  Service: Cardiovascular;  Laterality: Bilateral;  Common Iliac Arteries  . THYROIDECTOMY      Social History   Social History  . Marital status: Divorced    Spouse name: N/A  . Number of children: 3  . Years of education: N/A   Occupational History  . retired     Social History Main Topics  . Smoking status: Current Every Day Smoker    Packs/day: 0.50    Years: 67.00    Types: Cigarettes  . Smokeless tobacco: Never Used  . Alcohol use 0.0 oz/week     Comment: 12/03/2015 "I've had a few mixed drinks in my lifetime; holidays"  . Drug use: No  . Sexual activity: Not on file   Other Topics Concern  . Not on file   Social History Narrative   Lives by herself  Has 2 children in [place], 1 son in [place]   48 y/o son, disabled by stroke (09-2011)      Son w/ bone cancer    Still drives   Has a sister that lives in the [place], [place] ( [place], [place] )--died 06/21/22              Allergies as of 04/22/2016      Reactions   Ciprofloxacin Hcl Itching   Septra [sulfamethoxazole-trimethoprim] Itching   Codeine Rash   Made her feel very strange      Medication List       Accurate as of 04/22/16  1:16 PM. Always use your most recent med list.          acetaminophen 500 MG tablet Commonly known as:   TYLENOL Take 500 mg by mouth 2 (two) times daily as needed (for pain.).   AMBULATORY NON FORMULARY MEDICATION Right Breast Prosthesis  Dx: V25.36   aspirin EC 81 MG tablet Take 81 mg by mouth every evening.   atorvastatin 40 MG tablet Commonly known as:  LIPITOR Take 1 tablet (40 mg total) by mouth daily.   benazepril 20 MG tablet Commonly known as:  LOTENSIN Take 1 tablet (20 mg total) by mouth 2 (two) times daily.   clopidogrel 75 MG tablet Commonly known as:  PLAVIX Take 1 tablet (75 mg total) by mouth daily with breakfast.   felodipine 5 MG 24 hr tablet Commonly known as:  PLENDIL Take 1 tablet (5 mg total) by mouth daily.   vitamin B-12 500 MCG tablet Commonly known as:  CYANOCOBALAMIN Take 500 mcg by mouth every evening.          Objective:   Physical Exam BP 124/72 (BP Location: Left Arm, Patient Position: Sitting, Cuff Size: Small)   Pulse 77   Temp 97.6 F (36.4 C) (Oral)   Resp 14   Ht 5\' 6"  (1.676 m)   Wt 110 lb 4 oz (50 kg)   SpO2 93%   BMI 17.79 kg/m  General:   Well developed, well nourished . NAD.  HEENT:  Normocephalic . Face symmetric, atraumatic Lungs:  CTA B Normal respiratory effort, no intercostal retractions, no accessory muscle use. Heart: RRR,  no murmur.  No pretibial edema bilaterally  Skin: Few bruises on the upper extremities, mostly on the hands and wrists, no hematomas Neurologic:  alert & oriented X3.  Speech normal, gait appropriate for age and unassisted Psych--  Cognition and judgment appear intact.  Cooperative with normal attention span and concentration.  Behavior appropriate. No anxious or depressed appearing.      Assessment & Plan:  Assessment > Prediabetes HTN Hyperlipidemia Insomnia-- rarely has xanax  COPD: smoker, XR suggestive of dx, no PFTs Osteoporosis 2003-- refused dexas consistently  B12 def (185 05-2013) CV: ---CVA 2006 ---Carotid artery dz--last [place] 09-2011 40-59% B- ICA, declined further  testing ---AAA --last [place]  05-2015, 1 year -- PVD, + ABIs 10-2015, 11-2015: B iliac stents Shingles 10-2009 and ~ 07-2015 (seen elsewhere, L face) Status post thyroidectomy, transient hypothyroidism Breast cancer 1960 Allergies- hives sometimes , saw derm    PLAN  Prediabetes: Diet control, check A1c HTN: Well-controlled on benazepril and felodipine 5 mg (RF sent); check a BMP UYQ:IHKVQ great after B iliac stents,no claudication.  Anemia: Developed anemia after iliac stents, intolerant to long-term use of iron due to constipation. Check a hemoglobin, iron and ferritin. Primary care: Declined a Prevnar today  RTC 4 months, CPX

## 2016-05-05 DIAGNOSIS — H5203 Hypermetropia, bilateral: Secondary | ICD-10-CM | POA: Diagnosis not present

## 2016-05-06 ENCOUNTER — Other Ambulatory Visit: Payer: Self-pay | Admitting: Internal Medicine

## 2016-05-06 NOTE — Telephone Encounter (Signed)
Caller name: Relationship to patient: Self Can be reached: [phone]  Pharmacy:  [facility], [place] [phone] (Phone) [phone] (Fax)     Reason for call: Patient is requesting refill on benazepril (LOTENSIN) 20 MG tablet. States it was supposed to be sent in a few days ago.

## 2016-05-06 NOTE — Telephone Encounter (Signed)
Rx sent 

## 2016-06-27 ENCOUNTER — Ambulatory Visit (HOSPITAL_COMMUNITY)
Admission: RE | Admit: 2016-06-27 | Discharge: 2016-06-27 | Disposition: A | Discharge status: Home / Self Care | Payer: Medicare Other | Source: Ambulatory Visit | Attending: Cardiovascular Disease | Admitting: Cardiovascular Disease

## 2016-06-27 ENCOUNTER — Ambulatory Visit (INDEPENDENT_AMBULATORY_CARE_PROVIDER_SITE_OTHER): Payer: Medicare Other | Admitting: Cardiovascular Disease

## 2016-06-27 ENCOUNTER — Other Ambulatory Visit: Payer: Self-pay | Admitting: Cardiovascular Disease

## 2016-06-27 ENCOUNTER — Encounter: Payer: Self-pay | Admitting: Cardiovascular Disease

## 2016-06-27 ENCOUNTER — Ambulatory Visit (HOSPITAL_COMMUNITY)
Admission: RE | Admit: 2016-06-27 | Discharge: 2016-06-27 | Disposition: A | Discharge status: Home / Self Care | Payer: Medicare Other | Source: Ambulatory Visit | Attending: Internal Medicine | Admitting: Internal Medicine

## 2016-06-27 VITALS — BP 105/56 | HR 71 | Ht 66.0 in | Wt 108.0 lb

## 2016-06-27 DIAGNOSIS — I70203 Unspecified atherosclerosis of native arteries of extremities, bilateral legs: Secondary | ICD-10-CM | POA: Diagnosis not present

## 2016-06-27 DIAGNOSIS — F17210 Nicotine dependence, cigarettes, uncomplicated: Secondary | ICD-10-CM | POA: Insufficient documentation

## 2016-06-27 DIAGNOSIS — I714 Abdominal aortic aneurysm, without rupture, unspecified: Secondary | ICD-10-CM

## 2016-06-27 DIAGNOSIS — I10 Essential (primary) hypertension: Secondary | ICD-10-CM | POA: Diagnosis not present

## 2016-06-27 DIAGNOSIS — I739 Peripheral vascular disease, unspecified: Secondary | ICD-10-CM | POA: Diagnosis not present

## 2016-06-27 DIAGNOSIS — I779 Disorder of arteries and arterioles, unspecified: Secondary | ICD-10-CM

## 2016-06-27 DIAGNOSIS — J449 Chronic obstructive pulmonary disease, unspecified: Secondary | ICD-10-CM | POA: Insufficient documentation

## 2016-06-27 DIAGNOSIS — I700 Atherosclerosis of aorta: Secondary | ICD-10-CM | POA: Insufficient documentation

## 2016-06-27 DIAGNOSIS — E1151 Type 2 diabetes mellitus with diabetic peripheral angiopathy without gangrene: Secondary | ICD-10-CM | POA: Insufficient documentation

## 2016-06-27 DIAGNOSIS — E785 Hyperlipidemia, unspecified: Secondary | ICD-10-CM | POA: Diagnosis not present

## 2016-06-27 DIAGNOSIS — Z8673 Personal history of transient ischemic attack (TIA), and cerebral infarction without residual deficits: Secondary | ICD-10-CM | POA: Diagnosis not present

## 2016-06-27 NOTE — Assessment & Plan Note (Signed)
History of symptomatic peripheral arterial disease status post bilateral calcified ostial common iliac artery diamondback orbital rotational atherectomy, PTCA and coverage stenting with Lifestream, covered stents resulting in resolution of her claudication symptoms. Her follow-up Dopplers performed today revealed stents to be patent with stable ABIs. We'll continue to follow this on a semiannual basis. I will see her back in one year. She remains on dual antiplatelet therapy.

## 2016-06-27 NOTE — Progress Notes (Signed)
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 She was referred by Dr. Larose Kells for peripheral vascular evaluation because of lifestyle limiting claudication. I last saw her in the office 11/09/15. Her risk factor profile is notable for continued tobacco abuse of one half pack per day having smoked 35 pack years. She has treated hypertension and hyperlipidemia. There is a family history of heart disease. She has never had a heart attack but that has had a TIA remotely. She denies chest pain or shortness of breath. She's had bilateral lower extremity claudication right slightly worse than left which began 3 months ago. Dopplers performed in our office 10/22/15 revealed a right ABI of 0.68 and a left ABI 0.78. She did have high frequency signals in the origins of both iliac arteries. I performed angiography 12/03/15 revealing high-grade calcified ostial bilateral iliac artery stenoses with three-vessel runoff. I performed diamondback orbital rotational atherectomy, PTA and covered stenting using Lifestream cover stents of both iliac ostia. Her Dopplers improved and her claudication resolved. This has remained durable by Doppler studies today.    Current Outpatient Prescriptions  Medication Sig Dispense Refill  . acetaminophen (TYLENOL) 500 MG tablet Take 500 mg by mouth 2 (two) times daily as needed (for pain.).    Marland Kitchen AMBULATORY NON FORMULARY MEDICATION Right Breast Prosthesis  Dx: V15.29 1 Device 0  . aspirin EC 81 MG tablet Take 81 mg by mouth every evening.    Marland Kitchen atorvastatin (LIPITOR) 40 MG tablet Take 1 tablet (40 mg total) by mouth daily. 90 tablet 1  . benazepril (LOTENSIN) 20 MG tablet Take 1 tablet (20 mg total) by  mouth 2 (two) times daily. 180 tablet 1  . clopidogrel (PLAVIX) 75 MG tablet Take 1 tablet (75 mg total) by mouth daily with breakfast. 30 tablet 11  . felodipine (PLENDIL) 5 MG 24 hr tablet Take 1 tablet (5 mg total) by mouth daily. 90 tablet 2  . vitamin B-12 (CYANOCOBALAMIN) 500 MCG tablet Take 500 mcg by mouth every evening.     No current facility-administered medications for this visit.     Allergies  Allergen Reactions  . Ciprofloxacin Hcl Itching  . Codeine Rash    Made her feel very strange  . Septra [Sulfamethoxazole-Trimethoprim] Itching    Social History   Social History  . Marital status: Divorced    Spouse name: N/A  . Number of children: 3  . Years of education: N/A   Occupational History  . retired     Social History Main Topics  . Smoking status: Current Every Day Smoker    Packs/day: 0.50    Years: 67.00    Types: Cigarettes  . Smokeless tobacco: Never Used  . Alcohol use 0.0 oz/week     Comment: 12/03/2015 "I've had a few mixed drinks in my lifetime; holidays"  . Drug use: No  . Sexual activity: Not on file   Other Topics Concern  . Not on file   Social History Narrative   Lives by herself   Has 2 children in [place], 1 son in [place]   74 y/o son, disabled by stroke (09-2011)      Son w/ bone cancer    Still drives  Has a sister that lives in the [place], [place] )--died 2022-06-07             Review of Systems: General: negative for chills, fever, night sweats or weight changes.  Cardiovascular: negative for chest pain, dyspnea on exertion, edema, orthopnea, palpitations, paroxysmal nocturnal dyspnea or shortness of breath Dermatological: negative for rash Respiratory: negative for cough or wheezing Urologic: negative for hematuria Abdominal: negative for nausea, vomiting, diarrhea, bright red blood per rectum, melena, or hematemesis Neurologic: negative for visual changes, syncope, or dizziness All other systems reviewed and are  otherwise negative except as noted above.    Blood pressure (!) 105/56, pulse 71, height 5\' 6"  (1.676 m), weight 108 lb (49 kg), SpO2 94 %.  General appearance: alert and no distress Neck: no adenopathy, no JVD, supple, symmetrical, trachea midline, thyroid not enlarged, symmetric, no tenderness/mass/nodules and Soft bilateral carotid bruits Lungs: no adenopathy, no JVD, supple, symmetrical, trachea midline, thyroid not enlarged, symmetric, no tenderness/mass/nodules and Soft bilateral carotid bruits Heart: regular rate and rhythm, S1, S2 normal, no murmur, click, rub or gallop Extremities: extremities normal, atraumatic, no cyanosis or edema  EKG not performed today  ASSESSMENT AND PLAN:   Hyperlipidemia History of hyperlipidemia on statin therapy lipid profile performed 05/18/15 revealing total cholesterol 1:30, LDL 71 and HDL 49.  TOBACCO ABUSE History of ongoing tobacco abuse recalcitrant to Dr. modification  Essential hypertension History of essential hypertension with blood pressure measured at 105/56. She is on Plendil and benazepril. Continue current meds at current dosing  Carotid artery disease History of moderate bilateral ICA stenosis by duplex ultrasound October 2017. We will repeat this in one year.  Abdominal aortic aneurysm History of small abdominal aortic aneurysm measuring 3.1 x 3.1 cm by mouth we'll continue to follow this by annual basis.  PVD (peripheral vascular disease) ([place]) History of symptomatic peripheral arterial disease status post bilateral calcified ostial common iliac artery diamondback orbital rotational atherectomy, PTCA and coverage stenting with Lifestream, covered stents resulting in resolution of her claudication symptoms. Her follow-up Dopplers performed today revealed stents to be patent with stable ABIs. We'll continue to follow this on a semiannual basis. I will see her back in one year. She remains on dual antiplatelet  therapy.      Lorretta Harp MD FACP,FACC,FAHA, [facility] 06/27/2016 4:24 PM

## 2016-06-27 NOTE — Assessment & Plan Note (Signed)
History of essential hypertension with blood pressure measured at 105/56. She is on Plendil and benazepril. Continue current meds at current dosing

## 2016-06-27 NOTE — Assessment & Plan Note (Signed)
History of small abdominal aortic aneurysm measuring 3.1 x 3.1 cm by mouth we'll continue to follow this by annual basis.

## 2016-06-27 NOTE — Assessment & Plan Note (Signed)
History of moderate bilateral ICA stenosis by duplex ultrasound October 2017. We will repeat this in one year.

## 2016-06-27 NOTE — Assessment & Plan Note (Signed)
History of ongoing tobacco abuse recalcitrant to Dr. Garth Bigness

## 2016-06-27 NOTE — Patient Instructions (Signed)
Medication Instructions: Your physician recommends that you continue on your current medications as directed. Please refer to the Current Medication list given to you today.  Testing/Procedures: Your physician has requested that you have a carotid duplex. This test is an ultrasound of the carotid arteries in your neck. It looks at blood flow through these arteries that supply the brain with blood. Allow one hour for this exam. There are no restrictions or special instructions.  Your physician has requested that you have a lower extremity arterial duplex. During this test, ultrasound is used to evaluate arterial blood flow in the legs. Allow one hour for this exam. There are no restrictions or special instructions.  Your physician has requested that you have an ankle brachial index (ABI). During this test an ultrasound and blood pressure cuff are used to evaluate the arteries that supply the arms and legs with blood. Allow thirty minutes for this exam. There are no restrictions or special instructions.  (In 1 year)   Follow-Up: Your physician wants you to follow-up in: 1 year with Dr. Veleta Miners dopplers are completed. You will receive a reminder letter in the mail two months in advance. If you don't receive a letter, please call our office to schedule the follow-up appointment.  If you need a refill on your cardiac medications before your next appointment, please call your pharmacy.

## 2016-06-27 NOTE — Assessment & Plan Note (Signed)
History of hyperlipidemia on statin therapy lipid profile performed 05/18/15 revealing total cholesterol 1:30, LDL 71 and HDL 49.

## 2016-07-02 ENCOUNTER — Other Ambulatory Visit: Payer: Self-pay | Admitting: Cardiovascular Disease

## 2016-07-02 DIAGNOSIS — I739 Peripheral vascular disease, unspecified: Secondary | ICD-10-CM

## 2016-07-20 ENCOUNTER — Emergency Department (HOSPITAL_COMMUNITY)
Admission: EM | Admit: 2016-07-20 | Discharge: 2016-07-20 | Disposition: A | Discharge status: Home / Self Care | Payer: Medicare Other | Attending: Emergency Medicine | Admitting: Emergency Medicine

## 2016-07-20 ENCOUNTER — Encounter (HOSPITAL_COMMUNITY): Payer: Self-pay | Admitting: Nurse Practitioner

## 2016-07-20 ENCOUNTER — Emergency Department (HOSPITAL_COMMUNITY): Payer: Medicare Other

## 2016-07-20 DIAGNOSIS — R55 Syncope and collapse: Secondary | ICD-10-CM | POA: Diagnosis not present

## 2016-07-20 DIAGNOSIS — J449 Chronic obstructive pulmonary disease, unspecified: Secondary | ICD-10-CM | POA: Diagnosis not present

## 2016-07-20 DIAGNOSIS — F17210 Nicotine dependence, cigarettes, uncomplicated: Secondary | ICD-10-CM | POA: Insufficient documentation

## 2016-07-20 DIAGNOSIS — I10 Essential (primary) hypertension: Secondary | ICD-10-CM | POA: Insufficient documentation

## 2016-07-20 DIAGNOSIS — Z853 Personal history of malignant neoplasm of breast: Secondary | ICD-10-CM | POA: Diagnosis not present

## 2016-07-20 DIAGNOSIS — Z7982 Long term (current) use of aspirin: Secondary | ICD-10-CM | POA: Diagnosis not present

## 2016-07-20 DIAGNOSIS — Z79899 Other long term (current) drug therapy: Secondary | ICD-10-CM | POA: Insufficient documentation

## 2016-07-20 DIAGNOSIS — Z8673 Personal history of transient ischemic attack (TIA), and cerebral infarction without residual deficits: Secondary | ICD-10-CM | POA: Diagnosis not present

## 2016-07-20 DIAGNOSIS — E119 Type 2 diabetes mellitus without complications: Secondary | ICD-10-CM | POA: Diagnosis not present

## 2016-07-20 LAB — COMPREHENSIVE METABOLIC PANEL
ALT: 25 U/L (ref 14–54)
AST: 30 U/L (ref 15–41)
Albumin: 3.6 g/dL (ref 3.5–5.0)
Alkaline Phosphatase: 89 U/L (ref 38–126)
Anion gap: 9 (ref 5–15)
BUN: 16 mg/dL (ref 6–20)
CHLORIDE: 106 mmol/L (ref 101–111)
CO2: 26 mmol/L (ref 22–32)
Calcium: 9 mg/dL (ref 8.9–10.3)
Creatinine, Ser: 1.01 mg/dL — ABNORMAL HIGH (ref 0.44–1.00)
GFR, EST AFRICAN AMERICAN: 58 mL/min — AB (ref >60)
GFR, EST NON AFRICAN AMERICAN: 50 mL/min — AB (ref >60)
Glucose, Bld: 137 mg/dL — ABNORMAL HIGH (ref 65–99)
POTASSIUM: 4.3 mmol/L (ref 3.5–5.1)
Sodium: 141 mmol/L (ref 135–145)
Total Bilirubin: 0.7 mg/dL (ref 0.3–1.2)
Total Protein: 6.6 g/dL (ref 6.5–8.1)

## 2016-07-20 LAB — I-STAT TROPONIN, ED: TROPONIN I, POC: 0 ng/mL (ref 0.00–0.08)

## 2016-07-20 LAB — CBC
HCT: 37.7 % (ref 36.0–46.0)
Hemoglobin: 12.5 g/dL (ref 12.0–15.0)
MCH: 30.9 pg (ref 26.0–34.0)
MCHC: 33.2 g/dL (ref 30.0–36.0)
MCV: 93.1 fL (ref 78.0–100.0)
PLATELETS: 226 10*3/uL (ref 150–400)
RBC: 4.05 MIL/uL (ref 3.87–5.11)
RDW: 14.1 % (ref 11.5–15.5)
WBC: 10.6 10*3/uL — AB (ref 4.0–10.5)

## 2016-07-20 NOTE — ED Triage Notes (Signed)
Pt was reportedly had 2 episodes of near syncope, one while out shopping and and another at home, first presented to an urgent care who refferred her to the ED. She is on anticoagulation, no reported fall.

## 2016-07-20 NOTE — ED Provider Notes (Signed)
[facility] DEPT Provider Note   CSN: [id number] Arrival date & time: 07/20/16  1546     History   Chief Complaint Chief Complaint  Patient presents with  . Near Syncope    HPI Crystal Osborne is a 83 y.o. female.  HPI   83 year old female with with history of breast cancer, AAA, COPD, CVA, thyroid disease presenting for evaluation of near syncope. Patient report for the past 2-3 days she has been feeling weak and tired. She feels as if she is going to pass out several times, today was the worst. Patient states she was out shopping with a friend when she had a near syncopal episode when she felt lightheadedness while standing. Bystander was able to guide her down to the ground. She went home and while sitting at the table family member noticed pt head went backward and she had a brief syncopal episode.  She denies injuring her self.  She denies fever, chills, headache, vision changes, URI sxs, neck pain, cp, sob, abd pain, heart palpitation, back pain, dysuria, focal numbness or focal weakness.  Denies recent medication changes however she believes her medicine may be causing her sxs, as she is on several BP medications as well as anticoagulants.  Son who is at bedside did report checking her BP PTA and it was low.  Pt sts she has been eating and drinking per usual, no other changes noted.    Past Medical History:  Diagnosis Date  . AAA (abdominal aortic aneurysm) [facility])    sees Dr Eden Lathe   . B12 deficiency   . Cancer of right breast ([place]) 1960  . CAROTID ARTERY DISEASE 11/28/2008   sees Dr Oneida Alar   . COPD (chronic obstructive pulmonary disease) ([place]) 01/25/2014  . CVA (cerebrovascular accident) ([place]) 2006  . Diabetes mellitus ([place]) 05/29/2010   A1c 6.0 on October 2011   . Hyperlipidemia   . Hypertension   . Osteoporosis 2003  . Pneumonia ~ 2014; ~ 2015  . Prolapse of female bladder, acquired   . Shingles    repeated episone 9/11 went to a UC  . TIA (transient ischemic attack)  ~ 2005  . Transient hypothyroidism    d/t thyroidectomy  . UTI (lower urinary tract infection) May 2013    Patient Active Problem List   Diagnosis Date Noted  . Claudication ([place]) 12/03/2015  . PCP NOTES >>>>> 01/10/2015  . COPD (chronic obstructive pulmonary disease) ([place]) 01/25/2014  . B12 deficiency 01/25/2014  . Mole of skin 07/26/2012  . Anxiety and depression 07/26/2012  . Rickettsioses 05/27/2012  . Insomnia 08/12/2011  . Annual physical exam 05/29/2010  . Prediabetes 05/29/2010  . Diarrhea, chronic , on-off 05/29/2010  . TOBACCO ABUSE 11/15/2009  . Disorder of bladder 09/25/2009  . SHOULDER PAIN 04/10/2009  . PERSONAL HX BREAST CANCER 12/12/2008  . Carotid artery disease ([place]) 11/28/2008  . PVD (peripheral vascular disease) ([place]) 04/18/2008  . ANEMIA, B12 DEFICIENCY 11/09/2007  . BLIND LOOP SYNDROME 11/09/2007  . DIVERTICULOSIS, COLON 10/14/2007  . COLONIC POLYPS 08/03/2006  . Essential hypertension 08/03/2006  . CVA 08/03/2006  . OSTEOPOROSIS NOS 08/03/2006  . THYROIDECTOMY, HX OF 08/03/2006  . Hyperlipidemia 03/05/2006  . Abdominal aortic aneurysm ([place]) 03/05/2006    Past Surgical History:  Procedure Laterality Date  . ABDOMINAL HYSTERECTOMY     no oophorectomy  . CATARACT EXTRACTION W/ INTRAOCULAR LENS  IMPLANT, BILATERAL Bilateral   . DILATION AND CURETTAGE OF UTERUS    . MASTECTOMY, RADICAL Right 1960s  remotely   . PERIPHERAL VASCULAR CATHETERIZATION  12/03/2015  . PERIPHERAL VASCULAR CATHETERIZATION N/A 12/03/2015   Procedure: Abdominal Aortogram;  Surgeon: Lorretta Harp, MD;  Location: [facility];  Service: Cardiovascular;  Laterality: N/A;  . PERIPHERAL VASCULAR CATHETERIZATION Bilateral 12/03/2015   Procedure: Lower Extremity Angiography;  Surgeon: Lorretta Harp, MD;  Location: [facility];  Service: Cardiovascular;  Laterality: Bilateral;  . PERIPHERAL VASCULAR CATHETERIZATION Bilateral 12/03/2015   Procedure: Peripheral  Vascular Intervention;  Surgeon: Lorretta Harp, MD;  Location: [facility];  Service: Cardiovascular;  Laterality: Bilateral;  46mmx58mm Lifestream bilateral Common Iliac Arteries  . PERIPHERAL VASCULAR CATHETERIZATION Bilateral 12/03/2015   Procedure: Peripheral Vascular Atherectomy;  Surgeon: Lorretta Harp, MD;  Location: [facility];  Service: Cardiovascular;  Laterality: Bilateral;  Common Iliac Arteries  . THYROIDECTOMY      OB History    No data available       Home Medications    Prior to Admission medications   Medication Sig Start Date End Date Taking? Authorizing Provider  acetaminophen (TYLENOL) 500 MG tablet Take 500 mg by mouth 2 (two) times daily as needed (for pain.).    [provider]  AMBULATORY NON FORMULARY MEDICATION Right Breast Prosthesis  Dx: V15.29 09/29/12   Colon Branch, MD  aspirin EC 81 MG tablet Take 81 mg by mouth every evening.    [provider]  atorvastatin (LIPITOR) 40 MG tablet Take 1 tablet (40 mg total) by mouth daily. 02/26/16   Colon Branch, MD  benazepril (LOTENSIN) 20 MG tablet Take 1 tablet (20 mg total) by mouth 2 (two) times daily. 05/06/16   Colon Branch, MD  clopidogrel (PLAVIX) 75 MG tablet Take 1 tablet (75 mg total) by mouth daily with breakfast. 12/04/15   Barrett, Evelene Croon, PA-C  felodipine (PLENDIL) 5 MG 24 hr tablet Take 1 tablet (5 mg total) by mouth daily. 04/22/16   Colon Branch, MD  vitamin B-12 (CYANOCOBALAMIN) 500 MCG tablet Take 500 mcg by mouth every evening.    [provider]    Family History Family History  Problem Relation Age of Onset  . Cancer Other        sister (lung) , brother (mouth)  . Colon cancer Neg Hx   . Coronary artery disease Neg Hx   . Diabetes type II Neg Hx   . Breast cancer Neg Hx     Social History Social History  Substance Use Topics  . Smoking status: Current Every Day Smoker    Packs/day: 0.50    Years: 67.00    Types: Cigarettes  . Smokeless  tobacco: Never Used  . Alcohol use 0.0 oz/week     Comment: 12/03/2015 "I've had a few mixed drinks in my lifetime; holidays"     Allergies   Ciprofloxacin hcl; Codeine; and Septra [sulfamethoxazole-trimethoprim]   Review of Systems Review of Systems  All other systems reviewed and are negative.    Physical Exam Updated Vital Signs There were no vitals taken for this visit.  Physical Exam  Constitutional: She is oriented to person, place, and time. She appears well-developed and well-nourished. No distress.  HENT:  Head: Atraumatic.  Mouth/Throat: Oropharynx is clear and moist.  Eyes: Conjunctivae and EOM are normal. Pupils are equal, round, and reactive to light.  Neck: Normal range of motion. Neck supple.  Cardiovascular: Normal rate and regular rhythm.   Pulmonary/Chest: Effort normal and breath sounds  normal.  Abdominal: Soft. Bowel sounds are normal. She exhibits no distension. There is no tenderness.  Neurological: She is alert and oriented to person, place, and time. She has normal strength. No cranial nerve deficit or sensory deficit. She displays a negative Romberg sign. Coordination normal. GCS eye subscore is 4. GCS verbal subscore is 5. GCS motor subscore is 6.  Skin: No rash noted.  Psychiatric: She has a normal mood and affect.  Nursing note and vitals reviewed.    ED Treatments / Results  Labs (all labs ordered are listed, but only abnormal results are displayed) Labs Reviewed  CBC - Abnormal; Notable for the following:       Result Value   WBC 10.6 (*)    All other components within normal limits  COMPREHENSIVE METABOLIC PANEL - Abnormal; Notable for the following:    Glucose, Bld 137 (*)    Creatinine, Ser 1.01 (*)    GFR calc non Af Amer 50 (*)    GFR calc Af Amer 58 (*)    All other components within normal limits  URINALYSIS, ROUTINE W REFLEX MICROSCOPIC  I-STAT TROPOININ, ED    EKG  EKG Interpretation  Date/Time:  Sunday July 20 2016  16:01:03 EDT Ventricular Rate:  67 PR Interval:    QRS Duration: 153 QT Interval:  452 QTC Calculation: 478 R Axis:   60 Text Interpretation:  Sinus rhythm Probable left atrial enlargement LVH with secondary repolarization abnormality Baseline wander in lead(s) II III aVF new inferolateral changes when compared to 02/2004 NO STEMI   Confirmed by Addison Lank [id number]) on 07/20/2016 5:31:24 PM     ED ECG REPORT   Radiology Ct Head Wo Contrast  Result Date: 07/20/2016 CLINICAL DATA:  Pt was reportedly had 2 episodes of near syncope, one while out shopping and and another at home, first presented to an urgent care who refferred her to the ED. She is on anticoagulation, no reported fall. EXAM: CT HEAD WITHOUT CONTRAST TECHNIQUE: Contiguous axial images were obtained from the base of the skull through the vertex without intravenous contrast. COMPARISON:  Brain MR 11/16/2005.  CT 02/17/2004. FINDINGS: Brain: Moderate low density in the periventricular white matter likely related to small vessel disease. This is progressive. Remote lacunar infarct in the left basal ganglia on image 15/series 2. No mass lesion, hemorrhage, hydrocephalus, acute infarct, intra-axial, or extra-axial fluid collection. Vascular: Intracranial atherosclerosis. Skull: Normal Sinuses/Orbits: Normal imaged portions of the orbits and globes. Clear paranasal sinuses and mastoid air cells. Other: None. IMPRESSION: 1.  No acute intracranial abnormality. 2. Progressive moderate small vessel ischemic change. Electronically Signed   By: Abigail Miyamoto M.D.   On: 07/20/2016 17:10    Procedures Procedures (including critical care time)  Medications Ordered in ED Medications - No data to display   Initial Impression / Assessment and Plan / ED Course  I have reviewed the triage vital signs and the nursing notes.  Pertinent labs & imaging results that were available during my care of the patient were reviewed by me and considered in my  medical decision making (see chart for details).     BP (!) 120/54 (BP Location: Left Arm)   Pulse 68   Temp 97.6 F (36.4 C) (Oral)   Resp 14   Ht 5\' 5"  (1.651 m)   Wt 49.9 kg (110 lb)   SpO2 96%   BMI 18.30 kg/m    Final Clinical Impressions(s) / ED Diagnoses   Final diagnoses:  Near  syncope    New Prescriptions New Prescriptions   No medications on file   4:14 PM Pt here with near syncope and report of lightheadedness x 2-3 days.  She has no focal neuro deficit on exam.  No CP/SOB.  Work up initiated.    5:21 PM EKG with flatten T waves in the inferior leads and Twaves inversion in the lateral leads when compare to prior EKG 10 yrs ago.  NO abnormal arrhythmia.  She denies CP.  Normal troponin. Normal WBC, normal H&H, no evidence of anemia, normal electrolytes panel and head CT scan without acute concerning feature. Patient has normal orthostatic vital sign.  Care discussed with Dr. Leonette Monarch  5:54 PM Pt felt better, requesting to be discharge.  I have sent a letter to her cardiologist Dr. Gwenlyn Found with request to have pt f/u in office for further evaulation of her near syncope.  She may benefit from Holter Monitoring or medication adjustment.  Return precaution given.  Pt stable for discharge.     Domenic Moras, PA-C 07/20/16 [place], [place], MD 07/21/16 [phone]

## 2016-07-20 NOTE — ED Notes (Signed)
Bed: WT09 Expected date:  Expected time:  Means of arrival:  Comments: Near Syncope

## 2016-07-20 NOTE — ED Notes (Signed)
Pt was able to ambulate normally

## 2016-07-20 NOTE — ED Notes (Signed)
Pt made aware of the need for urine sample.  

## 2016-07-20 NOTE — Discharge Instructions (Signed)
Please call and follow up with your heart doctor for further evaluation of your near passing out spell and low blood pressure. Return if your condition worsen or if you have other concerns.

## 2016-07-25 ENCOUNTER — Ambulatory Visit (INDEPENDENT_AMBULATORY_CARE_PROVIDER_SITE_OTHER): Payer: Medicare Other | Admitting: Cardiovascular Disease

## 2016-07-25 ENCOUNTER — Encounter: Payer: Self-pay | Admitting: Cardiovascular Disease

## 2016-07-25 VITALS — BP 127/61 | HR 73 | Ht 65.0 in | Wt 108.0 lb

## 2016-07-25 DIAGNOSIS — R55 Syncope and collapse: Secondary | ICD-10-CM

## 2016-07-25 DIAGNOSIS — I739 Peripheral vascular disease, unspecified: Principal | ICD-10-CM

## 2016-07-25 DIAGNOSIS — I779 Disorder of arteries and arterioles, unspecified: Secondary | ICD-10-CM

## 2016-07-25 MED ORDER — BENAZEPRIL HCL 10 MG PO TABS: 20.0000 mg | ORAL_TABLET | Freq: Two times a day (BID) | ORAL | 11 refills | Status: DC

## 2016-07-25 NOTE — Progress Notes (Signed)
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 She was referred by Dr. Larose Kells for peripheral vascular evaluation because of lifestyle limiting claudication. I last saw her in the office 06/27/16 . Her risk factor profile is notable for continued tobacco abuse of one half pack per day having smoked 35 pack years. She has treated hypertension and hyperlipidemia. There is a family history of heart disease. She has never had a heart attack but that has had a TIA remotely. She denies chest pain or shortness of breath. She's had bilateral lower extremity claudication right slightly worse than left which began 3 months ago. Dopplers performed in our office 10/22/15 revealed a right ABI of 0.68 and a left ABI 0.78. She did have high frequency signals in the origins of both iliac arteries. I performed angiography 12/03/15 revealing high-grade calcified ostial bilateral iliac artery stenoses with three-vessel runoff. I performed diamondback orbital rotational atherectomy, PTA and covered stenting using Lifestream cover stents of both iliac ostia. Her Dopplers improved and her claudication resolved. This has remained durable by Doppler studies today. She had an episode of presyncope on 07/20/16. Her workup was unremarkable. EKG showed no acute changes. She's had no subsequent episodes.   Current Outpatient Prescriptions  Medication Sig Dispense Refill  . acetaminophen (TYLENOL) 500 MG tablet Take 500 mg by mouth 2 (two) times daily as needed (for pain.).    Marland Kitchen AMBULATORY NON FORMULARY MEDICATION Right Breast Prosthesis  Dx: V15.29 1 Device 0  . aspirin EC 81 MG tablet Take 81 mg by mouth every evening.    Marland Kitchen atorvastatin (LIPITOR) 40 MG  tablet Take 1 tablet (40 mg total) by mouth daily. 90 tablet 1  . benazepril (LOTENSIN) 10 MG tablet Take 2 tablets (20 mg total) by mouth 2 (two) times daily. 60 tablet 11  . clopidogrel (PLAVIX) 75 MG tablet Take 1 tablet (75 mg total) by mouth daily with breakfast. 30 tablet 11  . felodipine (PLENDIL) 5 MG 24 hr tablet Take 1 tablet (5 mg total) by mouth daily. 90 tablet 2  . vitamin B-12 (CYANOCOBALAMIN) 500 MCG tablet Take 500 mcg by mouth every evening.     No current facility-administered medications for this visit.     Allergies  Allergen Reactions  . Ciprofloxacin Hcl Itching  . Codeine Rash    Made her feel very strange  . Septra [Sulfamethoxazole-Trimethoprim] Itching    Social History   Social History  . Marital status: Divorced    Spouse name: N/A  . Number of children: 3  . Years of education: N/A   Occupational History  . retired     Social History Main Topics  . Smoking status: Current Every Day Smoker    Packs/day: 0.50    Years: 67.00    Types: Cigarettes  . Smokeless tobacco: Never Used  . Alcohol use 0.0 oz/week     Comment: 12/03/2015 "I've had a few mixed drinks in my lifetime; holidays"  . Drug use: No  . Sexual activity: Not on file   Other Topics Concern  . Not on file   Social History Narrative   Lives by herself   Has 2 children in [place], 1 son in [place]  1 y/o son, disabled by stroke (09-2011)      Son w/ bone cancer    Still drives   Has a sister that lives in the [place] ( [place], [place] )--died 06-17-22             Review of Systems: General: negative for chills, fever, night sweats or weight changes.  Cardiovascular: negative for chest pain, dyspnea on exertion, edema, orthopnea, palpitations, paroxysmal nocturnal dyspnea or shortness of breath Dermatological: negative for rash Respiratory: negative for cough or wheezing Urologic: negative for hematuria Abdominal: negative for nausea, vomiting, diarrhea, bright red blood  per rectum, melena, or hematemesis Neurologic: negative for visual changes, syncope, or dizziness All other systems reviewed and are otherwise negative except as noted above.    Blood pressure 127/61, pulse 73, height 5\' 5"  (1.651 m), weight 108 lb (49 kg), SpO2 97 %.  General appearance: alert and no distress Neck: no adenopathy, no JVD, supple, symmetrical, trachea midline, thyroid not enlarged, symmetric, no tenderness/mass/nodules and Soft bilateral carotid bruits Lungs: clear to auscultation bilaterally Heart: regular rate and rhythm, S1, S2 normal, no murmur, click, rub or gallop Extremities: extremities normal, atraumatic, no cyanosis or edema  EKG not performed today. EKG performed 07/20/16 revealed sinus rhythm at 67 with inferolateral T-wave inversion. Personally reviewed that EKG.  ASSESSMENT AND PLAN:   Carotid artery disease History of carotid artery disease status post Doppler study performed October of last year revealing moderate bilateral ICA stenosis. She does have loud bruits bilaterally. I'm going to repeat carotid Doppler studies.  Syncope Crystal Osborne had an episode of loss of consciousness on 07/21/16 and was evaluated in the emergency room. Her workup was unremarkable. She had no prior episodes or subsequent episodes. An regular 2-D echocardiogram and a 30 day event monitor.      Lorretta Harp MD FACP,FACC,FAHA, [facility] 07/25/2016 3:46 PM

## 2016-07-25 NOTE — Patient Instructions (Signed)
Medication Instructions: Decrease Benazepril to 10 mg twice daily.   Testing/Procedures: Your physician has requested that you have an echocardiogram. Echocardiography is a painless test that uses sound waves to create images of your heart. It provides your doctor with information about the size and shape of your heart and how well your heart's chambers and valves are working. This procedure takes approximately one hour. There are no restrictions for this procedure.  Your physician has recommended that you wear a 30 day event monitor. Event monitors are medical devices that record the heart's electrical activity. Doctors most often [place] these monitors to diagnose arrhythmias. Arrhythmias are problems with the speed or rhythm of the heartbeat. The monitor is a small, portable device. You can wear one while you do your normal daily activities. This is usually used to diagnose what is causing palpitations/syncope (passing out).  Follow-Up: Your physician wants you to follow-up in: 1 year with Dr. Gwenlyn Found. You will receive a reminder letter in the mail two months in advance. If you don't receive a letter, please call our office to schedule the follow-up appointment.  If you need a refill on your cardiac medications before your next appointment, please call your pharmacy.

## 2016-07-25 NOTE — Assessment & Plan Note (Signed)
History of carotid artery disease status post Doppler study performed October of last year revealing moderate bilateral ICA stenosis. She does have loud bruits bilaterally. I'm going to repeat carotid Doppler studies.

## 2016-07-25 NOTE — Assessment & Plan Note (Signed)
Ms. Gloor had an episode of loss of consciousness on 07/21/16 and was evaluated in the emergency room. Her workup was unremarkable. She had no prior episodes or subsequent episodes. An regular 2-D echocardiogram and a 30 day event monitor.

## 2016-08-22 DIAGNOSIS — H1132 Conjunctival hemorrhage, left eye: Secondary | ICD-10-CM | POA: Diagnosis not present

## 2016-09-14 ENCOUNTER — Other Ambulatory Visit: Payer: Self-pay | Admitting: Internal Medicine

## 2016-09-25 ENCOUNTER — Other Ambulatory Visit: Payer: Self-pay

## 2016-09-25 ENCOUNTER — Ambulatory Visit (HOSPITAL_COMMUNITY): Payer: Medicare Other | Attending: Cardiology

## 2016-09-25 ENCOUNTER — Ambulatory Visit (INDEPENDENT_AMBULATORY_CARE_PROVIDER_SITE_OTHER): Payer: Medicare Other

## 2016-09-25 DIAGNOSIS — R55 Syncope and collapse: Secondary | ICD-10-CM

## 2016-09-25 DIAGNOSIS — I361 Nonrheumatic tricuspid (valve) insufficiency: Secondary | ICD-10-CM | POA: Diagnosis not present

## 2016-09-25 MED ORDER — PERFLUTREN LIPID MICROSPHERE
1.0000 mL | INTRAVENOUS | Status: AC | PRN
  Administered 2016-09-25: 2 mL via INTRAVENOUS

## 2016-09-26 ENCOUNTER — Encounter: Payer: Self-pay | Admitting: Internal Medicine

## 2016-09-26 ENCOUNTER — Ambulatory Visit (INDEPENDENT_AMBULATORY_CARE_PROVIDER_SITE_OTHER): Payer: Medicare Other | Admitting: Internal Medicine

## 2016-09-26 VITALS — BP 126/74 | HR 70 | Temp 98.1°F | Resp 14 | Ht 65.0 in | Wt 110.1 lb

## 2016-09-26 DIAGNOSIS — I10 Essential (primary) hypertension: Secondary | ICD-10-CM

## 2016-09-26 DIAGNOSIS — E785 Hyperlipidemia, unspecified: Secondary | ICD-10-CM | POA: Diagnosis not present

## 2016-09-26 DIAGNOSIS — M79604 Pain in right leg: Secondary | ICD-10-CM | POA: Diagnosis not present

## 2016-09-26 DIAGNOSIS — R55 Syncope and collapse: Secondary | ICD-10-CM | POA: Diagnosis not present

## 2016-09-26 DIAGNOSIS — R7303 Prediabetes: Secondary | ICD-10-CM | POA: Diagnosis not present

## 2016-09-26 MED ORDER — BENAZEPRIL HCL 10 MG PO TABS: 10.0000 mg | ORAL_TABLET | Freq: Two times a day (BID) | ORAL | 2 refills | Status: DC

## 2016-09-26 MED ORDER — CLOPIDOGREL BISULFATE 75 MG PO TABS: 75.0000 mg | ORAL_TABLET | Freq: Every day | ORAL | 2 refills | Status: DC

## 2016-09-26 MED ORDER — ATORVASTATIN CALCIUM 40 MG PO TABS: 40.0000 mg | ORAL_TABLET | Freq: Every day | ORAL | 2 refills | Status: DC

## 2016-09-26 NOTE — Progress Notes (Signed)
Subjective:    Patient ID: Crystal Osborne, female    DOB: 1931-11-04, 83 y.o.   MRN: [id number]  DOS:  09/26/2016 Type of visit - description :  Interval history:  Went to the ER 07/20/2016 w/ near syncope, workup negative including a head CT scan. Was released home. Saw cardiology subsequently, was Rx echo and a event monitor. ECHO per cardiology essentially normal, event monitor pending. No further events.  HTN: Benazepril dose was adjusted by cardiology. BP remains very good  Complains of pain at the right leg for 3 days: Started at the front of the knee and now is mostly at the posterior right thigh. Pain is triggered by getting up from the chair. No pain when sitting or walking. No recent fall or injury, no back or hip pain per se.   Review of Systems Denies chest or palpitations When she had a near syncope did not have any convulsion type of movements. Denies any headaches, diplopia, slurred speech or motor deficits. Occasional dizzy when she stands up.  Past Medical History:  Diagnosis Date  . AAA (abdominal aortic aneurysm) [facility])    sees Dr Eden Lathe   . B12 deficiency   . Cancer of right breast ([place]) 1960  . CAROTID ARTERY DISEASE 11/28/2008   sees Dr Oneida Alar   . COPD (chronic obstructive pulmonary disease) ([place]) 01/25/2014  . CVA (cerebrovascular accident) ([place]) 2006  . Diabetes mellitus ([place]) 05/29/2010   A1c 6.0 on October 2011   . Hyperlipidemia   . Hypertension   . Osteoporosis 2003  . Pneumonia ~ 2014; ~ 2015  . Prolapse of female bladder, acquired   . Shingles    repeated episone 9/11 went to a UC  . TIA (transient ischemic attack) ~ 2005  . Transient hypothyroidism    d/t thyroidectomy  . UTI (lower urinary tract infection) May 2013    Past Surgical History:  Procedure Laterality Date  . ABDOMINAL HYSTERECTOMY     no oophorectomy  . CATARACT EXTRACTION W/ INTRAOCULAR LENS  IMPLANT, BILATERAL Bilateral   . DILATION AND CURETTAGE OF UTERUS    .  MASTECTOMY, RADICAL Right 1960s   remotely   . PERIPHERAL VASCULAR CATHETERIZATION  12/03/2015  . PERIPHERAL VASCULAR CATHETERIZATION N/A 12/03/2015   Procedure: Abdominal Aortogram;  Surgeon: Lorretta Harp, MD;  Location: [place] [facility];  Service: Cardiovascular;  Laterality: N/A;  . PERIPHERAL VASCULAR CATHETERIZATION Bilateral 12/03/2015   Procedure: Lower Extremity Angiography;  Surgeon: Lorretta Harp, MD;  Location: [place] [facility];  Service: Cardiovascular;  Laterality: Bilateral;  . PERIPHERAL VASCULAR CATHETERIZATION Bilateral 12/03/2015   Procedure: Peripheral Vascular Intervention;  Surgeon: Lorretta Harp, MD;  Location: [place] [facility];  Service: Cardiovascular;  Laterality: Bilateral;  25mmx58mm Lifestream bilateral Common Iliac Arteries  . PERIPHERAL VASCULAR CATHETERIZATION Bilateral 12/03/2015   Procedure: Peripheral Vascular Atherectomy;  Surgeon: Lorretta Harp, MD;  Location: [place] [facility];  Service: Cardiovascular;  Laterality: Bilateral;  Common Iliac Arteries  . THYROIDECTOMY      Social History   Social History  . Marital status: Divorced    Spouse name: N/A  . Number of children: 3  . Years of education: N/A   Occupational History  . retired     Social History Main Topics  . Smoking status: Current Every Day Smoker    Packs/day: 0.50    Years: 67.00    Types: Cigarettes  . Smokeless tobacco: Never Used  . Alcohol use 0.0  oz/week     Comment: 12/03/2015 "I've had a few mixed drinks in my lifetime; holidays"  . Drug use: No  . Sexual activity: Not on file   Other Topics Concern  . Not on file   Social History Narrative   Lives by herself   Has 2 children in [place], 1 son in [place]   75 y/o son, disabled by stroke (09-2011)      Son w/ bone cancer    Still drives   Has a sister that lives in the [place], [place] ( [place], [place] )--died 06-25-22              Allergies as of 09/26/2016      Reactions   Ciprofloxacin Hcl Itching     Codeine Rash   Made her feel very strange   Septra [sulfamethoxazole-trimethoprim] Itching      Medication List       Accurate as of 09/26/16 11:59 PM. Always use your most recent med list.          acetaminophen 500 MG tablet Commonly known as:  TYLENOL Take 500 mg by mouth 2 (two) times daily as needed (for pain.).   AMBULATORY NON FORMULARY MEDICATION Right Breast Prosthesis  Dx: M57.84   aspirin EC 81 MG tablet Take 81 mg by mouth every evening.   atorvastatin 40 MG tablet Commonly known as:  LIPITOR Take 1 tablet (40 mg total) by mouth daily.   benazepril 10 MG tablet Commonly known as:  LOTENSIN Take 1 tablet (10 mg total) by mouth 2 (two) times daily.   clopidogrel 75 MG tablet Commonly known as:  PLAVIX Take 1 tablet (75 mg total) by mouth daily with breakfast.   felodipine 5 MG 24 hr tablet Commonly known as:  PLENDIL Take 1 tablet (5 mg total) by mouth daily.   vitamin B-12 500 MCG tablet Commonly known as:  CYANOCOBALAMIN Take 500 mcg by mouth every evening.          Objective:   Physical Exam BP 126/74 (BP Location: Left Arm, Patient Position: Sitting, Cuff Size: Normal)   Pulse 70   Temp 98.1 F (36.7 C) (Oral)   Resp 14   Ht 5\' 5"  (1.651 m)   Wt 110 lb 2 oz (50 kg)   SpO2 97%   BMI 18.33 kg/m  General:   Well developed, well nourished . NAD.  HEENT:  Normocephalic . Face symmetric, atraumatic Lungs:  CTA B Normal respiratory effort, no intercostal retractions, no accessory muscle use. Heart: RRR,  no murmur.  No pretibial edema bilaterally  Lower extremities: Symmetric, no edema. + Femoral pulses,+ pedal pulses. Toes with good capillary refill, warm, pink. Hip rotation: Normal bilaterally. Palpation of the hamstrings slightly TTP on the right but otherwise normal Skin: Not pale. Not jaundice Neurologic:  alert & oriented X3.  Speech normal, gait appropriate for age and unassisted. Somewhat antalgic due to right leg  pain. Psych--  Cognition and judgment appear intact.  Cooperative with normal attention span and concentration.  Behavior appropriate. No anxious or depressed appearing.      Assessment & Plan:   Assessment  Prediabetes HTN Hyperlipidemia Insomnia-- rarely has xanax  COPD: smoker, XR suggestive of dx, no PFTs Osteoporosis 2003-- refused dexas consistently  B12 def (185 05-2013) CV: ---CVA 2006 ---Carotid artery dz--last [place] 09-2011 40-59% B- ICA, declined further testing ---AAA --last [place]  05-2015, 1 year -- PVD, + ABIs 10-2015, 11-2015: B iliac stents Shingles 10-2009 and ~  07-2015 (seen elsewhere, L face) Status post thyroidectomy, transient hypothyroidism Breast cancer 1960 Allergies- hives sometimes , saw derm    PLAN  Prediabetes: A1c Stable over time HTN: Recently benazepril dose decreased to 10 mg twice a day. BP today is very good. Last BMP satisfactory. No change, are best meds Hyperlipidemia: Last LFT satisfactory, continue Lipitor. Recheck on RTC Near syncope: No further episodes, chart reviewed, [facility], echo essentially normal, event monitorpending. Leg pain: Likely a right hamstring strain. Doubt radiculopathy, no claudication on clinical grounds. Recommend Tylenol, warm compresses. Avoid NSAIDs. Call if not better. RTC 3-4 months, CPX. Time spent > 25 min , data from the ER eval  and cards visit reviewed

## 2016-09-26 NOTE — Patient Instructions (Signed)
  GO TO THE FRONT DESK Schedule your next appointment for a  physical exam in 4 months.  Also consider Medicare wellness with one of our nurses  Benazepril is 10 mg twice a day.   For the hamstrings strain: Tylenol as needed  Warm compress  Call if not improving

## 2016-09-26 NOTE — Progress Notes (Signed)
Pre visit review using our clinic review tool, if applicable. No additional management support is needed unless otherwise documented below in the visit note. 

## 2016-09-27 NOTE — Assessment & Plan Note (Signed)
Prediabetes: A1c Stable over time HTN: Recently benazepril dose decreased to 10 mg twice a day. BP today is very good. Last BMP satisfactory. No change, are best meds Hyperlipidemia: Last LFT satisfactory, continue Lipitor. Recheck on RTC Near syncope: No further episodes, chart reviewed, [place] cardiology, echo essentially normal, event monitorpending. Leg pain: Likely a right hamstring strain. Doubt radiculopathy, no claudication on clinical grounds. Recommend Tylenol, warm compresses. Avoid NSAIDs. Call if not better. RTC 3-4 months, CPX.

## 2016-09-29 ENCOUNTER — Telehealth: Payer: Self-pay | Admitting: Physician Assistant

## 2016-09-29 NOTE — Telephone Encounter (Signed)
Received phone call from patient's event monitoring company, [employer]. This was recently ordered due to syncope. [employer] received a strip triggered by the patient who indicated episode of syncope. Per [employer], strip showed NSR with 16 PVCs scattered throughout the strip without pattern (not consecutive). They have been unable to reach the patient by phone. I also tried the patient's home numbers/cell number which is the same as her emergency contact. She did not answer. I left message on both. Given that neither the monitor company nor myself were able to establish contact, have called [facility] to send medic out for safety check to patient's residence. Provided them with our office answering service number so they can call back and update [place] on findings.   Dayna Dunn PA-C

## 2016-09-30 ENCOUNTER — Telehealth: Payer: Self-pay

## 2016-09-30 NOTE — Telephone Encounter (Signed)
Received monitor strip from [employer] 09/29/16 at 11:30 am sinus rhythm rate 80.Patient symptom passed out.Patient stated she did not pass out, she pressed button by mistake.Stated she feels fine no complaints.

## 2016-09-30 NOTE — Telephone Encounter (Signed)
Dr. Aundra Dubin was fellow covering overnight, received f/u from EMS that pt refused to go to ER. Dayna Dunn PA-C

## 2016-10-29 ENCOUNTER — Telehealth: Payer: Self-pay | Admitting: Internal Medicine

## 2016-10-29 NOTE — Telephone Encounter (Signed)
Attempted to call pt to schedule AWV. Pt's phone was busy. Will try to call at a later time.

## 2016-12-23 ENCOUNTER — Telehealth: Payer: Self-pay | Admitting: Internal Medicine

## 2016-12-23 NOTE — Telephone Encounter (Signed)
Patient called and stts she is going on vacation and will be gone until 2019. Patient is requesting a refill on all RXS. Patient stts Pharmacy will not give her meds. Patient wants a call regarding this [phone].  Thanks

## 2016-12-24 ENCOUNTER — Other Ambulatory Visit: Payer: Self-pay | Admitting: Internal Medicine

## 2016-12-24 NOTE — Telephone Encounter (Signed)
Not sure why this was sent to me am routing to PCP's assistant

## 2016-12-24 NOTE — Telephone Encounter (Signed)
Spoke w/ Pt, recommended she speak w/ pharmacy regarding this, informed that she should have refills on file (prescriptions were refilled for 8 months back in August 2018). I did mention it could be that her insurance may not pay for an early supply. Pt verbalized understanding.

## 2017-03-09 ENCOUNTER — Encounter: Payer: Self-pay | Admitting: Internal Medicine

## 2017-03-13 NOTE — Progress Notes (Addendum)
Subjective:   Crystal Osborne is a 84 y.o. female who presents for Medicare Annual (Subsequent) preventive examination. The Patient was informed that the wellness visit is to identify future health risk and educate and initiate measures that can reduce risk for increased disease through the lifespan.   Describes health as fair, good or great? " above average"  Review of Systems: No ROS.  Medicare Wellness Visit. Additional risk factors are reflected in the social history. Cardiac Risk Factors include: advanced age (>78men, >47 women);dyslipidemia;hypertension Sleep patterns: Wakes often to urinate. Home Safety/Smoke Alarms: Feels safe in home. Smoke alarms in place.  Living environment; residence and Firearm Safety: Lives alone. No close family. Son in [place]. 2 story home. Doesn't use downstairs. Seat Belt Safety/Bike Helmet: Wears seat belt.   Female:      Mammo-  declines    Dexa scan- declines CCS- No longer doing routine screening due to age.    Objective:     Vitals: BP 120/72 (BP Location: Left Arm, Patient Position: Sitting, Cuff Size: Normal)   Pulse 70   Ht 5\' 5"  (1.651 m)   Wt 108 lb 9.6 oz (49.3 kg)   SpO2 97%   BMI 18.07 kg/m   Body mass index is 18.07 kg/m.  Advanced Directives 03/19/2017 07/20/2016 12/03/2015  Does Patient Have a Medical Advance Directive? Yes No Yes  Type of Paramedic of [place];Living will - [place];Living will  Does patient want to make changes to medical advance directive? - - No - Patient declined  Copy of [address] in Chart? No - copy requested - No - copy requested  Would patient like information on creating a medical advance directive? - No - Patient declined -    Tobacco Social History   Tobacco Use  Smoking Status Current Every Day Smoker  . Packs/day: 0.50  . Years: 67.00  . Pack years: 33.50  . Types: Cigarettes  Smokeless Tobacco Never Used     Ready to quit:  No Counseling given: No   Clinical Intake: Pain : No/denies pain    Past Medical History:  Diagnosis Date  . AAA (abdominal aortic aneurysm) [facility])    sees Dr Eden Lathe   . B12 deficiency   . Cancer of right breast ([place]) 1960  . CAROTID ARTERY DISEASE 11/28/2008   sees Dr Oneida Alar   . COPD (chronic obstructive pulmonary disease) ([place]) 01/25/2014  . CVA (cerebrovascular accident) ([place]) 2006  . Diabetes mellitus ([place]) 05/29/2010   A1c 6.0 on October 2011   . Hyperlipidemia   . Hypertension   . Osteoporosis 2003  . Pneumonia ~ 2014; ~ 2015  . Prolapse of female bladder, acquired   . Shingles    repeated episone 9/11 went to a UC  . TIA (transient ischemic attack) ~ 2005  . Transient hypothyroidism    d/t thyroidectomy  . UTI (lower urinary tract infection) May 2013   Past Surgical History:  Procedure Laterality Date  . ABDOMINAL HYSTERECTOMY     no oophorectomy  . CATARACT EXTRACTION W/ INTRAOCULAR LENS  IMPLANT, BILATERAL Bilateral   . DILATION AND CURETTAGE OF UTERUS    . MASTECTOMY, RADICAL Right 1960s   remotely   . PERIPHERAL VASCULAR CATHETERIZATION  12/03/2015  . PERIPHERAL VASCULAR CATHETERIZATION N/A 12/03/2015   Procedure: Abdominal Aortogram;  Surgeon: Lorretta Harp, MD;  Location: [place] [facility];  Service: Cardiovascular;  Laterality: N/A;  . PERIPHERAL VASCULAR CATHETERIZATION Bilateral 12/03/2015  Procedure: Lower Extremity Angiography;  Surgeon: Lorretta Harp, MD;  Location: [place] [facility];  Service: Cardiovascular;  Laterality: Bilateral;  . PERIPHERAL VASCULAR CATHETERIZATION Bilateral 12/03/2015   Procedure: Peripheral Vascular Intervention;  Surgeon: Lorretta Harp, MD;  Location: [place] [facility];  Service: Cardiovascular;  Laterality: Bilateral;  64mmx58mm Lifestream bilateral Common Iliac Arteries  . PERIPHERAL VASCULAR CATHETERIZATION Bilateral 12/03/2015   Procedure: Peripheral Vascular Atherectomy;  Surgeon: Lorretta Harp, MD;   Location: [place] [facility];  Service: Cardiovascular;  Laterality: Bilateral;  Common Iliac Arteries  . THYROIDECTOMY     Family History  Problem Relation Age of Onset  . Cancer Other        sister (lung) , brother (mouth)  . Colon cancer Neg Hx   . Coronary artery disease Neg Hx   . Diabetes type II Neg Hx   . Breast cancer Neg Hx    Social History   Socioeconomic History  . Marital status: Divorced    Spouse name: None  . Number of children: 3  . Years of education: None  . Highest education level: None  Social Needs  . Financial resource strain: None  . Food insecurity - worry: None  . Food insecurity - inability: None  . Transportation needs - medical: None  . Transportation needs - non-medical: None  Occupational History  . Occupation: retired   Tobacco Use  . Smoking status: Current Every Day Smoker    Packs/day: 0.50    Years: 67.00    Pack years: 33.50    Types: Cigarettes  . Smokeless tobacco: Never Used  Substance and Sexual Activity  . Alcohol use: No    Alcohol/week: 0.0 oz    Frequency: Never  . Drug use: No  . Sexual activity: No  Other Topics Concern  . None  Social History Narrative   Lives by herself   Has 2 children in [place], 1 son in [place]   38 y/o son, disabled by stroke (09-2011)      Son w/ bone cancer    Still drives   Has a sister that lives in the [place], [place], [place] )--died 2022/05/29            Outpatient Encounter Medications as of 03/19/2017  Medication Sig  . acetaminophen (TYLENOL) 500 MG tablet Take 500 mg by mouth 2 (two) times daily as needed (for pain.).  Marland Kitchen AMBULATORY NON FORMULARY MEDICATION Right Breast Prosthesis  Dx: K09.38  . aspirin EC 81 MG tablet Take 81 mg by mouth every evening.  Marland Kitchen atorvastatin (LIPITOR) 40 MG tablet Take 1 tablet (40 mg total) by mouth daily.  . benazepril (LOTENSIN) 10 MG tablet Take 1 tablet (10 mg total) by mouth 2 (two) times daily.  . clopidogrel (PLAVIX) 75 MG tablet Take 1 tablet  (75 mg total) by mouth daily with breakfast.  . felodipine (PLENDIL) 5 MG 24 hr tablet Take 1 tablet (5 mg total) daily by mouth.  . vitamin B-12 (CYANOCOBALAMIN) 500 MCG tablet Take 500 mcg by mouth every evening.   No facility-administered encounter medications on file as of 03/19/2017.     Activities of Daily Living In your present state of health, do you have any difficulty performing the following activities: 03/19/2017  Hearing? N  Vision? N  Comment wears reading glasses. hx cataract sx.  Difficulty concentrating or making decisions? N  Walking or climbing stairs? N  Comment uses cane occasionally.  Dressing or bathing? N  Doing errands, shopping? N  Preparing Food and eating ? N  Using the Toilet? N  In the past six months, have you accidently leaked urine? Y  Comment wears pads.  Do you have problems with loss of bowel control? Y  Comment IBS  Managing your Medications? N  Managing your Finances? N  Housekeeping or managing your Housekeeping? N  Some recent data might be hidden    Patient Care Team: Colon Branch, MD as PCP - General    Assessment:   This is a routine wellness examination for Cecillia. Physical assessment deferred to PCP.  Exercise Activities and Dietary recommendations Current Exercise Habits: The patient does not participate in regular exercise at present, Exercise limited by: None identified Diet (meal preparation, eat out, water intake, caffeinated beverages, dairy products, fruits and vegetables): well balanced, on average, 2 meals per day      Goals    . Start walking (pt-stated)       Fall Risk Fall Risk  03/19/2017 09/26/2016 05/18/2015 05/25/2014 09/06/2012  Falls in the past year? No No No No No    Depression Screen PHQ 2/9 Scores 03/19/2017 09/26/2016 05/18/2015 05/25/2014  PHQ - 2 Score 0 0 0 0  PHQ- 9 Score - - - -     Cognitive Function MMSE - Mini Mental State Exam 03/19/2017  Orientation to time 5  Orientation to Place 5  Registration 3    Attention/ Calculation 5  Recall 0  Language- name 2 objects 2  Language- repeat 1  Language- follow 3 step command 3  Language- read & follow direction 1  Write a sentence 1  Copy design 1  Total score 27        Immunization History  Administered Date(s) Administered  . Influenza Split 12/18/2010, 11/07/2013  . Influenza Whole 12/08/2006, 12/15/2007, 11/14/2008, 11/03/2012  . Influenza, High Dose Seasonal PF 11/06/2014, 10/29/2015  . Influenza-Unspecified 10/10/2016  . Pneumococcal Conjugate-13 09/18/2016  . Pneumococcal Polysaccharide-23 02/10/1998  . Td 02/10/1997, 04/18/2008  . Zoster 04/18/2008   Screening Tests Health Maintenance  Topic Date Due  . DEXA SCAN  07/07/1998  . PNA vac Low Risk Adult (2 of 2 - PPSV23) 09/18/2017  . TETANUS/TDAP  04/19/2018  . INFLUENZA VACCINE  Completed       Plan:   Follow up with PCP today as scheduled.  Continue to eat heart healthy diet (full of fruits, vegetables, whole grains, lean protein, water--limit salt, fat, and sugar intake) and increase physical activity as tolerated.  Continue doing brain stimulating activities (puzzles, reading, adult coloring books, staying active) to keep memory sharp.   Bring a copy of your living will and/or healthcare power of attorney to your next office visit.   I have personally reviewed and noted the following in the patient's chart:   . Medical and social history . Use of alcohol, tobacco or illicit drugs  . Current medications and supplements . Functional ability and status . Nutritional status . Physical activity . Advanced directives . List of other physicians . Hospitalizations, surgeries, and ER visits in previous 12 months . Vitals . Screenings to include cognitive, depression, and falls . Referrals and appointments  In addition, I have reviewed and discussed with patient certain preventive protocols, quality metrics, and best practice recommendations. A written  personalized care plan for preventive services as well as general preventive health recommendations were provided to patient.     Naaman Plummer [place], [place]  03/19/2017   Kathlene November, MD

## 2017-03-19 ENCOUNTER — Encounter: Payer: Self-pay | Admitting: Internal Medicine

## 2017-03-19 ENCOUNTER — Ambulatory Visit (INDEPENDENT_AMBULATORY_CARE_PROVIDER_SITE_OTHER): Payer: Medicare Other | Admitting: *Deleted

## 2017-03-19 ENCOUNTER — Ambulatory Visit (INDEPENDENT_AMBULATORY_CARE_PROVIDER_SITE_OTHER): Payer: Medicare Other | Admitting: Internal Medicine

## 2017-03-19 ENCOUNTER — Encounter: Payer: Self-pay | Admitting: *Deleted

## 2017-03-19 VITALS — BP 122/68 | HR 67 | Temp 97.9°F | Resp 16 | Wt 108.6 lb

## 2017-03-19 VITALS — BP 120/72 | HR 70 | Ht 65.0 in | Wt 108.6 lb

## 2017-03-19 DIAGNOSIS — Z0000 Encounter for general adult medical examination without abnormal findings: Secondary | ICD-10-CM

## 2017-03-19 DIAGNOSIS — J449 Chronic obstructive pulmonary disease, unspecified: Secondary | ICD-10-CM | POA: Diagnosis not present

## 2017-03-19 DIAGNOSIS — I714 Abdominal aortic aneurysm, without rupture, unspecified: Secondary | ICD-10-CM

## 2017-03-19 DIAGNOSIS — R7303 Prediabetes: Secondary | ICD-10-CM | POA: Diagnosis not present

## 2017-03-19 DIAGNOSIS — I159 Secondary hypertension, unspecified: Secondary | ICD-10-CM

## 2017-03-19 DIAGNOSIS — E538 Deficiency of other specified B group vitamins: Secondary | ICD-10-CM

## 2017-03-19 DIAGNOSIS — G4700 Insomnia, unspecified: Secondary | ICD-10-CM | POA: Diagnosis not present

## 2017-03-19 DIAGNOSIS — E785 Hyperlipidemia, unspecified: Secondary | ICD-10-CM

## 2017-03-19 LAB — COMPREHENSIVE METABOLIC PANEL
ALBUMIN: 3.6 g/dL (ref 3.5–5.2)
ALT: 10 U/L (ref 0–35)
AST: 16 U/L (ref 0–37)
Alkaline Phosphatase: 83 U/L (ref 39–117)
BILIRUBIN TOTAL: 0.6 mg/dL (ref 0.2–1.2)
BUN: 15 mg/dL (ref 6–23)
CALCIUM: 9.1 mg/dL (ref 8.4–10.5)
CO2: 27 meq/L (ref 19–32)
Chloride: 108 mEq/L (ref 96–112)
Creatinine, Ser: 0.89 mg/dL (ref 0.40–1.20)
GFR: 64.27 mL/min (ref >60.00)
Glucose, Bld: 93 mg/dL (ref 70–99)
Potassium: 4.7 mEq/L (ref 3.5–5.1)
Sodium: 140 mEq/L (ref 135–145)
Total Protein: 6.8 g/dL (ref 6.0–8.3)

## 2017-03-19 LAB — VITAMIN B12: VITAMIN B 12: 920 pg/mL — AB (ref 211–911)

## 2017-03-19 LAB — CBC
HCT: 38.5 % (ref 36.0–46.0)
Hemoglobin: 12.8 g/dL (ref 12.0–15.0)
MCHC: 33.3 g/dL (ref 30.0–36.0)
MCV: 92.9 fl (ref 78.0–100.0)
Platelets: 269 10*3/uL (ref 150.0–400.0)
RBC: 4.15 Mil/uL (ref 3.87–5.11)
RDW: 14.6 % (ref 11.5–15.5)
WBC: 8.5 10*3/uL (ref 4.0–10.5)

## 2017-03-19 LAB — LIPID PANEL
CHOL/HDL RATIO: 3
Cholesterol: 142 mg/dL (ref 0–200)
HDL: 46 mg/dL (ref >39.00)
LDL Cholesterol: 77 mg/dL (ref 0–99)
NonHDL: 96.21
TRIGLYCERIDES: 97 mg/dL (ref 0.0–149.0)
VLDL: 19.4 mg/dL (ref 0.0–40.0)

## 2017-03-19 LAB — TSH: TSH: 3.54 u[IU]/mL (ref 0.35–4.50)

## 2017-03-19 MED ORDER — ZOLPIDEM TARTRATE 5 MG PO TABS: 5.0000 mg | ORAL_TABLET | Freq: Every evening | ORAL | 1 refills | Status: DC | PRN

## 2017-03-19 MED ORDER — CLOPIDOGREL BISULFATE 75 MG PO TABS: 75.0000 mg | ORAL_TABLET | Freq: Every day | ORAL | 2 refills | Status: DC

## 2017-03-19 NOTE — Assessment & Plan Note (Addendum)
Prediabetes: Check A1c HTN: Reports good compliance with Lotensin and Plendil.  Ambulatory BP 120s.  Very rarely SBP in the 160s.  No change, check a CMP and CBC. Monitor amb BPs Hyperlipidemia: On Lipitor, check a FLP Insomnia: Occasionally difficulty sleeping, has some stress but denies anxiety or depression per se.  Rx melatonin OTC daily.  Okay Ambien 5 mg only if needed #30, no RF (patient reluctantly agreed to try, would prefer Xanax, will see how Ambien works for her) COPD, smoker: Not ready to quit, essentially no sxs. B12 deficiency: Takes 1000 mg daily po, checking labs Skin lesion: Looks like a SK, previously was seen by dermatology and they did not recommend any specific treatment.  Her neighbor will be able to check on the area, they will notify me if there is any changes. RTC 6 months

## 2017-03-19 NOTE — Progress Notes (Signed)
Subjective:    Patient ID: Crystal Osborne, female    DOB: October 21, 1931, 84 y.o.   MRN: [id number]  DOS:  03/19/2017 Type of visit - description : cpx  Interval history: Chronic medical issues also addressed.  BP Readings from Last 3 Encounters:  03/19/17 122/68  03/19/17 120/72  09/26/16 126/74   Wt Readings from Last 3 Encounters:  03/19/17 108 lb 9.6 oz (49.3 kg)  03/19/17 108 lb 9.6 oz (49.3 kg)  09/26/16 110 lb 2 oz (50 kg)     Review of Systems Has a mole on the back, recently the area has been itching, she is unable to see and would like me to check.  Has not seen any evidence of bleeding. Occasionally gets forgetful but no major more issues, still lives independently. In the last few days had some diarrhea, she think is due to IBS, denies fever, chills, stools color normal. From time to time she sees red blood in the toilet per, thinks related to hemorrhoids.  Other than above, a 14 point review of systems is negative    Past Medical History:  Diagnosis Date  . AAA (abdominal aortic aneurysm) [facility])    sees Dr Eden Lathe   . B12 deficiency   . Cancer of right breast ([place]) 1960  . CAROTID ARTERY DISEASE 11/28/2008   sees Dr Oneida Alar   . COPD (chronic obstructive pulmonary disease) ([place]) 01/25/2014  . CVA (cerebrovascular accident) ([place]) 2006  . Diabetes mellitus ([place]) 05/29/2010   A1c 6.0 on October 2011   . Hyperlipidemia   . Hypertension   . Osteoporosis 2003  . Pneumonia ~ 2014; ~ 2015  . Prolapse of female bladder, acquired   . Shingles    repeated episone 9/11 went to a UC  . TIA (transient ischemic attack) ~ 2005  . Transient hypothyroidism    d/t thyroidectomy  . UTI (lower urinary tract infection) May 2013    Past Surgical History:  Procedure Laterality Date  . ABDOMINAL HYSTERECTOMY     no oophorectomy  . CATARACT EXTRACTION W/ INTRAOCULAR LENS  IMPLANT, BILATERAL Bilateral   . DILATION AND CURETTAGE OF UTERUS    . MASTECTOMY, RADICAL Right 1960s   remotely   . PERIPHERAL VASCULAR CATHETERIZATION  12/03/2015  . PERIPHERAL VASCULAR CATHETERIZATION N/A 12/03/2015   Procedure: Abdominal Aortogram;  Surgeon: Lorretta Harp, MD;  Location: [facility];  Service: Cardiovascular;  Laterality: N/A;  . PERIPHERAL VASCULAR CATHETERIZATION Bilateral 12/03/2015   Procedure: Lower Extremity Angiography;  Surgeon: Lorretta Harp, MD;  Location: [facility];  Service: Cardiovascular;  Laterality: Bilateral;  . PERIPHERAL VASCULAR CATHETERIZATION Bilateral 12/03/2015   Procedure: Peripheral Vascular Intervention;  Surgeon: Lorretta Harp, MD;  Location: [place] [facility];  Service: Cardiovascular;  Laterality: Bilateral;  71mmx58mm Lifestream bilateral Common Iliac Arteries  . PERIPHERAL VASCULAR CATHETERIZATION Bilateral 12/03/2015   Procedure: Peripheral Vascular Atherectomy;  Surgeon: Lorretta Harp, MD;  Location: [place] [facility];  Service: Cardiovascular;  Laterality: Bilateral;  Common Iliac Arteries  . THYROIDECTOMY      Social History   Socioeconomic History  . Marital status: Divorced    Spouse name: Not on file  . Number of children: 3  . Years of education: Not on file  . Highest education level: Not on file  Social Needs  . Financial resource strain: Not on file  . Food insecurity - worry: Not on file  . Food insecurity - inability: Not on file  .  Transportation needs - medical: Not on file  . Transportation needs - non-medical: Not on file  Occupational History  . Occupation: retired   Tobacco Use  . Smoking status: Current Every Day Smoker    Packs/day: 0.50    Years: 67.00    Pack years: 33.50    Types: Cigarettes  . Smokeless tobacco: Never Used  . Tobacco comment: 1/2 ppd  Substance and Sexual Activity  . Alcohol use: No    Alcohol/week: 0.0 oz    Frequency: Never  . Drug use: No  . Sexual activity: No  Other Topics Concern  . Not on file  Social History Narrative   Lives by herself, no  family near by   Has 3 children, 2 living, not in good terms w/ one son, the other one is in [place]   Emergency contact: Burnett Sheng a son ~ 2017, he had  lung ca, stroke (09-2011)      Still drives   Lost a sister died 46-15             Family History  Problem Relation Age of Onset  . Cancer Other        sister (lung) , brother (mouth)  . Colon cancer Neg Hx   . Coronary artery disease Neg Hx   . Diabetes type II Neg Hx   . Breast cancer Neg Hx      Allergies as of 03/19/2017      Reactions   Ciprofloxacin Hcl Itching   Codeine Rash   Made her feel very strange   Septra [sulfamethoxazole-trimethoprim] Itching      Medication List        Accurate as of 03/19/17 10:04 PM. Always use your most recent med list.          acetaminophen 500 MG tablet Commonly known as:  TYLENOL Take 500 mg by mouth 2 (two) times daily as needed (for pain.).   AMBULATORY NON FORMULARY MEDICATION Right Breast Prosthesis  Dx: W11.91   aspirin EC 81 MG tablet Take 81 mg by mouth every evening.   atorvastatin 40 MG tablet Commonly known as:  LIPITOR Take 1 tablet (40 mg total) by mouth daily.   benazepril 10 MG tablet Commonly known as:  LOTENSIN Take 1 tablet (10 mg total) by mouth 2 (two) times daily.   clopidogrel 75 MG tablet Commonly known as:  PLAVIX Take 1 tablet (75 mg total) by mouth daily with breakfast.   felodipine 5 MG 24 hr tablet Commonly known as:  PLENDIL Take 1 tablet (5 mg total) daily by mouth.   VITAMIN B 12 PO Take 1,000 mg by mouth daily.   zolpidem 5 MG tablet Commonly known as:  AMBIEN Take 1 tablet (5 mg total) by mouth at bedtime as needed for sleep.          Objective:   Physical Exam  Skin:      BP 122/68 (BP Location: Left Arm, Patient Position: Sitting, Cuff Size: Small)   Pulse 67   Temp 97.9 F (36.6 C) (Oral)   Resp 16   Wt 108 lb 9.6 oz (49.3 kg)   SpO2 97%   BMI 18.07 kg/m  General:   Well developed, elderly lady, slightly  underweight appearing, NAD.  Neck: No  thyromegaly  HEENT:  Normocephalic . Face symmetric, atraumatic Lungs:  CTA B Normal respiratory effort, no intercostal retractions, no accessory muscle use. Heart: RRR,  no murmur.  No pretibial  edema bilaterally  Abdomen:  Not distended, soft, non-tender. No rebound or rigidity.   Neurologic:  alert & oriented X3.  Speech normal, gait appropriate for age and unassisted Strength symmetric and appropriate for age.  Psych: Cognition and judgment appear intact.  Cooperative with normal attention span and concentration.  Behavior appropriate. No anxious or depressed appearing.     Assessment & Plan:    Assessment  Prediabetes HTN Hyperlipidemia Insomnia-- rarely has xanax  COPD: smoker, XR suggestive of dx, no PFTs Osteoporosis 2003-- refused dexas consistently  B12 def (185 05-2013) CV: ---CVA 2006 ---Carotid artery dz--last [place] 09-2011 40-59% B- ICA, declined further testing ---AAA --last [place]  05-2015, 1 year -- PVD, + ABIs 10-2015, 11-2015: B iliac stents Shingles 10-2009 and ~ 07-2015 (seen elsewhere, L face) Status post thyroidectomy, transient hypothyroidism Breast cancer 1960 Allergies- hives sometimes , saw derm    PLAN  Prediabetes: Check A1c HTN: Reports good compliance with Lotensin and Plendil.  Ambulatory BP 120s.  Very rarely SBP in the 160s.  No change, check a CMP and CBC. Monitor amb BPs Hyperlipidemia: On Lipitor, check a FLP Insomnia: Occasionally difficulty sleeping, has some stress but denies anxiety or depression per se.  Rx melatonin OTC daily.  Okay Ambien 5 mg only if needed #30, no RF (patient reluctantly agreed to try, would prefer Xanax, will see how Ambien works for her) COPD, smoker: Not ready to quit, essentially no sxs. B12 deficiency: Takes 1000 mg daily po, checking labs Skin lesion: Looks like a SK, previously was seen by dermatology and they did not recommend any specific treatment.  Her neighbor will  be able to check on the area, they will notify me if there is any changes. RTC 6 months

## 2017-03-19 NOTE — Patient Instructions (Signed)
Continue to eat heart healthy diet (full of fruits, vegetables, whole grains, lean protein, water--limit salt, fat, and sugar intake) and increase physical activity as tolerated.  Continue doing brain stimulating activities (puzzles, reading, adult coloring books, staying active) to keep memory sharp.   Bring a copy of your living will and/or healthcare power of attorney to your next office visit.   Crystal Osborne , Thank you for taking time to come for your Medicare Wellness Visit. I appreciate your ongoing commitment to your health goals. Please review the following plan we discussed and let me know if I can assist you in the future.   These are the goals we discussed: Goals    . Start walking (pt-stated)       This is a list of the screening recommended for you and due dates:  Health Maintenance  Topic Date Due  . DEXA scan (bone density measurement)  07/07/1998  . Pneumonia vaccines (2 of 2 - PPSV23) 09/18/2017  . Tetanus Vaccine  04/19/2018  . Flu Shot  Completed    Health Maintenance for Postmenopausal Women Menopause is a normal process in which your reproductive ability comes to an end. This process happens gradually over a span of months to years, usually between the ages of 11 and 53. Menopause is complete when you have missed 12 consecutive menstrual periods. It is important to talk with your health care provider about some of the most common conditions that affect postmenopausal women, such as heart disease, cancer, and bone loss (osteoporosis). Adopting a healthy lifestyle and getting preventive care can help to promote your health and wellness. Those actions can also lower your chances of developing some of these common conditions. What should I know about menopause? During menopause, you may experience a number of symptoms, such as:  Moderate-to-severe hot flashes.  Night sweats.  Decrease in sex drive.  Mood  swings.  Headaches.  Tiredness.  Irritability.  Memory problems.  Insomnia.  Choosing to treat or not to treat menopausal changes is an individual decision that you make with your health care provider. What should I know about hormone replacement therapy and supplements? Hormone therapy products are effective for treating symptoms that are associated with menopause, such as hot flashes and night sweats. Hormone replacement carries certain risks, especially as you become older. If you are thinking about using estrogen or estrogen with progestin treatments, discuss the benefits and risks with your health care provider. What should I know about heart disease and stroke? Heart disease, heart attack, and stroke become more likely as you age. This may be due, in part, to the hormonal changes that your body experiences during menopause. These can affect how your body processes dietary fats, triglycerides, and cholesterol. Heart attack and stroke are both medical emergencies. There are many things that you can do to help prevent heart disease and stroke:  Have your blood pressure checked at least every 1-2 years. High blood pressure causes heart disease and increases the risk of stroke.  If you are 21-44 years old, ask your health care provider if you should take aspirin to prevent a heart attack or a stroke.  Do not use any tobacco products, including cigarettes, chewing tobacco, or electronic cigarettes. If you need help quitting, ask your health care provider.  It is important to eat a healthy diet and maintain a healthy weight. ? Be sure to include plenty of vegetables, fruits, low-fat dairy products, and lean protein. ? Avoid eating foods that are high  solid fats, added sugars, or salt (sodium).  Get regular exercise. This is one of the most important things that you can do for your health. ? Try to exercise for at least 150 minutes each week. The type of exercise that you do should  increase your heart rate and make you sweat. This is known as moderate-intensity exercise. ? Try to do strengthening exercises at least twice each week. Do these in addition to the moderate-intensity exercise.  Know your numbers.Ask your health care provider to check your cholesterol and your blood glucose. Continue to have your blood tested as directed by your health care provider.  What should I know about cancer screening? There are several types of cancer. Take the following steps to reduce your risk and to catch any cancer development as early as possible. Breast Cancer  Practice breast self-awareness. ? This means understanding how your breasts normally appear and feel. ? It also means doing regular breast self-exams. Let your health care provider know about any changes, no matter how small.  If you are 40 or older, have a clinician do a breast exam (clinical breast exam or CBE) every year. Depending on your age, family history, and medical history, it may be recommended that you also have a yearly breast X-ray (mammogram).  If you have a family history of breast cancer, talk with your health care provider about genetic screening.  If you are at high risk for breast cancer, talk with your health care provider about having an MRI and a mammogram every year.  Breast cancer (BRCA) gene test is recommended for women who have family members with BRCA-related cancers. Results of the assessment will determine the need for genetic counseling and BRCA1 and for BRCA2 testing. BRCA-related cancers include these types: ? Breast. This occurs in males or females. ? Ovarian. ? Tubal. This may also be called fallopian tube cancer. ? Cancer of the abdominal or pelvic lining (peritoneal cancer). ? Prostate. ? Pancreatic.  Cervical, Uterine, and Ovarian Cancer Your health care provider may recommend that you be screened regularly for cancer of the pelvic organs. These include your ovaries, uterus,  and vagina. This screening involves a pelvic exam, which includes checking for microscopic changes to the surface of your cervix (Pap test).  For women ages 21-65, health care providers may recommend a pelvic exam and a Pap test every three years. For women ages 30-65, they may recommend the Pap test and pelvic exam, combined with testing for human papilloma virus (HPV), every five years. Some types of HPV increase your risk of cervical cancer. Testing for HPV may also be done on women of any age who have unclear Pap test results.  Other health care providers may not recommend any screening for nonpregnant women who are considered low risk for pelvic cancer and have no symptoms. Ask your health care provider if a screening pelvic exam is right for you.  If you have had past treatment for cervical cancer or a condition that could lead to cancer, you need Pap tests and screening for cancer for at least 20 years after your treatment. If Pap tests have been discontinued for you, your risk factors (such as having a new sexual partner) need to be reassessed to determine if you should start having screenings again. Some women have medical problems that increase the chance of getting cervical cancer. In these cases, your health care provider may recommend that you have screening and Pap tests more often.  If you have   a family history of uterine cancer or ovarian cancer, talk with your health care provider about genetic screening.  If you have vaginal bleeding after reaching menopause, tell your health care provider.  There are currently no reliable tests available to screen for ovarian cancer.  Lung Cancer Lung cancer screening is recommended for adults 55-80 years old who are at high risk for lung cancer because of a history of smoking. A yearly low-dose CT scan of the lungs is recommended if you:  Currently smoke.  Have a history of at least 30 pack-years of smoking and you currently smoke or have quit  within the past 15 years. A pack-year is smoking an average of one pack of cigarettes per day for one year.  Yearly screening should:  Continue until it has been 15 years since you quit.  Stop if you develop a health problem that would prevent you from having lung cancer treatment.  Colorectal Cancer  This type of cancer can be detected and can often be prevented.  Routine colorectal cancer screening usually begins at age 50 and continues through age 75.  If you have risk factors for colon cancer, your health care provider may recommend that you be screened at an earlier age.  If you have a family history of colorectal cancer, talk with your health care provider about genetic screening.  Your health care provider may also recommend using home test kits to check for hidden blood in your stool.  A small camera at the end of a tube can be used to examine your colon directly (sigmoidoscopy or colonoscopy). This is done to check for the earliest forms of colorectal cancer.  Direct examination of the colon should be repeated every 5-10 years until age 75. However, if early forms of precancerous polyps or small growths are found or if you have a family history or genetic risk for colorectal cancer, you may need to be screened more often.  Skin Cancer  Check your skin from head to toe regularly.  Monitor any moles. Be sure to tell your health care provider: ? About any new moles or changes in moles, especially if there is a change in a mole's shape or color. ? If you have a mole that is larger than the size of a pencil eraser.  If any of your family members has a history of skin cancer, especially at a young age, talk with your health care provider about genetic screening.  Always use sunscreen. Apply sunscreen liberally and repeatedly throughout the day.  Whenever you are outside, protect yourself by wearing long sleeves, pants, a wide-brimmed hat, and sunglasses.  What should I know  about osteoporosis? Osteoporosis is a condition in which bone destruction happens more quickly than new bone creation. After menopause, you may be at an increased risk for osteoporosis. To help prevent osteoporosis or the bone fractures that can happen because of osteoporosis, the following is recommended:  If you are 19-50 years old, get at least 1,000 mg of calcium and at least 600 mg of vitamin D per day.  If you are older than age 50 but younger than age 70, get at least 1,200 mg of calcium and at least 600 mg of vitamin D per day.  If you are older than age 70, get at least 1,200 mg of calcium and at least 800 mg of vitamin D per day.  Smoking and excessive alcohol intake increase the risk of osteoporosis. Eat foods that are rich in calcium and   and vitamin D, and do weight-bearing exercises several times each week as directed by your health care provider. What should I know about how menopause affects my mental health? Depression may occur at any age, but it is more common as you become older. Common symptoms of depression include:  Low or sad mood.  Changes in sleep patterns.  Changes in appetite or eating patterns.  Feeling an overall lack of motivation or enjoyment of activities that you previously enjoyed.  Frequent crying spells.  Talk with your health care provider if you think that you are experiencing depression. What should I know about immunizations? It is important that you get and maintain your immunizations. These include:  Tetanus, diphtheria, and pertussis (Tdap) booster vaccine.  Influenza every year before the flu season begins.  Pneumonia vaccine.  Shingles vaccine.  Your health care provider may also recommend other immunizations. This information is not intended to replace advice given to you by your health care provider. Make sure you discuss any questions you have with your health care provider. Document Released: 03/21/2005 Document Revised: 08/17/2015  Document Reviewed: 10/31/2014 Elsevier Interactive Patient Education  2018 [employer].

## 2017-03-19 NOTE — Patient Instructions (Addendum)
GO TO THE LAB : Get the blood work     GO TO THE FRONT DESK Schedule your next appointment for a routine checkup in 6 months    Check the  blood pressure 2 or 3 times a month   Be sure your blood pressure is between 110/65 and  135/85. If it is consistently higher or lower, let me know   For difficulty sleeping Melatonin OTC daily Also okay to take Ambien 5 mg 1 tablet at night only if needed.

## 2017-03-19 NOTE — Assessment & Plan Note (Addendum)
--  Td  3-10; pneumonia shot 02-2007, prevnar 09-2016; zostavax 2010; had a flu shot  -CCS:  Cscope 12-06- hyperplastic polyp, sigmoidoscopy 11-2008- bx benign mucosa, Dr. Sharlett Iles -female care: refuses MMG; PAPs no longer indicated   -Tobacco: Not ready to quit -Diet and exercise recommended

## 2017-03-23 ENCOUNTER — Ambulatory Visit: Payer: Medicare Other | Admitting: Internal Medicine

## 2017-07-28 ENCOUNTER — Ambulatory Visit (INDEPENDENT_AMBULATORY_CARE_PROVIDER_SITE_OTHER): Payer: Medicare Other | Admitting: Internal Medicine

## 2017-07-28 ENCOUNTER — Encounter: Payer: Self-pay | Admitting: Cardiovascular Disease

## 2017-07-28 ENCOUNTER — Ambulatory Visit: Payer: Medicare Other | Admitting: Cardiovascular Disease

## 2017-07-28 ENCOUNTER — Encounter: Payer: Self-pay | Admitting: Internal Medicine

## 2017-07-28 VITALS — BP 142/84 | HR 72 | Temp 97.8°F | Resp 16 | Ht 65.0 in | Wt 113.0 lb

## 2017-07-28 VITALS — BP 174/78 | HR 76 | Ht 65.0 in | Wt 113.0 lb

## 2017-07-28 DIAGNOSIS — I714 Abdominal aortic aneurysm, without rupture, unspecified: Secondary | ICD-10-CM

## 2017-07-28 DIAGNOSIS — I739 Peripheral vascular disease, unspecified: Secondary | ICD-10-CM | POA: Diagnosis not present

## 2017-07-28 DIAGNOSIS — I6523 Occlusion and stenosis of bilateral carotid arteries: Secondary | ICD-10-CM

## 2017-07-28 DIAGNOSIS — B029 Zoster without complications: Secondary | ICD-10-CM

## 2017-07-28 DIAGNOSIS — I10 Essential (primary) hypertension: Secondary | ICD-10-CM

## 2017-07-28 DIAGNOSIS — F5101 Primary insomnia: Secondary | ICD-10-CM

## 2017-07-28 MED ORDER — ALPRAZOLAM 0.25 MG PO TABS: 0.2500 mg | ORAL_TABLET | Freq: Every evening | ORAL | 0 refills | Status: DC | PRN

## 2017-07-28 MED ORDER — VALACYCLOVIR HCL 500 MG PO TABS: 500.0000 mg | ORAL_TABLET | Freq: Three times a day (TID) | ORAL | 0 refills | Status: DC

## 2017-07-28 MED FILL — ALPRAZolam 0.25 MG TABS: 0.25 | 30 days supply | Qty: 60 | Fill #0

## 2017-07-28 MED FILL — valACYclovir HCL 1 GM TABS: 1 | 7 days supply | Qty: 11 | Fill #0

## 2017-07-28 NOTE — Patient Instructions (Signed)
Take Valtrex as prescribed

## 2017-07-28 NOTE — Progress Notes (Signed)
07/28/2017 SHAELIN LALLEY   1931/10/26  [id number]  Primary Physician Colon Branch, MD Primary Cardiologist: Lorretta Harp MD Lupe Carney, [place]  HPI:  Crystal Osborne is a 84 y.o.  divorced Caucasian female mother, grandmother and 4 grandchildren whose retired from working as a Freight forwarder at [employer]. She was referred by Dr. Larose Kells for peripheral vascular evaluation because of lifestyle limiting claudication.I last saw her in the office  07/25/2016 .Her risk factor profile is notable for continued tobacco abuse of one half pack per day having smoked 35 pack years. She has treated hypertension and hyperlipidemia. There is a family history of heart disease. She has never had a heart attack but that has had a TIA remotely. She denies chest pain or shortness of breath. She's had bilateral lower extremity claudication right slightly worse than left which began 3 months ago. Dopplers performed in our office 10/22/15 revealed a right ABI of 0.68 and a left ABI 0.78. She did have high frequency signals in the origins of both iliac arteries. I performed angiography 12/03/15 revealing high-grade calcified ostial bilateral iliac artery stenoses with three-vessel runoff. I performed diamondback orbital rotational atherectomy, PTA and covered stenting using Lifestream cover stents of both iliac ostia. Her Dopplers improved and her claudication resolved. This has remained durable by Doppler studies today. She had an episode of presyncope on 07/20/16. Her workup was unremarkable. EKG showed no acute changes. She's had no subsequent episodes. Since I saw her in the office a year ago she is remained stable.  He continues to deny claudication.  She continues to smoke.  She does have shingles on her left neck which is painful today.     Current Meds  Medication Sig  . acetaminophen (TYLENOL) 500 MG tablet Take 500 mg by mouth 2 (two) times daily as needed (for pain.).  Marland Kitchen AMBULATORY NON FORMULARY  MEDICATION Right Breast Prosthesis  Dx: R15.40  . aspirin EC 81 MG tablet Take 81 mg by mouth every evening.  Marland Kitchen atorvastatin (LIPITOR) 40 MG tablet Take 1 tablet (40 mg total) by mouth daily.  . benazepril (LOTENSIN) 10 MG tablet Take 1 tablet (10 mg total) by mouth 2 (two) times daily.  . clopidogrel (PLAVIX) 75 MG tablet Take 1 tablet (75 mg total) by mouth daily with breakfast.  . Cyanocobalamin (VITAMIN B 12 PO) Take 1,000 mg by mouth daily.  . felodipine (PLENDIL) 5 MG 24 hr tablet Take 1 tablet (5 mg total) daily by mouth.     Allergies  Allergen Reactions  . Septra [Sulfamethoxazole-Trimethoprim] Itching    Pt states she had a severe allergic reaction; she was in the ICU for 2 days because of taking this.  . Ciprofloxacin Hcl Itching  . Codeine Rash    Made her feel very strange    Social History   Socioeconomic History  . Marital status: Divorced    Spouse name: Not on file  . Number of children: 3  . Years of education: Not on file  . Highest education level: Not on file  Occupational History  . Occupation: retired   Scientific laboratory technician  . Financial resource strain: Not on file  . Food insecurity:    Worry: Not on file    Inability: Not on file  . Transportation needs:    Medical: Not on file    Non-medical: Not on file  Tobacco Use  . Smoking status: Current Every Day Smoker    Packs/day:  0.50    Years: 67.00    Pack years: 33.50    Types: Cigarettes  . Smokeless tobacco: Never Used  . Tobacco comment: 1/2 ppd  Substance and Sexual Activity  . Alcohol use: No    Alcohol/week: 0.0 oz    Frequency: Never  . Drug use: No  . Sexual activity: Never  Lifestyle  . Physical activity:    Days per week: Not on file    Minutes per session: Not on file  . Stress: Not on file  Relationships  . Social connections:    Talks on phone: Not on file    Gets together: Not on file    Attends religious service: Not on file    Active member of club or organization: Not on file     Attends meetings of clubs or organizations: Not on file    Relationship status: Not on file  . Intimate partner violence:    Fear of current or ex partner: Not on file    Emotionally abused: Not on file    Physically abused: Not on file    Forced sexual activity: Not on file  Other Topics Concern  . Not on file  Social History Narrative   Lives by herself, no family near by   Has 3 children, 2 living, not in good terms w/ one son, the other one is in [place]   Emergency contact: Burnett Sheng a son ~ 2017, he had  lung ca, stroke (09-2011)      Still drives   Lost a sister died 51-15             Review of Systems: General: negative for chills, fever, night sweats or weight changes.  Cardiovascular: negative for chest pain, dyspnea on exertion, edema, orthopnea, palpitations, paroxysmal nocturnal dyspnea or shortness of breath Dermatological: negative for rash Respiratory: negative for cough or wheezing Urologic: negative for hematuria Abdominal: negative for nausea, vomiting, diarrhea, bright red blood per rectum, melena, or hematemesis Neurologic: negative for visual changes, syncope, or dizziness All other systems reviewed and are otherwise negative except as noted above.    Blood pressure (!) 174/78, pulse 76, height 5\' 5"  (1.651 m), weight 113 lb (51.3 kg).  General appearance: alert and no distress Neck: no adenopathy, no carotid bruit, no JVD, supple, symmetrical, trachea midline and thyroid not enlarged, symmetric, no tenderness/mass/nodules Lungs: clear to auscultation bilaterally Heart: regular rate and rhythm, S1, S2 normal, no murmur, click, rub or gallop Extremities: extremities normal, atraumatic, no cyanosis or edema Pulses: 2+ and symmetric Skin: Skin color, texture, turgor normal. No rashes or lesions Neurologic: Alert and oriented X 3, normal strength and tone. Normal symmetric reflexes. Normal coordination and gait  EKG sinus rhythm at 76 with left ventricular  hypertrophy and repolarization changes.  I personally reviewed this EKG.  ASSESSMENT AND PLAN:   Hyperlipidemia She of hyperlipidemia on statin therapy with recent lipid profile performed 03/19/2017 revealing total cholesterol 142, LDL 77 and HDL 46.  TOBACCO ABUSE History of continued tobacco abuse of three quarters a pack a day recalcitrant to risk factor modification.  Essential hypertension History of essential hypertension her blood pressure measured at 174/78.  She is on Lotensin and Plendil I believe her blood pressure is elevated because she is in pain from shingles.  PVD (peripheral vascular disease) ([place]) History of peripheral arterial disease status post bilateral iliac diamondback orbital rotational atherectomy followed by covered stenting 12/03/2015 for lifestyle limiting claudication.  Her  most recent Dopplers performed 06/27/2016 revealed a right ABI 0.94 and a left ABI 0.86 with a high-frequency signal in her right common iliac artery although she denies claudication.  We will repeat aortoiliac and lower extremity arterial Doppler studies.      Lorretta Harp MD FACP,FACC,FAHA, [facility] 07/28/2017 11:08 AM

## 2017-07-28 NOTE — Assessment & Plan Note (Signed)
History of continued tobacco abuse of three quarters a pack a day recalcitrant to risk factor modification.

## 2017-07-28 NOTE — Progress Notes (Signed)
Subjective:    Patient ID: Crystal Osborne, female    DOB: Oct 15, 1931, 84 y.o.   MRN: [id number]  DOS:  07/28/2017 Type of visit - description : Acute Interval history: Was seen by cardiology today, Dr. Gwenlyn Found, was recommended to come to this office due to a rash. The rashes started about 5 days ago with a single blister at the left shoulder, reports that every day since then, she got a new blister, they are drying out, the pain is described an intense burning and itching is located to the neck and shoulder.  Worse when her clothes touch the area. Cardiology note reviewed.   Review of Systems Denies Fever or chills   Past Medical History:  Diagnosis Date  . AAA (abdominal aortic aneurysm) [facility])    sees Dr Eden Lathe   . B12 deficiency   . Cancer of right breast ([place]) 1960  . CAROTID ARTERY DISEASE 11/28/2008   sees Dr Oneida Alar   . COPD (chronic obstructive pulmonary disease) ([place]) 01/25/2014  . CVA (cerebrovascular accident) ([place]) 2006  . Diabetes mellitus ([place]) 05/29/2010   A1c 6.0 on October 2011   . Hyperlipidemia   . Hypertension   . Osteoporosis 2003  . Pneumonia ~ 2014; ~ 2015  . Prolapse of female bladder, acquired   . Shingles    repeated episone 9/11 went to a UC  . TIA (transient ischemic attack) ~ 2005  . Transient hypothyroidism    d/t thyroidectomy  . UTI (lower urinary tract infection) May 2013    Past Surgical History:  Procedure Laterality Date  . ABDOMINAL HYSTERECTOMY     no oophorectomy  . CATARACT EXTRACTION W/ INTRAOCULAR LENS  IMPLANT, BILATERAL Bilateral   . DILATION AND CURETTAGE OF UTERUS    . MASTECTOMY, RADICAL Right 1960s   remotely   . PERIPHERAL VASCULAR CATHETERIZATION  12/03/2015  . PERIPHERAL VASCULAR CATHETERIZATION N/A 12/03/2015   Procedure: Abdominal Aortogram;  Surgeon: Lorretta Harp, MD;  Location: [facility];  Service: Cardiovascular;  Laterality: N/A;  . PERIPHERAL VASCULAR CATHETERIZATION Bilateral 12/03/2015   Procedure:  Lower Extremity Angiography;  Surgeon: Lorretta Harp, MD;  Location: [facility] [place];  Service: Cardiovascular;  Laterality: Bilateral;  . PERIPHERAL VASCULAR CATHETERIZATION Bilateral 12/03/2015   Procedure: Peripheral Vascular Intervention;  Surgeon: Lorretta Harp, MD;  Location: [facility];  Service: Cardiovascular;  Laterality: Bilateral;  72mmx58mm Lifestream bilateral Common Iliac Arteries  . PERIPHERAL VASCULAR CATHETERIZATION Bilateral 12/03/2015   Procedure: Peripheral Vascular Atherectomy;  Surgeon: Lorretta Harp, MD;  Location: [facility];  Service: Cardiovascular;  Laterality: Bilateral;  Common Iliac Arteries  . THYROIDECTOMY      Social History   Socioeconomic History  . Marital status: Divorced    Spouse name: Not on file  . Number of children: 3  . Years of education: Not on file  . Highest education level: Not on file  Occupational History  . Occupation: retired   Scientific laboratory technician  . Financial resource strain: Not on file  . Food insecurity:    Worry: Not on file    Inability: Not on file  . Transportation needs:    Medical: Not on file    Non-medical: Not on file  Tobacco Use  . Smoking status: Current Every Day Smoker    Packs/day: 0.50    Years: 67.00    Pack years: 33.50    Types: Cigarettes  . Smokeless tobacco: Never Used  . Tobacco  comment: 1/2 ppd  Substance and Sexual Activity  . Alcohol use: No    Alcohol/week: 0.0 oz    Frequency: Never  . Drug use: No  . Sexual activity: Never  Lifestyle  . Physical activity:    Days per week: Not on file    Minutes per session: Not on file  . Stress: Not on file  Relationships  . Social connections:    Talks on phone: Not on file    Gets together: Not on file    Attends religious service: Not on file    Active member of club or organization: Not on file    Attends meetings of clubs or organizations: Not on file    Relationship status: Not on file  . Intimate partner violence:     Fear of current or ex partner: Not on file    Emotionally abused: Not on file    Physically abused: Not on file    Forced sexual activity: Not on file  Other Topics Concern  . Not on file  Social History Narrative   Lives by herself, no family near by   Has 3 children, 2 living, not in good terms w/ one son, the other one is in [place]   Emergency contact: Burnett Sheng a son ~ 2017, he had  lung ca, stroke (09-2011)      Still drives   Lost a sister died 83-15              Allergies as of 07/28/2017      Reactions   Septra [sulfamethoxazole-trimethoprim] Itching   Pt states she had a severe allergic reaction; she was in the ICU for 2 days because of taking this.   Ciprofloxacin Hcl Itching   Codeine Rash   Made her feel very strange      Medication List        Accurate as of 07/28/17 11:59 PM. Always use your most recent med list.          acetaminophen 500 MG tablet Commonly known as:  TYLENOL Take 500 mg by mouth 2 (two) times daily as needed (for pain.).   ALPRAZolam 0.25 MG tablet Commonly known as:  XANAX Take 1-2 tablets (0.25-0.5 mg total) by mouth at bedtime as needed for sleep.   AMBULATORY NON FORMULARY MEDICATION Right Breast Prosthesis  Dx: S34.19   aspirin EC 81 MG tablet Take 81 mg by mouth every evening.   atorvastatin 40 MG tablet Commonly known as:  LIPITOR Take 1 tablet (40 mg total) by mouth daily.   benazepril 10 MG tablet Commonly known as:  LOTENSIN Take 1 tablet (10 mg total) by mouth 2 (two) times daily.   felodipine 5 MG 24 hr tablet Commonly known as:  PLENDIL Take 1 tablet (5 mg total) daily by mouth.   valACYclovir 500 MG tablet Commonly known as:  VALTREX Take 1 tablet (500 mg total) by mouth 3 (three) times daily.   VITAMIN B 12 PO Take 1,000 mg by mouth daily.          Objective:   Physical Exam  Neck:     BP (!) 142/84 (BP Location: Left Arm, Patient Position: Sitting, Cuff Size: Small)   Pulse 72   Temp 97.8 F  (36.6 C) (Oral)   Resp 16   Ht 5\' 5"  (1.651 m)   Wt 113 lb (51.3 kg)   SpO2 97%   BMI 18.80 kg/m  General:   Well  developed, NAD, see BMI.  HEENT:  Normocephalic . Face symmetric, atraumatic. Ear and ear canal inspected, no rash or blisters. Mouth: Symmetric, no blisters Eyes: No conjunctival erythema, discharge or apparent problems. Neurologic:  alert & oriented X3.  Speech normal, gait appropriate for age and unassisted Psych--  Cognition and judgment appear intact.  Cooperative with normal attention span and concentration.  Behavior appropriate. No anxious or depressed appearing. \    Assessment & Plan:    Assessment  Prediabetes HTN Hyperlipidemia Insomnia-- rarely has xanax  COPD: smoker, XR suggestive of dx, no PFTs Osteoporosis 2003-- refused dexas consistently  B12 def (185 05-2013) CV: ---CVA 2006 ---Carotid artery dz--last [place] 09-2011 40-59% B- ICA, declined further testing ---AAA --last [place]  05-2015, 1 year -- PVD, + ABIs 10-2015, 11-2015: B iliac stents Shingles 10-2009 , ~ 07-2015 (seen elsewhere, L face); 08/2015 L neck, 07/2017 L neck Status post thyroidectomy, transient hypothyroidism Breast cancer 1960 Allergies- hives sometimes , saw derm    PLAN  Shingles: Recurrent, had similar symptoms at the left neck at least twice before.  Although is more than two days since the onset of symptoms, recommend Valtrex.  Pain control with Tylenol.  Knows to call if the rash is spread particularly if it goes near the eye.  Also call if pain become more intense. Insomnia: See last visit, we agreed on Ambien but she never tried because she is afraid of side effects, would like to go back on Xanax.  I agreed to  rx it,  #60 no refills.

## 2017-07-28 NOTE — Progress Notes (Signed)
Pre visit review using our clinic review tool, if applicable. No additional management support is needed unless otherwise documented below in the visit note. 

## 2017-07-28 NOTE — Assessment & Plan Note (Signed)
History of essential hypertension her blood pressure measured at 174/78.  She is on Lotensin and Plendil I believe her blood pressure is elevated because she is in pain from shingles.

## 2017-07-28 NOTE — Assessment & Plan Note (Signed)
History of peripheral arterial disease status post bilateral iliac diamondback orbital rotational atherectomy followed by covered stenting 12/03/2015 for lifestyle limiting claudication.  Her most recent Dopplers performed 06/27/2016 revealed a right ABI 0.94 and a left ABI 0.86 with a high-frequency signal in her right common iliac artery although she denies claudication.  We will repeat aortoiliac and lower extremity arterial Doppler studies.

## 2017-07-28 NOTE — Patient Instructions (Signed)
Medication Instructions: Your physician recommends that you continue on your current medications as directed. Please refer to the Current Medication list given to you today.  STOP Plavix   Testing/Procedures: Your physician has requested that you have a aorta and iliac duplex. During this test, an ultrasound is used to evaluate blood flow to the aorta and iliac arteries. Allow one hour for this exam. Do not eat after midnight the day before and avoid carbonated beverages.   Your physician has requested that you have a lower extremity arterial duplex. During this test, ultrasound is used to evaluate arterial blood flow in the legs. Allow one hour for this exam. There are no restrictions or special instructions.  Your physician has requested that you have an ankle brachial index (ABI). During this test an ultrasound and blood pressure cuff are used to evaluate the arteries that supply the arms and legs with blood. Allow thirty minutes for this exam. There are no restrictions or special instructions.  Follow-Up: Your physician wants you to follow-up in: 1 year with Dr. Gwenlyn Found. You will receive a reminder letter in the mail two months in advance. If you don't receive a letter, please call our office to schedule the follow-up appointment.  If you need a refill on your cardiac medications before your next appointment, please call your pharmacy.

## 2017-07-28 NOTE — Assessment & Plan Note (Signed)
She of hyperlipidemia on statin therapy with recent lipid profile performed 03/19/2017 revealing total cholesterol 142, LDL 77 and HDL 46.

## 2017-07-29 DIAGNOSIS — B029 Zoster without complications: Secondary | ICD-10-CM | POA: Insufficient documentation

## 2017-07-29 NOTE — Assessment & Plan Note (Signed)
Shingles: Recurrent, had similar symptoms at the left neck at least twice before.  Although is more than two days since the onset of symptoms, recommend Valtrex.  Pain control with Tylenol.  Knows to call if the rash is spread particularly if it goes near the eye.  Also call if pain become more intense. Insomnia: See last visit, we agreed on Ambien but she never tried because she is afraid of side effects, would like to go back on Xanax.  I agreed to  rx it,  #60 no refills.

## 2017-08-14 ENCOUNTER — Other Ambulatory Visit: Payer: Self-pay | Admitting: Internal Medicine

## 2017-08-18 ENCOUNTER — Ambulatory Visit (HOSPITAL_COMMUNITY)
Admission: RE | Admit: 2017-08-18 | Discharge: 2017-08-18 | Disposition: A | Discharge status: Home / Self Care | Payer: Medicare Other | Source: Ambulatory Visit | Attending: Cardiovascular Disease | Admitting: Cardiovascular Disease

## 2017-08-18 DIAGNOSIS — I739 Peripheral vascular disease, unspecified: Secondary | ICD-10-CM | POA: Insufficient documentation

## 2017-08-19 ENCOUNTER — Other Ambulatory Visit: Payer: Self-pay | Admitting: *Deleted

## 2017-08-19 DIAGNOSIS — I714 Abdominal aortic aneurysm, without rupture, unspecified: Secondary | ICD-10-CM

## 2017-08-19 DIAGNOSIS — I739 Peripheral vascular disease, unspecified: Secondary | ICD-10-CM

## 2017-10-14 ENCOUNTER — Other Ambulatory Visit: Payer: Self-pay | Admitting: Internal Medicine

## 2017-10-16 ENCOUNTER — Other Ambulatory Visit: Payer: Self-pay | Admitting: Internal Medicine

## 2018-01-16 IMAGING — US US CAROTID DUPLEX BILAT
1 series · 13 of 24 positions shown · non-contrast
Comparison: Report from a prior carotid duplex ultrasound at
[HOSPITAL] dated 09/25/2011

CLINICAL DATA: Bilateral carotid bruits. History of hypertension,
hypercholesterolemia and active smoking. Prior duplex ultrasound
demonstrated velocity elevation with estimated bilateral 40- 59% ICA
stenoses.

EXAM:
BILATERAL CAROTID DUPLEX ULTRASOUND
TECHNIQUE: Gray scale imaging, color Doppler and duplex ultrasound were
performed of bilateral carotid and vertebral arteries in the neck.

[Series 1: us carotid duplex bilat · 0.04mm/px · 13 of 98 slices shown]
[im 1/98]
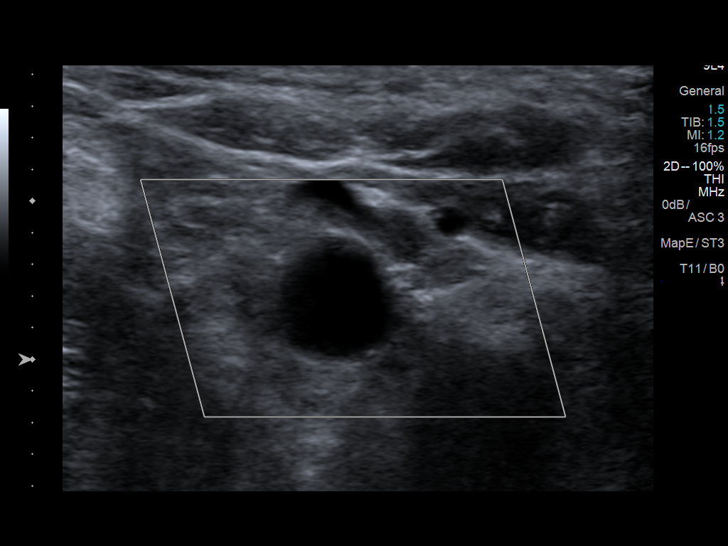
[im 9/98]
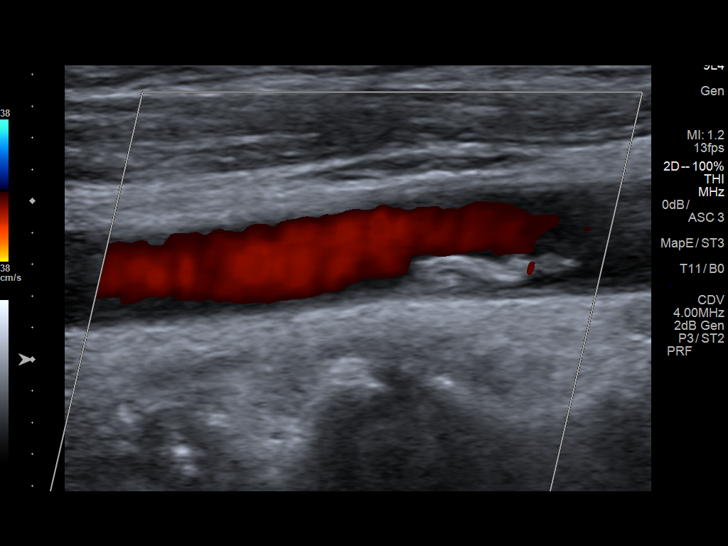
[im 17/98]
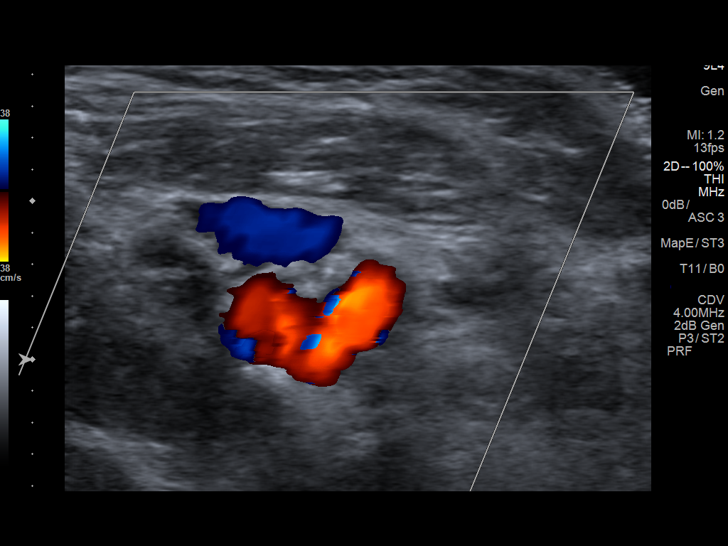
[im 26/98]
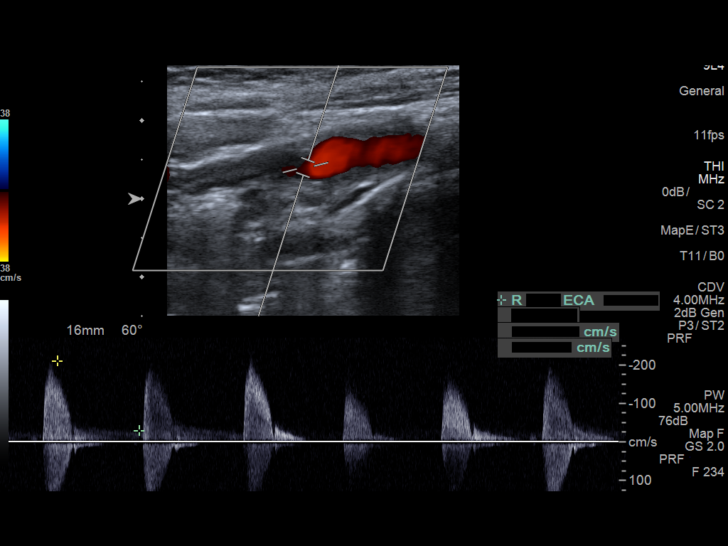
[im 34/98]
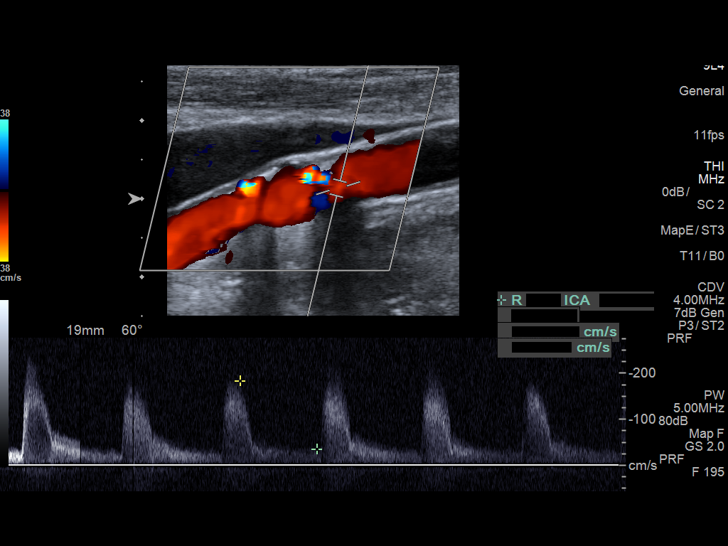
[im 43/98]
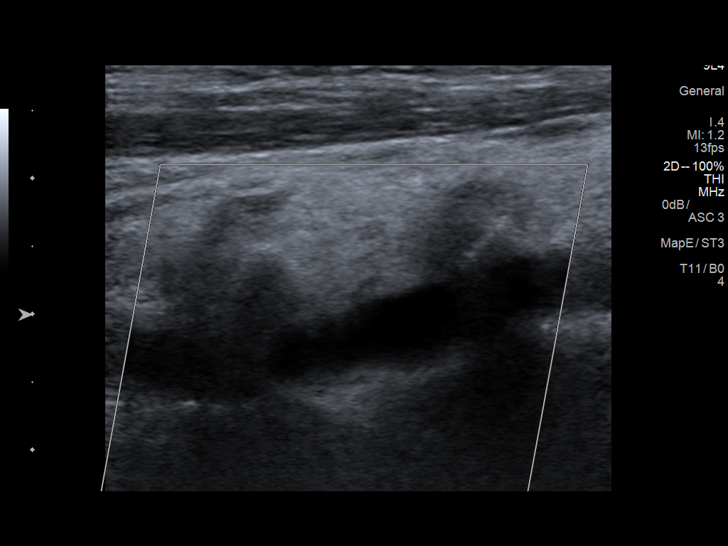
[im 51/98]
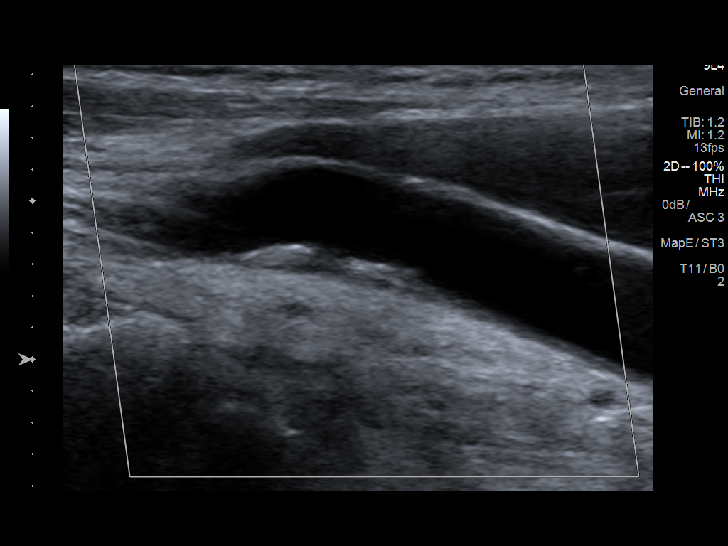
[im 55/98]
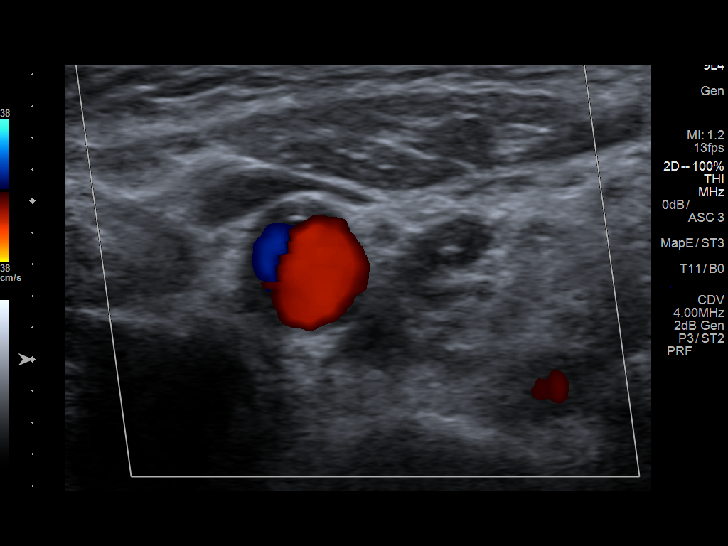
[im 64/98]
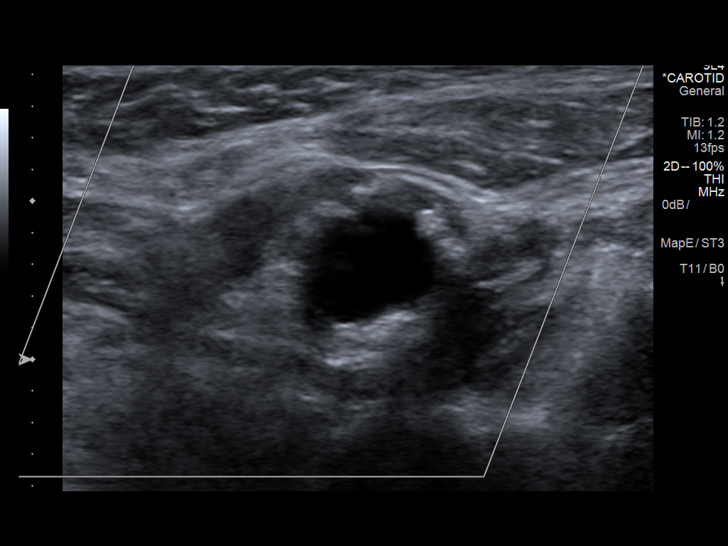
[im 72/98]
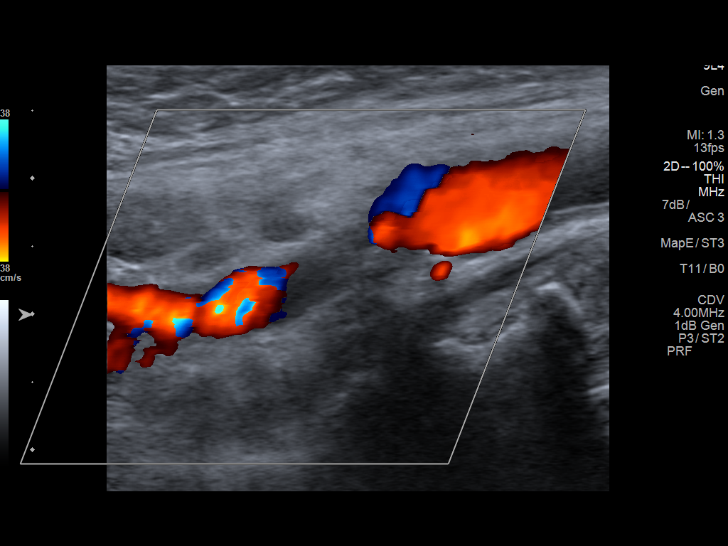
[im 81/98]
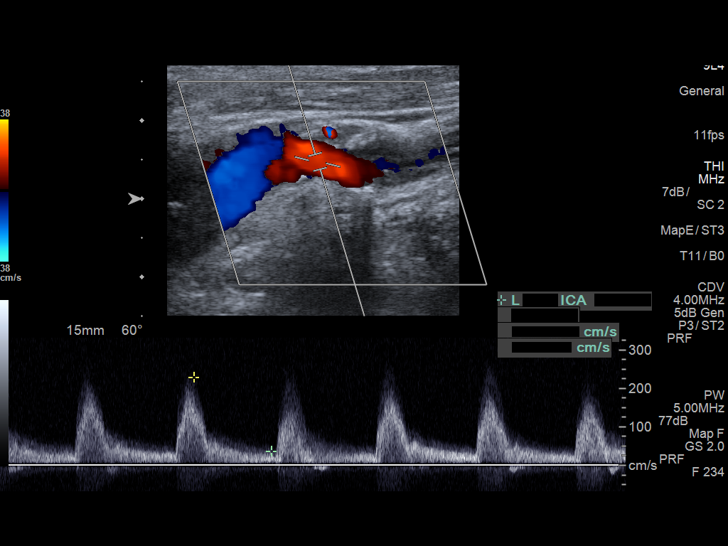
[im 89/98]
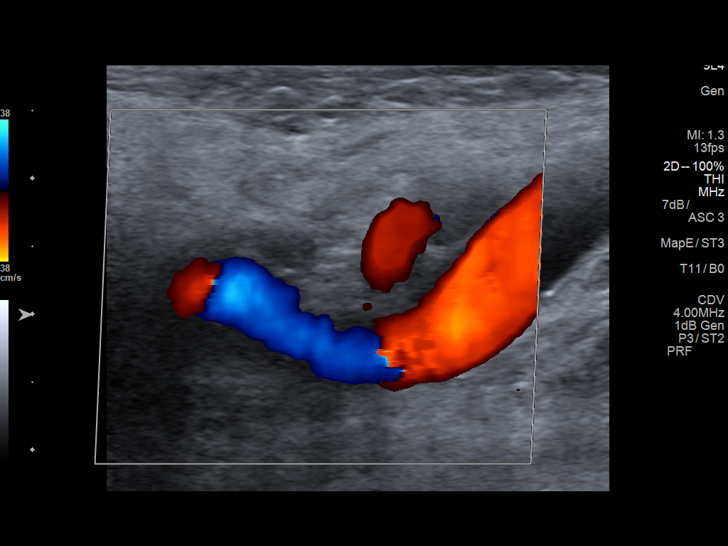
[im 98/98]
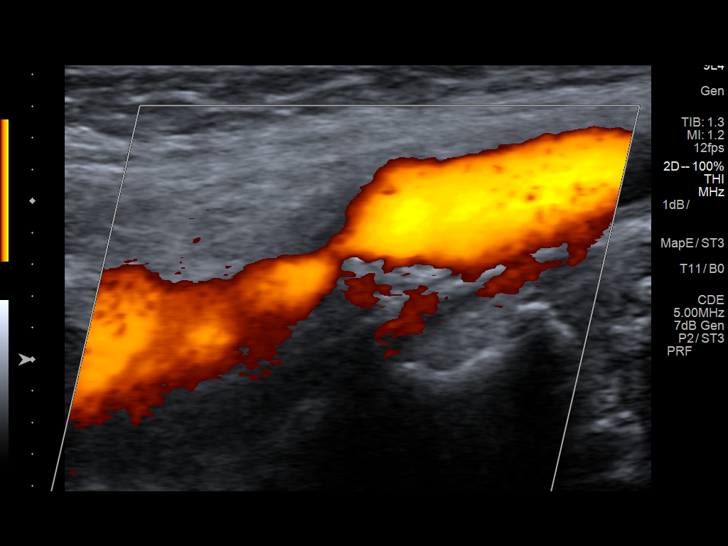

[13 of 24 positions shown; findings below may reference images not displayed]

FINDINGS: Criteria: Quantification of carotid stenosis is based on velocity
parameters that correlate the residual internal carotid diameter
with NASCET-based stenosis levels, using the diameter of the distal
internal carotid lumen as the denominator for stenosis measurement.

The following velocity measurements were obtained:

RIGHT

ICA:  190/27 cm/sec

CCA:  104/17 cm/sec

SYSTOLIC ICA/CCA RATIO:

DIASTOLIC ICA/CCA RATIO:

ECA:  254 cm/sec

LEFT

ICA:  231/42 cm/sec

CCA:  111/22 cm/sec

SYSTOLIC ICA/CCA RATIO:

DIASTOLIC ICA/CCA RATIO:

ECA:  320 cm/sec

RIGHT CAROTID ARTERY: A moderate amount of predominately calcified
plaque is present at the level of the distal common carotid artery,
carotid bulb and extending into both proximal internal and external
carotid arteries. Significant external carotid stenosis present.
Proximal and mid right ICA demonstrate turbulent flow and velocity
elevation corresponding to estimated 50- 69% stenosis. The ICA in
the upper neck is moderately tortuous.

RIGHT VERTEBRAL ARTERY: Antegrade flow with normal waveform and
velocity.

LEFT CAROTID ARTERY: There is a moderate amount of calcified plaque
at the level of the distal common carotid artery and carotid bulb.
Significant plaque is identified in the proximal internal and
external carotid arteries. Proximal left ECA stenosis is likely
severe. Maximal proximal ICA velocity is 231 cm/second with
turbulent flow present. Previously the outside duplex also
demonstrated maximal systolic velocity of 231 cm/second. This
corresponds to an estimated greater than 70% left ICA stenosis. The
rest of the internal carotid artery is moderately tortuous.

LEFT VERTEBRAL ARTERY: Antegrade flow with normal waveform and
velocity.
IMPRESSION: Significant atherosclerotic plaque at both carotid bifurcations.
Estimated right ICA stenosis is 50- 69%. Estimated left ICA stenosis
is greater than 70%. Maximal velocities in the proximal left ICA are
stable since 2068. Both proximal external carotid arteries show
velocity elevation consistent with significant stenoses.

## 2018-02-07 ENCOUNTER — Other Ambulatory Visit: Payer: Self-pay | Admitting: Internal Medicine

## 2018-02-11 DIAGNOSIS — K921 Melena: Secondary | ICD-10-CM | POA: Diagnosis not present

## 2018-02-11 DIAGNOSIS — R197 Diarrhea, unspecified: Secondary | ICD-10-CM | POA: Diagnosis not present

## 2018-02-11 DIAGNOSIS — K5900 Constipation, unspecified: Secondary | ICD-10-CM | POA: Diagnosis not present

## 2018-02-11 DIAGNOSIS — K582 Mixed irritable bowel syndrome: Secondary | ICD-10-CM | POA: Diagnosis not present

## 2018-03-18 DIAGNOSIS — K5730 Diverticulosis of large intestine without perforation or abscess without bleeding: Secondary | ICD-10-CM | POA: Diagnosis not present

## 2018-03-18 DIAGNOSIS — K921 Melena: Secondary | ICD-10-CM | POA: Diagnosis not present

## 2018-03-18 DIAGNOSIS — K648 Other hemorrhoids: Secondary | ICD-10-CM | POA: Diagnosis not present

## 2018-03-18 DIAGNOSIS — K5120 Ulcerative (chronic) proctitis without complications: Secondary | ICD-10-CM | POA: Diagnosis not present

## 2018-03-18 DIAGNOSIS — K621 Rectal polyp: Secondary | ICD-10-CM | POA: Diagnosis not present

## 2018-03-18 DIAGNOSIS — R194 Change in bowel habit: Secondary | ICD-10-CM | POA: Diagnosis not present

## 2018-03-18 DIAGNOSIS — R197 Diarrhea, unspecified: Secondary | ICD-10-CM | POA: Diagnosis not present

## 2018-03-18 DIAGNOSIS — K6289 Other specified diseases of anus and rectum: Secondary | ICD-10-CM | POA: Diagnosis not present

## 2018-03-22 ENCOUNTER — Ambulatory Visit: Payer: Medicare Other | Admitting: *Deleted

## 2018-04-10 ENCOUNTER — Other Ambulatory Visit: Payer: Self-pay | Admitting: Internal Medicine

## 2018-04-11 ENCOUNTER — Other Ambulatory Visit: Payer: Self-pay | Admitting: Internal Medicine

## 2018-05-12 ENCOUNTER — Ambulatory Visit (INDEPENDENT_AMBULATORY_CARE_PROVIDER_SITE_OTHER): Payer: Medicare Other | Admitting: Internal Medicine

## 2018-05-12 ENCOUNTER — Other Ambulatory Visit: Payer: Self-pay

## 2018-05-12 DIAGNOSIS — E785 Hyperlipidemia, unspecified: Secondary | ICD-10-CM

## 2018-05-12 NOTE — Progress Notes (Signed)
   Subjective:    Patient ID: Crystal Osborne, female    DOB: 06/07/1931, 85 y.o.   MRN: [id number]  DOS:  05/12/2018 Type of visit - description:   Call that number ending on [phone]: Disconnected Left message on the number ending on [phone]: LMOM , will try to call her later. Unable to complete this telephone assessment

## 2018-07-07 ENCOUNTER — Other Ambulatory Visit: Payer: Self-pay

## 2018-07-07 ENCOUNTER — Ambulatory Visit (INDEPENDENT_AMBULATORY_CARE_PROVIDER_SITE_OTHER): Payer: Medicare Other | Admitting: Internal Medicine

## 2018-07-07 DIAGNOSIS — R739 Hyperglycemia, unspecified: Secondary | ICD-10-CM | POA: Diagnosis not present

## 2018-07-07 DIAGNOSIS — R7989 Other specified abnormal findings of blood chemistry: Secondary | ICD-10-CM

## 2018-07-07 DIAGNOSIS — E785 Hyperlipidemia, unspecified: Secondary | ICD-10-CM

## 2018-07-07 DIAGNOSIS — I10 Essential (primary) hypertension: Secondary | ICD-10-CM

## 2018-07-07 DIAGNOSIS — F5101 Primary insomnia: Secondary | ICD-10-CM | POA: Diagnosis not present

## 2018-07-07 DIAGNOSIS — I6523 Occlusion and stenosis of bilateral carotid arteries: Secondary | ICD-10-CM

## 2018-07-07 MED ORDER — ALPRAZOLAM 0.25 MG PO TABS: 0.2500 mg | ORAL_TABLET | Freq: Every evening | ORAL | 0 refills | Status: DC | PRN

## 2018-07-07 MED ORDER — BENAZEPRIL HCL 10 MG PO TABS: 10.0000 mg | ORAL_TABLET | Freq: Two times a day (BID) | ORAL | 2 refills | Status: DC

## 2018-07-07 MED ORDER — ATORVASTATIN CALCIUM 40 MG PO TABS: 40.0000 mg | ORAL_TABLET | Freq: Every day | ORAL | 2 refills | Status: DC

## 2018-07-07 MED ORDER — FELODIPINE ER 5 MG PO TB24: 5.0000 mg | ORAL_TABLET | Freq: Every day | ORAL | 2 refills | Status: DC

## 2018-07-07 NOTE — Progress Notes (Signed)
Subjective:    Patient ID: Crystal Osborne, female    DOB: [birth year], 85 y.o.   MRN: [id number]  DOS:  07/07/2018 Type of visit - description: Attempted  to make this a video visit, due to technical difficulties from the patient side it was not possible  thus we proceeded with a Virtual Visit via Telephone    I connected with@ on 07/07/18 at 10:40 AM EDT by telephone and verified that I am speaking with the correct person using two identifiers.  THIS ENCOUNTER IS A VIRTUAL VISIT DUE TO COVID-19 - PATIENT WAS NOT SEEN IN THE OFFICE. PATIENT HAS CONSENTED TO VIRTUAL VISIT / TELEMEDICINE VISIT   Location of patient: home  Location of provider: office  I discussed the limitations, risks, security and privacy concerns of performing an evaluation and management service by telephone and the availability of in person appointments. I also discussed with the patient that there may be a patient responsible charge related to this service. The patient expressed understanding and agreed to proceed.   History of Present Illness: Routine office visit Peripheral vascular disease: Notes from cardiology reviewed. Occasionally feels depressed at nighttime, a chronic issue, denies the need for treatment or  history of suicidal ideas Insomnia: Symptoms are on and off, needs a refill on Xanax HTN: Check her BPs from time to time, does not remember any readings but the numbers are typically normal. High cholesterol: Due for a FLP. Saw GI earlier this year, reviewed   Review of Systems Denies fever chills. No cough, chest pain or difficulty breathing.  No sputum production. Occasional sneezing Still smoking Denies dizziness, diplopia or slurred speech.  Past Medical History:  Diagnosis Date  . AAA (abdominal aortic aneurysm) [facility])    sees Dr Eden Lathe   . B12 deficiency   . Cancer of right breast ([place]) 1960  . CAROTID ARTERY DISEASE 11/28/2008   sees Dr Oneida Alar   . COPD (chronic obstructive pulmonary  disease) ([place]) 01/25/2014  . CVA (cerebrovascular accident) ([place]) 2006  . Diabetes mellitus ([place]) 05/29/2010   A1c 6.0 on October 2011   . Hyperlipidemia   . Hypertension   . Osteoporosis 2003  . Pneumonia ~ 2014; ~ 2015  . Prolapse of female bladder, acquired   . Shingles    repeated episone 9/11 went to a UC  . TIA (transient ischemic attack) ~ 2005  . Transient hypothyroidism    d/t thyroidectomy  . UTI (lower urinary tract infection) May 2013    Past Surgical History:  Procedure Laterality Date  . ABDOMINAL HYSTERECTOMY     no oophorectomy  . CATARACT EXTRACTION W/ INTRAOCULAR LENS  IMPLANT, BILATERAL Bilateral   . DILATION AND CURETTAGE OF UTERUS    . MASTECTOMY, RADICAL Right 1960s   remotely   . PERIPHERAL VASCULAR CATHETERIZATION  12/03/2015  . PERIPHERAL VASCULAR CATHETERIZATION N/A 12/03/2015   Procedure: Abdominal Aortogram;  Surgeon: Lorretta Harp, MD;  Location: [place] [facility];  Service: Cardiovascular;  Laterality: N/A;  . PERIPHERAL VASCULAR CATHETERIZATION Bilateral 12/03/2015   Procedure: Lower Extremity Angiography;  Surgeon: Lorretta Harp, MD;  Location: [place] [facility];  Service: Cardiovascular;  Laterality: Bilateral;  . PERIPHERAL VASCULAR CATHETERIZATION Bilateral 12/03/2015   Procedure: Peripheral Vascular Intervention;  Surgeon: Lorretta Harp, MD;  Location: [place] [facility];  Service: Cardiovascular;  Laterality: Bilateral;  27mmx58mm Lifestream bilateral Common Iliac Arteries  . PERIPHERAL VASCULAR CATHETERIZATION Bilateral 12/03/2015   Procedure: Peripheral Vascular Atherectomy;  Surgeon: Roderic Palau  Adora Fridge, MD;  Location: [place] [facility];  Service: Cardiovascular;  Laterality: Bilateral;  Common Iliac Arteries  . THYROIDECTOMY      Social History   Socioeconomic History  . Marital status: Divorced    Spouse name: Not on file  . Number of children: 3  . Years of education: Not on file  . Highest education level: Not on file   Occupational History  . Occupation: retired   Scientific laboratory technician  . Financial resource strain: Not on file  . Food insecurity:    Worry: Not on file    Inability: Not on file  . Transportation needs:    Medical: Not on file    Non-medical: Not on file  Tobacco Use  . Smoking status: Current Every Day Smoker    Packs/day: 0.50    Years: 67.00    Pack years: 33.50    Types: Cigarettes  . Smokeless tobacco: Never Used  . Tobacco comment: 1/2 ppd  Substance and Sexual Activity  . Alcohol use: No    Alcohol/week: 0.0 standard drinks    Frequency: Never  . Drug use: No  . Sexual activity: Never  Lifestyle  . Physical activity:    Days per week: Not on file    Minutes per session: Not on file  . Stress: Not on file  Relationships  . Social connections:    Talks on phone: Not on file    Gets together: Not on file    Attends religious service: Not on file    Active member of club or organization: Not on file    Attends meetings of clubs or organizations: Not on file    Relationship status: Not on file  . Intimate partner violence:    Fear of current or ex partner: Not on file    Emotionally abused: Not on file    Physically abused: Not on file    Forced sexual activity: Not on file  Other Topics Concern  . Not on file  Social History Narrative   Lives by herself, no family near by   Has 3 children, 2 living, not in good terms w/ one son, the other one is in [place]   Emergency contact: Burnett Sheng a son ~ 2017, he had  lung ca, stroke (09-2011)      Still drives   Lost a sister died 62-15              Allergies as of 07/07/2018      Reactions   Septra [sulfamethoxazole-trimethoprim] Itching   Pt states she had a severe allergic reaction; she was in the ICU for 2 days because of taking this.   Ciprofloxacin Hcl Itching   Codeine Rash   Made her feel very strange      Medication List       Accurate as of Jul 07, 2018 10:41 AM. If you have any questions, ask your nurse or  doctor.        acetaminophen 500 MG tablet Commonly known as:  TYLENOL Take 500 mg by mouth 2 (two) times daily as needed (for pain.).   ALPRAZolam 0.25 MG tablet Commonly known as:  XANAX Take 1-2 tablets (0.25-0.5 mg total) by mouth at bedtime as needed for sleep.   AMBULATORY NON FORMULARY MEDICATION Right Breast Prosthesis  Dx: Z61.09   aspirin EC 81 MG tablet Take 81 mg by mouth every evening.   atorvastatin 40 MG tablet Commonly known as:  LIPITOR Take 1 tablet (40 mg total) by mouth daily at 12 noon.   benazepril 10 MG tablet Commonly known as:  LOTENSIN Take 1 tablet (10 mg total) by mouth 2 (two) times daily.   felodipine 5 MG 24 hr tablet Commonly known as:  PLENDIL Take 1 tablet (5 mg total) by mouth daily.   valACYclovir 500 MG tablet Commonly known as:  Valtrex Take 1 tablet (500 mg total) by mouth 3 (three) times daily.   VITAMIN B 12 PO Take 1,000 mg by mouth daily.           Objective:   Physical Exam There were no vitals taken for this visit. This is a virtual phone visit.  She seems to be doing well, speech is fluent, alert oriented x3    Assessment     Assessment  Prediabetes HTN Hyperlipidemia Insomnia-- rarely has xanax  COPD: smoker, XR suggestive of dx, no PFTs Osteoporosis 2003-- refused dexas consistently  B12 def (185 05-2013) CV: ---CVA 2006 ---Carotid artery dz--last [place] 09-2011 40-59% B- ICA, [place] 11/2015 R 50-69%, L 70% ---AAA --last [place]  05-2015, 1 year -- PVD, + ABIs 10-2015, 11-2015: B iliac stents Shingles 10-2009 , ~ 07-2015 (seen elsewhere, L face); 08/2015 L neck, 07/2017 L neck Status post thyroidectomy, transient hypothyroidism Breast cancer 1960 Allergies- hives sometimes , saw derm    PLAN  Prediabetes: Check A1c HTN: Continue with Lotensin, Plendil.  Report ambulatory BPs are okay but could not tell me any readings.  Check a CMP, CBC Hyperlipidemia: Continue Lipitor, check a FLP Insomnia: RF Xanax Mild depression  at night: Declines any treatment, see HPI Peripheral vascular disease: Last abdominal aorta ultrasound and lower extremity Dopplers performed 08-2017, follow-up by cardiology. Carotid artery disease: Due for carotid ultrasound, declines to schedule it at this point.  Denies stroke symptoms Saw GI 02/11/2018 due to hematochezia, diarrhea/constipation: She has chronic symptoms, worse lately, initial impression was functional vs IBD.  Colonoscopy was done.  Further advised per GI. Slightly elevated TSH: Recheck. Social: lives by herself, driving is very limited, family helps her.  Still independent on ADLs. RTC 6 months   I discussed the assessment and treatment plan with the patient. The patient was provided an opportunity to ask questions and all were answered. The patient agreed with the plan and demonstrated an understanding of the instructions.   The patient was advised to call back or seek an in-person evaluation if the symptoms worsen or if the condition fails to improve as anticipated.  I provided 25 minutes of non-face-to-face time during this encounter.  Kathlene November, MD

## 2018-07-08 ENCOUNTER — Telehealth: Payer: Self-pay | Admitting: Internal Medicine

## 2018-07-08 NOTE — Assessment & Plan Note (Signed)
Prediabetes: Check A1c HTN: Continue with Lotensin, Plendil.  Report ambulatory BPs are okay but could not tell me any readings.  Check a CMP, CBC Hyperlipidemia: Continue Lipitor, check a FLP Insomnia: RF Xanax Mild depression at night: Declines any treatment, see HPI Peripheral vascular disease: Last abdominal aorta ultrasound and lower extremity Dopplers performed 08-2017, follow-up by cardiology. Carotid artery disease: Due for carotid ultrasound, declines to schedule it at this point.  Denies stroke symptoms Saw GI 02/11/2018 due to hematochezia, diarrhea/constipation: She has chronic symptoms, worse lately, initial impression was functional vs IBD.  Colonoscopy was done.  Further advised per GI. Slightly elevated TSH: Recheck. Social: lives by herself, driving is very limited, family helps her.  Still independent on ADLs. RTC 6 months

## 2018-07-08 NOTE — Telephone Encounter (Signed)
FYI... SWP today in regards to scheduling her lab appt from her visit with you yesterday and her CPE for later this year.  Pt stated she relies on her neighbor for rides and her neighbor fell yesterday and is currently unable to bring her into the office to get her labs drawn, but she is hoping to get this appt scheduled next week sometime.  Pt stated she has no family living nearby and she relies on her neighbor for transportation. Pt did make her CPE appt for early Dec.

## 2018-07-09 NOTE — Telephone Encounter (Signed)
I understand, thank you. 

## 2018-07-10 ENCOUNTER — Other Ambulatory Visit: Payer: Self-pay | Admitting: Internal Medicine

## 2018-07-19 ENCOUNTER — Other Ambulatory Visit: Payer: Self-pay

## 2018-07-19 ENCOUNTER — Other Ambulatory Visit (INDEPENDENT_AMBULATORY_CARE_PROVIDER_SITE_OTHER): Payer: Medicare Other

## 2018-07-19 DIAGNOSIS — E785 Hyperlipidemia, unspecified: Secondary | ICD-10-CM

## 2018-07-19 DIAGNOSIS — R7989 Other specified abnormal findings of blood chemistry: Secondary | ICD-10-CM | POA: Diagnosis not present

## 2018-07-19 DIAGNOSIS — R739 Hyperglycemia, unspecified: Secondary | ICD-10-CM | POA: Diagnosis not present

## 2018-07-19 DIAGNOSIS — I10 Essential (primary) hypertension: Secondary | ICD-10-CM

## 2018-07-19 LAB — CBC WITH DIFFERENTIAL/PLATELET
Basophils Absolute: 0.1 10*3/uL (ref 0.0–0.1)
Basophils Relative: 0.9 % (ref 0.0–3.0)
Eosinophils Absolute: 0.1 10*3/uL (ref 0.0–0.7)
Eosinophils Relative: 1.4 % (ref 0.0–5.0)
HCT: 42.5 % (ref 36.0–46.0)
Hemoglobin: 14.1 g/dL (ref 12.0–15.0)
Lymphocytes Relative: 27.1 % (ref 12.0–46.0)
Lymphs Abs: 2.1 10*3/uL (ref 0.7–4.0)
MCHC: 33.3 g/dL (ref 30.0–36.0)
MCV: 92.8 fl (ref 78.0–100.0)
Monocytes Absolute: 0.6 10*3/uL (ref 0.1–1.0)
Monocytes Relative: 7.4 % (ref 3.0–12.0)
Neutro Abs: 4.8 10*3/uL (ref 1.4–7.7)
Neutrophils Relative %: 63.2 % (ref 43.0–77.0)
Platelets: 258 10*3/uL (ref 150.0–400.0)
RBC: 4.58 Mil/uL (ref 3.87–5.11)
RDW: 14.7 % (ref 11.5–15.5)
WBC: 7.6 10*3/uL (ref 4.0–10.5)

## 2018-07-19 LAB — COMPREHENSIVE METABOLIC PANEL
ALT: 10 U/L (ref 0–35)
AST: 17 U/L (ref 0–37)
Albumin: 4 g/dL (ref 3.5–5.2)
Alkaline Phosphatase: 114 U/L (ref 39–117)
BUN: 13 mg/dL (ref 6–23)
CO2: 28 mEq/L (ref 19–32)
Calcium: 9.5 mg/dL (ref 8.4–10.5)
Chloride: 106 mEq/L (ref 96–112)
Creatinine, Ser: 0.91 mg/dL (ref 0.40–1.20)
GFR: 58.75 mL/min — ABNORMAL LOW (ref >60.00)
Glucose, Bld: 100 mg/dL — ABNORMAL HIGH (ref 70–99)
Potassium: 4.7 mEq/L (ref 3.5–5.1)
Sodium: 143 mEq/L (ref 135–145)
Total Bilirubin: 0.7 mg/dL (ref 0.2–1.2)
Total Protein: 6.8 g/dL (ref 6.0–8.3)

## 2018-07-19 LAB — LIPID PANEL
Cholesterol: 153 mg/dL (ref 0–200)
HDL: 51.5 mg/dL (ref >39.00)
LDL Cholesterol: 79 mg/dL (ref 0–99)
NonHDL: 101.17
Total CHOL/HDL Ratio: 3
Triglycerides: 109 mg/dL (ref 0.0–149.0)
VLDL: 21.8 mg/dL (ref 0.0–40.0)

## 2018-07-19 LAB — HEMOGLOBIN A1C: Hgb A1c MFr Bld: 5.9 % (ref 4.6–6.5)

## 2018-07-19 LAB — TSH: TSH: 6.47 u[IU]/mL — ABNORMAL HIGH (ref 0.35–4.50)

## 2018-07-22 NOTE — Addendum Note (Signed)
Addended byDamita Dunnings D on: 07/22/2018 08:41 AM   Modules accepted: Orders

## 2018-08-30 ENCOUNTER — Ambulatory Visit (HOSPITAL_COMMUNITY)
Admission: RE | Admit: 2018-08-30 | Discharge: 2018-08-30 | Disposition: A | Discharge status: Home / Self Care | Payer: Medicare Other | Source: Ambulatory Visit | Attending: Internal Medicine | Admitting: Internal Medicine

## 2018-08-30 ENCOUNTER — Other Ambulatory Visit (HOSPITAL_COMMUNITY): Payer: Self-pay | Admitting: Cardiovascular Disease

## 2018-08-30 ENCOUNTER — Ambulatory Visit (HOSPITAL_BASED_OUTPATIENT_CLINIC_OR_DEPARTMENT_OTHER)
Admission: RE | Admit: 2018-08-30 | Discharge: 2018-08-30 | Disposition: A | Discharge status: Home / Self Care | Payer: Medicare Other | Source: Ambulatory Visit | Attending: Cardiovascular Disease | Admitting: Cardiovascular Disease

## 2018-08-30 ENCOUNTER — Telehealth: Payer: Self-pay

## 2018-08-30 ENCOUNTER — Other Ambulatory Visit: Payer: Self-pay

## 2018-08-30 DIAGNOSIS — I739 Peripheral vascular disease, unspecified: Secondary | ICD-10-CM

## 2018-08-30 DIAGNOSIS — I714 Abdominal aortic aneurysm, without rupture, unspecified: Secondary | ICD-10-CM

## 2018-08-30 DIAGNOSIS — Z95820 Peripheral vascular angioplasty status with implants and grafts: Secondary | ICD-10-CM

## 2018-08-30 NOTE — Telephone Encounter (Signed)
Dr. Berry will only see office visits on his full days. Please call patient to request that they convert 7/22 virtual visit to an office visit. If patient insists on having a virtual visit, please reschedule them for the next available virtual visit day (half days). 

## 2018-08-30 NOTE — Telephone Encounter (Signed)
Spoke with pt and advised that Dr. Gwenlyn Found only seeing OVs on 7/22. Informed her that appt to be cancelled and message sent to scheduling to follow up to reschedule. Pt agreeable and verbalized understanding

## 2018-09-01 ENCOUNTER — Telehealth: Payer: Medicare Other | Admitting: Cardiovascular Disease

## 2018-10-27 ENCOUNTER — Other Ambulatory Visit (INDEPENDENT_AMBULATORY_CARE_PROVIDER_SITE_OTHER): Payer: Medicare Other

## 2018-10-27 ENCOUNTER — Other Ambulatory Visit: Payer: Self-pay

## 2018-10-27 DIAGNOSIS — R7989 Other specified abnormal findings of blood chemistry: Secondary | ICD-10-CM | POA: Diagnosis not present

## 2018-10-27 LAB — TSH: TSH: 4.96 u[IU]/mL — ABNORMAL HIGH (ref 0.35–4.50)

## 2018-10-27 LAB — T4, FREE: Free T4: 0.95 ng/dL (ref 0.60–1.60)

## 2018-10-27 LAB — T3, FREE: T3, Free: 3.7 pg/mL (ref 2.3–4.2)

## 2018-11-02 ENCOUNTER — Telehealth: Payer: Self-pay | Admitting: Internal Medicine

## 2018-11-02 MED ORDER — VALACYCLOVIR HCL 1 G PO TABS: 1000.0000 mg | ORAL_TABLET | Freq: Three times a day (TID) | ORAL | 0 refills | Status: DC

## 2018-11-02 NOTE — Telephone Encounter (Signed)
Please see message below and advise.

## 2018-11-02 NOTE — Telephone Encounter (Signed)
Advise patient, I sent Valtrex to her pharmacy.  If she has any doubt  this is shingles or if she is not improving: Needs to be seen in person

## 2018-11-02 NOTE — Telephone Encounter (Signed)
Patient called and said that her shingles are back and would like for Dr. Larose Kells to send something in to her pharmacy [facility] [phone] - [place], [place]. If any questions please call patient back.

## 2018-11-02 NOTE — Telephone Encounter (Signed)
Spoke to patient and informed her that rx has been sent to pharmacy.  Advised if not better after taking medication, she would need to be seen in office.  Patient verbalized understanding.

## 2018-11-03 NOTE — Telephone Encounter (Signed)
Patient is requesting a call back from PCP or nurse due to the new medication.   Call back [phone]

## 2018-11-03 NOTE — Telephone Encounter (Signed)
Spoke w/ Pt- she received Valtrex and on package insert it said not take take caffeine with medication- Pt states she is a big coffee drinker and wanted to know why she couldn't have her coffee with the Valtrex- I informed her I wasn't sure and recommended she discuss w/ her pharmacist. She has already left a message there and is waiting a call back from them.

## 2018-11-04 ENCOUNTER — Ambulatory Visit (INDEPENDENT_AMBULATORY_CARE_PROVIDER_SITE_OTHER): Payer: Medicare Other | Admitting: Internal Medicine

## 2018-11-04 ENCOUNTER — Other Ambulatory Visit: Payer: Self-pay

## 2018-11-04 ENCOUNTER — Encounter: Payer: Self-pay | Admitting: Internal Medicine

## 2018-11-04 VITALS — BP 136/54 | HR 78 | Temp 96.7°F | Resp 16 | Ht 65.0 in | Wt 123.0 lb

## 2018-11-04 DIAGNOSIS — R21 Rash and other nonspecific skin eruption: Secondary | ICD-10-CM | POA: Diagnosis not present

## 2018-11-04 DIAGNOSIS — R7989 Other specified abnormal findings of blood chemistry: Secondary | ICD-10-CM

## 2018-11-04 DIAGNOSIS — Z853 Personal history of malignant neoplasm of breast: Secondary | ICD-10-CM

## 2018-11-04 MED ORDER — BETAMETHASONE DIPROPIONATE AUG 0.05 % EX CREA: TOPICAL_CREAM | Freq: Two times a day (BID) | CUTANEOUS | 0 refills | Status: DC

## 2018-11-04 MED ORDER — KETOCONAZOLE 2 % EX CREA: 1.0000 "application " | TOPICAL_CREAM | Freq: Every day | CUTANEOUS | 0 refills | Status: DC

## 2018-11-04 NOTE — Progress Notes (Signed)
Subjective:    Patient ID: Crystal Osborne, female    DOB: August 25, 1931, 85 y.o.   MRN: [id number]  DOS:  11/04/2018 Type of visit - description: Acute visit  Symptoms started approximately 10 days ago with a rash on the abdomen initially on the left, now on the right. It itches and burns. She is taking Valtrex empirically without help.  Review of Systems  No fever chills No other concerns Past Medical History:  Diagnosis Date  . AAA (abdominal aortic aneurysm) [facility])    sees Dr Eden Lathe   . B12 deficiency   . Cancer of right breast ([place]) 1960  . CAROTID ARTERY DISEASE 11/28/2008   sees Dr Oneida Alar   . COPD (chronic obstructive pulmonary disease) ([place]) 01/25/2014  . CVA (cerebrovascular accident) ([place]) 2006  . Diabetes mellitus ([place]) 05/29/2010   A1c 6.0 on October 2011   . Hyperlipidemia   . Hypertension   . Osteoporosis 2003  . Pneumonia ~ 2014; ~ 2015  . Prolapse of female bladder, acquired   . Shingles    repeated episone 9/11 went to a UC  . TIA (transient ischemic attack) ~ 2005  . Transient hypothyroidism    d/t thyroidectomy  . UTI (lower urinary tract infection) May 2013    Past Surgical History:  Procedure Laterality Date  . ABDOMINAL HYSTERECTOMY     no oophorectomy  . CATARACT EXTRACTION W/ INTRAOCULAR LENS  IMPLANT, BILATERAL Bilateral   . DILATION AND CURETTAGE OF UTERUS    . MASTECTOMY, RADICAL Right 1960s   remotely   . PERIPHERAL VASCULAR CATHETERIZATION  12/03/2015  . PERIPHERAL VASCULAR CATHETERIZATION N/A 12/03/2015   Procedure: Abdominal Aortogram;  Surgeon: Lorretta Harp, MD;  Location: [place] [facility];  Service: Cardiovascular;  Laterality: N/A;  . PERIPHERAL VASCULAR CATHETERIZATION Bilateral 12/03/2015   Procedure: Lower Extremity Angiography;  Surgeon: Lorretta Harp, MD;  Location: [place] [facility];  Service: Cardiovascular;  Laterality: Bilateral;  . PERIPHERAL VASCULAR CATHETERIZATION Bilateral 12/03/2015   Procedure: Peripheral  Vascular Intervention;  Surgeon: Lorretta Harp, MD;  Location: [place] [facility];  Service: Cardiovascular;  Laterality: Bilateral;  56mmx58mm Lifestream bilateral Common Iliac Arteries  . PERIPHERAL VASCULAR CATHETERIZATION Bilateral 12/03/2015   Procedure: Peripheral Vascular Atherectomy;  Surgeon: Lorretta Harp, MD;  Location: [place] [facility];  Service: Cardiovascular;  Laterality: Bilateral;  Common Iliac Arteries  . THYROIDECTOMY      Social History   Socioeconomic History  . Marital status: Divorced    Spouse name: Not on file  . Number of children: 3  . Years of education: Not on file  . Highest education level: Not on file  Occupational History  . Occupation: retired   Scientific laboratory technician  . Financial resource strain: Not on file  . Food insecurity    Worry: Not on file    Inability: Not on file  . Transportation needs    Medical: Not on file    Non-medical: Not on file  Tobacco Use  . Smoking status: Current Every Day Smoker    Packs/day: 0.50    Years: 67.00    Pack years: 33.50    Types: Cigarettes  . Smokeless tobacco: Never Used  . Tobacco comment: 1/2 ppd  Substance and Sexual Activity  . Alcohol use: No    Alcohol/week: 0.0 standard drinks    Frequency: Never  . Drug use: No  . Sexual activity: Never  Lifestyle  . Physical activity    Days  per week: Not on file    Minutes per session: Not on file  . Stress: Not on file  Relationships  . Social Herbalist on phone: Not on file    Gets together: Not on file    Attends religious service: Not on file    Active member of club or organization: Not on file    Attends meetings of clubs or organizations: Not on file    Relationship status: Not on file  . Intimate partner violence    Fear of current or ex partner: Not on file    Emotionally abused: Not on file    Physically abused: Not on file    Forced sexual activity: Not on file  Other Topics Concern  . Not on file  Social History  Narrative   Lives by herself, no family near by   Has 3 children, 2 living, not in good terms w/ one son, the other one is in [place]   Emergency contact: Burnett Sheng a son ~ 2017, he had  lung ca, stroke (09-2011)      Still drives   Lost a sister died 83-15              Allergies as of 11/04/2018      Reactions   Septra [sulfamethoxazole-trimethoprim] Itching   Pt states she had a severe allergic reaction; she was in the ICU for 2 days because of taking this.   Ciprofloxacin Hcl Itching   Codeine Rash   Made her feel very strange      Medication List       Accurate as of November 04, 2018 12:00 PM. If you have any questions, ask your nurse or doctor.        acetaminophen 500 MG tablet Commonly known as: TYLENOL Take 500 mg by mouth 2 (two) times daily as needed (for pain.).   ALPRAZolam 0.25 MG tablet Commonly known as: XANAX Take 1-2 tablets (0.25-0.5 mg total) by mouth at bedtime as needed for sleep.   AMBULATORY NON FORMULARY MEDICATION Right Breast Prosthesis  Dx: [id number]   aspirin EC 81 MG tablet Take 81 mg by mouth every evening.   atorvastatin 40 MG tablet Commonly known as: LIPITOR Take 1 tablet (40 mg total) by mouth daily at 12 noon.   benazepril 10 MG tablet Commonly known as: LOTENSIN Take 1 tablet (10 mg total) by mouth 2 (two) times daily.   felodipine 5 MG 24 hr tablet Commonly known as: PLENDIL Take 1 tablet (5 mg total) by mouth daily.   psyllium 28 % packet Commonly known as: METAMUCIL SMOOTH TEXTURE Take 1 packet by mouth 2 (two) times daily.   valACYclovir 1000 MG tablet Commonly known as: Valtrex Take 1 tablet (1,000 mg total) by mouth 3 (three) times daily.   VITAMIN B 12 PO Take 1,000 mg by mouth daily.           Objective:   Physical Exam BP (!) 136/54 (BP Location: Left Arm, Patient Position: Sitting, Cuff Size: Small)   Pulse 78   Temp (!) 96.7 F (35.9 C) (Temporal)   Resp 16   Ht 5\' 5"  (1.651 m)   Wt 123 lb (55.8  kg)   SpO2 98%   BMI 20.47 kg/m   General:   Well developed, NAD, BMI noted. HEENT:  Normocephalic . Face symmetric, atraumatic Skin:  See picture, has erythematous rash located surgical scar, also -particularly on the right- there  is some satellite redness. Neurologic:  alert & oriented X3.  Speech normal, gait appropriate for age and unassisted Psych--  Cognition and judgment appear intact.  Cooperative with normal attention span and concentration.  Behavior appropriate. No anxious or depressed appearing.        Assessment     Assessment  Prediabetes HTN Hyperlipidemia Insomnia-- rarely has xanax  COPD: smoker, XR suggestive of dx, no PFTs Osteoporosis 2003-- refused dexas consistently  B12 def (185 05-2013) CV: ---CVA 2006 ---Carotid artery dz--last [place] 09-2011 40-59% B- ICA, [place] 11/2015 R 50-69%, L 70% ---AAA --last [place]  05-2015, 1 year -- PVD, + ABIs 10-2015, 11-2015: B iliac stents Shingles 10-2009 , ~ 07-2015 (seen elsewhere, L face); 08/2015 L neck, 07/2017 L neck Status post thyroidectomy, transient hypothyroidism Breast cancer 1960 Allergies- hives sometimes , saw derm    PLAN  Rash: Not consistent with shingles, stop Valtrex. Fungal?  Eczematous? Plan: Diprolene and antifungals topicals twice a day for 1 week, call if not better.

## 2018-11-04 NOTE — Progress Notes (Signed)
Pre visit review using our clinic review tool, if applicable. No additional management support is needed unless otherwise documented below in the visit note. 

## 2018-11-04 NOTE — Patient Instructions (Addendum)
   Apply  both creams twice a day, 50-50 mix for 1 week  Stop Valtrex  Call if not gradually better

## 2018-11-06 NOTE — Assessment & Plan Note (Signed)
Rash: Not consistent with shingles, stop Valtrex. Fungal?  Eczematous? Plan: Diprolene and antifungals topicals twice a day for 1 week, call if not better.

## 2018-11-15 ENCOUNTER — Telehealth: Payer: Self-pay

## 2018-11-15 NOTE — Telephone Encounter (Signed)
Pt prefers to keep virtual appt rather than change appt type to OV for 10/7. Virtual appt rescheduled to 10/21 at 1000. Pt agreeable and marked on calendar

## 2018-11-17 ENCOUNTER — Telehealth: Payer: Medicare Other | Admitting: Cardiovascular Disease

## 2018-12-01 ENCOUNTER — Telehealth (INDEPENDENT_AMBULATORY_CARE_PROVIDER_SITE_OTHER): Payer: Medicare Other | Admitting: Cardiovascular Disease

## 2018-12-01 ENCOUNTER — Telehealth: Payer: Self-pay

## 2018-12-01 ENCOUNTER — Encounter: Payer: Self-pay | Admitting: Cardiovascular Disease

## 2018-12-01 DIAGNOSIS — J449 Chronic obstructive pulmonary disease, unspecified: Secondary | ICD-10-CM | POA: Diagnosis not present

## 2018-12-01 DIAGNOSIS — I714 Abdominal aortic aneurysm, without rupture, unspecified: Secondary | ICD-10-CM

## 2018-12-01 DIAGNOSIS — I739 Peripheral vascular disease, unspecified: Secondary | ICD-10-CM

## 2018-12-01 DIAGNOSIS — E782 Mixed hyperlipidemia: Secondary | ICD-10-CM | POA: Diagnosis not present

## 2018-12-01 DIAGNOSIS — I10 Essential (primary) hypertension: Secondary | ICD-10-CM | POA: Diagnosis not present

## 2018-12-01 DIAGNOSIS — F17200 Nicotine dependence, unspecified, uncomplicated: Secondary | ICD-10-CM

## 2018-12-01 NOTE — Patient Instructions (Addendum)
Medication Instructions:  NONE If you need a refill on your cardiac medications before your next appointment, please call your pharmacy.   Lab work: NONE If you have labs (blood work) drawn today and your tests are completely normal, you will receive your results only by: Marland Kitchen MyChart Message (if you have MyChart) OR . A paper copy in the mail If you have any lab test that is abnormal or we need to change your treatment, we will call you to review the results.  Testing/Procedures: Your physician has requested that you have a lower or upper extremity arterial duplex. This test is an ultrasound of the arteries in the legs or arms. It looks at arterial blood flow in the legs and arms. Allow one hour for Lower and Upper Arterial scans. There are no restrictions or special instructions DUE July 2021. YOU WILL BE CONTACTED BY A SCHEDULER TO SET UP THIS APPOINTMENT.  Your physician has requested that you have an ankle brachial index (ABI). During this test an ultrasound and blood pressure cuff are used to evaluate the arteries that supply the arms and legs with blood. Allow thirty minutes for this exam. There are no restrictions or special instructions. DUE July 2021. YOU WILL BE CONTACTED BY A SCHEDULER TO SET UP THIS APPOINTMENT.  Your physician has requested that you have an aorta/iliac duplex. During this test, an ultrasound is used to evaluate blood flow to the aorta and iliac arteries. Allow one hour for this exam. Do not eat after midnight the day before and avoid carbonated beverages. DUE July 2021. YOU WILL BE CONTACTED BY A SCHEDULER TO SET UP THIS APPOINTMENT.  Follow-Up: At [facility], you and your health needs are our priority.  As part of our continuing mission to provide you with exceptional heart care, we have created designated Provider Care Teams.  These Care Teams include your primary Cardiologist (physician) and Advanced Practice Providers (APPs -  Physician Assistants and Nurse  Practitioners) who all work together to provide you with the care you need, when you need it. You will need a follow up appointment in 12 months with Crystal Osborne.  Please call our office 2 months in advance to schedule this appointment.

## 2018-12-01 NOTE — Progress Notes (Signed)
Virtual Visit via Telephone Note   This visit type was conducted due to national recommendations for restrictions regarding the COVID-19 Pandemic (e.g. social distancing) in an effort to limit this patient's exposure and mitigate transmission in our community.  Due to her co-morbid illnesses, this patient is at least at moderate risk for complications without adequate follow up.  This format is felt to be most appropriate for this patient at this time.  The patient did not have access to video technology/had technical difficulties with video requiring transitioning to audio format only (telephone).  All issues noted in this document were discussed and addressed.  No physical exam could be performed with this format.  Please refer to the patient's chart for her  consent to telehealth for [facility].   Date:  12/01/2018   ID:  Crystal Osborne, DOB October 16, 1931, MRN [id number]  Patient Location: Home Provider Location: Home  PCP:  Colon Branch, MD  Cardiologist: Dr. Quay Burow Electrophysiologist:  None   Evaluation Performed:  Follow-Up Visit  Chief Complaint: Claudication  History of Present Illness:    Crystal Osborne is a 85 y.o.    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 She was referred by Dr. Larose Kells for peripheral vascular evaluation because of lifestyle limiting claudication.I last saw her in the office  07/28/2017.Her risk factor profile is notable for continued tobacco abuse of one half pack per day having smoked 35 pack years. She has treated hypertension and hyperlipidemia. There is a family history of heart disease. She has never had a heart attack but that has had a TIA remotely. She denies chest pain or shortness of breath. She's had bilateral lower extremity claudication right slightly worse than left which began 3 months ago. Dopplers performed in our  office 10/22/15 revealed a right ABI of 0.68 and a left ABI 0.78. She did have high frequency signals in the origins of both iliac arteries. I performed angiography 12/03/15 revealing high-grade calcified ostial bilateral iliac artery stenoses with three-vessel runoff. I performed diamondback orbital rotational atherectomy, PTA and covered stenting using Lifestream cover stents of both iliac ostia. Her Dopplers improved and her claudication resolved. This has remained durable by Doppler studies today.She had an episode of presyncope on 07/20/16. Her workup was unremarkable. EKG showed no acute changes. She's had no subsequent episodes.  Since I saw her in the office a year ago she continues to do well.  Her blood pressure measured yesterday by a neighbor was 121/65 with a pulse of 70.  She denies chest pain, shortness of breath or claudication.  She does continue to smoke however now greater than before at 1 pack/day because of COVID-19 and staying in.  She does work in the yard and is limited by back pain but not claudication.  Her most recent Dopplers performed 08/30/2018 revealed patent iliac stents with an aortic dimension of 3.4 cm.  The patient does not have symptoms concerning for COVID-19 infection (fever, chills, cough, or new shortness of breath).    Past Medical History:  Diagnosis Date   AAA (abdominal aortic aneurysm) [facility])    sees Dr Eden Lathe    B12 deficiency    Cancer of right breast [facility]) [place] 11/28/2008   sees Dr Oneida Alar    COPD (chronic obstructive pulmonary disease) ([place]) 01/25/2014   CVA (cerebrovascular accident) [facility]) 2006  Diabetes mellitus ([place]) 05/29/2010   A1c 6.0 on October 2011    Hyperlipidemia    Hypertension    Osteoporosis 2003   Pneumonia ~ 2014; ~ 2015   Prolapse of female bladder, acquired    Shingles    repeated episone 9/11 went to a UC   TIA (transient ischemic attack) ~ 2005   Transient hypothyroidism    d/t  thyroidectomy   UTI (lower urinary tract infection) May 2013   Past Surgical History:  Procedure Laterality Date   ABDOMINAL HYSTERECTOMY     no oophorectomy   CATARACT EXTRACTION W/ INTRAOCULAR LENS  IMPLANT, BILATERAL Bilateral    DILATION AND CURETTAGE OF UTERUS     MASTECTOMY, RADICAL Right 1960s   remotely    PERIPHERAL VASCULAR CATHETERIZATION  12/03/2015   PERIPHERAL VASCULAR CATHETERIZATION N/A 12/03/2015   Procedure: Abdominal Aortogram;  Surgeon: Lorretta Harp, MD;  Location: [place] [facility];  Service: Cardiovascular;  Laterality: N/A;   PERIPHERAL VASCULAR CATHETERIZATION Bilateral 12/03/2015   Procedure: Lower Extremity Angiography;  Surgeon: Lorretta Harp, MD;  Location: [place] [facility];  Service: Cardiovascular;  Laterality: Bilateral;   PERIPHERAL VASCULAR CATHETERIZATION Bilateral 12/03/2015   Procedure: Peripheral Vascular Intervention;  Surgeon: Lorretta Harp, MD;  Location: [place] [facility];  Service: Cardiovascular;  Laterality: Bilateral;  80mmx58mm Lifestream bilateral Common Iliac Arteries   PERIPHERAL VASCULAR CATHETERIZATION Bilateral 12/03/2015   Procedure: Peripheral Vascular Atherectomy;  Surgeon: Lorretta Harp, MD;  Location: [place] [facility];  Service: Cardiovascular;  Laterality: Bilateral;  Common Iliac Arteries   THYROIDECTOMY       No outpatient medications have been marked as taking for the 12/01/18 encounter (Appointment) with Lorretta Harp, MD.     Allergies:   Septra [sulfamethoxazole-trimethoprim], Ciprofloxacin hcl, and Codeine   Social History   Tobacco Use   Smoking status: Current Every Day Smoker    Packs/day: 0.50    Years: 67.00    Pack years: 33.50    Types: Cigarettes   Smokeless tobacco: Never Used   Tobacco comment: 1/2 ppd  Substance Use Topics   Alcohol use: No    Alcohol/week: 0.0 standard drinks    Frequency: Never   Drug use: No     Family Hx: The patient's family history  includes Cancer in an other family member. There is no history of Colon cancer, Coronary artery disease, Diabetes type II, or Breast cancer.  ROS:   Please see the history of present illness.     All other systems reviewed and are negative.   Prior CV studies:   The following studies were reviewed today:  Lower extremity arterial Doppler studies performed 08/30/2018  Labs/Other Tests and Data Reviewed:    EKG:  No ECG reviewed.  Recent Labs: 07/19/2018: ALT 10; BUN 13; Creatinine, Ser 0.91; Hemoglobin 14.1; Platelets 258.0; Potassium 4.7; Sodium 143 10/27/2018: TSH 4.96   Recent Lipid Panel Lab Results  Component Value Date/Time   CHOL 153 07/19/2018 09:19 AM   TRIG 109.0 07/19/2018 09:19 AM   TRIG 112 01/06/2006 08:41 AM   HDL 51.50 07/19/2018 09:19 AM   CHOLHDL 3 07/19/2018 09:19 AM   LDLCALC 79 07/19/2018 09:19 AM   LDLDIRECT 149.2 08/03/2006 10:54 AM    Wt Readings from Last 3 Encounters:  11/04/18 123 lb (55.8 kg)  07/28/17 113 lb (51.3 kg)  07/28/17 113 lb (51.3 kg)     Objective:    Vital Signs:  There were no vitals  taken for this visit.   VITAL SIGNS:  reviewed blood pressure measured by a friend/next-door neighbor yesterday was 121/65 with a pulse of 70.  A complete physical exam was not performed today since this was a virtual telemedicine phone visit  ASSESSMENT & PLAN:    1. Peripheral arterial disease-history of PAD status post high-grade bilateral calcified ostial common iliac artery stenoses with PTA and covered stenting using "kissing stent technique" with Lifestream stents 12/03/2015.  Follow-up Dopplers performed 08/30/2018 revealed it to be widely patent.  Patient denies claudication. 2. Essential hypertension-on Plendil and Lotensin with blood pressure measured yesterday at 121/65 with a pulse of 70 3. Hyperlipidemia-history of hyperlipidemia on atorvastatin with lipid profile performed 07/19/2018 revealing total cholesterol of 53, LDL 79 and HDL  51 4. Tobacco use-history tobacco abuse smoking 1/2 pack/day up until COVID-19 when her tobacco use increased 1 pack/day  COVID-19 Education: The signs and symptoms of COVID-19 were discussed with the patient and how to seek care for testing (follow up with PCP or arrange E-visit).  The importance of social distancing was discussed today.  Time:   Today, I have spent 5 minutes with the patient with telehealth technology discussing the above problems.     Medication Adjustments/Labs and Tests Ordered: Current medicines are reviewed at length with the patient today.  Concerns regarding medicines are outlined above.   Tests Ordered: No orders of the defined types were placed in this encounter.   Medication Changes: No orders of the defined types were placed in this encounter.   Follow Up:  In Person in 1 year(s)  Signed, Quay Burow, MD  12/01/2018 8:48 AM    [place] Osborne

## 2018-12-01 NOTE — Telephone Encounter (Signed)
Patient  aware of 10/21 AVS instructions and verbalized understanding.  Letter including After Visit Summary and any other necessary documents to be mailed to the patient's address on file.  Staff message sent to Medical Records pool to mail AVS to patient address.  

## 2019-01-13 ENCOUNTER — Other Ambulatory Visit: Payer: Self-pay

## 2019-01-14 ENCOUNTER — Ambulatory Visit (INDEPENDENT_AMBULATORY_CARE_PROVIDER_SITE_OTHER): Payer: Medicare Other | Admitting: Internal Medicine

## 2019-01-14 ENCOUNTER — Encounter: Payer: Self-pay | Admitting: Internal Medicine

## 2019-01-14 VITALS — BP 157/67 | HR 81 | Temp 96.1°F | Resp 16 | Ht 65.0 in | Wt 122.4 lb

## 2019-01-14 DIAGNOSIS — Z0000 Encounter for general adult medical examination without abnormal findings: Secondary | ICD-10-CM | POA: Diagnosis not present

## 2019-01-14 DIAGNOSIS — E538 Deficiency of other specified B group vitamins: Secondary | ICD-10-CM

## 2019-01-14 DIAGNOSIS — Z23 Encounter for immunization: Secondary | ICD-10-CM

## 2019-01-14 DIAGNOSIS — R7989 Other specified abnormal findings of blood chemistry: Secondary | ICD-10-CM | POA: Diagnosis not present

## 2019-01-14 LAB — BASIC METABOLIC PANEL
BUN: 14 mg/dL (ref 6–23)
CO2: 27 mEq/L (ref 19–32)
Calcium: 9.1 mg/dL (ref 8.4–10.5)
Chloride: 107 mEq/L (ref 96–112)
Creatinine, Ser: 0.8 mg/dL (ref 0.40–1.20)
GFR: 68.09 mL/min (ref >60.00)
Glucose, Bld: 81 mg/dL (ref 70–99)
Potassium: 3.9 mEq/L (ref 3.5–5.1)
Sodium: 143 mEq/L (ref 135–145)

## 2019-01-14 LAB — T4, FREE: Free T4: 1.03 ng/dL (ref 0.60–1.60)

## 2019-01-14 LAB — VITAMIN B12: Vitamin B-12: 1125 pg/mL — ABNORMAL HIGH (ref 211–911)

## 2019-01-14 LAB — T3, FREE: T3, Free: 3.6 pg/mL (ref 2.3–4.2)

## 2019-01-14 LAB — TSH: TSH: 4.4 u[IU]/mL (ref 0.35–4.50)

## 2019-01-14 MED ORDER — TETANUS-DIPHTH-ACELL PERTUSSIS 5-2.5-18.5 LF-MCG/0.5 IM SUSP: 0.5000 mL | Freq: Once | INTRAMUSCULAR | 0 refills | Status: AC

## 2019-01-14 NOTE — Patient Instructions (Addendum)
Please schedule Medicare Wellness with Glenard Haring.  GO TO THE LAB : Get the blood work   Continue checking your blood pressure, once weekly BP GOAL is between 110/65 and  135/85. If it is consistently higher or lower, let me know   I am going to send a note to the doctors at [facility] for you to transfer.  If you do not hear from them within the next 10 days please let me know

## 2019-01-14 NOTE — Progress Notes (Signed)
Subjective:    Patient ID: Crystal Osborne, female    DOB: 1931/02/18, 85 y.o.   MRN: [id number]  DOS:  01/14/2019 Type of visit - description: CPX The patient lives by herself, has no family or much support around [place].  Driving is getting more difficult.  Would like to transfer to a closer [place] location.  BP today slightly elevated, normal at home  BP Readings from Last 3 Encounters:  01/14/19 (!) 157/67  11/04/18 (!) 136/54  07/28/17 (!) 142/84     Review of Systems Physically, she feels more frail but emotionally doing okay. She continue smoking, denies chest pain, difficulty breathing, cough or hemoptysis.  Other than above, a 14 point review of systems is negative     Past Medical History:  Diagnosis Date  . AAA (abdominal aortic aneurysm) [facility])    sees Dr Eden Lathe   . B12 deficiency   . Cancer of right breast ([place]) 1960  . CAROTID ARTERY DISEASE 11/28/2008   sees Dr Oneida Alar   . COPD (chronic obstructive pulmonary disease) ([place]) 01/25/2014  . CVA (cerebrovascular accident) ([place]) 2006  . Diabetes mellitus ([place]) 05/29/2010   A1c 6.0 on October 2011   . Hyperlipidemia   . Hypertension   . Osteoporosis 2003  . Pneumonia ~ 2014; ~ 2015  . Prolapse of female bladder, acquired   . Shingles    repeated episone 9/11 went to a UC  . TIA (transient ischemic attack) ~ 2005  . Transient hypothyroidism    d/t thyroidectomy  . UTI (lower urinary tract infection) May 2013    Past Surgical History:  Procedure Laterality Date  . ABDOMINAL HYSTERECTOMY     no oophorectomy  . CATARACT EXTRACTION W/ INTRAOCULAR LENS  IMPLANT, BILATERAL Bilateral   . DILATION AND CURETTAGE OF UTERUS    . MASTECTOMY, RADICAL Right 1960s   remotely   . PERIPHERAL VASCULAR CATHETERIZATION  12/03/2015  . PERIPHERAL VASCULAR CATHETERIZATION N/A 12/03/2015   Procedure: Abdominal Aortogram;  Surgeon: Lorretta Harp, MD;  Location: [place] [facility];  Service: Cardiovascular;  Laterality:  N/A;  . PERIPHERAL VASCULAR CATHETERIZATION Bilateral 12/03/2015   Procedure: Lower Extremity Angiography;  Surgeon: Lorretta Harp, MD;  Location: [place] [facility];  Service: Cardiovascular;  Laterality: Bilateral;  . PERIPHERAL VASCULAR CATHETERIZATION Bilateral 12/03/2015   Procedure: Peripheral Vascular Intervention;  Surgeon: Lorretta Harp, MD;  Location: [place] [facility];  Service: Cardiovascular;  Laterality: Bilateral;  64mmx58mm Lifestream bilateral Common Iliac Arteries  . PERIPHERAL VASCULAR CATHETERIZATION Bilateral 12/03/2015   Procedure: Peripheral Vascular Atherectomy;  Surgeon: Lorretta Harp, MD;  Location: [place] [facility];  Service: Cardiovascular;  Laterality: Bilateral;  Common Iliac Arteries  . THYROIDECTOMY      Social History   Socioeconomic History  . Marital status: Divorced    Spouse name: Not on file  . Number of children: 3  . Years of education: Not on file  . Highest education level: Not on file  Occupational History  . Occupation: retired   Scientific laboratory technician  . Financial resource strain: Not on file  . Food insecurity    Worry: Not on file    Inability: Not on file  . Transportation needs    Medical: Not on file    Non-medical: Not on file  Tobacco Use  . Smoking status: Current Every Day Smoker    Packs/day: 0.50    Years: 67.00    Pack years: 33.50    Types:  Cigarettes  . Smokeless tobacco: Never Used  . Tobacco comment: 1/2 ppd  Substance and Sexual Activity  . Alcohol use: No    Alcohol/week: 0.0 standard drinks    Frequency: Never  . Drug use: No  . Sexual activity: Never  Lifestyle  . Physical activity    Days per week: Not on file    Minutes per session: Not on file  . Stress: Not on file  Relationships  . Social Herbalist on phone: Not on file    Gets together: Not on file    Attends religious service: Not on file    Active member of club or organization: Not on file    Attends meetings of clubs or  organizations: Not on file    Relationship status: Not on file  . Intimate partner violence    Fear of current or ex partner: Not on file    Emotionally abused: Not on file    Physically abused: Not on file    Forced sexual activity: Not on file  Other Topics Concern  . Not on file  Social History Narrative   Lives by herself, no family near by   Has 3 children, 2 living sons    Emergency contact: Dominica Severin (lives in [place])   [place] a son ~ 2017, he had  lung ca, stroke (09-2011)      Still drives   Lost a sister died 2-15             Family History  Problem Relation Age of Onset  . Cancer Other        sister (lung) , brother (mouth)  . Colon cancer Neg Hx   . Coronary artery disease Neg Hx   . Diabetes type II Neg Hx   . Breast cancer Neg Hx      Allergies as of 01/14/2019      Reactions   Septra [sulfamethoxazole-trimethoprim] Itching   Pt states she had a severe allergic reaction; she was in the ICU for 2 days because of taking this.   Ciprofloxacin Hcl Itching   Codeine Rash   Made her feel very strange      Medication List       Accurate as of January 14, 2019 11:59 PM. If you have any questions, ask your nurse or doctor.        STOP taking these medications   valACYclovir 1000 MG tablet Commonly known as: Valtrex Stopped by: Kathlene November, MD     TAKE these medications   acetaminophen 500 MG tablet Commonly known as: TYLENOL Take 500 mg by mouth 2 (two) times daily as needed (for pain.).   ALPRAZolam 0.25 MG tablet Commonly known as: XANAX Take 1-2 tablets (0.25-0.5 mg total) by mouth at bedtime as needed for sleep.   AMBULATORY NON FORMULARY MEDICATION Right Breast Prosthesis  Dx: [id number]   aspirin EC 81 MG tablet Take 81 mg by mouth every evening.   atorvastatin 40 MG tablet Commonly known as: LIPITOR Take 1 tablet (40 mg total) by mouth daily at 12 noon.   augmented betamethasone dipropionate 0.05 % cream Commonly known as: DIPROLENE-AF Apply  topically 2 (two) times daily.   benazepril 10 MG tablet Commonly known as: LOTENSIN Take 1 tablet (10 mg total) by mouth 2 (two) times daily.   felodipine 5 MG 24 hr tablet Commonly known as: PLENDIL Take 1 tablet (5 mg total) by mouth daily.   ketoconazole 2 % cream Commonly  known as: NIZORAL Apply 1 application topically daily.   psyllium 28 % packet Commonly known as: METAMUCIL SMOOTH TEXTURE Take 1 packet by mouth 2 (two) times daily.   Tdap 5-2.5-18.5 LF-MCG/0.5 injection Commonly known as: BOOSTRIX Inject 0.5 mLs into the muscle once for 1 dose. Started by: Kathlene November, MD   VITAMIN B 12 PO Take 1,000 mg by mouth daily.           Objective:   Physical Exam BP (!) 157/67 (BP Location: Left Arm, Patient Position: Sitting, Cuff Size: Small) Comment: hasn't taken meds yet  Pulse 81   Temp (!) 96.1 F (35.6 C) (Temporal)   Resp 16   Ht 5\' 5"  (1.651 m)   Wt 122 lb 6 oz (55.5 kg)   SpO2 97%   BMI 20.36 kg/m  General: Well developed, elderly lady, no distress.  Slightly underweight appearing   Neck: No  thyromegaly  HEENT:  Normocephalic . Face symmetric, atraumatic Lungs:  CTA B Normal respiratory effort, no intercostal retractions, no accessory muscle use. Heart: RRR,  no murmur.  No pretibial edema bilaterally  Abdomen:  Not distended, soft, non-tender. No rebound or rigidity.   Skin: Exposed areas without rash. Not pale. Not jaundice Neurologic:  alert & oriented X3.  Speech normal, gait appropriate for age and unassisted Strength symmetric and appropriate for age.  Psych: Cognition and judgment appear intact.  Cooperative with normal attention span and concentration.  Behavior appropriate. No anxious or depressed appearing.     Assessment    Assessment  Prediabetes HTN Hyperlipidemia Insomnia-- rarely has xanax  COPD: smoker, XR suggestive of dx, no PFTs Osteoporosis 2003-- refused dexas consistently  B12 def (185 05-2013) CV: ---CVA 2006  ---Carotid artery dz--last [place] 09-2011 40-59% B- ICA, declined further testing ---AAA --last [place]  05-2015, 1 year -- PVD, + ABIs 10-2015, 11-2015: B iliac stents Shingles 10-2009 and ~ 07-2015 (seen elsewhere, L face) Status post thyroidectomy, transient hypothyroidism Breast cancer 1960 Allergies- hives sometimes , saw derm    PLAN Here for CPX Prediabetes: Last A1c excellent.  Diet controlled HTN: BP slightly elevated today, good BPs at home, continue Lotensin, Plendil.  Check BMP Hyperlipidemia: Continue Lipitor, well controlled COPD: dx based on x-rays, essentially asymptomatic Tobacco abuse: Not ready to quit.  I will not insist. Osteoporosis: Has consistently declined DEXAs B12 deficiency: On supplements, checking labs Cardiovascular: Follow-up by cardiology Increased TSH: Recheck TFTs today Social: Likes to transfer to closer clinic, I will send a note to  [place] Next visit 6 months    This visit occurred during the SARS-CoV-2 public health emergency.  Safety protocols were in place, including screening questions prior to the visit, additional usage of staff PPE, and extensive cleaning of exam room while observing appropriate contact time as indicated for disinfecting solutions.

## 2019-01-14 NOTE — Progress Notes (Signed)
Pre visit review using our clinic review tool, if applicable. No additional management support is needed unless otherwise documented below in the visit note. 

## 2019-01-15 ENCOUNTER — Telehealth: Payer: Self-pay | Admitting: Internal Medicine

## 2019-01-15 NOTE — Assessment & Plan Note (Signed)
--  Td  3-10, Tdap printed  -pneumonia shot 02-2007, booster today - prevnar 09-2016 - zostavax 2010 - shingrex: d/w pt, declined for now - had a flu shot  -CCS:  Cscope 12-06- hyperplastic polyp, sigmoidoscopy 11-2008- bx benign mucosa, Dr. Sharlett Iles.  No further colonoscopy indicated -female care: refuses MMG; PAPs no longer indicated   -Diet and exercise discussed.

## 2019-01-15 NOTE — Telephone Encounter (Signed)
Please forward this note to the [facility]. Patient likes to transfer her care there because it is closer to home. Hopefully she will be accepted.

## 2019-01-15 NOTE — Assessment & Plan Note (Signed)
Here for CPX Prediabetes: Last A1c excellent.  Diet controlled HTN: BP slightly elevated today, good BPs at home, continue Lotensin, Plendil.  Check BMP Hyperlipidemia: Continue Lipitor, well controlled COPD: dx based on x-rays, essentially asymptomatic Tobacco abuse: Not ready to quit.  I will not insist. Osteoporosis: Has consistently declined DEXAs B12 deficiency: On supplements, checking labs Cardiovascular: Follow-up by cardiology Increased TSH: Recheck TFTs today Social: Likes to transfer to closer clinic, I will send a note to  [employer] Next visit 6 months

## 2019-01-19 NOTE — Telephone Encounter (Signed)
Tried to call to see which provider she would like to switch to but had to leave message for pt to call back, Will need to be approved by provider she wants to switch to first

## 2019-03-30 ENCOUNTER — Other Ambulatory Visit: Payer: Self-pay

## 2019-03-30 MED ORDER — BENAZEPRIL HCL 10 MG PO TABS: 10.0000 mg | ORAL_TABLET | Freq: Two times a day (BID) | ORAL | 1 refills | Status: DC

## 2019-03-30 MED ORDER — FELODIPINE ER 5 MG PO TB24: 5.0000 mg | ORAL_TABLET | Freq: Every day | ORAL | 1 refills | Status: DC

## 2019-05-30 ENCOUNTER — Other Ambulatory Visit: Payer: Self-pay

## 2019-06-02 ENCOUNTER — Other Ambulatory Visit: Payer: Self-pay

## 2019-06-02 ENCOUNTER — Ambulatory Visit (INDEPENDENT_AMBULATORY_CARE_PROVIDER_SITE_OTHER): Payer: Medicare Other | Admitting: Internal Medicine

## 2019-06-02 ENCOUNTER — Encounter: Payer: Self-pay | Admitting: Internal Medicine

## 2019-06-02 VITALS — BP 130/63 | HR 82 | Temp 96.5°F | Resp 18 | Ht 65.0 in | Wt 117.4 lb

## 2019-06-02 DIAGNOSIS — I10 Essential (primary) hypertension: Secondary | ICD-10-CM | POA: Diagnosis not present

## 2019-06-02 DIAGNOSIS — M545 Low back pain, unspecified: Secondary | ICD-10-CM

## 2019-06-02 DIAGNOSIS — F4321 Adjustment disorder with depressed mood: Secondary | ICD-10-CM

## 2019-06-02 DIAGNOSIS — R634 Abnormal weight loss: Secondary | ICD-10-CM

## 2019-06-02 MED ORDER — TRIAMCINOLONE ACETONIDE 0.1 % EX LOTN: 1.0000 "application " | TOPICAL_LOTION | Freq: Three times a day (TID) | CUTANEOUS | 0 refills | Status: DC

## 2019-06-02 NOTE — Patient Instructions (Addendum)
Please schedule Medicare Wellness with Glenard Haring.   Apply the lotion 2 to 3 times a day at your upper back.   GO TO THE FRONT DESK, PLEASE SCHEDULE YOUR APPOINTMENTS Come back for  a checkup in 3 months

## 2019-06-02 NOTE — Progress Notes (Signed)
Pre visit review using our clinic review tool, if applicable. No additional management support is needed unless otherwise documented below in the visit note. 

## 2019-06-02 NOTE — Progress Notes (Signed)
Subjective:    Patient ID: Crystal Osborne, female    DOB: January 04, 1932, 86 y.o.   MRN: [id number]  DOS:  06/02/2019 Type of visit - description: Follow-up  Since the last office visit, she lost her son  December 2020. Obviously affected by the her loss. She has good days and bad days.  I noted weight loss, she admits to poor appetite and not eating healthy.  Also reports low back pain, bilateral, no radiation, occasionally has bladder incontinence which is not a new problem.  She has IBS, chronic symptoms at baseline, from time to time she sees drops of blood when she wipes after a bowel movement, historically that has been related to hemorrhoids and she thinks that is the problem.  Complains of extremely itchy skin at the upper back mostly at night.  Wt Readings from Last 3 Encounters:  06/02/19 117 lb 6 oz (53.2 kg)  01/14/19 122 lb 6 oz (55.5 kg)  12/01/18 123 lb (55.8 kg)     Review of Systems See above   Past Medical History:  Diagnosis Date  . AAA (abdominal aortic aneurysm) [facility])    sees Dr Eden Lathe   . B12 deficiency   . Cancer of right breast ([place]) 1960  . CAROTID ARTERY DISEASE 11/28/2008   sees Dr Oneida Alar   . COPD (chronic obstructive pulmonary disease) ([place]) 01/25/2014  . CVA (cerebrovascular accident) ([place]) 2006  . Diabetes mellitus ([place]) 05/29/2010   A1c 6.0 on October 2011   . Hyperlipidemia   . Hypertension   . Osteoporosis 2003  . Pneumonia ~ 2014; ~ 2015  . Prolapse of female bladder, acquired   . Shingles    repeated episone 9/11 went to a UC  . TIA (transient ischemic attack) ~ 2005  . Transient hypothyroidism    d/t thyroidectomy  . UTI (lower urinary tract infection) May 2013    Past Surgical History:  Procedure Laterality Date  . ABDOMINAL HYSTERECTOMY     no oophorectomy  . CATARACT EXTRACTION W/ INTRAOCULAR LENS  IMPLANT, BILATERAL Bilateral   . DILATION AND CURETTAGE OF UTERUS    . MASTECTOMY, RADICAL Right 1960s   remotely   .  PERIPHERAL VASCULAR CATHETERIZATION  12/03/2015  . PERIPHERAL VASCULAR CATHETERIZATION N/A 12/03/2015   Procedure: Abdominal Aortogram;  Surgeon: Lorretta Harp, MD;  Location: [place] [facility];  Service: Cardiovascular;  Laterality: N/A;  . PERIPHERAL VASCULAR CATHETERIZATION Bilateral 12/03/2015   Procedure: Lower Extremity Angiography;  Surgeon: Lorretta Harp, MD;  Location: [place] [facility];  Service: Cardiovascular;  Laterality: Bilateral;  . PERIPHERAL VASCULAR CATHETERIZATION Bilateral 12/03/2015   Procedure: Peripheral Vascular Intervention;  Surgeon: Lorretta Harp, MD;  Location: [place] [facility];  Service: Cardiovascular;  Laterality: Bilateral;  73mmx58mm Lifestream bilateral Common Iliac Arteries  . PERIPHERAL VASCULAR CATHETERIZATION Bilateral 12/03/2015   Procedure: Peripheral Vascular Atherectomy;  Surgeon: Lorretta Harp, MD;  Location: [place] [facility];  Service: Cardiovascular;  Laterality: Bilateral;  Common Iliac Arteries  . THYROIDECTOMY      Allergies as of 06/02/2019      Reactions   Septra [sulfamethoxazole-trimethoprim] Itching   Pt states she had a severe allergic reaction; she was in the ICU for 2 days because of taking this.   Ciprofloxacin Hcl Itching   Codeine Rash   Made her feel very strange      Medication List       Accurate as of June 02, 2019 11:59 PM. If you  have any questions, ask your nurse or doctor.        acetaminophen 500 MG tablet Commonly known as: TYLENOL Take 500 mg by mouth 2 (two) times daily as needed (for pain.).   ALPRAZolam 0.25 MG tablet Commonly known as: XANAX Take 1-2 tablets (0.25-0.5 mg total) by mouth at bedtime as needed for sleep.   AMBULATORY NON FORMULARY MEDICATION Right Breast Prosthesis  Dx: [id number]   aspirin EC 81 MG tablet Take 81 mg by mouth every evening.   atorvastatin 40 MG tablet Commonly known as: LIPITOR Take 1 tablet (40 mg total) by mouth daily at 12 noon.   augmented  betamethasone dipropionate 0.05 % cream Commonly known as: DIPROLENE-AF Apply topically 2 (two) times daily.   benazepril 10 MG tablet Commonly known as: LOTENSIN Take 1 tablet (10 mg total) by mouth 2 (two) times daily.   felodipine 5 MG 24 hr tablet Commonly known as: PLENDIL Take 1 tablet (5 mg total) by mouth daily.   ketoconazole 2 % cream Commonly known as: NIZORAL Apply 1 application topically daily.   psyllium 28 % packet Commonly known as: METAMUCIL SMOOTH TEXTURE Take 1 packet by mouth 2 (two) times daily.   triamcinolone lotion 0.1 % Commonly known as: KENALOG Apply 1 application topically 3 (three) times daily. Started by: Kathlene November, MD   VITAMIN B 12 PO Take 1,000 mg by mouth daily.          Objective:   Physical Exam BP 130/63 (BP Location: Left Arm, Patient Position: Sitting, Cuff Size: Small)   Pulse 82   Temp (!) 96.5 F (35.8 C) (Temporal)   Resp 18   Ht 5\' 5"  (1.651 m)   Wt 117 lb 6 oz (53.2 kg)   SpO2 96%   BMI 19.53 kg/m  General:   Well developed, NAD, BMI noted. HEENT:  Normocephalic . Face symmetric, atraumatic Skin: Skin at the upper back is a slightly dry but otherwise normal.  She has multiple skin lesions consistent with SKs. Neurologic:  alert & oriented X3.  Speech normal, gait appropriate for age and unassisted Psych--  Cognition and judgment appear intact.  Cooperative with normal attention span and concentration.  Behavior appropriate. Tearful at times during the visit     Assessment     Assessment  Prediabetes HTN Hyperlipidemia Insomnia-- rarely has xanax  COPD: smoker, XR suggestive of dx, no PFTs Osteoporosis 2003-- refused dexas consistently  B12 def (185 05-2013) CV: ---CVA 2006 ---Carotid artery dz--last [place] 09-2011 40-59% B- ICA, declined further testing ---AAA --last [place]  05-2015, 1 year -- PVD, + ABIs 10-2015, 11-2015: B iliac stents Shingles 10-2009 and ~ 07-2015 (seen elsewhere, L face) Status post  thyroidectomy, transient hypothyroidism Breast cancer 1960 Allergies- hives sometimes , saw derm    PLAN Grieving: Lost a son back in December 2020, apparently from a heart condition. The patient is provided with counseling mostly listening therapy. She does not have suicidal ideas.  She still has 1 living son in the area and they get along well he provides support. She does not like to take medications in fact takes Xanax rarely even if she has insomnia. I recommended to consider a counselor, also she knows to call me anytime if she feels she is ready for SSRI. Weight loss: Likely due to above.  Reassess in 3 months. HTN: Controlled Low back pain: Observation, no red flag symptoms Itching, upper back: Skin is dry otherwise normal, recommend a moisturizing regularly and also  Kenalog lotion as needed. RTC 3 months  Today, I spent more than 35 minutes with the patient, mostly providing counseling and listening therapy, see above.  This visit occurred during the SARS-CoV-2 public health emergency.  Safety protocols were in place, including screening questions prior to the visit, additional usage of staff PPE, and extensive cleaning of exam room while observing appropriate contact time as indicated for disinfecting solutions.

## 2019-06-04 NOTE — Assessment & Plan Note (Signed)
Grieving: Lost a son back in December 2020, apparently from a heart condition. The patient is provided with counseling mostly listening therapy. She does not have suicidal ideas.  She still has 1 living son in the area and they get along well he provides support. She does not like to take medications in fact takes Xanax rarely even if she has insomnia. I recommended to consider a counselor, also she knows to call me anytime if she feels she is ready for SSRI. Weight loss: Likely due to above.  Reassess in 3 months. HTN: Controlled Low back pain: Observation, no red flag symptoms Itching, upper back: Skin is dry otherwise normal, recommend a moisturizing regularly and also Kenalog lotion as needed. RTC 3 months

## 2019-06-29 ENCOUNTER — Other Ambulatory Visit: Payer: Self-pay | Admitting: Internal Medicine

## 2019-09-05 ENCOUNTER — Ambulatory Visit (HOSPITAL_COMMUNITY)
Admission: RE | Admit: 2019-09-05 | Discharge: 2019-09-05 | Disposition: A | Discharge status: Home / Self Care | Payer: Medicare Other | Source: Ambulatory Visit | Attending: Cardiology | Admitting: Cardiology

## 2019-09-05 ENCOUNTER — Other Ambulatory Visit: Payer: Self-pay

## 2019-09-05 ENCOUNTER — Ambulatory Visit (HOSPITAL_BASED_OUTPATIENT_CLINIC_OR_DEPARTMENT_OTHER)
Admission: RE | Admit: 2019-09-05 | Discharge: 2019-09-05 | Disposition: A | Discharge status: Home / Self Care | Payer: Medicare Other | Source: Ambulatory Visit | Attending: Cardiovascular Disease | Admitting: Cardiovascular Disease

## 2019-09-05 DIAGNOSIS — Z95820 Peripheral vascular angioplasty status with implants and grafts: Secondary | ICD-10-CM | POA: Diagnosis not present

## 2019-09-05 DIAGNOSIS — I739 Peripheral vascular disease, unspecified: Secondary | ICD-10-CM | POA: Insufficient documentation

## 2019-09-07 ENCOUNTER — Other Ambulatory Visit: Payer: Self-pay

## 2019-09-07 DIAGNOSIS — I739 Peripheral vascular disease, unspecified: Secondary | ICD-10-CM

## 2019-09-07 DIAGNOSIS — I714 Abdominal aortic aneurysm, without rupture, unspecified: Secondary | ICD-10-CM

## 2019-10-04 ENCOUNTER — Other Ambulatory Visit: Payer: Self-pay | Admitting: Internal Medicine

## 2019-10-24 ENCOUNTER — Encounter: Payer: Self-pay | Admitting: Internal Medicine

## 2019-10-24 ENCOUNTER — Other Ambulatory Visit: Payer: Self-pay

## 2019-10-24 ENCOUNTER — Ambulatory Visit (INDEPENDENT_AMBULATORY_CARE_PROVIDER_SITE_OTHER): Payer: Medicare Other | Admitting: Internal Medicine

## 2019-10-24 VITALS — BP 131/75 | HR 72 | Temp 97.8°F | Resp 18 | Ht 65.0 in | Wt 116.5 lb

## 2019-10-24 DIAGNOSIS — F4321 Adjustment disorder with depressed mood: Secondary | ICD-10-CM

## 2019-10-24 DIAGNOSIS — R7989 Other specified abnormal findings of blood chemistry: Secondary | ICD-10-CM

## 2019-10-24 DIAGNOSIS — E782 Mixed hyperlipidemia: Secondary | ICD-10-CM | POA: Diagnosis not present

## 2019-10-24 DIAGNOSIS — I10 Essential (primary) hypertension: Secondary | ICD-10-CM

## 2019-10-24 DIAGNOSIS — L989 Disorder of the skin and subcutaneous tissue, unspecified: Secondary | ICD-10-CM

## 2019-10-24 DIAGNOSIS — I739 Peripheral vascular disease, unspecified: Secondary | ICD-10-CM

## 2019-10-24 NOTE — Progress Notes (Signed)
Pre visit review using our clinic review tool, if applicable. No additional management support is needed unless otherwise documented below in the visit note.

## 2019-10-24 NOTE — Patient Instructions (Signed)
   GO TO THE LAB : Get the blood work     GO TO THE FRONT DESK, PLEASE SCHEDULE YOUR APPOINTMENTS Come back for a physical exam in 6 months 

## 2019-10-24 NOTE — Progress Notes (Signed)
Subjective:    Patient ID: Crystal Osborne, female    DOB: 03-31-1931, 86 y.o.   MRN: [id number]  DOS:  10/24/2019 Type of visit - description: Follow-up Since the last office visit she is doing okay. We talk about multiple issues including grieving. She continue complaining of itchy on the upper back along with a mole that she cannot see or monitor.   Wt Readings from Last 3 Encounters:  10/24/19 116 lb 8 oz (52.8 kg)  06/02/19 117 lb 6 oz (53.2 kg)  01/14/19 122 lb 6 oz (55.5 kg)     Review of Systems Denies chest pain or difficulty breathing. No edema No palpitations When asked, admits to claudication described as pain distal to both knees with exertion. No nausea, vomiting, diarrhea. No postprandial abdominal pain  Past Medical History:  Diagnosis Date  . AAA (abdominal aortic aneurysm) [facility])    sees Dr Eden Lathe   . B12 deficiency   . Cancer of right breast ([place]) 1960  . CAROTID ARTERY DISEASE 11/28/2008   sees Dr Oneida Alar   . COPD (chronic obstructive pulmonary disease) ([place]) 01/25/2014  . CVA (cerebrovascular accident) ([place]) 2006  . Diabetes mellitus ([place]) 05/29/2010   A1c 6.0 on October 2011   . Hyperlipidemia   . Hypertension   . Osteoporosis 2003  . Pneumonia ~ 2014; ~ 2015  . Prolapse of female bladder, acquired   . Shingles    repeated episone 9/11 went to a UC  . TIA (transient ischemic attack) ~ 2005  . Transient hypothyroidism    d/t thyroidectomy  . UTI (lower urinary tract infection) May 2013    Past Surgical History:  Procedure Laterality Date  . ABDOMINAL HYSTERECTOMY     no oophorectomy  . CATARACT EXTRACTION W/ INTRAOCULAR LENS  IMPLANT, BILATERAL Bilateral   . DILATION AND CURETTAGE OF UTERUS    . MASTECTOMY, RADICAL Right 1960s   remotely   . PERIPHERAL VASCULAR CATHETERIZATION  12/03/2015  . PERIPHERAL VASCULAR CATHETERIZATION N/A 12/03/2015   Procedure: Abdominal Aortogram;  Surgeon: Lorretta Harp, MD;  Location: [place] [facility];   Service: Cardiovascular;  Laterality: N/A;  . PERIPHERAL VASCULAR CATHETERIZATION Bilateral 12/03/2015   Procedure: Lower Extremity Angiography;  Surgeon: Lorretta Harp, MD;  Location: [place] [facility];  Service: Cardiovascular;  Laterality: Bilateral;  . PERIPHERAL VASCULAR CATHETERIZATION Bilateral 12/03/2015   Procedure: Peripheral Vascular Intervention;  Surgeon: Lorretta Harp, MD;  Location: [place] [facility];  Service: Cardiovascular;  Laterality: Bilateral;  18mmx58mm Lifestream bilateral Common Iliac Arteries  . PERIPHERAL VASCULAR CATHETERIZATION Bilateral 12/03/2015   Procedure: Peripheral Vascular Atherectomy;  Surgeon: Lorretta Harp, MD;  Location: [place] [facility];  Service: Cardiovascular;  Laterality: Bilateral;  Common Iliac Arteries  . THYROIDECTOMY      Allergies as of 10/24/2019      Reactions   Septra [sulfamethoxazole-trimethoprim] Itching   Pt states she had a severe allergic reaction; she was in the ICU for 2 days because of taking this.   Ciprofloxacin Hcl Itching   Codeine Rash   Made her feel very strange      Medication List       Accurate as of October 24, 2019 11:27 AM. If you have any questions, ask your nurse or doctor.        acetaminophen 500 MG tablet Commonly known as: TYLENOL Take 500 mg by mouth 2 (two) times daily as needed (for pain.).   ALPRAZolam 0.25 MG tablet Commonly  known as: XANAX Take 1-2 tablets (0.25-0.5 mg total) by mouth at bedtime as needed for sleep.   AMBULATORY NON FORMULARY MEDICATION Right Breast Prosthesis  Dx: Y70.62   aspirin EC 81 MG tablet Take 81 mg by mouth every evening.   atorvastatin 40 MG tablet Commonly known as: LIPITOR Take 1 tablet (40 mg total) by mouth daily.   augmented betamethasone dipropionate 0.05 % cream Commonly known as: DIPROLENE-AF Apply topically 2 (two) times daily.   benazepril 10 MG tablet Commonly known as: LOTENSIN Take 1 tablet (10 mg total) by mouth 2 (two) times  daily.   felodipine 5 MG 24 hr tablet Commonly known as: PLENDIL Take 1 tablet (5 mg total) by mouth daily.   ketoconazole 2 % cream Commonly known as: NIZORAL Apply 1 application topically daily.   psyllium 28 % packet Commonly known as: METAMUCIL SMOOTH TEXTURE Take 1 packet by mouth 2 (two) times daily.   triamcinolone lotion 0.1 % Commonly known as: KENALOG Apply 1 application topically 3 (three) times daily.   VITAMIN B 12 PO Take 1,000 mg by mouth daily.          Objective:   Physical Exam BP 131/75 (BP Location: Right Arm, Patient Position: Sitting, Cuff Size: Small)   Pulse 72   Temp 97.8 F (36.6 C) (Oral)   Resp 18   Ht 5\' 5"  (1.651 m)   Wt 116 lb 8 oz (52.8 kg)   SpO2 95%   BMI 19.39 kg/m  General:   Well developed, NAD, BMI noted.  HEENT:  Normocephalic . Face symmetric, atraumatic Lungs:  CTA B Normal respiratory effort, no intercostal retractions, no accessory muscle use. Heart: RRR,  no murmur.  Abdomen:  Not distended, soft, non-tender. No rebound or rigidity.. No mass, very subtle bruit heard   RLQ > LLQ Skin: Mid back, has dark,oval skin lesion approximately 10 x 0.8 mm Lower extremities: no pretibial edema bilaterally.  Palpable bilateral femoral and pedal pulses Neurologic:  alert & oriented X3.  Speech normal, gait appropriate for age and unassisted Psych--  Cognition and judgment appear intact.  Cooperative with normal attention span and concentration.  Behavior appropriate. No anxious or depressed appearing.     Assessment      Assessment  Prediabetes HTN Hyperlipidemia Insomnia-- rarely has xanax  COPD: smoker, XR suggestive of dx, no PFTs Osteoporosis 2003-- refused dexas consistently  B12 def (185 05-2013) CV: ---CVA 2006 ---Carotid artery dz--last [place] 09-2011 40-59% B- ICA, declined further testing ---AAA --last [place]  05-2015, 1 year -- PVD, + ABIs 10-2015, 11-2015: B iliac stents Shingles 10-2009 and ~ 07-2015 (seen  elsewhere, L face) Status post thyroidectomy, transient hypothyroidism Breast cancer 1960 Allergies- hives sometimes , saw derm    PLAN Prediabetes: A1c stable for years, recheck on RTC HTN.  Ambulatory BPs reportedly normal, BP today is very good, check CMP, CBC, continue Lotensin, Plendil. Hyperlipidemia: On Lipitor, checking labs Grieving: Doing better since the last time. Weight loss: It has plateaued. Itching upper back, skin lesion: Skin lesion probably SK, she remains concerned.  Refer to Derm. Vascular disease: Reports claudication, had ABIs and aortic ultrasound 08-2019, was recommended by cardiology to repeat in 1 year.  Physical exam today is reassuring.  Encouraged to call if symptoms increase. Tobacco: Not ready to quit RTC 6 months     This visit occurred during the SARS-CoV-2 public health emergency.  Safety protocols were in place, including screening questions prior to the visit, additional usage  of staff PPE, and extensive cleaning of exam room while observing appropriate contact time as indicated for disinfecting solutions.

## 2019-10-25 LAB — CBC WITH DIFFERENTIAL/PLATELET
Absolute Monocytes: 670 cells/uL (ref 200–950)
Basophils Absolute: 54 cells/uL (ref 0–200)
Basophils Relative: 0.5 %
Eosinophils Absolute: 97 cells/uL (ref 15–500)
Eosinophils Relative: 0.9 %
HCT: 43 % (ref 35.0–45.0)
Hemoglobin: 13.9 g/dL (ref 11.7–15.5)
Lymphs Abs: 2030 cells/uL (ref 850–3900)
MCH: 29.8 pg (ref 27.0–33.0)
MCHC: 32.3 g/dL (ref 32.0–36.0)
MCV: 92.3 fL (ref 80.0–100.0)
MPV: 11 fL (ref 7.5–12.5)
Monocytes Relative: 6.2 %
Neutro Abs: 7949 cells/uL — ABNORMAL HIGH (ref 1500–7800)
Neutrophils Relative %: 73.6 %
Platelets: 278 10*3/uL (ref 140–400)
RBC: 4.66 10*6/uL (ref 3.80–5.10)
RDW: 13 % (ref 11.0–15.0)
Total Lymphocyte: 18.8 %
WBC: 10.8 10*3/uL (ref 3.8–10.8)

## 2019-10-25 LAB — LIPID PANEL
Cholesterol: 134 mg/dL (ref <200)
HDL: 51 mg/dL (ref >=50)
LDL Cholesterol (Calc): 63 mg/dL (calc)
Non-HDL Cholesterol (Calc): 83 mg/dL (calc) (ref <130)
Total CHOL/HDL Ratio: 2.6 (calc) (ref <5.0)
Triglycerides: 117 mg/dL (ref <150)

## 2019-10-25 LAB — COMPREHENSIVE METABOLIC PANEL
AG Ratio: 1.4 (calc) (ref 1.0–2.5)
ALT: 9 U/L (ref 6–29)
AST: 17 U/L (ref 10–35)
Albumin: 4 g/dL (ref 3.6–5.1)
Alkaline phosphatase (APISO): 120 U/L (ref 37–153)
BUN/Creatinine Ratio: 17 (calc) (ref 6–22)
BUN: 16 mg/dL (ref 7–25)
CO2: 26 mmol/L (ref 20–32)
Calcium: 9.3 mg/dL (ref 8.6–10.4)
Chloride: 106 mmol/L (ref 98–110)
Creat: 0.92 mg/dL — ABNORMAL HIGH (ref 0.60–0.88)
Globulin: 2.9 g/dL (calc) (ref 1.9–3.7)
Glucose, Bld: 90 mg/dL (ref 65–99)
Potassium: 4.3 mmol/L (ref 3.5–5.3)
Sodium: 142 mmol/L (ref 135–146)
Total Bilirubin: 0.7 mg/dL (ref 0.2–1.2)
Total Protein: 6.9 g/dL (ref 6.1–8.1)

## 2019-10-25 LAB — TSH: TSH: 4.42 mIU/L (ref 0.40–4.50)

## 2019-10-25 NOTE — Assessment & Plan Note (Signed)
Prediabetes: A1c stable for years, recheck on RTC HTN.  Ambulatory BPs reportedly normal, BP today is very good, check CMP, CBC, continue Lotensin, Plendil. Hyperlipidemia: On Lipitor, checking labs Grieving: Doing better since the last time. Weight loss: It has plateaued. Itching upper back, skin lesion: Skin lesion probably SK, she remains concerned.  Refer to Derm. Vascular disease: Reports claudication, had ABIs and aortic ultrasound 08-2019, was recommended by cardiology to repeat in 1 year.  Physical exam today is reassuring.  Encouraged to call if symptoms increase. Tobacco: Not ready to quit RTC 6 months

## 2019-10-31 DIAGNOSIS — L821 Other seborrheic keratosis: Secondary | ICD-10-CM | POA: Diagnosis not present

## 2019-10-31 DIAGNOSIS — D485 Neoplasm of uncertain behavior of skin: Secondary | ICD-10-CM | POA: Diagnosis not present

## 2019-11-03 ENCOUNTER — Other Ambulatory Visit: Payer: Self-pay | Admitting: Internal Medicine

## 2019-11-18 ENCOUNTER — Telehealth: Payer: Self-pay | Admitting: Internal Medicine

## 2019-11-18 NOTE — Telephone Encounter (Signed)
Please advise 

## 2019-11-18 NOTE — Telephone Encounter (Signed)
Caller Crystal Osborne Call Back # [phone]  Patient states her local pharmacy has change the  Manufacturer of  benazepril (LOTENSIN) 10 MG tablet [[id number]] .   Patient states dizziness has started while taking this medication  Please advise

## 2019-11-18 NOTE — Telephone Encounter (Signed)
Spoke w/ Pt-informed of PCP recommendations. She informed she already spoke w/ the pharmacist to double check medication. BP today was 158/80. Denies sinus congestion, ear pain. Tried to offer her a visit at the clinic but none available. She informed she didn't feel comfortable driving anyway. Instructed to hydrate, if not getting better let us know but if worse have someone take her to ED/UC over weekend. Pt verbalized understanding.

## 2019-11-18 NOTE — Telephone Encounter (Signed)
That is unusual, recommend to double check she was given the right medication and dose (10 mg BID). If she has severe symptoms and /or  headache, nausea, vomiting, double vision needs to be seen. Call if no better in 2 to 3 days

## 2019-11-24 ENCOUNTER — Telehealth: Payer: Self-pay | Admitting: Cardiovascular Disease

## 2019-11-24 NOTE — Telephone Encounter (Signed)
Pt states the manufacturer of her Benazepril changed about 3 weeks ago. This was confirmed for her by her pharmacist when the pt noticed that her pills "looked different." Pt states since beginning this medication, she has been suffering from feeling "swimmy headed," as though she may fall. Describes herself as appearing "drunk as I can be." States she nearly fell a few days ago, but has not injured herself. Patient takes her benazepril 10 mg at 9:30 a.m. and 10 mg at 9:30 p.m. She feels the dizziness and resultant mobility problems about an hour after taking her first dose. Denies cognitive or memory issues during these episodes. To prevent falls, pt states she tries to sit down for about 4 hours until she can get up again and move around safely. Pt took her a.m. dose of benazepril today (along with all over medications), but expresses reluctance to continue the medication. Pt states she spoke with her PCP's office about these symptoms about 2 weeks ago and was told to continue taking the medication. Pt states she was not given any other recommendations at that time.  Pt also reports noticing increased urination, primarily at night. Reports urinating 4-5 times per night. Pt reports urine has been "yellow" when it is normally pale/clear. Denies painful urination or cloudy urine, but does state urine occasionally has a foul odor. Denies any fevers.   Pt estimates she drinks 3 8-oz cup of water per day in addition to 3 8-oz cups black coffee per day.  Pt has a neighbor who comes to take her blood pressure. Most recent readings are: 10/8 -  158/80 (prior to meds that morning) 10/9 - 138/58 (1 hour after morning dose).  10/10 - 140/64 (4:30 p.m., between 1st and 2nd doses.)  Instructed patient to call EMS for any sudden onset of worsening dizziness or if she were to fall. Instructed pt to keep cell phone charged and with her at all times as a safety precaution.   Will seek Pharm D input and reach out to pt  with recommendations.

## 2019-11-24 NOTE — Telephone Encounter (Signed)
Pt c/o medication issue:  1. Name of Medication: Benazepril- problems since the manufacture was changed  2. How are you currently taking this medication (dosage and times per day)?  2 a day  3. Are you having a reaction (difficulty breathing--STAT)?bo  4. What is your medication issue? Staggering ,dizzy and more urinating

## 2019-11-25 MED ORDER — LISINOPRIL 10 MG PO TABS: 10.0000 mg | ORAL_TABLET | Freq: Two times a day (BID) | ORAL | 3 refills | Status: DC

## 2019-11-25 NOTE — Telephone Encounter (Signed)
Spoke with patient.  She reports no other changes since switching manufacturers.  Offered her the option of having her check with other pharmacies to see if any used the previous manufacturer, or trying lisinopril instead.  Patient agreeable to trying lisinopril.  Will start at same dose - 10 mg bid.  Her home BP readings (checked by neighbor) running 864-847 systolic.  Advised that it could take a week for side effects to go away.  Patient voiced understanding.

## 2019-11-28 MED ORDER — LISINOPRIL 10 MG PO TABS: 10.0000 mg | ORAL_TABLET | Freq: Two times a day (BID) | ORAL | 3 refills | Status: DC

## 2019-11-28 NOTE — Telephone Encounter (Signed)
Spoke to patient advised Lisinopril prescription sent to pharmacy.

## 2019-11-28 NOTE — Addendum Note (Signed)
Addended by: Kathyrn Lass on: 11/28/2019 12:29 PM   Modules accepted: Orders

## 2019-11-28 NOTE — Telephone Encounter (Signed)
Pt called in to check the status of the the new med that was suppose to be called in ?  She stated as of today the drug store does not have anything .    Best number [phone]

## 2019-12-12 DIAGNOSIS — H26493 Other secondary cataract, bilateral: Secondary | ICD-10-CM | POA: Diagnosis not present

## 2019-12-12 DIAGNOSIS — H35363 Drusen (degenerative) of macula, bilateral: Secondary | ICD-10-CM | POA: Diagnosis not present

## 2019-12-12 DIAGNOSIS — H3562 Retinal hemorrhage, left eye: Secondary | ICD-10-CM | POA: Diagnosis not present

## 2020-01-26 DIAGNOSIS — H26493 Other secondary cataract, bilateral: Secondary | ICD-10-CM | POA: Diagnosis not present

## 2020-01-26 DIAGNOSIS — H35042 Retinal micro-aneurysms, unspecified, left eye: Secondary | ICD-10-CM | POA: Diagnosis not present

## 2020-01-26 DIAGNOSIS — H35373 Puckering of macula, bilateral: Secondary | ICD-10-CM | POA: Diagnosis not present

## 2020-01-26 DIAGNOSIS — Z961 Presence of intraocular lens: Secondary | ICD-10-CM | POA: Diagnosis not present

## 2020-01-26 DIAGNOSIS — H353132 Nonexudative age-related macular degeneration, bilateral, intermediate dry stage: Secondary | ICD-10-CM | POA: Diagnosis not present

## 2020-03-21 ENCOUNTER — Other Ambulatory Visit: Payer: Self-pay | Admitting: Cardiovascular Disease

## 2020-04-06 ENCOUNTER — Other Ambulatory Visit: Payer: Self-pay | Admitting: Cardiovascular Disease

## 2020-04-10 ENCOUNTER — Other Ambulatory Visit: Payer: Self-pay | Admitting: Cardiovascular Disease

## 2020-04-23 ENCOUNTER — Encounter: Payer: Self-pay | Admitting: Internal Medicine

## 2020-05-03 ENCOUNTER — Other Ambulatory Visit: Payer: Self-pay | Admitting: Internal Medicine

## 2020-05-04 ENCOUNTER — Other Ambulatory Visit: Payer: Self-pay | Admitting: Internal Medicine

## 2020-05-21 ENCOUNTER — Other Ambulatory Visit: Payer: Self-pay

## 2020-05-21 ENCOUNTER — Encounter: Payer: Self-pay | Admitting: Internal Medicine

## 2020-05-21 ENCOUNTER — Ambulatory Visit (INDEPENDENT_AMBULATORY_CARE_PROVIDER_SITE_OTHER): Payer: Medicare Other | Admitting: Internal Medicine

## 2020-05-21 VITALS — BP 122/66 | HR 86 | Temp 97.0°F | Ht 65.0 in | Wt 115.8 lb

## 2020-05-21 DIAGNOSIS — R739 Hyperglycemia, unspecified: Secondary | ICD-10-CM | POA: Diagnosis not present

## 2020-05-21 DIAGNOSIS — F32A Depression, unspecified: Secondary | ICD-10-CM

## 2020-05-21 DIAGNOSIS — F419 Anxiety disorder, unspecified: Secondary | ICD-10-CM

## 2020-05-21 DIAGNOSIS — R7303 Prediabetes: Secondary | ICD-10-CM

## 2020-05-21 DIAGNOSIS — I10 Essential (primary) hypertension: Secondary | ICD-10-CM

## 2020-05-21 DIAGNOSIS — E038 Other specified hypothyroidism: Secondary | ICD-10-CM

## 2020-05-21 LAB — BASIC METABOLIC PANEL
BUN: 16 mg/dL (ref 6–23)
CO2: 27 mEq/L (ref 19–32)
Calcium: 9 mg/dL (ref 8.4–10.5)
Chloride: 107 mEq/L (ref 96–112)
Creatinine, Ser: 0.8 mg/dL (ref 0.40–1.20)
GFR: 66.5 mL/min (ref >60.00)
Glucose, Bld: 82 mg/dL (ref 70–99)
Potassium: 3.9 mEq/L (ref 3.5–5.1)
Sodium: 143 mEq/L (ref 135–145)

## 2020-05-21 LAB — TSH: TSH: 3.51 u[IU]/mL (ref 0.35–4.50)

## 2020-05-21 LAB — T4, FREE: Free T4: 0.88 ng/dL (ref 0.60–1.60)

## 2020-05-21 LAB — HEMOGLOBIN A1C: Hgb A1c MFr Bld: 5.7 % (ref 4.6–6.5)

## 2020-05-21 MED ORDER — LISINOPRIL 20 MG PO TABS: 20.0000 mg | ORAL_TABLET | Freq: Every day | ORAL | 1 refills | Status: DC

## 2020-05-21 NOTE — Patient Instructions (Signed)
Continue taking lisinopril 20 mg a day.  Check your blood pressure once or twice a week BP GOAL is between 110/65 and  135/85. If it is consistently higher or lower, let me know  Consider get Tdap, it is a vaccine that has tetanus and a whooping cough component.  You can get it at the pharmacy.   GO TO THE LAB : Get the blood work     [place], [place] Come back for checkup in 6 months     "Living will", "[place] of attorney": Advanced care planning  (If you already have a living will or healthcare power of attorney, please bring the copy to be scanned in your chart.)  Advance care planning is a process that supports adults in  understanding and sharing their preferences regarding future medical care.   The patient's preferences are recorded in documents called Advance Directives.    Advanced directives are completed (and can be modified at any time) while the patient is in full mental capacity.   The documentation should be available at all times to the patient, the family and the healthcare providers.  Bring in a copy to be scanned in your chart is an excellent idea and is recommended   This legal documents direct treatment decision making and/or appoint a surrogate to make the decision if the patient is not capable to do so.    Advance directives can be documented in many types of formats,  documents have names such as:  Lliving will  Durable power of attorney for healthcare (healthcare proxy or healthcare power of attorney)  Combined directives  Physician orders for life-sustaining treatment    More information at:  [email or web address]

## 2020-05-21 NOTE — Progress Notes (Signed)
Subjective:    Patient ID: Crystal Osborne, female    DOB: Jan 15, 1932, 87 y.o.   MRN: [id number]  DOS:  05/21/2020 Type of visit - description: Follow-up  Since the last office visit is doing okay. ACE inhibitors were changed by cardiology. Ambulatory BPs typically okay although she could not give me any readings.  Review of Systems When asked, admits to some anxiety related to family issue. History of COPD, denies cough, sputum production or wheezing.   Past Medical History:  Diagnosis Date  . AAA (abdominal aortic aneurysm) [facility])    sees Dr Eden Lathe   . B12 deficiency   . Cancer of right breast ([place]) 1960  . CAROTID ARTERY DISEASE 11/28/2008   sees Dr Oneida Alar   . COPD (chronic obstructive pulmonary disease) ([place]) 01/25/2014  . CVA (cerebrovascular accident) ([place]) 2006  . Diabetes mellitus ([place]) 05/29/2010   A1c 6.0 on October 2011   . Hyperlipidemia   . Hypertension   . Osteoporosis 2003  . Pneumonia ~ 2014; ~ 2015  . Prolapse of female bladder, acquired   . Shingles    repeated episone 9/11 went to a UC  . TIA (transient ischemic attack) ~ 2005  . Transient hypothyroidism    d/t thyroidectomy  . UTI (lower urinary tract infection) May 2013    Past Surgical History:  Procedure Laterality Date  . ABDOMINAL HYSTERECTOMY     no oophorectomy  . CATARACT EXTRACTION W/ INTRAOCULAR LENS  IMPLANT, BILATERAL Bilateral   . DILATION AND CURETTAGE OF UTERUS    . MASTECTOMY, RADICAL Right 1960s   remotely   . PERIPHERAL VASCULAR CATHETERIZATION  12/03/2015  . PERIPHERAL VASCULAR CATHETERIZATION N/A 12/03/2015   Procedure: Abdominal Aortogram;  Surgeon: Lorretta Harp, MD;  Location: [place] [facility];  Service: Cardiovascular;  Laterality: N/A;  . PERIPHERAL VASCULAR CATHETERIZATION Bilateral 12/03/2015   Procedure: Lower Extremity Angiography;  Surgeon: Lorretta Harp, MD;  Location: [place] [facility];  Service: Cardiovascular;  Laterality: Bilateral;  . PERIPHERAL  VASCULAR CATHETERIZATION Bilateral 12/03/2015   Procedure: Peripheral Vascular Intervention;  Surgeon: Lorretta Harp, MD;  Location: [place] [facility];  Service: Cardiovascular;  Laterality: Bilateral;  46mmx58mm Lifestream bilateral Common Iliac Arteries  . PERIPHERAL VASCULAR CATHETERIZATION Bilateral 12/03/2015   Procedure: Peripheral Vascular Atherectomy;  Surgeon: Lorretta Harp, MD;  Location: [place] [facility];  Service: Cardiovascular;  Laterality: Bilateral;  Common Iliac Arteries  . THYROIDECTOMY      Allergies as of 05/21/2020      Reactions   Septra [sulfamethoxazole-trimethoprim] Itching   Pt states she had a severe allergic reaction; she was in the ICU for 2 days because of taking this.   Ciprofloxacin Hcl Itching   Codeine Rash   Made her feel very strange      Medication List       Accurate as of May 21, 2020 11:59 PM. If you have any questions, ask your nurse or doctor.        acetaminophen 500 MG tablet Commonly known as: TYLENOL Take 500 mg by mouth 2 (two) times daily as needed (for pain.).   ALPRAZolam 0.25 MG tablet Commonly known as: XANAX Take 1-2 tablets (0.25-0.5 mg total) by mouth at bedtime as needed for sleep.   AMBULATORY NON FORMULARY MEDICATION Right Breast Prosthesis  Dx: Q22.29   aspirin EC 81 MG tablet Take 81 mg by mouth every evening.   atorvastatin 40 MG tablet Commonly known as: LIPITOR Take 1  tablet (40 mg total) by mouth daily.   felodipine 5 MG 24 hr tablet Commonly known as: PLENDIL Take 1 tablet (5 mg total) by mouth daily.   lisinopril 20 MG tablet Commonly known as: ZESTRIL Take 1 tablet (20 mg total) by mouth daily. What changed:   medication strength  how much to take  how to take this  when to take this  additional instructions Changed by: Kathlene November, MD   psyllium 28 % packet Commonly known as: METAMUCIL SMOOTH TEXTURE Take 1 packet by mouth 2 (two) times daily.   triamcinolone lotion 0.1  % Commonly known as: KENALOG Apply 1 application topically 3 (three) times daily.   VITAMIN B 12 PO Take 1,000 mg by mouth daily.          Objective:   Physical Exam BP 122/66 (BP Location: Left Arm, Patient Position: Sitting, Cuff Size: Large)   Pulse 86   Temp (!) 97 F (36.1 C) (Temporal)   Ht 5\' 5"  (1.651 m)   Wt 115 lb 12.8 oz (52.5 kg)   SpO2 97%   BMI 19.27 kg/m  General:   Well developed, NAD, BMI noted. HEENT:  Normocephalic . Face symmetric, atraumatic Lungs:  CTA B Normal respiratory effort, no intercostal retractions, no accessory muscle use. Heart: RRR,  no murmur.  Lower extremities: no pretibial edema bilaterally  Skin: Not pale. Not jaundice Neurologic:  alert & oriented X3.  Speech normal, gait appropriate for age and unassisted Psych--  Cognition and judgment appear intact.  Cooperative with normal attention span and concentration.  Behavior appropriate. No anxious or depressed appearing.      Assessment    Assessment  Prediabetes HTN Hyperlipidemia Insomnia-- rarely has xanax  COPD: smoker, XR suggestive of dx, no PFTs Osteoporosis 2003-- refused dexas consistently  B12 def (185 05-2013) CV: ---CVA 2006 ---Carotid artery dz--last [place] 09-2011 40-59% B- ICA, declined further testing ---AAA --last [place]  05-2015, 1 year -- PVD, + ABIs 10-2015, 11-2015: B iliac stents Shingles 10-2009 and ~ 07-2015 (seen elsewhere, L face) Status post thyroidectomy, transient hypothyroidism Breast cancer 1960 Allergies- hives sometimes , saw derm    PLAN Last visit 10-2019. Prediabetes: Check A1c.  She follows healthy diet. HTN: Was on benazepril for long time, manufacturer was changed, that created dizziness, cardiology recommended lisinopril 10 mg 2 tablets a day.  Reports no further side effects.  Ambulatory BPs are sometimes high at home, BP today is excellent, plan: Continue lisinopril 20 mg daily, Plendil, check a BMP. Subclinical hypothyroidism: Check  TSH Anxiety: Related to a family issue, listening therapy provided. Preventive care: Declines COVID booster, pro>>cons, d/w pt  Recommend Tdap Discussed healthcare power of attorney RTC 6 months    This visit occurred during the SARS-CoV-2 public health emergency.  Safety protocols were in place, including screening questions prior to the visit, additional usage of staff PPE, and extensive cleaning of exam room while observing appropriate contact time as indicated for disinfecting solutions.

## 2020-05-22 NOTE — Assessment & Plan Note (Signed)
Last visit 10-2019. Prediabetes: Check A1c.  She follows healthy diet. HTN: Was on benazepril for long time, manufacturer was changed, that created dizziness, cardiology recommended lisinopril 10 mg 2 tablets a day.  Reports no further side effects.  Ambulatory BPs are sometimes high at home, BP today is excellent, plan: Continue lisinopril 20 mg daily, Plendil, check a BMP. Subclinical hypothyroidism: Check TSH Anxiety: Related to a family issue, listening therapy provided. Preventive care: Declines COVID booster, pro>>cons, d/w pt  Recommend Tdap Discussed healthcare power of attorney RTC 6 months

## 2020-05-28 ENCOUNTER — Other Ambulatory Visit: Payer: Self-pay | Admitting: Cardiovascular Disease

## 2020-08-15 ENCOUNTER — Other Ambulatory Visit (HOSPITAL_COMMUNITY): Payer: Self-pay | Admitting: Cardiovascular Disease

## 2020-08-15 DIAGNOSIS — I739 Peripheral vascular disease, unspecified: Secondary | ICD-10-CM

## 2020-08-20 ENCOUNTER — Ambulatory Visit (HOSPITAL_BASED_OUTPATIENT_CLINIC_OR_DEPARTMENT_OTHER)
Admission: RE | Admit: 2020-08-20 | Discharge: 2020-08-20 | Disposition: A | Discharge status: Home / Self Care | Payer: Medicare Other | Source: Ambulatory Visit | Attending: Cardiology | Admitting: Cardiology

## 2020-08-20 ENCOUNTER — Ambulatory Visit (HOSPITAL_COMMUNITY)
Admission: RE | Admit: 2020-08-20 | Discharge: 2020-08-20 | Disposition: A | Discharge status: Home / Self Care | Payer: Medicare Other | Source: Ambulatory Visit | Attending: Cardiology | Admitting: Cardiology

## 2020-08-20 ENCOUNTER — Other Ambulatory Visit: Payer: Self-pay

## 2020-08-20 DIAGNOSIS — I739 Peripheral vascular disease, unspecified: Secondary | ICD-10-CM

## 2020-08-20 DIAGNOSIS — I714 Abdominal aortic aneurysm, without rupture, unspecified: Secondary | ICD-10-CM

## 2020-08-26 ENCOUNTER — Encounter: Payer: Self-pay | Admitting: Internal Medicine

## 2020-09-11 ENCOUNTER — Other Ambulatory Visit: Payer: Self-pay

## 2020-09-11 ENCOUNTER — Encounter: Payer: Self-pay | Admitting: Internal Medicine

## 2020-09-11 ENCOUNTER — Ambulatory Visit (INDEPENDENT_AMBULATORY_CARE_PROVIDER_SITE_OTHER): Payer: Medicare Other | Admitting: Internal Medicine

## 2020-09-11 VITALS — BP 122/60 | HR 72 | Temp 98.1°F | Resp 18 | Ht 65.0 in | Wt 113.0 lb

## 2020-09-11 DIAGNOSIS — R42 Dizziness and giddiness: Secondary | ICD-10-CM | POA: Diagnosis not present

## 2020-09-11 DIAGNOSIS — I10 Essential (primary) hypertension: Secondary | ICD-10-CM | POA: Diagnosis not present

## 2020-09-11 DIAGNOSIS — R5383 Other fatigue: Secondary | ICD-10-CM

## 2020-09-11 DIAGNOSIS — R008 Other abnormalities of heart beat: Secondary | ICD-10-CM

## 2020-09-11 NOTE — Patient Instructions (Signed)
Please come back in 4 weeks   If he has severe symptoms: Go to the ER  We will schedule MRI of the brain

## 2020-09-11 NOTE — Progress Notes (Signed)
Subjective:    Patient ID: Crystal Osborne, female    DOB: 09/26/1931, 87 y.o.   MRN: [id number]  DOS:  09/11/2020 Type of visit - description: Acute Symptoms a started approximately 6 to 8 weeks. She reports pain from the back distally to the buttocks and legs whenever she stands up and starts walking.  After 6 feet, she needs to stop and sits down. At the same time, every time she stands up she gets very dizzy, fortunately no recent falls. Again when she is sitting quietly she feels no dizziness or pain.  Denies chest pain, difficulty breathing.  Reports she has been feeling quite fatigued Occasional palpitations?. Denies headaches, diplopia, face numbness, face weakness. No nausea, vomiting, diarrhea.  No blood in the stools except for occasional "hemorrhoidal bleed".  Recently had ABIs and a aorta ultrasound, report reviewed  Review of Systems See above   Past Medical History:  Diagnosis Date   AAA (abdominal aortic aneurysm) [facility])    sees Dr Eden Lathe    B12 deficiency    Cancer of right breast [facility]) [place] 11/28/2008   sees Dr Oneida Alar    COPD (chronic obstructive pulmonary disease) ([place]) 01/25/2014   CVA (cerebrovascular accident) ([place]) 2006   Diabetes mellitus ([place]) 05/29/2010   A1c 6.0 on October 2011    Hyperlipidemia    Hypertension    Osteoporosis 2003   Pneumonia ~ 2014; ~ 2015   Prolapse of female bladder, acquired    Shingles    repeated episone 9/11 went to a UC   TIA (transient ischemic attack) ~ 2005   Transient hypothyroidism    d/t thyroidectomy   UTI (lower urinary tract infection) May 2013    Past Surgical History:  Procedure Laterality Date   ABDOMINAL HYSTERECTOMY     no oophorectomy   CATARACT EXTRACTION W/ INTRAOCULAR LENS  IMPLANT, BILATERAL Bilateral    DILATION AND CURETTAGE OF UTERUS     MASTECTOMY, RADICAL Right 1960s   remotely    PERIPHERAL VASCULAR CATHETERIZATION  12/03/2015   PERIPHERAL VASCULAR CATHETERIZATION  N/A 12/03/2015   Procedure: Abdominal Aortogram;  Surgeon: Lorretta Harp, MD;  Location: [place] [facility];  Service: Cardiovascular;  Laterality: N/A;   PERIPHERAL VASCULAR CATHETERIZATION Bilateral 12/03/2015   Procedure: Lower Extremity Angiography;  Surgeon: Lorretta Harp, MD;  Location: [place] [facility];  Service: Cardiovascular;  Laterality: Bilateral;   PERIPHERAL VASCULAR CATHETERIZATION Bilateral 12/03/2015   Procedure: Peripheral Vascular Intervention;  Surgeon: Lorretta Harp, MD;  Location: [place] [facility];  Service: Cardiovascular;  Laterality: Bilateral;  52mx58mm Lifestream bilateral Common Iliac Arteries   PERIPHERAL VASCULAR CATHETERIZATION Bilateral 12/03/2015   Procedure: Peripheral Vascular Atherectomy;  Surgeon: JLorretta Harp MD;  [facility];  Service: Cardiovascular;  Laterality: Bilateral;  Common Iliac Arteries   THYROIDECTOMY      Allergies as of 09/11/2020       Reactions   Septra [sulfamethoxazole-trimethoprim] Itching   Pt states she had a severe allergic reaction; she was in the ICU for 2 days because of taking this.   Ciprofloxacin Hcl Itching   Codeine Rash   Made her feel very strange        Medication List        Accurate as of September 11, 2020 11:59 PM. If you have any questions, ask your nurse or doctor.          acetaminophen 500 MG tablet Commonly known  as: TYLENOL Take 500 mg by mouth 2 (two) times daily as needed (for pain.).   ALPRAZolam 0.25 MG tablet Commonly known as: XANAX Take 1-2 tablets (0.25-0.5 mg total) by mouth at bedtime as needed for sleep.   AMBULATORY NON FORMULARY MEDICATION Right Breast Prosthesis  Dx: [id number]   aspirin EC 81 MG tablet Take 81 mg by mouth every evening.   atorvastatin 40 MG tablet Commonly known as: LIPITOR Take 1 tablet (40 mg total) by mouth daily.   felodipine 5 MG 24 hr tablet Commonly known as: PLENDIL Take 1 tablet (5 mg total) by mouth daily.    lisinopril 20 MG tablet Commonly known as: ZESTRIL Take 1 tablet (20 mg total) by mouth daily.   psyllium 28 % packet Commonly known as: METAMUCIL SMOOTH TEXTURE Take 1 packet by mouth 2 (two) times daily.   triamcinolone lotion 0.1 % Commonly known as: KENALOG Apply 1 application topically 3 (three) times daily.   VITAMIN B 12 PO Take 1,000 mg by mouth daily.           Objective:   Physical Exam BP 122/60 (BP Location: Left Arm, Patient Position: Sitting, Cuff Size: Small)   Pulse 72   Temp 98.1 F (36.7 C) (Oral)   Resp 18   Ht '5\' 5"'$  (1.651 m)   Wt 113 lb (51.3 kg)   SpO2 97%   BMI 18.80 kg/m  General:   Well developed, looks fatigued and chronically ill but not toxic.. Orthostatic vital signs: No significant drop in BP HEENT:  Normocephalic . Face symmetric, atraumatic Lungs:  CTA B Normal respiratory effort, no intercostal retractions, no accessory muscle use. Heart: Regular? Abdomen:  Not distended, soft, non-tender. No rebound or rigidity.   Skin: Not pale. Not jaundice Lower extremities: no pretibial edema bilaterally.  Toes are warm, good capillary refills. Neurologic:  alert & oriented X3.  Speech normal, gait slow and needed some assistance walking.  Needed help transferring. Psych--  Cognition and judgment appear intact.  Cooperative with normal attention span and concentration.  Behavior appropriate. No anxious or depressed appearing.     Assessment     Assessment  Prediabetes HTN Hyperlipidemia Insomnia-- rarely has xanax  COPD: smoker, XR suggestive of dx, no PFTs Osteoporosis 2003-- refused dexas consistently  B12 def (185 05-2013) CV: ---CVA 2006 ---Carotid artery dz--last [place] 09-2011 40-59% B- ICA, declined further testing ---AAA --last [place]  05-2015, 1 year -- PVD, + ABIs 10-2015, 11-2015: B iliac stents Shingles 10-2009 and ~ 07-2015 (seen elsewhere, L face) Status post thyroidectomy, transient hypothyroidism Breast cancer  1960 Allergies- hives sometimes , saw derm    PLAN Dizziness, fatigue, claudication. Symptoms a started 6 to 8 weeks ago. EKG today: PVCs with compensatory pauses, discussed with cardiology, appreciate their input, this is not likely to cause symptoms but they will see the patient this week for further eval Had ABIs and a aorta ultrasound on 08/20/2020: Mild disease R leg, moderate disease L leg.  Aorta and Iliac arteries [place]: See report, was recommended to recheck study in 12 months or vascular consult if clinically indicated.  Toes are warm seem well perfused today. She has a history of a previous stroke but no recent neurological symptoms except dizziness. Plan: To see cardiology CMP, CBC, TSH, magnesium MRI brain ER symptoms severe Social: Lives by herself, does not drive. RTC 4 weeks.    This visit occurred during the SARS-CoV-2 public health emergency.  Safety protocols were in place, including screening  questions prior to the visit, additional usage of staff PPE, and extensive cleaning of exam room while observing appropriate contact time as indicated for disinfecting solutions.

## 2020-09-12 LAB — MAGNESIUM: Magnesium: 1.9 mg/dL (ref 1.5–2.5)

## 2020-09-12 LAB — CBC WITH DIFFERENTIAL/PLATELET
Basophils Absolute: 0.1 10*3/uL (ref 0.0–0.1)
Basophils Relative: 1.1 % (ref 0.0–3.0)
Eosinophils Absolute: 0.1 10*3/uL (ref 0.0–0.7)
Eosinophils Relative: 0.8 % (ref 0.0–5.0)
HCT: 38 % (ref 36.0–46.0)
Hemoglobin: 12.5 g/dL (ref 12.0–15.0)
Lymphocytes Relative: 26.7 % (ref 12.0–46.0)
Lymphs Abs: 2.2 10*3/uL (ref 0.7–4.0)
MCHC: 32.9 g/dL (ref 30.0–36.0)
MCV: 91.5 fl (ref 78.0–100.0)
Monocytes Absolute: 0.6 10*3/uL (ref 0.1–1.0)
Monocytes Relative: 6.8 % (ref 3.0–12.0)
Neutro Abs: 5.4 10*3/uL (ref 1.4–7.7)
Neutrophils Relative %: 64.6 % (ref 43.0–77.0)
Platelets: 241 10*3/uL (ref 150.0–400.0)
RBC: 4.15 Mil/uL (ref 3.87–5.11)
RDW: 15.6 % — ABNORMAL HIGH (ref 11.5–15.5)
WBC: 8.3 10*3/uL (ref 4.0–10.5)

## 2020-09-12 LAB — COMPREHENSIVE METABOLIC PANEL
ALT: 10 U/L (ref 0–35)
AST: 16 U/L (ref 0–37)
Albumin: 3.8 g/dL (ref 3.5–5.2)
Alkaline Phosphatase: 94 U/L (ref 39–117)
BUN: 19 mg/dL (ref 6–23)
CO2: 24 mEq/L (ref 19–32)
Calcium: 9.2 mg/dL (ref 8.4–10.5)
Chloride: 104 mEq/L (ref 96–112)
Creatinine, Ser: 1.05 mg/dL (ref 0.40–1.20)
GFR: 47.88 mL/min — ABNORMAL LOW (ref >60.00)
Glucose, Bld: 88 mg/dL (ref 70–99)
Potassium: 4.8 mEq/L (ref 3.5–5.1)
Sodium: 138 mEq/L (ref 135–145)
Total Bilirubin: 0.6 mg/dL (ref 0.2–1.2)
Total Protein: 6.4 g/dL (ref 6.0–8.3)

## 2020-09-12 LAB — TSH: TSH: 3.46 u[IU]/mL (ref 0.35–5.50)

## 2020-09-12 NOTE — Assessment & Plan Note (Signed)
Dizziness, fatigue, claudication. Symptoms a started 6 to 8 weeks ago. EKG today: PVCs with compensatory pauses, discussed with cardiology, appreciate their input, this is not likely to cause symptoms but they will see the patient this week for further eval Had ABIs and a aorta ultrasound on 08/20/2020: Mild disease R leg, moderate disease L leg.  Aorta and Iliac arteries [place]: See report, was recommended to recheck study in 12 months or vascular consult if clinically indicated.  Toes are warm seem well perfused today. She has a history of a previous stroke but no recent neurological symptoms except dizziness. Plan: To see cardiology CMP, CBC, TSH, magnesium MRI brain ER symptoms severe Social: Lives by herself, does not drive. RTC 4 weeks.

## 2020-09-12 NOTE — Progress Notes (Signed)
Cardiology Office Note:    Date:  09/13/2020   ID:  Crystal Osborne, DOB 1931-09-17, MRN [id number]  PCP:  Colon Branch, MD  Cardiologist:  None  Electrophysiologist:  None   Referring MD: Colon Branch, MD   Chief Complaint  Patient presents with   Dizziness     History of Present Illness:    Crystal Osborne is a 87 y.o. female with a hx of PAD, tobacco use, hypertension, hyperlipidemia, TIA, T2DM, COPD, carotid artery disease presents for follow-up.  She follows with Dr. Gwenlyn Found.  Presents with dizziness.  Most recent echo 09/15/2016 showed EF 60 to [id number], grade 1 diastolic dysfunction, mild to moderate tricuspid regurgitation, mildly elevated pulmonary arterial systolic pressure.  She reports that she has been having episodes where she will have back pain, leg weakness, and dizziness.  Has felt close to passing out but denies any syncope.  She lies down when these episodes happen until they pass.  Reports palpitations during episodes, feels like heart is beating irregularly and fast.  Have been occurring several times per day.  She denies any chest pain, dyspnea, lower extremity edema.   BP Readings from Last 3 Encounters:  09/13/20 (!) 94/57  09/11/20 122/60  05/21/20 122/66     Past Medical History:  Diagnosis Date   AAA (abdominal aortic aneurysm) [facility])    sees Dr Eden Lathe    B12 deficiency    Cancer of right breast ([place]) [place] 11/28/2008   sees Dr Oneida Alar    COPD (chronic obstructive pulmonary disease) ([place]) 01/25/2014   CVA (cerebrovascular accident) ([place]) 2006   Diabetes mellitus ([place]) 05/29/2010   A1c 6.0 on October 2011    Hyperlipidemia    Hypertension    Osteoporosis 2003   Pneumonia ~ 2014; ~ 2015   Prolapse of female bladder, acquired    Shingles    repeated episone 9/11 went to a UC   TIA (transient ischemic attack) ~ 2005   Transient hypothyroidism    d/t thyroidectomy   UTI (lower urinary tract infection) May 2013    Past Surgical  History:  Procedure Laterality Date   ABDOMINAL HYSTERECTOMY     no oophorectomy   CATARACT EXTRACTION W/ INTRAOCULAR LENS  IMPLANT, BILATERAL Bilateral    DILATION AND CURETTAGE OF UTERUS     MASTECTOMY, RADICAL Right 1960s   remotely    PERIPHERAL VASCULAR CATHETERIZATION  12/03/2015   PERIPHERAL VASCULAR CATHETERIZATION N/A 12/03/2015   Procedure: Abdominal Aortogram;  Surgeon: Lorretta Harp, MD;  Location: [place] [facility];  Service: Cardiovascular;  Laterality: N/A;   PERIPHERAL VASCULAR CATHETERIZATION Bilateral 12/03/2015   Procedure: Lower Extremity Angiography;  Surgeon: Lorretta Harp, MD;  Location: [place] [facility];  Service: Cardiovascular;  Laterality: Bilateral;   PERIPHERAL VASCULAR CATHETERIZATION Bilateral 12/03/2015   Procedure: Peripheral Vascular Intervention;  Surgeon: Lorretta Harp, MD;  Location: [place] [facility];  Service: Cardiovascular;  Laterality: Bilateral;  3mx58mm [place] bilateral Common Iliac Arteries   PERIPHERAL VASCULAR CATHETERIZATION Bilateral 12/03/2015   Procedure: Peripheral Vascular Atherectomy;  Surgeon: JLorretta Harp MD;  [facility];  Service: Cardiovascular;  Laterality: Bilateral;  Common Iliac Arteries   THYROIDECTOMY      Current Medications: Current Meds  Medication Sig   acetaminophen (TYLENOL) 500 MG tablet Take 500 mg by mouth 2 (two) times daily as needed (for pain.).   AMBULATORY NON FORMULARY MEDICATION Right Breast Prosthesis  Dx:  V15.29   aspirin EC 81 MG tablet Take 81 mg by mouth every evening.   atorvastatin (LIPITOR) 40 MG tablet Take 1 tablet (40 mg total) by mouth daily.   Cyanocobalamin (VITAMIN B 12 PO) Take 1,000 mg by mouth daily.   lisinopril (ZESTRIL) 20 MG tablet Take 1 tablet (20 mg total) by mouth daily.   psyllium (METAMUCIL SMOOTH TEXTURE) 28 % packet Take 1 packet by mouth 2 (two) times daily. Patient takes 1 packet every other day.   [DISCONTINUED] felodipine (PLENDIL) 5  MG 24 hr tablet Take 1 tablet (5 mg total) by mouth daily.     Allergies:   Septra [sulfamethoxazole-trimethoprim], Ciprofloxacin hcl, and Codeine   Social History   Socioeconomic History   Marital status: Divorced    Spouse name: Not on file   Number of children: 3   Years of education: Not on file   Highest education level: Not on file  Occupational History   Occupation: retired   Tobacco Use   Smoking status: Every Day    Packs/day: 0.50    Years: 67.00    Pack years: 33.50    Types: Cigarettes   Smokeless tobacco: Never   Tobacco comments:    1/2 ppd  Substance and Sexual Activity   Alcohol use: No    Alcohol/week: 0.0 standard drinks   Drug use: No   Sexual activity: Never  Other Topics Concern   Not on file  Social History Narrative   Lives by herself , still drives some    Has 3 children, 1 living son    Emergency contact: Crystal Osborne [phone], he lives in the [place] area   Lost a son ~ 2017, he had  lung ca, stroke (09-2011)    Lost another son Crystal Osborne) 02/15/2019     Lost a sister died 67-15           Social Determinants of Health   Financial Resource Strain: Not on file  Food Insecurity: Not on file  Transportation Needs: Not on file  Physical Activity: Not on file  Stress: Not on file  Social Connections: Not on file     Family History: The patient's family history includes Cancer in an other family member. There is no history of Colon cancer, Coronary artery disease, Diabetes type II, or Breast cancer.  ROS:   Please see the history of present illness.     All other systems reviewed and are negative.  EKGs/Labs/Other Studies Reviewed:    The following studies were reviewed today:   EKG:  EKG is  ordered today.  The ekg ordered today demonstrates sinus rhythm with frequent PVCs, LVH with repolarization abnormalities, rate 73  Recent Labs: 09/11/2020: ALT 10; BUN 19; Creatinine, Ser 1.05; Hemoglobin 12.5; Magnesium 1.9; Platelets 241.0; Potassium  4.8; Sodium 138; TSH 3.46  Recent Lipid Panel    Component Value Date/Time   CHOL 134 10/24/2019 1154   TRIG 117 10/24/2019 1154   TRIG 112 01/06/2006 0841   HDL 51 10/24/2019 1154   CHOLHDL 2.6 10/24/2019 1154   VLDL 21.8 07/19/2018 0919   LDLCALC 63 10/24/2019 1154   LDLDIRECT 149.2 08/03/2006 1054    Physical Exam:    VS:  BP (!) 94/57 (BP Location: Left Arm, Patient Position: Sitting, Cuff Size: Normal)   Pulse 73   Ht '5\' 5"'$  (1.651 m)   Wt 109 lb 6.4 oz (49.6 kg)   SpO2 97%   BMI 18.21 kg/m  Wt Readings from Last 3 Encounters:  09/13/20 109 lb 6.4 oz (49.6 kg)  09/11/20 113 lb (51.3 kg)  05/21/20 115 lb 12.8 oz (52.5 kg)     GEN:  Well nourished, well developed in no acute distress HEENT: Normal NECK: No JVD CARDIAC: RRR, no murmurs, rubs, gallops RESPIRATORY:  Clear to auscultation without rales, wheezing or rhonchi  ABDOMEN: Soft, non-tender, non-distended MUSCULOSKELETAL:  No edema; No deformity  SKIN: Warm and dry NEUROLOGIC:  Alert and oriented x 3 PSYCHIATRIC:  Normal affect   ASSESSMENT:    1. Pre-syncope   2. PVC's (premature ventricular contractions)   3. Essential hypertension    PLAN:    Near syncope/palpitations: Description concerning for arrhythmia, will evaluate with Zio patch x3 days.  EKG in clinic today with frequent PVCs, will quantify PVC burden with Zio.  Will check echocardiogram to evaluate for structural heart disease.  BP is low in clinic today, suspect this is contributing to her lightheadedness, will discontinue felodipine.  CMET, CBC, TSH from 8/2 unremarkable  Hypertension: On felodipine and lisinopril.  BP 94/57 in clinic today, will discontinue felodipine as above.  Asked patient to check BP twice daily or when lightheaded for next week and call with results, may have to back off lisinopril as well if BP remains low   Follow-up with Dr. Gwenlyn Found or APP in 6 weeks   Medication Adjustments/Labs and Tests Ordered: Current  medicines are reviewed at length with the patient today.  Concerns regarding medicines are outlined above.  Orders Placed This Encounter  Procedures   LONG TERM MONITOR (3-14 DAYS)   EKG 12-Lead   ECHOCARDIOGRAM COMPLETE    No orders of the defined types were placed in this encounter.   Patient Instructions  Medication Instructions:  STOP: FELODIPINE  *If you need a refill on your cardiac medications before your next appointment, please call your pharmacy*  Testing/Procedures: Your physician has requested that you have an echocardiogram. Echocardiography is a painless test that uses sound waves to create images of your heart. It provides your doctor with information about the size and shape of your heart and how well your heart's chambers and valves are working. You may receive an ultrasound enhancing agent through an IV if needed to better visualize your heart during the echo.This procedure takes approximately one hour. There are no restrictions for this procedure. This will take place at the [address].    ZIO XT- Long Term Monitor Instructions   Your physician has requested you wear your ZIO patch monitor 3 days.   This is a single patch monitor.  [employer] supplies one patch monitor per enrollment.  Additional stickers are not available.   Please do not apply patch if you will be having a Nuclear Stress Test, Echocardiogram, Cardiac CT, MRI, or Chest Xray during the time frame you would be wearing the monitor. The patch cannot be worn during these tests.  You cannot remove and re-apply the ZIO XT patch monitor.   Your ZIO patch monitor will be sent USPS Priority mail from [facility] directly to your home address. The monitor may also be mailed to a PO BOX if home delivery is not available.   It may take 3-5 days to receive your monitor after you have been enrolled.   Once you have received you monitor, please review enclosed instructions.  Your monitor has  already been registered assigning a specific monitor serial # to you.   Applying the  monitor   Shave hair from upper left chest.   Hold abrader disc by orange tab.  Rub abrader in 40 strokes over left upper chest as indicated in your monitor instructions.   Clean area with 4 enclosed alcohol pads .  Use all pads to assure are is cleaned thoroughly.  Let dry.   Apply patch as indicated in monitor instructions.  Patch will be place under collarbone on left side of chest with arrow pointing upward.   Rub patch adhesive wings for 2 minutes.Remove white label marked "1".  Remove white label marked "2".  Rub patch adhesive wings for 2 additional minutes.   While looking in a mirror, press and release button in center of patch.  A small green light will flash 3-4 times .  This will be your only indicator the monitor has been turned on.     Do not shower for the first 24 hours.  You may shower after the first 24 hours.   Press button if you feel a symptom. You will hear a small click.  Record Date, Time and Symptom in the Patient Log Book.   When you are ready to remove patch, follow instructions on last 2 pages of Patient Log Book.  Stick patch monitor onto last page of Patient Log Book.   Place Patient Log Book in [place] box.  Use locking tab on box and tape box closed securely.  The [employer] and [employer] has [employer] on it.  Please place in mailbox as soon as possible.  Your physician should have your test results approximately 7 days after the monitor has been mailed back to [facility].   Call [place] at [phone] if you have questions regarding your ZIO XT patch monitor.  Call them immediately if you see an orange light blinking on your monitor.   If your monitor falls off in less than 4 days contact our Monitor department at [phone].  If your monitor becomes loose or falls off after 4 days call [employer] at [phone] for suggestions on securing your  monitor.     Follow-Up: At [facility], you and your health needs are our priority.  As part of our continuing mission to provide you with exceptional heart care, we have created designated Provider Care Teams.  These Care Teams include your primary Cardiologist (physician) and Advanced Practice Providers (APPs -  Physician Assistants and Nurse Practitioners) who all work together to provide you with the care you need, when you need it.  Your next appointment:   6 week(s)  The format for your next appointment:   In Person  Provider:   You may see Dr. Gwenlyn Found or one of the following Advanced Practice Providers on your designated Care Team:   [place], PA-C Coletta Memos, FNP  Other Instructions Please check your blood pressure at home twice daily, write it down.  Call the office or send message via Mychart with the readings in 1 week for Dr. Gardiner Rhyme to review. Check BP if you are lightheaded- write that down and keep log as well. Call to let us know.     Signed, Donato Heinz, MD  09/13/2020 12:12 PM    [place]

## 2020-09-13 ENCOUNTER — Other Ambulatory Visit: Payer: Self-pay

## 2020-09-13 ENCOUNTER — Encounter: Payer: Self-pay | Admitting: Cardiology

## 2020-09-13 ENCOUNTER — Ambulatory Visit (INDEPENDENT_AMBULATORY_CARE_PROVIDER_SITE_OTHER): Payer: Medicare Other | Admitting: Cardiology

## 2020-09-13 ENCOUNTER — Ambulatory Visit (INDEPENDENT_AMBULATORY_CARE_PROVIDER_SITE_OTHER): Payer: Medicare Other

## 2020-09-13 VITALS — BP 94/57 | HR 73 | Ht 65.0 in | Wt 109.4 lb

## 2020-09-13 DIAGNOSIS — R55 Syncope and collapse: Secondary | ICD-10-CM

## 2020-09-13 DIAGNOSIS — I493 Ventricular premature depolarization: Secondary | ICD-10-CM | POA: Diagnosis not present

## 2020-09-13 DIAGNOSIS — I10 Essential (primary) hypertension: Secondary | ICD-10-CM | POA: Diagnosis not present

## 2020-09-13 NOTE — Patient Instructions (Signed)
Medication Instructions:  STOP: FELODIPINE  *If you need a refill on your cardiac medications before your next appointment, please call your pharmacy*  Testing/Procedures: Your physician has requested that you have an echocardiogram. Echocardiography is a painless test that uses sound waves to create images of your heart. It provides your doctor with information about the size and shape of your heart and how well your heart's chambers and valves are working. You may receive an ultrasound enhancing agent through an IV if needed to better visualize your heart during the echo.This procedure takes approximately one hour. There are no restrictions for this procedure. This will take place at the [address].    ZIO XT- Long Term Monitor Instructions   Your physician has requested you wear your ZIO patch monitor 3 days.   This is a single patch monitor.  [employer] supplies one patch monitor per enrollment.  Additional stickers are not available.   Please do not apply patch if you will be having a Nuclear Stress Test, Echocardiogram, Cardiac CT, MRI, or Chest Xray during the time frame you would be wearing the monitor. The patch cannot be worn during these tests.  You cannot remove and re-apply the ZIO XT patch monitor.   Your ZIO patch monitor will be sent USPS Priority mail from [facility] directly to your home address. The monitor may also be mailed to a PO BOX if home delivery is not available.   It may take 3-5 days to receive your monitor after you have been enrolled.   Once you have received you monitor, please review enclosed instructions.  Your monitor has already been registered assigning a specific monitor serial # to you.   Applying the monitor   Shave hair from upper left chest.   Hold abrader disc by orange tab.  Rub abrader in 40 strokes over left upper chest as indicated in your monitor instructions.   Clean area with 4 enclosed alcohol pads .  Use all pads  to assure are is cleaned thoroughly.  Let dry.   Apply patch as indicated in monitor instructions.  Patch will be place under collarbone on left side of chest with arrow pointing upward.   Rub patch adhesive wings for 2 minutes.Remove white label marked "1".  Remove white label marked "2".  Rub patch adhesive wings for 2 additional minutes.   While looking in a mirror, press and release button in center of patch.  A small green light will flash 3-4 times .  This will be your only indicator the monitor has been turned on.     Do not shower for the first 24 hours.  You may shower after the first 24 hours.   Press button if you feel a symptom. You will hear a small click.  Record Date, Time and Symptom in the Patient Log Book.   When you are ready to remove patch, follow instructions on last 2 pages of Patient Log Book.  Stick patch monitor onto last page of Patient Log Book.   Place Patient Log Book in [place] box.  Use locking tab on box and tape box closed securely.  The [employer] has [employer] on it.  Please place in mailbox as soon as possible.  Your physician should have your test results approximately 7 days after the monitor has been mailed back to [facility].   Call [place] at [phone] if you have questions regarding your ZIO XT patch  monitor.  Call them immediately if you see an orange light blinking on your monitor.   If your monitor falls off in less than 4 days contact our Monitor department at [phone].  If your monitor becomes loose or falls off after 4 days call [employer] at [phone] for suggestions on securing your monitor.     Follow-Up: At [facility], you and your health needs are our priority.  As part of our continuing mission to provide you with exceptional heart care, we have created designated Provider Care Teams.  These Care Teams include your primary Cardiologist (physician) and Advanced Practice Providers (APPs -   Physician Assistants and Nurse Practitioners) who all work together to provide you with the care you need, when you need it.  Your next appointment:   6 week(s)  The format for your next appointment:   In Person  Provider:   You may see Dr. Gwenlyn Found or one of the following Advanced Practice Providers on your designated Care Team:   [place], PA-C Coletta Memos, FNP  Other Instructions Please check your blood pressure at home twice daily, write it down.  Call the office or send message via Mychart with the readings in 1 week for Dr. Gardiner Rhyme to review. Check BP if you are lightheaded- write that down and keep log as well. Call to let us know.

## 2020-09-13 NOTE — Progress Notes (Unsigned)
Enrolled patient for a 3 day Zio XT monitor to be mailed to patients home  

## 2020-09-16 DIAGNOSIS — I493 Ventricular premature depolarization: Secondary | ICD-10-CM | POA: Diagnosis not present

## 2020-09-16 DIAGNOSIS — R55 Syncope and collapse: Secondary | ICD-10-CM | POA: Diagnosis not present

## 2020-09-22 ENCOUNTER — Ambulatory Visit (HOSPITAL_BASED_OUTPATIENT_CLINIC_OR_DEPARTMENT_OTHER)
Admission: RE | Admit: 2020-09-22 | Discharge: 2020-09-22 | Disposition: A | Discharge status: Home / Self Care | Payer: Medicare Other | Source: Ambulatory Visit | Attending: Internal Medicine | Admitting: Internal Medicine

## 2020-09-22 ENCOUNTER — Other Ambulatory Visit: Payer: Self-pay

## 2020-09-22 DIAGNOSIS — R008 Other abnormalities of heart beat: Secondary | ICD-10-CM | POA: Diagnosis not present

## 2020-09-22 DIAGNOSIS — R42 Dizziness and giddiness: Secondary | ICD-10-CM | POA: Diagnosis not present

## 2020-09-22 DIAGNOSIS — I10 Essential (primary) hypertension: Secondary | ICD-10-CM | POA: Diagnosis not present

## 2020-09-22 DIAGNOSIS — R270 Ataxia, unspecified: Secondary | ICD-10-CM | POA: Diagnosis not present

## 2020-09-24 DIAGNOSIS — I493 Ventricular premature depolarization: Secondary | ICD-10-CM | POA: Diagnosis not present

## 2020-09-24 DIAGNOSIS — R55 Syncope and collapse: Secondary | ICD-10-CM | POA: Diagnosis not present

## 2020-09-26 ENCOUNTER — Other Ambulatory Visit: Payer: Self-pay

## 2020-09-26 ENCOUNTER — Encounter (HOSPITAL_COMMUNITY): Payer: Self-pay

## 2020-09-26 ENCOUNTER — Emergency Department (HOSPITAL_COMMUNITY)
Admission: EM | Admit: 2020-09-26 | Discharge: 2020-09-26 | Disposition: A | Discharge status: Home / Self Care | Payer: Medicare Other | Attending: Emergency Medicine | Admitting: Emergency Medicine

## 2020-09-26 DIAGNOSIS — R000 Tachycardia, unspecified: Secondary | ICD-10-CM | POA: Diagnosis not present

## 2020-09-26 DIAGNOSIS — Z853 Personal history of malignant neoplasm of breast: Secondary | ICD-10-CM | POA: Diagnosis not present

## 2020-09-26 DIAGNOSIS — F17210 Nicotine dependence, cigarettes, uncomplicated: Secondary | ICD-10-CM | POA: Insufficient documentation

## 2020-09-26 DIAGNOSIS — I498 Other specified cardiac arrhythmias: Secondary | ICD-10-CM

## 2020-09-26 DIAGNOSIS — J449 Chronic obstructive pulmonary disease, unspecified: Secondary | ICD-10-CM | POA: Diagnosis not present

## 2020-09-26 DIAGNOSIS — M25511 Pain in right shoulder: Secondary | ICD-10-CM | POA: Diagnosis not present

## 2020-09-26 DIAGNOSIS — Z79899 Other long term (current) drug therapy: Secondary | ICD-10-CM | POA: Diagnosis not present

## 2020-09-26 DIAGNOSIS — Z7982 Long term (current) use of aspirin: Secondary | ICD-10-CM | POA: Diagnosis not present

## 2020-09-26 DIAGNOSIS — R079 Chest pain, unspecified: Secondary | ICD-10-CM | POA: Diagnosis not present

## 2020-09-26 DIAGNOSIS — R001 Bradycardia, unspecified: Secondary | ICD-10-CM | POA: Diagnosis not present

## 2020-09-26 DIAGNOSIS — R6889 Other general symptoms and signs: Secondary | ICD-10-CM | POA: Diagnosis not present

## 2020-09-26 DIAGNOSIS — I10 Essential (primary) hypertension: Secondary | ICD-10-CM | POA: Insufficient documentation

## 2020-09-26 DIAGNOSIS — I499 Cardiac arrhythmia, unspecified: Secondary | ICD-10-CM | POA: Diagnosis not present

## 2020-09-26 DIAGNOSIS — Z743 Need for continuous supervision: Secondary | ICD-10-CM | POA: Diagnosis not present

## 2020-09-26 DIAGNOSIS — R002 Palpitations: Secondary | ICD-10-CM | POA: Diagnosis not present

## 2020-09-26 LAB — BASIC METABOLIC PANEL
Anion gap: 8 (ref 5–15)
BUN: 12 mg/dL (ref 8–23)
CO2: 24 mmol/L (ref 22–32)
Calcium: 8.7 mg/dL — ABNORMAL LOW (ref 8.9–10.3)
Chloride: 108 mmol/L (ref 98–111)
Creatinine, Ser: 1.13 mg/dL — ABNORMAL HIGH (ref 0.44–1.00)
GFR, Estimated: 47 mL/min — ABNORMAL LOW (ref >60)
Glucose, Bld: 119 mg/dL — ABNORMAL HIGH (ref 70–99)
Potassium: 4.8 mmol/L (ref 3.5–5.1)
Sodium: 140 mmol/L (ref 135–145)

## 2020-09-26 LAB — CBC WITH DIFFERENTIAL/PLATELET
Abs Immature Granulocytes: 0.03 10*3/uL (ref 0.00–0.07)
Basophils Absolute: 0.1 10*3/uL (ref 0.0–0.1)
Basophils Relative: 1 %
Eosinophils Absolute: 0.1 10*3/uL (ref 0.0–0.5)
Eosinophils Relative: 1 %
HCT: 41.9 % (ref 36.0–46.0)
Hemoglobin: 13.4 g/dL (ref 12.0–15.0)
Immature Granulocytes: 0 %
Lymphocytes Relative: 24 %
Lymphs Abs: 2.4 10*3/uL (ref 0.7–4.0)
MCH: 30.8 pg (ref 26.0–34.0)
MCHC: 32 g/dL (ref 30.0–36.0)
MCV: 96.3 fL (ref 80.0–100.0)
Monocytes Absolute: 0.8 10*3/uL (ref 0.1–1.0)
Monocytes Relative: 8 %
Neutro Abs: 6.5 10*3/uL (ref 1.7–7.7)
Neutrophils Relative %: 66 %
Platelets: 231 10*3/uL (ref 150–400)
RBC: 4.35 MIL/uL (ref 3.87–5.11)
RDW: 14.9 % (ref 11.5–15.5)
WBC: 9.8 10*3/uL (ref 4.0–10.5)
nRBC: 0 % (ref 0.0–0.2)

## 2020-09-26 MED ORDER — SODIUM CHLORIDE 0.9 % IV SOLN: INTRAVENOUS | Status: DC

## 2020-09-26 MED ORDER — HYDROCODONE-ACETAMINOPHEN 5-325 MG PO TABS: 1.0000 | ORAL_TABLET | Freq: Four times a day (QID) | ORAL | 0 refills | Status: DC | PRN

## 2020-09-26 NOTE — Discharge Instructions (Addendum)
Call your cardiologist tomorrow due to your heart rate being in bigem[place].  The medication for pain as directed

## 2020-09-26 NOTE — ED Provider Notes (Signed)
[facility] EMERGENCY DEPARTMENT Provider Note   CSN: [id number] Arrival date & time: 09/26/20  2103     History Chief Complaint  Patient presents with   Bradycardia    Crystal Osborne is a 87 y.o. female.  87 year old female presents due to bradycardia that was seen at the urgent care center.  Patient went there because she had 3 days of constant right shoulder pain that was sore for certain positions.  Denies any history of trauma to the shoulder.  States has had history of irregular heartbeat.  She currently is wearing an event monitor.  Denies any chest pain or shortness of breath with this.  No syncope or near syncope.  EMS called and patient transported here      Past Medical History:  Diagnosis Date   AAA (abdominal aortic aneurysm) [facility])    sees Dr Eden Lathe    B12 deficiency    Cancer of right breast [facility] [place] 11/28/2008   sees Dr Oneida Alar    COPD (chronic obstructive pulmonary disease) ([place]) 01/25/2014   CVA (cerebrovascular accident) ([place]) 2006   Diabetes mellitus ([place]) 05/29/2010   A1c 6.0 on October 2011    Hyperlipidemia    Hypertension    Osteoporosis 2003   Pneumonia ~ 2014; ~ 2015   Prolapse of female bladder, acquired    Shingles    repeated episone 9/11 went to a UC   TIA (transient ischemic attack) ~ 2005   Transient hypothyroidism    d/t thyroidectomy   UTI (lower urinary tract infection) May 2013    Patient Active Problem List   Diagnosis Date Noted   Shingles 07/29/2017   Syncope 07/25/2016   Claudication ([place]) 12/03/2015   PCP NOTES >>>>> 01/10/2015   COPD (chronic obstructive pulmonary disease) ([place]) 01/25/2014   B12 deficiency 01/25/2014   Anxiety and depression 07/26/2012   Insomnia 08/12/2011   Annual physical exam 05/29/2010   Hyperglycemia 05/29/2010   Diarrhea, chronic , on-off 05/29/2010   TOBACCO ABUSE 11/15/2009   Disorder of bladder 09/25/2009   SHOULDER PAIN 04/10/2009   PERSONAL HX  BREAST CANCER 12/12/2008   Carotid artery disease ([place]) 11/28/2008   PVD (peripheral vascular disease) ([place]) 04/18/2008   BLIND LOOP SYNDROME 11/09/2007   DIVERTICULOSIS, COLON 10/14/2007   COLONIC POLYPS 08/03/2006   Essential hypertension 08/03/2006   CVA 08/03/2006   OSTEOPOROSIS NOS 08/03/2006   THYROIDECTOMY, HX OF 08/03/2006   Hyperlipidemia 03/05/2006   Abdominal aortic aneurysm ([place]) 03/05/2006    Past Surgical History:  Procedure Laterality Date   ABDOMINAL HYSTERECTOMY     no oophorectomy   CATARACT EXTRACTION W/ INTRAOCULAR LENS  IMPLANT, BILATERAL Bilateral    DILATION AND CURETTAGE OF UTERUS     MASTECTOMY, RADICAL Right 1960s   remotely    PERIPHERAL VASCULAR CATHETERIZATION  12/03/2015   PERIPHERAL VASCULAR CATHETERIZATION N/A 12/03/2015   Procedure: Abdominal Aortogram;  Surgeon: Lorretta Harp, MD;  Location: [place] [facility];  Service: Cardiovascular;  Laterality: N/A;   PERIPHERAL VASCULAR CATHETERIZATION Bilateral 12/03/2015   Procedure: Lower Extremity Angiography;  Surgeon: Lorretta Harp, MD;  Location: [place] [facility];  Service: Cardiovascular;  Laterality: Bilateral;   PERIPHERAL VASCULAR CATHETERIZATION Bilateral 12/03/2015   Procedure: Peripheral Vascular Intervention;  Surgeon: Lorretta Harp, MD;  Location: [place] [facility];  Service: Cardiovascular;  Laterality: Bilateral;  30mx58mm Lifestream bilateral Common Iliac Arteries   PERIPHERAL VASCULAR CATHETERIZATION Bilateral 12/03/2015   Procedure: Peripheral  Vascular Atherectomy;  Surgeon: Lorretta Harp, MD;  Location: [place] [facility];  Service: Cardiovascular;  Laterality: Bilateral;  Common Iliac Arteries   THYROIDECTOMY       OB History   No obstetric history on file.     Family History  Problem Relation Age of Onset   Cancer Other        sister (lung) , brother (mouth)   Colon cancer Neg Hx    Coronary artery disease Neg Hx    Diabetes type II Neg Hx    Breast cancer Neg  Hx     Social History   Tobacco Use   Smoking status: Every Day    Packs/day: 0.50    Years: 67.00    Pack years: 33.50    Types: Cigarettes   Smokeless tobacco: Never   Tobacco comments:    1/2 ppd  Substance Use Topics   Alcohol use: No    Alcohol/week: 0.0 standard drinks   Drug use: No    Home Medications Prior to Admission medications   Medication Sig Start Date End Date Taking? Authorizing Provider  acetaminophen (TYLENOL) 500 MG tablet Take 500 mg by mouth 2 (two) times daily as needed (for pain.).    [provider]  ALPRAZolam Duanne Moron) 0.25 MG tablet Take 1-2 tablets (0.25-0.5 mg total) by mouth at bedtime as needed for sleep. Patient not taking: No sig reported 07/07/18   Colon Branch, MD  AMBULATORY NON FORMULARY MEDICATION Right Breast Prosthesis  Dx: V15.29 09/29/12   Colon Branch, MD  aspirin EC 81 MG tablet Take 81 mg by mouth every evening.    [provider]  atorvastatin (LIPITOR) 40 MG tablet Take 1 tablet (40 mg total) by mouth daily. 05/03/20   Colon Branch, MD  Cyanocobalamin (VITAMIN B 12 PO) Take 1,000 mg by mouth daily.    [provider]  lisinopril (ZESTRIL) 20 MG tablet Take 1 tablet (20 mg total) by mouth daily. 05/21/20   Colon Branch, MD  psyllium (METAMUCIL SMOOTH TEXTURE) 28 % packet Take 1 packet by mouth 2 (two) times daily. Patient takes 1 packet every other day.    [provider]    Allergies    Septra [sulfamethoxazole-trimethoprim], Ciprofloxacin hcl, and Codeine  Review of Systems   Review of Systems  All other systems reviewed and are negative.  Physical Exam Updated Vital Signs BP (!) 159/63   Pulse (!) 57   Temp (!) 97.5 F (36.4 C) (Oral)   Ht 1.651 m ('5\' 5"'$ )   Wt 49.6 kg   SpO2 97%   BMI 18.20 kg/m   Physical Exam Vitals and nursing note reviewed.  Constitutional:      General: She is not in acute distress.    Appearance: Normal appearance. She is well-developed. She is not  toxic-appearing.  HENT:     Head: Normocephalic and atraumatic.  Eyes:     General: Lids are normal.     Conjunctiva/sclera: Conjunctivae normal.     Pupils: Pupils are equal, round, and reactive to light.  Neck:     Thyroid: No thyroid mass.     Trachea: No tracheal deviation.  Cardiovascular:     Rate and Rhythm: Bradycardia present. Rhythm irregular.     Heart sounds: Normal heart sounds. No murmur heard.   No gallop.  Pulmonary:     Effort: Pulmonary effort is normal. No respiratory distress.     Breath sounds: Normal breath sounds.  No stridor. No decreased breath sounds, wheezing, rhonchi or rales.  Abdominal:     General: There is no distension.     Palpations: Abdomen is soft.     Tenderness: There is no abdominal tenderness. There is no rebound.  Musculoskeletal:        General: No tenderness. Normal range of motion.     Cervical back: Normal range of motion and neck supple.     Comments: Range of motion of patient's right shoulder.  Neurovascular intact to right hand  Skin:    General: Skin is warm and dry.     Findings: No abrasion or rash.  Neurological:     Mental Status: She is alert and oriented to person, place, and time. Mental status is at baseline.     GCS: GCS eye subscore is 4. GCS verbal subscore is 5. GCS motor subscore is 6.     Cranial Nerves: Cranial nerves are intact. No cranial nerve deficit.     Sensory: No sensory deficit.     Motor: Motor function is intact.  Psychiatric:        Attention and Perception: Attention normal.        Speech: Speech normal.        Behavior: Behavior normal.    ED Results / Procedures / Treatments   Labs (all labs ordered are listed, but only abnormal results are displayed) Labs Reviewed  CBC WITH DIFFERENTIAL/PLATELET  BASIC METABOLIC PANEL    EKG EKG Interpretation  Date/Time:  Wednesday September 26 2020 21:14:35 EDT Ventricular Rate:  94 PR Interval:  123 QRS Duration: 99 QT Interval:  367 QTC  Calculation: 358 R Axis:   59 Text Interpretation: Sinus rhythm Ventricular bigeminy Probable left atrial enlargement LVH with secondary repolarization abnormality Confirmed by Lacretia Leigh ([id number]) on 09/26/2020 9:20:13 PM  Radiology No results found.  Procedures Procedures   Medications Ordered in ED Medications  0.9 %  sodium chloride infusion (has no administration in time range)    ED Course  I have reviewed the triage vital signs and the nursing notes.  Pertinent labs & imaging results that were available during my care of the patient were reviewed by me and considered in my medical decision making (see chart for details).    MDM Rules/Calculators/A&P                          Labs are reviewed during here.  Patient is in bigeminy.  Discussed with cardiology on-call who felt that patient will need the results of her 3-day monitor before being started on medication.  Patient's right shoulder is with full range of motion.  She does not have any symptoms at this time.  Will prescribe short course of hydrocodone in case her pain does come back.  No indication for imaging at this time.  Patient to follow-up with cardiology.  Final Clinical Impression(s) / ED Diagnoses Final diagnoses:  None    Rx / DC Orders ED Discharge Orders     None        Lacretia Leigh, MD 09/26/20 2236

## 2020-09-26 NOTE — ED Triage Notes (Signed)
Brought in by [facility] - Coming from urgent care - pt was found bradycardic and on bigeminy. AO4. Denies any chest pain and SOB.  G18 left ac.

## 2020-09-27 ENCOUNTER — Telehealth: Payer: Self-pay | Admitting: Cardiovascular Disease

## 2020-09-27 NOTE — Telephone Encounter (Signed)
Spoke to patient. She states the ER wanted her to call office. She states she went to the ER because her right shoulder was hurting for 3 days. Patient states she not having issue other than shoulder bothering her.   She states ER thought she may need a referral to orthopedic doctor. RN informed patient primary would do referral if needed. Patient has an appointment with primary on 10/08/20.  She is in agreement to move her appointment up to 10/04/20 with Odie Sera NP.  Patient is aware   3 day monitor is completed and ready for review . Patient will have echo on 10/01/20 for review as well.

## 2020-09-27 NOTE — Telephone Encounter (Signed)
   Pt was in the hospital last night and was told by the doctor to call Dr. Gwenlyn Found

## 2020-10-01 ENCOUNTER — Ambulatory Visit (HOSPITAL_COMMUNITY): Payer: Medicare Other | Attending: Cardiology

## 2020-10-01 ENCOUNTER — Other Ambulatory Visit: Payer: Self-pay

## 2020-10-01 ENCOUNTER — Telehealth: Payer: Self-pay | Admitting: Internal Medicine

## 2020-10-01 DIAGNOSIS — M7531 Calcific tendinitis of right shoulder: Secondary | ICD-10-CM | POA: Diagnosis not present

## 2020-10-01 DIAGNOSIS — I493 Ventricular premature depolarization: Secondary | ICD-10-CM | POA: Diagnosis not present

## 2020-10-01 DIAGNOSIS — R55 Syncope and collapse: Secondary | ICD-10-CM

## 2020-10-01 DIAGNOSIS — M25511 Pain in right shoulder: Secondary | ICD-10-CM | POA: Diagnosis not present

## 2020-10-01 LAB — ECHOCARDIOGRAM COMPLETE
Area-P 1/2: 3.2 cm2
MV M vel: 5.82 m/s
MV Peak grad: 135.5 mmHg
S' Lateral: 4.3 cm

## 2020-10-01 MED ORDER — PERFLUTREN LIPID MICROSPHERE
1.0000 mL | INTRAVENOUS | Status: AC | PRN
  Administered 2020-10-01: 2 mL via INTRAVENOUS

## 2020-10-01 NOTE — Telephone Encounter (Signed)
If her son thinks is that urgent, I could see her as the last patient.  To arrive to the office at 4 pm will work her in.

## 2020-10-01 NOTE — Telephone Encounter (Signed)
Patient's son is insisting on a CB today. His mother is having right shoulder pain. He insist that she been seen today after 3pm.  [facility] is fully booked, I offered UC and an appt for tomorrow. Both were declined and he stated sending this message [facility] would fit her in today because her knows her. Please contact son back.

## 2020-10-01 NOTE — Telephone Encounter (Signed)
Spoke w/ Pt's son [name]- informed PCP is full today, offered an appt for tomorrow- he informed me she is currently at the cardiology office doing an echo. Note from ED on 09/26/20 reviewed they recommended ortho eval- he stated she does not have an appt with them. He informed she had an appt w/ a "doc in a box." I did not get to offer the 4pm appt time to him before we disconnected.

## 2020-10-03 NOTE — Progress Notes (Signed)
Cardiology Clinic Note   Patient Name: Crystal Osborne Date of Encounter: 10/04/2020  Primary Care Provider:  Colon Branch, MD Primary Cardiologist:  Quay Burow, MD  Patient Profile    Crystal Osborne 87 year old female presents the clinic today for follow-up evaluation of her presyncope and PVCs.  Past Medical History    Past Medical History:  Diagnosis Date   AAA (abdominal aortic aneurysm) [facility])    sees Dr Eden Lathe    B12 deficiency    Cancer of right breast [facility]) [employer] 11/28/2008   sees Dr Oneida Alar    COPD (chronic obstructive pulmonary disease) ([place]) 01/25/2014   CVA (cerebrovascular accident) ([place]) 2006   Diabetes mellitus ([place]) 05/29/2010   A1c 6.0 on October 2011    Hyperlipidemia    Hypertension    Osteoporosis 2003   Pneumonia ~ 2014; ~ 2015   Prolapse of female bladder, acquired    Shingles    repeated episone 9/11 went to a UC   TIA (transient ischemic attack) ~ 2005   Transient hypothyroidism    d/t thyroidectomy   UTI (lower urinary tract infection) May 2013   Past Surgical History:  Procedure Laterality Date   ABDOMINAL HYSTERECTOMY     no oophorectomy   CATARACT EXTRACTION W/ INTRAOCULAR LENS  IMPLANT, BILATERAL Bilateral    DILATION AND CURETTAGE OF UTERUS     MASTECTOMY, RADICAL Right 1960s   remotely    PERIPHERAL VASCULAR CATHETERIZATION  12/03/2015   PERIPHERAL VASCULAR CATHETERIZATION N/A 12/03/2015   Procedure: Abdominal Aortogram;  Surgeon: Lorretta Harp, MD;  Location: [place] CV LAB;  Service: Cardiovascular;  Laterality: N/A;   PERIPHERAL VASCULAR CATHETERIZATION Bilateral 12/03/2015   Procedure: Lower Extremity Angiography;  Surgeon: Lorretta Harp, MD;  Location: [place] CV LAB;  Service: Cardiovascular;  Laterality: Bilateral;   PERIPHERAL VASCULAR CATHETERIZATION Bilateral 12/03/2015   Procedure: Peripheral Vascular Intervention;  Surgeon: Lorretta Harp, MD;  Location: [place] CV LAB;  Service:  Cardiovascular;  Laterality: Bilateral;  82mx58mm Lifestream bilateral Common Iliac Arteries   PERIPHERAL VASCULAR CATHETERIZATION Bilateral 12/03/2015   Procedure: Peripheral Vascular Atherectomy;  Surgeon: JLorretta Harp MD;  [place] [facility];  Service: Cardiovascular;  Laterality: Bilateral;  Common Iliac Arteries   THYROIDECTOMY      Allergies  Allergies  Allergen Reactions   Septra [Sulfamethoxazole-Trimethoprim] Itching    Pt states she had a severe allergic reaction; she was in the ICU for 2 days because of taking this.   Ciprofloxacin Hcl Itching   Codeine Rash    Made her feel very strange    History of Present Illness    Crystal HEINBAUGHhas a PMH of peripheral arterial disease, tobacco use, HTN, HLD, TIA, diabetes mellitus type 2, COPD, and carotid artery stenosis.  Echocardiogram 09/15/2016 showed an LVEF of 60-65%, G1 DD, mild-moderate tricuspid regurgitation, mildly elevated pulmonary arterial systolic pressure.  She was seen by Dr. SGardiner Rhymeon 09/13/2020 for dizziness.  She reported she was having intermittent episodes where she would feel back pain, leg weakness, and dizziness.  She did feel as if she may pass out but denied syncopal events.  She was laying down when episodes would come on and they would dissipate on their own.  She did report palpitations during her episodes and described them as fast irregular heartbeats.  She was having her episodes several times per day.  She denied chest pain, dyspnea, and lower extremity swelling.  She was seen in the emergency department on 09/26/2020.  She presented with bradycardia after being seen in urgent care.  She presented to the urgent care with complaints of 3 days of shoulder discomfort.  She denied chest discomfort and shortness of breath.  She denied presyncope and syncope.  Her blood pressure was 159/63 with a pulse of 57.  Her EKG showed sinus rhythm with ventricular bigeminy possible left atrial enlargement.  She  was given hydrocodone for her shoulder pain and told to follow-up with cardiology.  Her cardiac event monitor 09/25/2020 showed frequent PVCs.  Her flecainide was stopped and metoprolol succinate was recommended by Dr. Gardiner Rhyme if her blood pressure will tolerate.  She presents the clinic today for follow-up evaluation states she went to a urgent care clinic where she was prescribed tramadol and a muscle relaxer for her shoulder pain.  She reports some continued dizziness.  She has been taking the medications together.  We reviewed her echocardiogram and her cardiac event monitor.  Her blood pressure today is 108/60 with a pulse of 59.  She does not wish to have cardiac MRI and does not wish to have invasive procedures.  We discussed option of medication management.  Shared decision-making was used with her son to decide to move forward with 12 and half milligrams of metoprolol daily.  We will stop her lisinopril.  I will have her continue the heart healthy low-sodium diet, give her the [place] support stocking sheet, and have her follow-up as scheduled.  She continues to smoke and does not wish to stop at this time.  Today she denies chest pain, shortness of breath,  fatigue, palpitations, melena, hematuria, hemoptysis, diaphoresis, weakness, syncope, orthopnea, and PND.   Home Medications    Prior to Admission medications   Medication Sig Start Date End Date Taking? Authorizing Provider  acetaminophen (TYLENOL) 500 MG tablet Take 1,000 mg by mouth 2 (two) times daily as needed (for pain.).    [provider]  ALPRAZolam Duanne Moron) 0.25 MG tablet Take 1-2 tablets (0.25-0.5 mg total) by mouth at bedtime as needed for sleep. Patient not taking: No sig reported 07/07/18   Colon Branch, MD  AMBULATORY NON FORMULARY MEDICATION Right Breast Prosthesis  Dx: V15.29 09/29/12   Colon Branch, MD  aspirin EC 81 MG tablet Take 81 mg by mouth every evening.    [provider]  atorvastatin (LIPITOR)  40 MG tablet Take 1 tablet (40 mg total) by mouth daily. Patient taking differently: Take 40 mg by mouth at bedtime. 05/03/20   Colon Branch, MD  Cyanocobalamin (VITAMIN B 12 PO) Take 1,000 mg by mouth daily.    [provider]  HYDROcodone-acetaminophen (NORCO/VICODIN) 5-325 MG tablet Take 1 tablet by mouth every 6 (six) hours as needed. 09/26/20   Lacretia Leigh, MD  lisinopril (ZESTRIL) 20 MG tablet Take 1 tablet (20 mg total) by mouth daily. 05/21/20   Colon Branch, MD  POTASSIUM PO Take 1 tablet by mouth daily. OTC    [provider]  psyllium (METAMUCIL SMOOTH TEXTURE) 28 % packet Take 1 packet by mouth 2 (two) times daily as needed (constipation).    [provider]    Family History    Family History  Problem Relation Age of Onset   Cancer Other        sister (lung) , brother (mouth)   Colon cancer Neg Hx    Coronary artery disease Neg Hx    Diabetes type  II Neg Hx    Breast cancer Neg Hx    She indicated that her mother is deceased. She indicated that her father is deceased. She indicated that the status of her neg hx is unknown. She indicated that the status of her other is unknown.  Social History    Social History   Socioeconomic History   Marital status: Divorced    Spouse name: Not on file   Number of children: 3   Years of education: Not on file   Highest education level: Not on file  Occupational History   Occupation: retired   Tobacco Use   Smoking status: Every Day    Packs/day: 0.50    Years: 67.00    Pack years: 33.50    Types: Cigarettes   Smokeless tobacco: Never   Tobacco comments:    1/2 ppd  Substance and Sexual Activity   Alcohol use: No    Alcohol/week: 0.0 standard drinks   Drug use: No   Sexual activity: Never  Other Topics Concern   Not on file  Social History Narrative   Lives by herself , still drives some    Has 3 children, 1 living son    Emergency contact: Sirina Yandow [phone], he lives in the [place] area    Lost a son ~ 2017, he had  lung ca, stroke (09-2011)    Lost another son Dominica Severin) 02/20/2019     Lost a sister died 22-15           Social Determinants of Health   Financial Resource Strain: Not on file  Food Insecurity: Not on file  Transportation Needs: Not on file  Physical Activity: Not on file  Stress: Not on file  Social Connections: Not on file  Intimate Partner Violence: Not on file     Review of Systems    General:  No chills, fever, night sweats or weight changes.  Cardiovascular:  No chest pain, dyspnea on exertion, edema, orthopnea, palpitations, paroxysmal nocturnal dyspnea. Dermatological: No rash, lesions/masses Respiratory: No cough, dyspnea Urologic: No hematuria, dysuria Abdominal:   No nausea, vomiting, diarrhea, bright red blood per rectum, melena, or hematemesis Neurologic:  No visual changes, wkns, changes in mental status. All other systems reviewed and are otherwise negative except as noted above.  Physical Exam    VS:  BP 108/60 (BP Location: Left Arm, Patient Position: Sitting, Cuff Size: Normal)   Pulse (!) 59   SpO2 92%  , BMI There is no height or weight on file to calculate BMI. GEN: Well nourished, well developed, in no acute distress. HEENT: normal. Neck: Supple, no JVD, carotid bruits, or masses. Cardiac: RRR, no murmurs, rubs, or gallops. No clubbing, cyanosis, edema.  Radials/DP/PT 2+ and equal bilaterally.  Respiratory:  Respirations regular and unlabored, clear to auscultation bilaterally. GI: Soft, nontender, nondistended, BS + x 4. MS: no deformity or atrophy. Skin: warm and dry, no rash. Neuro:  Strength and sensation are intact. Psych: Normal affect.  Accessory Clinical Findings    Recent Labs: 09/11/2020: ALT 10; Magnesium 1.9; TSH 3.46 09/26/2020: BUN 12; Creatinine, Ser 1.13; Hemoglobin 13.4; Platelets 231; Potassium 4.8; Sodium 140   Recent Lipid Panel    Component Value Date/Time   CHOL 134 10/24/2019 1154   TRIG 117  10/24/2019 1154   TRIG 112 01/06/2006 0841   HDL 51 10/24/2019 1154   CHOLHDL 2.6 10/24/2019 1154   VLDL 21.8 07/19/2018 0919   LDLCALC 63 10/24/2019 1154   LDLDIRECT  149.2 08/03/2006 1054    ECG personally reviewed by me today-none today.  Cardiac event monitor 09/25/2020 Frequent PVCs (15% of beats) 7 episodes of SVT, longest lasting 14 seconds with average rate 107 bpm 1 episode of NSVT lasting 8 beats     Patch Wear Time:  3 days and 3 hours (2022-08-07T09:07:09-398 to 2022-08-10T13:04:10-398)   Patient had a min HR of 52 bpm, max HR of 182 bpm, and avg HR of 70 bpm. Predominant underlying rhythm was Sinus Rhythm. 1 run of Ventricular Tachycardia occurred lasting 8 beats with a max rate of 141 bpm (avg 131 bpm). 7 Supraventricular Tachycardia  runs occurred, the run with the fastest interval lasting 7 beats with a max rate of 182 bpm, the longest lasting 13.6 secs with an avg rate of 107 bpm. Isolated SVEs were rare (<1.0%), SVE Couplets were rare (<1.0%), and SVE Triplets were rare (<1.0%).  Isolated VEs were frequent (15.2%, 48737), VE Couplets were rare (<1.0%, 564), and VE Triplets were rare (<1.0%, 66). Ventricular Bigeminy and Trigeminy were present.  6 patient triggered events, corresponding to sinus rhythm  PACs/PVCs and ventricular bigeminy. Metoprolol succinate was recommended by Dr. Gardiner Rhyme  Echocardiogram 10/01/2020 IMPRESSIONS      1. Significant hypertrophy of LV apex with slit like cavity of LV apex. Frequent ectopy/bigeminy, makes assessment of LVEF difficult OVerall LVEF appears mildly decreased.. The left ventricular internal cavity size was mildly dilated.  2. Right ventricular systolic function is low normal. The right ventricular size is normal. There is mildly elevated pulmonary artery systolic pressure.  3. Left atrial size was mildly dilated.  4. Mild mitral valve regurgitation.  5. Tricuspid valve regurgitation is mild to moderate.  6. The aortic valve  is tricuspid. Aortic valve regurgitation is not visualized. Mild to moderate aortic valve sclerosis/calcification is present, without any evidence of aortic stenosis.  7. The inferior vena cava is normal in size with greater than 50% respiratory variability, suggesting right atrial pressure of 3 mmHg.    Assessment & Plan   1.  Near syncope/palpitations-reports taking muscle relaxers and tramadol.  I recommended that she stop taking the muscle relaxer.  Cardiac event monitor showed frequent PVCs.  Adding metoprolol succinate was recommended.  Echocardiogram showed hypertrophic cardiomyopathy. Start metoprolol succinate 12.5 mg daily Heart healthy low-sodium diet-salty 6 given Increase physical activity as tolerated Increase p.o. hydration   Essential hypertension-BP today 108/60.  Well-controlled at home. Stop lisinopril Start metoprolol succinate 12.5 mg daily Heart healthy low-sodium diet-salty 6 given Increase physical activity as tolerated  Carotid artery disease-carotid duplex 11/17/2015 showed right ICA 50-69% and left ICA greater than 70% Continue aspirin, atorvastatin, Metamucil Heart healthy low-sodium high-fiber diet Increase physical activity as tolerated Repeat carotid ultrasound  Hyperlipidemia-10/24/2019: Cholesterol 134; HDL 51; LDL Cholesterol (Calc) 63; Triglycerides 117 Continue aspirin, atorvastatin, Metamucil Heart healthy low-sodium high-fiber diet Increase physical activity as tolerated  Disposition: Follow-up with me as scheduled  Jossie Ng. Dornell Grasmick NP-C    10/04/2020, 3:42 PM [address] Office [phone] Fax [phone]  Notice: This dictation was prepared with Dragon dictation along with smaller phrase technology. Any transcriptional errors that result from this process are unintentional and may not be corrected upon review.  I spent 15 minutes examining this patient, reviewing medications, and  using patient centered shared decision making involving her cardiac care.  Prior to her visit I spent greater than 20 minutes reviewing her past medical history,  medications, and prior  cardiac tests.

## 2020-10-04 ENCOUNTER — Other Ambulatory Visit: Payer: Self-pay

## 2020-10-04 ENCOUNTER — Encounter: Payer: Self-pay | Admitting: General Practice

## 2020-10-04 ENCOUNTER — Ambulatory Visit (INDEPENDENT_AMBULATORY_CARE_PROVIDER_SITE_OTHER): Payer: Medicare Other | Admitting: General Practice

## 2020-10-04 VITALS — BP 108/60 | HR 59

## 2020-10-04 DIAGNOSIS — I10 Essential (primary) hypertension: Secondary | ICD-10-CM

## 2020-10-04 DIAGNOSIS — R002 Palpitations: Secondary | ICD-10-CM

## 2020-10-04 DIAGNOSIS — E782 Mixed hyperlipidemia: Secondary | ICD-10-CM

## 2020-10-04 DIAGNOSIS — R55 Syncope and collapse: Secondary | ICD-10-CM

## 2020-10-04 MED ORDER — METOPROLOL SUCCINATE ER 25 MG PO TB24: 12.5000 mg | ORAL_TABLET | Freq: Every day | ORAL | 6 refills | Status: DC

## 2020-10-04 NOTE — Patient Instructions (Signed)
Medication Instructions:  STOP LISINOPRIL  START METOPROLOL SUCCINATE 12.'5MG'$  DAILY (1/2 TABLET)  *If you need a refill on your cardiac medications before your next appointment, please call your pharmacy*  Lab Work:   Testing/Procedures:  NONE    NONE  Special Instructions MAINTAIN HYDRATION-STAY HYDRATED  Please try to avoid these triggers: Do not use any products that have nicotine or tobacco in them. These include cigarettes, e-cigarettes, and chewing tobacco. If you need help quitting, ask your doctor. Eat heart-healthy foods. Talk with your doctor about the right eating plan for you. Exercise regularly as told by your doctor. Stay hydrated Do not drink alcohol, Caffeine or chocolate. Lose weight if you are overweight. Do not use drugs, including cannabis   PLEASE PURCHASE AND WEAR COMPRESSION STOCKINGS DAILY AND TAKE OFF AT BEDTIME. Compression stockings are elastic socks that squeeze the legs. They help to increase blood flow to the legs and to decrease swelling in the legs from fluid retention, and reduce the chance of developing blood clots in the lower legs. Please put on in the AM when dressing and off at night when dressing for bed.  LET THEM KNOW THAT YOU NEED KNEE HIGH'S WITH COMPRESSION OF 15-20 mmhg.  [employer]  [place] [address] [phone]); [place], [place] [phone];  EMAIL   [email or web address]  [phone].  PLEASE MAKE SURE TO ELEVATE YOUR FEET & LEGS WHILE SITTING, THIS WILL HELP WITH THE SWELLING ALSO.   Follow-Up: Your next appointment:  KEEP SCHEDULED APPOINTMENT In Person [place], FNP-C  At [facility], you and your health needs are our priority.  As part of our continuing mission to provide you with exceptional heart care, we have created designated Provider Care Teams.  These Care Teams include your primary Cardiologist (physician) and Advanced Practice Providers (APPs -  Physician Assistants and Nurse Practitioners) who all  work together to provide you with the care you need, when you need it.

## 2020-10-08 ENCOUNTER — Ambulatory Visit (INDEPENDENT_AMBULATORY_CARE_PROVIDER_SITE_OTHER): Payer: Medicare Other | Admitting: Internal Medicine

## 2020-10-08 ENCOUNTER — Encounter: Payer: Self-pay | Admitting: Internal Medicine

## 2020-10-08 ENCOUNTER — Other Ambulatory Visit: Payer: Self-pay

## 2020-10-08 VITALS — BP 116/74 | HR 64 | Temp 98.1°F | Resp 18 | Ht 65.0 in | Wt 115.4 lb

## 2020-10-08 DIAGNOSIS — R627 Adult failure to thrive: Secondary | ICD-10-CM | POA: Diagnosis not present

## 2020-10-08 DIAGNOSIS — R5383 Other fatigue: Secondary | ICD-10-CM

## 2020-10-08 DIAGNOSIS — R42 Dizziness and giddiness: Secondary | ICD-10-CM

## 2020-10-08 NOTE — Patient Instructions (Signed)
   GO TO THE FRONT DESK, PLEASE SCHEDULE YOUR APPOINTMENTS Come back for   a check up in 3 months  

## 2020-10-08 NOTE — Progress Notes (Signed)
Subjective:    Patient ID: Crystal Osborne, female    DOB: [birth year], 87 y.o.   MRN: [id number]  DOS:  10/08/2020 Type of visit - description: Follow-up Since the last office visit, she saw cardiology and went to the ER. Records reviewed. She is here with her son. Overall feels perhaps slightly better, still somewhat dizzy fatigue. Also has R shoulder pain, went to urgent care twice (no records) they refer her to Ortho but she is not interested.  Review of Systems See above   Past Medical History:  Diagnosis Date   AAA (abdominal aortic aneurysm) [facility])    sees Dr Eden Lathe    B12 deficiency    Cancer of right breast [facility]) [place] 11/28/2008   sees Dr Oneida Alar    COPD (chronic obstructive pulmonary disease) ([place]) 01/25/2014   CVA (cerebrovascular accident) ([place]) 2006   Diabetes mellitus ([place]) 05/29/2010   A1c 6.0 on October 2011    Hyperlipidemia    Hypertension    Osteoporosis 2003   Pneumonia ~ 2014; ~ 2015   Prolapse of female bladder, acquired    Shingles    repeated episone 9/11 went to a UC   TIA (transient ischemic attack) ~ 2005   Transient hypothyroidism    d/t thyroidectomy   UTI (lower urinary tract infection) May 2013    Past Surgical History:  Procedure Laterality Date   ABDOMINAL HYSTERECTOMY     no oophorectomy   CATARACT EXTRACTION W/ INTRAOCULAR LENS  IMPLANT, BILATERAL Bilateral    DILATION AND CURETTAGE OF UTERUS     MASTECTOMY, RADICAL Right 1960s   remotely    PERIPHERAL VASCULAR CATHETERIZATION  12/03/2015   PERIPHERAL VASCULAR CATHETERIZATION N/A 12/03/2015   Procedure: Abdominal Aortogram;  Surgeon: Lorretta Harp, MD;  Location: [place] CV LAB;  Service: Cardiovascular;  Laterality: N/A;   PERIPHERAL VASCULAR CATHETERIZATION Bilateral 12/03/2015   Procedure: Lower Extremity Angiography;  Surgeon: Lorretta Harp, MD;  Location: [place] CV LAB;  Service: Cardiovascular;  Laterality: Bilateral;   PERIPHERAL VASCULAR  CATHETERIZATION Bilateral 12/03/2015   Procedure: Peripheral Vascular Intervention;  Surgeon: Lorretta Harp, MD;  Location: [place] CV LAB;  Service: Cardiovascular;  Laterality: Bilateral;  47mx58mm Lifestream bilateral Common Iliac Arteries   PERIPHERAL VASCULAR CATHETERIZATION Bilateral 12/03/2015   Procedure: Peripheral Vascular Atherectomy;  Surgeon: JLorretta Harp MD;  [place] [facility];  Service: Cardiovascular;  Laterality: Bilateral;  Common Iliac Arteries   THYROIDECTOMY      Allergies as of 10/08/2020       Reactions   Septra [sulfamethoxazole-trimethoprim] Itching   Pt states she had a severe allergic reaction; she was in the ICU for 2 days because of taking this.   Ciprofloxacin Hcl Itching   Codeine Rash   Made her feel very strange        Medication List        Accurate as of October 08, 2020 11:59 PM. If you have any questions, ask your nurse or doctor.          STOP taking these medications    HYDROcodone-acetaminophen 5-325 MG tablet Commonly known as: NORCO/VICODIN Stopped by: JKathlene November MD   tiZANidine 2 MG tablet Commonly known as: ZANAFLEX Stopped by: JKathlene November MD   traMADol 50 MG tablet Commonly known as: ULTRAM Stopped by: JKathlene November MD       TAKE these medications    acetaminophen 500 MG tablet Commonly known as:  TYLENOL Take 1,000 mg by mouth 2 (two) times daily as needed (for pain.).   ALPRAZolam 0.25 MG tablet Commonly known as: XANAX Take 1-2 tablets (0.25-0.5 mg total) by mouth at bedtime as needed for sleep.   AMBULATORY NON FORMULARY MEDICATION Right Breast Prosthesis  Dx: [id number]   aspirin EC 81 MG tablet Take 81 mg by mouth every evening.   atorvastatin 40 MG tablet Commonly known as: LIPITOR Take 1 tablet (40 mg total) by mouth daily. What changed: when to take this   metoprolol succinate 25 MG 24 hr tablet Commonly known as: Toprol XL Take 0.5 tablets (12.5 mg total) by mouth daily.   POTASSIUM  PO Take 1 tablet by mouth daily. OTC   psyllium 28 % packet Commonly known as: METAMUCIL SMOOTH TEXTURE Take 1 packet by mouth 2 (two) times daily as needed (constipation).   VITAMIN B 12 PO Take 1,000 mg by mouth daily.           Objective:   Physical Exam BP 116/74 (BP Location: Left Arm, Patient Position: Sitting, Cuff Size: Small)   Pulse 64   Temp 98.1 F (36.7 C) (Oral)   Resp 18   Ht '5\' 5"'$  (1.651 m)   Wt 115 lb 6 oz (52.3 kg)   SpO2 97%   BMI 19.20 kg/m  General:   Well developed, elderly lady, no distress, she seems extremely tired but not toxic. HEENT:  Normocephalic . Face symmetric, atraumatic Lungs:  CTA B Normal respiratory effort, no intercostal retractions, no accessory muscle use. Heart: Bradycardic, occasional irregular beat.  Lower extremities: no pretibial edema bilaterally  Skin: Not pale. Not jaundice Neurologic:  alert & oriented X3.  Speech normal, gait not tested. Psych--  Cognition and judgment appear intact.  Cooperative with normal attention span and concentration.  Behavior appropriate. No anxious or depressed appearing.      Assessment      Assessment  Prediabetes HTN Hyperlipidemia Insomnia-- rarely has xanax  COPD: smoker, XR suggestive of dx, no PFTs Osteoporosis 2003-- refused dexas consistently  B12 def (185 05-2013) CV: ---CVA 2006 ---Carotid artery dz--last [place] 09-2011 40-59% B- ICA, declined further testing ---AAA --last [place]  05-2015, 1 year -- PVD, + ABIs 10-2015, 11-2015: B iliac stents Shingles 10-2009 and ~ 07-2015 (seen elsewhere, L face) Status post thyroidectomy, transient hypothyroidism Breast cancer 1960 Allergies- hives sometimes , saw derm    PLAN Since the last visit, chart is reviewed. 09/13/2020: Saw cardiology, dx near syncope, palpitations. Zio patch showed frequent PVCs, echo: Suggest apical hypertrophic cardiomyopathy. 8/17/2022Martin Majestic to the ER, found in bigeminy. 10/04/2020: Saw cardiology again, ,  lisinopril d/c, Rx metoprolol.  Rec to stop muscle relaxants, and DC tramadol.  Dizziness and fatigue: See chart review above, she was Dx with bigeminy at the ER and with PVCs by cardiology. Now off lisinopril and  on metoprolol, she feels somewhat better. Right shoulder pain: Saw urgent care x2, referred to Ortho, declined need to go.  Taking essentially Tylenol as needed. Failure to thrive, Social: Lives at herself, although her son is very attentive,  I am concerned about her risk of fall, I am not sure if she will get much stronger from this point on. She is already looking into getting some help at home and possibly nursing home. Encouraged to have a life alert or having her cell phone with her at all times. She was receptive to the advice.   Time spent 30 minutes, more than 50% counseling  about failure to thrive I am reviewing the chart.  This visit occurred during the SARS-CoV-2 public health emergency.  Safety protocols were in place, including screening questions prior to the visit, additional usage of staff PPE, and extensive cleaning of exam room while observing appropriate contact time as indicated for disinfecting solutions.

## 2020-10-09 NOTE — Assessment & Plan Note (Signed)
Since the last visit, chart is reviewed. 09/13/2020: Saw cardiology, dx near syncope, palpitations. Zio patch showed frequent PVCs, echo: Suggest apical hypertrophic cardiomyopathy. 8/17/2022Martin Majestic to the ER, found in bigeminy. 10/04/2020: Saw cardiology again, , lisinopril d/c, Rx metoprolol.  Rec to stop muscle relaxants, and DC tramadol.  Dizziness and fatigue: See chart review above, she was Dx with bigeminy at the ER and with PVCs by cardiology. Now off lisinopril and  on metoprolol, she feels somewhat better. Right shoulder pain: Saw urgent care x2, referred to Ortho, declined need to go.  Taking essentially Tylenol as needed. Failure to thrive, Social: Lives at herself, although her son is very attentive,  I am concerned about her risk of fall, I am not sure if she will get much stronger from this point on. She is already looking into getting some help at home and possibly nursing home. Encouraged to have a life alert or having her cell phone with her at all times. She was receptive to the advice.

## 2020-10-10 ENCOUNTER — Other Ambulatory Visit: Payer: Self-pay

## 2020-10-10 ENCOUNTER — Inpatient Hospital Stay (HOSPITAL_COMMUNITY): Payer: Medicare Other

## 2020-10-10 ENCOUNTER — Inpatient Hospital Stay (HOSPITAL_COMMUNITY)
Admission: EM | Admit: 2020-10-10 | Discharge: 2020-10-19 | DRG: 193 | Disposition: A | Discharge status: Skilled Nursing Facility | Payer: Medicare Other | Attending: Internal Medicine | Admitting: Internal Medicine

## 2020-10-10 ENCOUNTER — Encounter (HOSPITAL_COMMUNITY): Payer: Self-pay

## 2020-10-10 ENCOUNTER — Emergency Department (HOSPITAL_COMMUNITY): Payer: Medicare Other

## 2020-10-10 DIAGNOSIS — R7989 Other specified abnormal findings of blood chemistry: Secondary | ICD-10-CM | POA: Diagnosis not present

## 2020-10-10 DIAGNOSIS — R0902 Hypoxemia: Secondary | ICD-10-CM | POA: Diagnosis not present

## 2020-10-10 DIAGNOSIS — R0602 Shortness of breath: Secondary | ICD-10-CM | POA: Diagnosis not present

## 2020-10-10 DIAGNOSIS — J449 Chronic obstructive pulmonary disease, unspecified: Secondary | ICD-10-CM | POA: Diagnosis present

## 2020-10-10 DIAGNOSIS — M19011 Primary osteoarthritis, right shoulder: Secondary | ICD-10-CM | POA: Diagnosis present

## 2020-10-10 DIAGNOSIS — E119 Type 2 diabetes mellitus without complications: Secondary | ICD-10-CM | POA: Diagnosis present

## 2020-10-10 DIAGNOSIS — R410 Disorientation, unspecified: Secondary | ICD-10-CM

## 2020-10-10 DIAGNOSIS — R1312 Dysphagia, oropharyngeal phase: Secondary | ICD-10-CM | POA: Diagnosis present

## 2020-10-10 DIAGNOSIS — E890 Postprocedural hypothyroidism: Secondary | ICD-10-CM | POA: Diagnosis not present

## 2020-10-10 DIAGNOSIS — I499 Cardiac arrhythmia, unspecified: Secondary | ICD-10-CM | POA: Diagnosis not present

## 2020-10-10 DIAGNOSIS — I422 Other hypertrophic cardiomyopathy: Secondary | ICD-10-CM | POA: Diagnosis not present

## 2020-10-10 DIAGNOSIS — E785 Hyperlipidemia, unspecified: Secondary | ICD-10-CM | POA: Diagnosis not present

## 2020-10-10 DIAGNOSIS — I6350 Cerebral infarction due to unspecified occlusion or stenosis of unspecified cerebral artery: Secondary | ICD-10-CM | POA: Diagnosis not present

## 2020-10-10 DIAGNOSIS — I10 Essential (primary) hypertension: Secondary | ICD-10-CM | POA: Diagnosis present

## 2020-10-10 DIAGNOSIS — F17200 Nicotine dependence, unspecified, uncomplicated: Secondary | ICD-10-CM | POA: Diagnosis present

## 2020-10-10 DIAGNOSIS — R4182 Altered mental status, unspecified: Secondary | ICD-10-CM | POA: Diagnosis not present

## 2020-10-10 DIAGNOSIS — I714 Abdominal aortic aneurysm, without rupture: Secondary | ICD-10-CM | POA: Diagnosis not present

## 2020-10-10 DIAGNOSIS — I421 Obstructive hypertrophic cardiomyopathy: Secondary | ICD-10-CM | POA: Diagnosis present

## 2020-10-10 DIAGNOSIS — Z885 Allergy status to narcotic agent status: Secondary | ICD-10-CM

## 2020-10-10 DIAGNOSIS — J9601 Acute respiratory failure with hypoxia: Secondary | ICD-10-CM | POA: Diagnosis not present

## 2020-10-10 DIAGNOSIS — F05 Delirium due to known physiological condition: Secondary | ICD-10-CM | POA: Diagnosis present

## 2020-10-10 DIAGNOSIS — R52 Pain, unspecified: Secondary | ICD-10-CM

## 2020-10-10 DIAGNOSIS — Z79899 Other long term (current) drug therapy: Secondary | ICD-10-CM

## 2020-10-10 DIAGNOSIS — Z20822 Contact with and (suspected) exposure to covid-19: Secondary | ICD-10-CM | POA: Diagnosis present

## 2020-10-10 DIAGNOSIS — G9341 Metabolic encephalopathy: Secondary | ICD-10-CM | POA: Diagnosis not present

## 2020-10-10 DIAGNOSIS — Z8673 Personal history of transient ischemic attack (TIA), and cerebral infarction without residual deficits: Secondary | ICD-10-CM | POA: Diagnosis not present

## 2020-10-10 DIAGNOSIS — T40425A Adverse effect of tramadol, initial encounter: Secondary | ICD-10-CM | POA: Diagnosis present

## 2020-10-10 DIAGNOSIS — R262 Difficulty in walking, not elsewhere classified: Secondary | ICD-10-CM | POA: Diagnosis present

## 2020-10-10 DIAGNOSIS — J440 Chronic obstructive pulmonary disease with acute lower respiratory infection: Secondary | ICD-10-CM | POA: Diagnosis present

## 2020-10-10 DIAGNOSIS — F17210 Nicotine dependence, cigarettes, uncomplicated: Secondary | ICD-10-CM | POA: Diagnosis not present

## 2020-10-10 DIAGNOSIS — Z882 Allergy status to sulfonamides status: Secondary | ICD-10-CM

## 2020-10-10 DIAGNOSIS — M6281 Muscle weakness (generalized): Secondary | ICD-10-CM | POA: Diagnosis not present

## 2020-10-10 DIAGNOSIS — R17 Unspecified jaundice: Secondary | ICD-10-CM | POA: Diagnosis present

## 2020-10-10 DIAGNOSIS — I779 Disorder of arteries and arterioles, unspecified: Secondary | ICD-10-CM | POA: Diagnosis not present

## 2020-10-10 DIAGNOSIS — Z853 Personal history of malignant neoplasm of breast: Secondary | ICD-10-CM | POA: Diagnosis not present

## 2020-10-10 DIAGNOSIS — Z7982 Long term (current) use of aspirin: Secondary | ICD-10-CM

## 2020-10-10 DIAGNOSIS — Z66 Do not resuscitate: Secondary | ICD-10-CM | POA: Diagnosis not present

## 2020-10-10 DIAGNOSIS — R404 Transient alteration of awareness: Secondary | ICD-10-CM | POA: Diagnosis not present

## 2020-10-10 DIAGNOSIS — Z743 Need for continuous supervision: Secondary | ICD-10-CM | POA: Diagnosis not present

## 2020-10-10 DIAGNOSIS — M810 Age-related osteoporosis without current pathological fracture: Secondary | ICD-10-CM | POA: Diagnosis not present

## 2020-10-10 DIAGNOSIS — J90 Pleural effusion, not elsewhere classified: Secondary | ICD-10-CM | POA: Diagnosis not present

## 2020-10-10 DIAGNOSIS — J189 Pneumonia, unspecified organism: Secondary | ICD-10-CM | POA: Diagnosis present

## 2020-10-10 DIAGNOSIS — J9690 Respiratory failure, unspecified, unspecified whether with hypoxia or hypercapnia: Secondary | ICD-10-CM | POA: Diagnosis not present

## 2020-10-10 DIAGNOSIS — I491 Atrial premature depolarization: Secondary | ICD-10-CM | POA: Diagnosis not present

## 2020-10-10 DIAGNOSIS — J181 Lobar pneumonia, unspecified organism: Secondary | ICD-10-CM | POA: Diagnosis present

## 2020-10-10 DIAGNOSIS — R6889 Other general symptoms and signs: Secondary | ICD-10-CM | POA: Diagnosis not present

## 2020-10-10 DIAGNOSIS — I739 Peripheral vascular disease, unspecified: Secondary | ICD-10-CM | POA: Diagnosis not present

## 2020-10-10 DIAGNOSIS — I517 Cardiomegaly: Secondary | ICD-10-CM | POA: Diagnosis not present

## 2020-10-10 DIAGNOSIS — R1310 Dysphagia, unspecified: Secondary | ICD-10-CM | POA: Diagnosis not present

## 2020-10-10 DIAGNOSIS — R2689 Other abnormalities of gait and mobility: Secondary | ICD-10-CM | POA: Diagnosis not present

## 2020-10-10 DIAGNOSIS — R2681 Unsteadiness on feet: Secondary | ICD-10-CM | POA: Diagnosis not present

## 2020-10-10 LAB — CBC WITH DIFFERENTIAL/PLATELET
Abs Immature Granulocytes: 0.06 10*3/uL (ref 0.00–0.07)
Basophils Absolute: 0 10*3/uL (ref 0.0–0.1)
Basophils Relative: 0 %
Eosinophils Absolute: 0 10*3/uL (ref 0.0–0.5)
Eosinophils Relative: 0 %
HCT: 43.2 % (ref 36.0–46.0)
Hemoglobin: 14.1 g/dL (ref 12.0–15.0)
Immature Granulocytes: 1 %
Lymphocytes Relative: 13 %
Lymphs Abs: 1.7 10*3/uL (ref 0.7–4.0)
MCH: 30.7 pg (ref 26.0–34.0)
MCHC: 32.6 g/dL (ref 30.0–36.0)
MCV: 93.9 fL (ref 80.0–100.0)
Monocytes Absolute: 1 10*3/uL (ref 0.1–1.0)
Monocytes Relative: 8 %
Neutro Abs: 10.1 10*3/uL — ABNORMAL HIGH (ref 1.7–7.7)
Neutrophils Relative %: 78 %
Platelets: 238 10*3/uL (ref 150–400)
RBC: 4.6 MIL/uL (ref 3.87–5.11)
RDW: 15 % (ref 11.5–15.5)
WBC: 12.9 10*3/uL — ABNORMAL HIGH (ref 4.0–10.5)
nRBC: 0 % (ref 0.0–0.2)

## 2020-10-10 LAB — RAPID URINE DRUG SCREEN, HOSP PERFORMED
Amphetamines: NOT DETECTED
Barbiturates: NOT DETECTED
Benzodiazepines: NOT DETECTED
Cocaine: NOT DETECTED
Opiates: NOT DETECTED
Tetrahydrocannabinol: NOT DETECTED

## 2020-10-10 LAB — COMPREHENSIVE METABOLIC PANEL
ALT: 30 U/L (ref 0–44)
AST: 28 U/L (ref 15–41)
Albumin: 3.3 g/dL — ABNORMAL LOW (ref 3.5–5.0)
Alkaline Phosphatase: 76 U/L (ref 38–126)
Anion gap: 9 (ref 5–15)
BUN: 15 mg/dL (ref 8–23)
CO2: 30 mmol/L (ref 22–32)
Calcium: 8.8 mg/dL — ABNORMAL LOW (ref 8.9–10.3)
Chloride: 97 mmol/L — ABNORMAL LOW (ref 98–111)
Creatinine, Ser: 0.97 mg/dL (ref 0.44–1.00)
GFR, Estimated: 57 mL/min — ABNORMAL LOW (ref >60)
Glucose, Bld: 155 mg/dL — ABNORMAL HIGH (ref 70–99)
Potassium: 5.1 mmol/L (ref 3.5–5.1)
Sodium: 136 mmol/L (ref 135–145)
Total Bilirubin: 1.4 mg/dL — ABNORMAL HIGH (ref 0.3–1.2)
Total Protein: 6.1 g/dL — ABNORMAL LOW (ref 6.5–8.1)

## 2020-10-10 LAB — URINALYSIS, ROUTINE W REFLEX MICROSCOPIC
Bilirubin Urine: NEGATIVE
Glucose, UA: NEGATIVE mg/dL
Ketones, ur: NEGATIVE mg/dL
Leukocytes,Ua: NEGATIVE
Nitrite: POSITIVE — AB
Protein, ur: 100 mg/dL — AB
Specific Gravity, Urine: 1.01 (ref 1.005–1.030)
pH: 5 (ref 5.0–8.0)

## 2020-10-10 LAB — RESP PANEL BY RT-PCR (FLU A&B, COVID) ARPGX2
Influenza A by PCR: NEGATIVE
Influenza B by PCR: NEGATIVE
SARS Coronavirus 2 by RT PCR: NEGATIVE

## 2020-10-10 LAB — LACTIC ACID, PLASMA
Lactic Acid, Venous: 1.8 mmol/L (ref 0.5–1.9)
Lactic Acid, Venous: 3.3 mmol/L (ref 0.5–1.9)

## 2020-10-10 MED ORDER — ACETAMINOPHEN 650 MG RE SUPP: 650.0000 mg | Freq: Four times a day (QID) | RECTAL | Status: DC | PRN

## 2020-10-10 MED ORDER — SODIUM CHLORIDE 0.9 % IV SOLN
2.0000 g | INTRAVENOUS | Status: DC
Start: 2020-10-11 — End: 2020-10-14
  Administered 2020-10-11 – 2020-10-13 (×3): 2 g via INTRAVENOUS
  Filled 2020-10-10 (×3): qty 20

## 2020-10-10 MED ORDER — THIAMINE HCL 100 MG PO TABS
100.0000 mg | ORAL_TABLET | Freq: Every day | ORAL | Status: DC
  Administered 2020-10-11 – 2020-10-19 (×9): 100 mg via ORAL
  Filled 2020-10-10 (×9): qty 1

## 2020-10-10 MED ORDER — LORAZEPAM 2 MG/ML IJ SOLN
0.5000 mg | Freq: Once | INTRAMUSCULAR | Status: AC
  Administered 2020-10-10: 0.5 mg via INTRAVENOUS
  Filled 2020-10-10: qty 1

## 2020-10-10 MED ORDER — ACETAMINOPHEN 325 MG PO TABS
650.0000 mg | ORAL_TABLET | Freq: Four times a day (QID) | ORAL | Status: DC | PRN
  Administered 2020-10-12 – 2020-10-18 (×8): 650 mg via ORAL
  Filled 2020-10-10 (×8): qty 2

## 2020-10-10 MED ORDER — LACTATED RINGERS IV BOLUS
30.0000 mL/kg | Freq: Once | INTRAVENOUS | Status: AC
  Administered 2020-10-10: 1569 mL via INTRAVENOUS

## 2020-10-10 MED ORDER — IPRATROPIUM-ALBUTEROL 0.5-2.5 (3) MG/3ML IN SOLN: 3.0000 mL | RESPIRATORY_TRACT | Status: DC | PRN

## 2020-10-10 MED ORDER — ONDANSETRON HCL 4 MG PO TABS: 4.0000 mg | ORAL_TABLET | Freq: Four times a day (QID) | ORAL | Status: DC | PRN

## 2020-10-10 MED ORDER — SODIUM CHLORIDE 0.45 % IV SOLN: INTRAVENOUS | Status: DC

## 2020-10-10 MED ORDER — HYDRALAZINE HCL 20 MG/ML IJ SOLN
10.0000 mg | Freq: Four times a day (QID) | INTRAMUSCULAR | Status: DC | PRN
  Administered 2020-10-12 – 2020-10-19 (×7): 10 mg via INTRAVENOUS
  Filled 2020-10-10 (×8): qty 1

## 2020-10-10 MED ORDER — SODIUM CHLORIDE 0.9 % IV SOLN
500.0000 mg | Freq: Once | INTRAVENOUS | Status: AC
  Administered 2020-10-10: 500 mg via INTRAVENOUS
  Filled 2020-10-10: qty 500

## 2020-10-10 MED ORDER — HALOPERIDOL LACTATE 5 MG/ML IJ SOLN
0.5000 mg | Freq: Four times a day (QID) | INTRAMUSCULAR | Status: DC | PRN
  Administered 2020-10-10 – 2020-10-14 (×3): 0.5 mg via INTRAVENOUS
  Filled 2020-10-10 (×3): qty 1

## 2020-10-10 MED ORDER — ONDANSETRON HCL 4 MG/2ML IJ SOLN: 4.0000 mg | Freq: Four times a day (QID) | INTRAMUSCULAR | Status: DC | PRN

## 2020-10-10 MED ORDER — SODIUM CHLORIDE 0.9 % IV BOLUS
1000.0000 mL | Freq: Once | INTRAVENOUS | Status: AC
  Administered 2020-10-10: 1000 mL via INTRAVENOUS

## 2020-10-10 MED ORDER — METOPROLOL SUCCINATE ER 25 MG PO TB24
12.5000 mg | ORAL_TABLET | Freq: Every day | ORAL | Status: DC
  Administered 2020-10-11 – 2020-10-19 (×9): 12.5 mg via ORAL
  Filled 2020-10-10 (×9): qty 1

## 2020-10-10 MED ORDER — SODIUM CHLORIDE 0.9 % IV SOLN
1.0000 g | Freq: Once | INTRAVENOUS | Status: AC
  Administered 2020-10-10: 1 g via INTRAVENOUS
  Filled 2020-10-10: qty 10

## 2020-10-10 MED ORDER — ENOXAPARIN SODIUM 40 MG/0.4ML IJ SOSY
40.0000 mg | PREFILLED_SYRINGE | INTRAMUSCULAR | Status: DC
  Administered 2020-10-10 – 2020-10-18 (×8): 40 mg via SUBCUTANEOUS
  Filled 2020-10-10 (×9): qty 0.4

## 2020-10-10 MED ORDER — ALBUTEROL SULFATE (2.5 MG/3ML) 0.083% IN NEBU
2.5000 mg | INHALATION_SOLUTION | Freq: Once | RESPIRATORY_TRACT | Status: AC
  Administered 2020-10-10: 2.5 mg via RESPIRATORY_TRACT

## 2020-10-10 MED ORDER — NICOTINE 14 MG/24HR TD PT24
14.0000 mg | MEDICATED_PATCH | TRANSDERMAL | Status: DC
  Administered 2020-10-10 – 2020-10-18 (×9): 14 mg via TRANSDERMAL
  Filled 2020-10-10 (×9): qty 1

## 2020-10-10 MED ORDER — SODIUM CHLORIDE 0.9 % IV SOLN
500.0000 mg | INTRAVENOUS | Status: DC
  Administered 2020-10-12 – 2020-10-13 (×2): 500 mg via INTRAVENOUS
  Filled 2020-10-10 (×3): qty 500

## 2020-10-10 NOTE — ED Notes (Signed)
Pt's son at bedside told this RN that he knows that his mothers wishes are to NOT be resuscitated or intubated. He stated that he cannot find the yellow form but as her healthcare POA he wanted her wishes to be upheld until he can find the form.

## 2020-10-10 NOTE — ED Provider Notes (Signed)
Care of the patient assumed at the change of shift. Here with rapidly worsening mental status over the last few days, seen at PCP office 2 days ago, vitals were good then. Brought by son today. She had been living alone prior to this change. She was on Tramadol for shoulder pain but has not had any since yesterday.  Physical Exam  BP (!) 169/157   Pulse (!) 104   Temp (!) 97.3 F (36.3 C) (Oral)   Resp (!) 35   SpO2 (!) 84%   Physical Exam Awake but confused Wheezing, tachypnea Agitated ED Course/Procedures   Clinical Course as of 10/10/20 1841  Wed Oct 10, 2020  1646 CMP and lactic acid are neg. CBC with mild leukocytosis.  [CS]  [id number] Called to room for patient in worsening respiratory status, hypoxic on monitor to the 70s not improved with  oxygen but improved with NRB. CXR concerning for infiltrate, will give Abx for CAP. She is also wheezing, son reports she is a lifelong smoker, will give a neb. Dicussed code status with son, she is DNR/DNI.  [CS]  [id number] UA with nitrates but no other signs of infection.  [CS]  [id number] UDS is neg.  [CS]  [id number] Spoke with Dr. Curly Rim, Hospitalist, who will evaluate for admission. Patient remains very agitated/anxious, will give a low dose ativan.  [CS]  [id number] Covid is neg.  [CS]  1841 Lactic acid is mildly elevated, will order LR bolus.  [CS]    Clinical Course User Index [CS] Truddie Hidden, MD    .Critical Care  Date/Time: 10/10/2020 5:42 PM Performed by: Truddie Hidden, MD Authorized by: Truddie Hidden, MD   Critical care provider statement:    Critical care time (minutes):  45   Critical care was necessary to treat or prevent imminent or life-threatening deterioration of the following conditions:  Respiratory failure and CNS failure or compromise   Critical care was time spent personally by me on the following activities:  Discussions with consultants, evaluation of patient's response to treatment, examination of patient,  ordering and performing treatments and interventions, ordering and review of laboratory studies, ordering and review of radiographic studies, pulse oximetry, re-evaluation of patient's condition, obtaining history from patient or surrogate and review of old charts   I assumed direction of critical care for this patient from another provider in my specialty: yes     Care discussed with: admitting provider    MDM         Truddie Hidden, MD 10/10/20 1744

## 2020-10-10 NOTE — ED Triage Notes (Signed)
Pt BIB EMS when her neighbor called about AMS with a LKN 10 am yesterday morning when she spoke with her son. Yesterday afternoon her neighbor found her slightly confused after taking 2 tramadol pills, pt went to sleep and was found this morning more confused with a severe headache to the R side of her head and SOB . Recently put on tramadol and a beta blocker. EMS found her with slight R sided facial droop and aphasia. Vomited 1 time in route and was given 4 g of Zofran.    Recently diagnoses with CHF with an EF of 13% that is why she was given Tramadol and beta blockers   CBG 174 BP 210/110    189/93 93% RA  95% 2L  HR 69

## 2020-10-10 NOTE — ED Notes (Signed)
Admitting MD advised that she would not be able to speak to pt's son before he leaves for home. This RN insured son's phone number is correct in pt's chart; advised son that MD would call to update son on plan of care, and to call hospital if any further questions throughout the night.

## 2020-10-10 NOTE — ED Provider Notes (Signed)
[facility] EMERGENCY DEPARTMENT Provider Note   CSN: [id number] Arrival date & time: 10/10/20  1451     History Chief Complaint  Patient presents with   Altered Mental Status   Headache    Crystal Osborne is a 87 y.o. female.  HPI She presents for evaluation of altered mental status, worsening since this morning.  She is also complained of a headache and her son treated her with Tylenol.  Yesterday she was noted to have difficulty walking and be confused by family friend.  She saw her PCP, 2 days ago at that time was evaluated for gradual decline and malaise.  There were no specific intervention was undertaken at that time.  Is unable to give any history  Level 5 caveat-altered mental status    Past Medical History:  Diagnosis Date   AAA (abdominal aortic aneurysm) [facility])    sees Dr Eden Lathe    B12 deficiency    Cancer of right breast [facility]) [place] 11/28/2008   sees Dr Oneida Alar    COPD (chronic obstructive pulmonary disease) ([place]) 01/25/2014   CVA (cerebrovascular accident) ([place]) 2006   Diabetes mellitus ([place]) 05/29/2010   A1c 6.0 on October 2011    Hyperlipidemia    Hypertension    Osteoporosis 2003   Pneumonia ~ 2014; ~ 2015   Prolapse of female bladder, acquired    Shingles    repeated episone 9/11 went to a UC   TIA (transient ischemic attack) ~ 2005   Transient hypothyroidism    d/t thyroidectomy   UTI (lower urinary tract infection) May 2013    Patient Active Problem List   Diagnosis Date Noted   Shingles 07/29/2017   Syncope 07/25/2016   Claudication ([place]) 12/03/2015   PCP NOTES >>>>> 01/10/2015   COPD (chronic obstructive pulmonary disease) ([place]) 01/25/2014   B12 deficiency 01/25/2014   Anxiety and depression 07/26/2012   Insomnia 08/12/2011   Annual physical exam 05/29/2010   Hyperglycemia 05/29/2010   Diarrhea, chronic , on-off 05/29/2010   TOBACCO ABUSE 11/15/2009   Disorder of bladder 09/25/2009   SHOULDER  PAIN 04/10/2009   PERSONAL HX BREAST CANCER 12/12/2008   Carotid artery disease ([place]) 11/28/2008   PVD (peripheral vascular disease) ([place]) 04/18/2008   BLIND LOOP SYNDROME 11/09/2007   DIVERTICULOSIS, COLON 10/14/2007   COLONIC POLYPS 08/03/2006   Essential hypertension 08/03/2006   CVA 08/03/2006   OSTEOPOROSIS NOS 08/03/2006   THYROIDECTOMY, HX OF 08/03/2006   Hyperlipidemia 03/05/2006   Abdominal aortic aneurysm ([place]) 03/05/2006    Past Surgical History:  Procedure Laterality Date   ABDOMINAL HYSTERECTOMY     no oophorectomy   CATARACT EXTRACTION W/ INTRAOCULAR LENS  IMPLANT, BILATERAL Bilateral    DILATION AND CURETTAGE OF UTERUS     MASTECTOMY, RADICAL Right 1960s   remotely    PERIPHERAL VASCULAR CATHETERIZATION  12/03/2015   PERIPHERAL VASCULAR CATHETERIZATION N/A 12/03/2015   Procedure: Abdominal Aortogram;  Surgeon: Lorretta Harp, MD;  Location: [place] CV LAB;  Service: Cardiovascular;  Laterality: N/A;   PERIPHERAL VASCULAR CATHETERIZATION Bilateral 12/03/2015   Procedure: Lower Extremity Angiography;  Surgeon: Lorretta Harp, MD;  Location: [place] CV LAB;  Service: Cardiovascular;  Laterality: Bilateral;   PERIPHERAL VASCULAR CATHETERIZATION Bilateral 12/03/2015   Procedure: Peripheral Vascular Intervention;  Surgeon: Lorretta Harp, MD;  Location: [place] CV LAB;  Service: Cardiovascular;  Laterality: Bilateral;  44mx58mm [place] bilateral Common Iliac Arteries   PERIPHERAL VASCULAR CATHETERIZATION Bilateral  12/03/2015   Procedure: Peripheral Vascular Atherectomy;  Surgeon: Lorretta Harp, MD;  Location: [place] CV LAB;  Service: Cardiovascular;  Laterality: Bilateral;  Common Iliac Arteries   THYROIDECTOMY       OB History   No obstetric history on file.     Family History  Problem Relation Age of Onset   Cancer Other        sister (lung) , brother (mouth)   Colon cancer Neg Hx    Coronary artery disease Neg Hx    Diabetes type  II Neg Hx    Breast cancer Neg Hx     Social History   Tobacco Use   Smoking status: Every Day    Packs/day: 0.50    Years: 67.00    Pack years: 33.50    Types: Cigarettes   Smokeless tobacco: Never   Tobacco comments:    1/2 ppd  Substance Use Topics   Alcohol use: No    Alcohol/week: 0.0 standard drinks   Drug use: No    Home Medications Prior to Admission medications   Medication Sig Start Date End Date Taking? Authorizing Provider  acetaminophen (TYLENOL) 500 MG tablet Take 1,000 mg by mouth 2 (two) times daily as needed (for pain.).    [provider]  ALPRAZolam Duanne Moron) 0.25 MG tablet Take 1-2 tablets (0.25-0.5 mg total) by mouth at bedtime as needed for sleep. Patient not taking: No sig reported 07/07/18   Colon Branch, MD  AMBULATORY NON FORMULARY MEDICATION Right Breast Prosthesis  Dx: V15.29 09/29/12   Colon Branch, MD  aspirin EC 81 MG tablet Take 81 mg by mouth every evening.    [provider]  atorvastatin (LIPITOR) 40 MG tablet Take 1 tablet (40 mg total) by mouth daily. Patient taking differently: Take 40 mg by mouth at bedtime. 05/03/20   Colon Branch, MD  Cyanocobalamin (VITAMIN B 12 PO) Take 1,000 mg by mouth daily.    [provider]  metoprolol succinate (TOPROL XL) 25 MG 24 hr tablet Take 0.5 tablets (12.5 mg total) by mouth daily. 10/04/20   Deberah Pelton, NP  POTASSIUM PO Take 1 tablet by mouth daily. OTC    [provider]  psyllium (METAMUCIL SMOOTH TEXTURE) 28 % packet Take 1 packet by mouth 2 (two) times daily as needed (constipation).    [provider]    Allergies    Septra [sulfamethoxazole-trimethoprim], Ciprofloxacin hcl, and Codeine  Review of Systems   Review of Systems  Unable to perform ROS: Mental status change   Physical Exam Updated Vital Signs BP (!) 195/89   Pulse 68   Temp (!) 97.3 F (36.3 C) (Oral)   Resp (!) 23   SpO2 95%   Physical Exam Vitals and nursing note reviewed.   Constitutional:      General: She is not in acute distress.    Appearance: She is well-developed. She is ill-appearing. She is not toxic-appearing.  HENT:     Head: Normocephalic and atraumatic.     Right Ear: External ear normal.     Left Ear: External ear normal.     Nose: No congestion or rhinorrhea.  Eyes:     Conjunctiva/sclera: Conjunctivae normal.     Pupils: Pupils are equal, round, and reactive to light.  Neck:     Trachea: Phonation normal.  Cardiovascular:     Rate and Rhythm: Normal rate and regular rhythm.     Heart sounds: Normal heart  sounds.  Pulmonary:     Effort: Pulmonary effort is normal. No respiratory distress.     Breath sounds: Normal breath sounds. No stridor.  Abdominal:     General: There is no distension.     Palpations: Abdomen is soft.     Tenderness: There is no abdominal tenderness.  Musculoskeletal:        General: No swelling or tenderness. Normal range of motion.     Cervical back: Normal range of motion and neck supple.  Skin:    General: Skin is warm and dry.  Neurological:     Mental Status: She is alert.     Cranial Nerves: No cranial nerve deficit.     Sensory: No sensory deficit.     Motor: No abnormal muscle tone.     Coordination: Coordination normal.     Comments: She speaks softly and is confused.  No clear evidence for dysarthria.  Unable to specify if she is aphasic.  There is no focal asymmetry of the neurologic examination.    ED Results / Procedures / Treatments   Labs (all labs ordered are listed, but only abnormal results are displayed) Labs Reviewed  CULTURE, BLOOD (ROUTINE X 2)  CULTURE, BLOOD (ROUTINE X 2)  RESP PANEL BY RT-PCR (FLU A&B, COVID) [id number]  COMPREHENSIVE METABOLIC PANEL  CBC WITH DIFFERENTIAL/PLATELET  URINALYSIS, ROUTINE W REFLEX MICROSCOPIC  LACTIC ACID, PLASMA  LACTIC ACID, PLASMA  RAPID URINE DRUG SCREEN, HOSP PERFORMED  ETHANOL    EKG None  Radiology No results  found.  Procedures .Critical Care  Date/Time: 10/10/2020 4:12 PM Performed by: Daleen Bo, MD Authorized by: Daleen Bo, MD   Critical care provider statement:    Critical care time (minutes):  35   Critical care start time:  10/10/2020 2:55 AM   Critical care end time:  10/10/2020 4:12 PM   Critical care time was exclusive of:  Separately billable procedures and treating other patients   Critical care was necessary to treat or prevent imminent or life-threatening deterioration of the following conditions:  CNS failure or compromise   Critical care was time spent personally by me on the following activities:  Blood draw for specimens, development of treatment plan with patient or surrogate, discussions with consultants, evaluation of patient's response to treatment, examination of patient, obtaining history from patient or surrogate, ordering and performing treatments and interventions, ordering and review of laboratory studies, pulse oximetry, re-evaluation of patient's condition, review of old charts and ordering and review of radiographic studies   Medications Ordered in ED Medications  sodium chloride 0.9 % bolus 1,000 mL (has no administration in time range)    ED Course  I have reviewed the triage vital signs and the nursing notes.  Pertinent labs & imaging results that were available during my care of the patient were reviewed by me and considered in my medical decision making (see chart for details).    MDM Rules/Calculators/A&P                            Patient Vitals for the past 24 hrs:  BP Temp Temp src Pulse Resp SpO2  10/10/20 1515 (!) 195/89 -- -- 68 (!) 23 95 %  10/10/20 1509 -- -- -- -- -- 95 %  10/10/20 1504 (!) 192/85 (!) 97.3 F (36.3 C) Oral 96 (!) 26 96 %  10/10/20 1502 -- -- -- -- -- 96 %  10/10/20 1453 -- -- -- -- --  95 %      Medical Decision Making:  This patient is presenting for evaluation of altered mental status, which does require a  range of treatment options, and is a complaint that involves a high risk of morbidity and mortality. The differential diagnoses include CVA, metabolic disorder, dehydration, toxicity.. I decided to review old records, and in summary elderly female presenting with abrupt decline, following gradual decline, evaluated by her PCP 2 days ago.  I obtained additional historical information from patient's and at the bedside.  Clinical Laboratory Tests Ordered, included CBC, Metabolic panel, Urinalysis, and blood cultures, respiratory panel, lactate, ammonia, alcohol level .  Radiologic Tests Ordered, included chest x-ray, CT head.     Critical Interventions-clinical evaluation, laboratory testing, radiography, IV fluids, discussion with family member, sedation   CRITICAL CARE-yes Performed by: Daleen Bo  Nursing Notes Reviewed/ Care Coordinated Applicable Imaging Reviewed Interpretation of Laboratory Data incorporated into ED treatment  Care transferred to oncoming provider team to disposition after completion of ED evaluation.   Final Clinical Impression(s) / ED Diagnoses Final diagnoses:  Altered mental status, unspecified altered mental status type    Rx / DC Orders ED Discharge Orders     None        Daleen Bo, MD 10/10/20 [phone]

## 2020-10-10 NOTE — H&P (Signed)
[facility] History and Physical  Crystal Osborne [id number] DOB: 06/28/1931 DOA: 10/10/2020   PCP: Colon Branch, MD  Specialists: Dr. Gwenlyn Found is her cardiologist  Chief Complaint: Confusion  HPI: Crystal Osborne is a 87 y.o. female with a past medical history of essential hypertension, hypertrophic cardiomyopathy, tobacco abuse, history of COPD, hyperlipidemia who was in her usual state of health till the last few days when patient's son noticed that patient was confused.  She usually has normal cognitive function at baseline.  Denies any history of dementia.  Patient is quite agitated and unable to provide any information at this time.  Most of the history was obtained from the son.  Hence history is limited.  Patient was experiencing some shoulder discomfort about 2 weeks ago for which she was prescribed tramadol.  She was prescribed 25 tablets and about 11 tablets left in the bottle.  Recently she was changed over from lisinopril to metoprolol after she was seen at the cardiology clinic.  She had an echocardiogram recently which showed hypertrophic cardiomyopathy.  No clear history of fever or chills.  No nausea or vomiting.  No recent falls or injuries.  In the emergency department she was found to have a temperature of 97.3.  Noted to be mildly tachypneic and hypertensive.  Initially saturations were noted to be 96% on 2 L.  Subsequently she was noted to be more hypoxic with saturations in the 80s.  She was placed on a nonrebreather.  Chest x-ray raised concern for left-sided pneumonia.  COVID-19 test was negative.  WBC was 12.9.  Lactic acid level 1.8.  UA did not suggest infection.  She is clearly delirious.  She will need hospitalization for further management.  Home Medications: Prior to Admission medications   Medication Sig Start Date End Date Taking? Authorizing Provider  acetaminophen (TYLENOL) 500 MG tablet Take 1,000 mg by mouth 2 (two) times daily as needed (for pain.).   Yes  [provider]  aspirin EC 81 MG tablet Take 81 mg by mouth every evening.   Yes [provider]  atorvastatin (LIPITOR) 40 MG tablet Take 1 tablet (40 mg total) by mouth daily. Patient taking differently: Take 40 mg by mouth at bedtime. 05/03/20  Yes Paz, Alda Berthold, MD  metoprolol succinate (TOPROL XL) 25 MG 24 hr tablet Take 0.5 tablets (12.5 mg total) by mouth daily. 10/04/20  Yes Cleaver, Jossie Ng, NP  POTASSIUM PO Take 1 tablet by mouth daily. OTC   Yes [provider]  psyllium (METAMUCIL SMOOTH TEXTURE) 28 % packet Take 1 packet by mouth 2 (two) times daily as needed (constipation).   Yes [provider]  ALPRAZolam (XANAX) 0.25 MG tablet Take 1-2 tablets (0.25-0.5 mg total) by mouth at bedtime as needed for sleep. Patient not taking: No sig reported 07/07/18   Colon Branch, MD  AMBULATORY NON FORMULARY MEDICATION Right Breast Prosthesis  Dx: V15.29 09/29/12   Colon Branch, MD    Allergies:  Allergies  Allergen Reactions   Septra [Sulfamethoxazole-Trimethoprim] Itching    Pt states she had a severe allergic reaction; she was in the ICU for 2 days because of taking this.   Ciprofloxacin Hcl Itching   Codeine Rash    Made her feel very strange    Past Medical History: Past Medical History:  Diagnosis Date   AAA (abdominal aortic aneurysm) [facility])    sees Dr Eden Lathe    B12 deficiency    Cancer of right  breast [facility]) 1960   CAROTID ARTERY DISEASE 11/28/2008   sees Dr Oneida Alar    COPD (chronic obstructive pulmonary disease) ([place]) 01/25/2014   CVA (cerebrovascular accident) ([place]) 2006   Diabetes mellitus ([place]) 05/29/2010   A1c 6.0 on October 2011    Hyperlipidemia    Hypertension    Osteoporosis 2003   Pneumonia ~ 2014; ~ 2015   Prolapse of female bladder, acquired    Shingles    repeated episone 9/11 went to a UC   TIA (transient ischemic attack) ~ 2005   Transient hypothyroidism    d/t thyroidectomy   UTI (lower urinary tract infection) May 2013     Past Surgical History:  Procedure Laterality Date   ABDOMINAL HYSTERECTOMY     no oophorectomy   CATARACT EXTRACTION W/ INTRAOCULAR LENS  IMPLANT, BILATERAL Bilateral    DILATION AND CURETTAGE OF UTERUS     MASTECTOMY, RADICAL Right 1960s   remotely    PERIPHERAL VASCULAR CATHETERIZATION  12/03/2015   PERIPHERAL VASCULAR CATHETERIZATION N/A 12/03/2015   Procedure: Abdominal Aortogram;  Surgeon: Lorretta Harp, MD;  Location: [place] [facility];  Service: Cardiovascular;  Laterality: N/A;   PERIPHERAL VASCULAR CATHETERIZATION Bilateral 12/03/2015   Procedure: Lower Extremity Angiography;  Surgeon: Lorretta Harp, MD;  Location: [place] [facility];  Service: Cardiovascular;  Laterality: Bilateral;   PERIPHERAL VASCULAR CATHETERIZATION Bilateral 12/03/2015   Procedure: Peripheral Vascular Intervention;  Surgeon: Lorretta Harp, MD;  Location: [place] [facility];  Service: Cardiovascular;  Laterality: Bilateral;  52mx58mm [place] bilateral Common Iliac Arteries   PERIPHERAL VASCULAR CATHETERIZATION Bilateral 12/03/2015   Procedure: Peripheral Vascular Atherectomy;  Surgeon: JLorretta Harp MD;  [facility];  Service: Cardiovascular;  Laterality: Bilateral;  Common Iliac Arteries   THYROIDECTOMY      Social History: Lives by herself usually.  Does everything independently except driving.  Has been a lifelong smoker and continues to smoke half to 1 pack of cigarettes on a daily basis.  Son denies any history of alcohol abuse or recreational drug use.  Family History:  Family History  Problem Relation Age of Onset   Cancer Other        sister (lung) , brother (mouth)   Colon cancer Neg Hx    Coronary artery disease Neg Hx    Diabetes type II Neg Hx    Breast cancer Neg Hx      Review of Systems -unable  to do due to her altered mental status  Physical Examination  Vitals:   10/10/20 1630 10/10/20 1700 10/10/20 1720 10/10/20 1743  BP: (!) 193/96 (!)  169/157 (!) 167/130   Pulse: 81 (!) 104 (!) 38 (!) 117  Resp: (!) 21 (!) 35 (!) 35 19  Temp:      TempSrc:      SpO2: (!) 88% (!) 84% 100% 97%    BP (!) 167/130   Pulse (!) 117   Temp (!) 97.3 F (36.3 C) (Oral)   Resp 19   SpO2 97%   General appearance: alert, delirious, and no distress Head: Normocephalic, without obvious abnormality, atraumatic Eyes: conjunctivae/corneas clear. PERRL, EOM's intact. Throat: Dry mucous membranes noted Neck: no adenopathy, no carotid bruit, no JVD, supple, symmetrical, trachea midline, and thyroid not enlarged, symmetric, no tenderness/mass/nodules Resp: Noted to be tachypneic.  Crackles left base.  Occasional wheezing is appreciated.  No rhonchi.  No use of accessory muscles. Cardio: S1-S2 is tachycardic regular.  No S3-S4.  No  rubs murmurs or bruit.  No significant pedal edema GI: soft, non-tender; bowel sounds normal; no masses,  no organomegaly Extremities: extremities normal, atraumatic, no cyanosis or edema Pulses: 2+ and symmetric Skin: Skin color, texture, turgor normal. No rashes or lesions Lymph nodes: Cervical, supraclavicular, and axillary nodes normal. Neurologic: She is alert.  Delirious.  No facial asymmetry.  Moving all of her extremities equally.  No obvious focal deficits noted.    Labs on Admission: I have personally reviewed following labs and imaging studies  CBC: Recent Labs  Lab 10/10/20 1524  WBC 12.9*  NEUTROABS 10.1*  HGB 14.1  HCT 43.2  MCV 93.9  PLT 99991111   Basic Metabolic Panel: Recent Labs  Lab 10/10/20 1524  NA 136  K 5.1  CL 97*  CO2 30  GLUCOSE 155*  BUN 15  CREATININE 0.97  CALCIUM 8.8*   GFR: Estimated Creatinine Clearance: 33.7 mL/min (by C-G formula based on SCr of 0.97 mg/dL). Liver Function Tests: Recent Labs  Lab 10/10/20 1524  AST 28  ALT 30  ALKPHOS 76  BILITOT 1.4*  PROT 6.1*  ALBUMIN 3.3*      Radiological Exams on Admission: DG Chest 1 View  Result Date:  10/10/2020 CLINICAL DATA:  Altered mental status, shortness of breath EXAM: CHEST  1 VIEW COMPARISON:  Chest radiograph 11/09/2015 FINDINGS: The heart is enlarged. The mediastinal contours are within normal limits. There is calcified atherosclerotic plaque of the aortic arch. There are coarsened interstitial markings throughout both lungs, likely chronic. There is a small left pleural effusion with adjacent airspace disease. The right costophrenic angle is cut off. There is no overt pulmonary edema. There is no pneumothorax. There is no acute osseous abnormality. IMPRESSION: 1. Small left pleural effusion with adjacent airspace disease which may reflect atelectasis or pneumonia. 2. Mild cardiomegaly. Electronically Signed   By: Valetta Mole M.D.   On: 10/10/2020 16:01    My interpretation of Electrocardiogram: QTC was noted to be normal.  Initial EKG showed sinus rhythm in the 80s.  Nonspecific T wave changes noted.  Nonspecific ST changes.  Not a good quality EKG.  Changes however noted to be similar to previous EKGs.   Problem List  Principal Problem:   CAP (community acquired pneumonia) Active Problems:   TOBACCO ABUSE   Essential hypertension   COPD (chronic obstructive pulmonary disease) (HCC)   Acute metabolic encephalopathy   Hypertrophic cardiomyopathy (HCC)   Assessment: This is a 87 year old Caucasian female with past medical history as stated earlier who comes in with a few day history of progressively worsening confusion.  Noted to be hypoxic in the emergency department.  Chest x-ray raised concern for pneumonia.  She was placed on tramadol recently for shoulder pain which could be the reason behind her mental status changes.  No focal neurological deficits appreciated making stroke less likely.  Plan:  #1. Acute metabolic encephalopathy/acute delirium: Likely multifactorial including medication (tramadol), infection (pneumonia).  We will do metabolic work-up and check [id number], folic  acid, TSH RPR.  UA shows positive nitrite but negative leukocytes.  No significant WBC is noted.  Unlikely to be a UTI.  Urine drug screen was unremarkable.  CT head is still pending.  We will gently hydrate her.  We will give her thiamine.  We will treat her pneumonia.  Follow-up on CT scan report.  Haldol can be used as needed for agitation.  PT and OT evaluation when more stable.  #2. Community-acquired pneumonia/acute respiratory failure with  hypoxia: Difficult to get accurate saturation levels due to her agitation.  Place her on oxygen for now and wean down as tolerated.  Continue with ceftriaxone and azithromycin.  Follow-up blood cultures.  Lactic acid level was normal.  COVID-19 test and influenza was negative.  Check procalcitonin.  #3. Recently diagnosed hypertrophic cardiomyopathy/essential hypertension: Seen by cardiology office recently.  Her lisinopril was changed over to metoprolol.  Monitor blood pressures closely.  Noted to be elevated here in the ED likely due to her agitation.  Will give her hydralazine as needed.  #4. History of COPD with ongoing tobacco abuse: Continues to smoke cigarettes on a daily basis.  Will need to be counseled when she is back to baseline.  Nebulizer treatments as needed.  No indication for steroids currently.  Nicotine patch.  #5. Hyperlipidemia: Resume statin when she is slightly better from mentation standpoint.   DVT Prophylaxis: Lovenox Code Status: DNR based on my conversation with patient's son Family Communication: Discussed with patient's son Disposition: Hopefully return home when improved Consults called: None at this time Admission Status: Status is: Inpatient  Remains inpatient appropriate because:Altered mental status, IV treatments appropriate due to intensity of illness or inability to take PO, and Inpatient level of care appropriate due to severity of illness  Dispo: The patient is from: Home              Anticipated d/c is to: Home               Patient currently is not medically stable to d/c.   Difficult to place patient No     Severity of Illness: The appropriate patient status for this patient is INPATIENT. Inpatient status is judged to be reasonable and necessary in order to provide the required intensity of service to ensure the patient's safety. The patient's presenting symptoms, physical exam findings, and initial radiographic and laboratory data in the context of their chronic comorbidities is felt to place them at high risk for further clinical deterioration. Furthermore, it is not anticipated that the patient will be medically stable for discharge from the hospital within 2 midnights of admission. The following factors support the patient status of inpatient.   " The patient's presenting symptoms include altered mental status. " The worrisome physical exam findings include crackles in the lungs, tachycardia, hypoxia. " The initial radiographic and laboratory data are worrisome because of pneumonia. " The chronic co-morbidities include hypertrophic cardiomyopathy.   * I certify that at the point of admission it is my clinical judgment that the patient will require inpatient hospital care spanning beyond 2 midnights from the point of admission due to high intensity of service, high risk for further deterioration and high frequency of surveillance required.*   Further management decisions will depend on results of further testing and patient's response to treatment.   Elie Leppo [employer]  [facility] operational officer on [email or web address]  10/10/2020, 6:07 PM

## 2020-10-10 NOTE — ED Notes (Signed)
Patient transported to CT 

## 2020-10-10 NOTE — Progress Notes (Addendum)
Informed by RN that the patient's son wants an update.  I have already spoken to Mr. Crystal Osborne (number listed in the chart) and have updated him.  He confirms that the patient is DNR.  Please update patient's son again in the morning.  Addendum: Spoke to Mr. Crystal Osborne

## 2020-10-10 NOTE — ED Notes (Signed)
Admitting MD paged again regarding son's request for update

## 2020-10-10 NOTE — ED Notes (Signed)
Admitting MD paged regarding son's request for update

## 2020-10-10 NOTE — ED Notes (Signed)
Pt's son called, updated on pt's bed status, advised of visiting hours upstairs.

## 2020-10-10 NOTE — ED Notes (Signed)
Report attempted x2

## 2020-10-11 LAB — ETHANOL: Alcohol, Ethyl (B): <10 mg/dL (ref <10)

## 2020-10-11 LAB — GLUCOSE, CAPILLARY: Glucose-Capillary: 106 mg/dL — ABNORMAL HIGH (ref 70–99)

## 2020-10-11 LAB — CBC
HCT: 35.6 % — ABNORMAL LOW (ref 36.0–46.0)
Hemoglobin: 11.7 g/dL — ABNORMAL LOW (ref 12.0–15.0)
MCH: 31 pg (ref 26.0–34.0)
MCHC: 32.9 g/dL (ref 30.0–36.0)
MCV: 94.2 fL (ref 80.0–100.0)
Platelets: 175 10*3/uL (ref 150–400)
RBC: 3.78 MIL/uL — ABNORMAL LOW (ref 3.87–5.11)
RDW: 14.6 % (ref 11.5–15.5)
WBC: 13.8 10*3/uL — ABNORMAL HIGH (ref 4.0–10.5)
nRBC: 0 % (ref 0.0–0.2)

## 2020-10-11 LAB — RPR: RPR Ser Ql: NONREACTIVE

## 2020-10-11 LAB — COMPREHENSIVE METABOLIC PANEL
ALT: 120 U/L — ABNORMAL HIGH (ref 0–44)
AST: 112 U/L — ABNORMAL HIGH (ref 15–41)
Albumin: 2.4 g/dL — ABNORMAL LOW (ref 3.5–5.0)
Alkaline Phosphatase: 112 U/L (ref 38–126)
Anion gap: 8 (ref 5–15)
BUN: 18 mg/dL (ref 8–23)
CO2: 23 mmol/L (ref 22–32)
Calcium: 7.6 mg/dL — ABNORMAL LOW (ref 8.9–10.3)
Chloride: 104 mmol/L (ref 98–111)
Creatinine, Ser: 0.94 mg/dL (ref 0.44–1.00)
GFR, Estimated: 59 mL/min — ABNORMAL LOW (ref >60)
Glucose, Bld: 114 mg/dL — ABNORMAL HIGH (ref 70–99)
Potassium: 4.4 mmol/L (ref 3.5–5.1)
Sodium: 135 mmol/L (ref 135–145)
Total Bilirubin: 0.9 mg/dL (ref 0.3–1.2)
Total Protein: 4.6 g/dL — ABNORMAL LOW (ref 6.5–8.1)

## 2020-10-11 LAB — HIV ANTIBODY (ROUTINE TESTING W REFLEX): HIV Screen 4th Generation wRfx: NONREACTIVE

## 2020-10-11 LAB — FOLATE: Folate: 15.7 ng/mL (ref >5.9)

## 2020-10-11 LAB — MRSA NEXT GEN BY PCR, NASAL: MRSA by PCR Next Gen: NOT DETECTED

## 2020-10-11 LAB — PROCALCITONIN: Procalcitonin: 0.57 ng/mL

## 2020-10-11 LAB — TSH: TSH: 1.765 u[IU]/mL (ref 0.350–4.500)

## 2020-10-11 LAB — VITAMIN B12: Vitamin B-12: 716 pg/mL (ref 180–914)

## 2020-10-11 LAB — LACTIC ACID, PLASMA: Lactic Acid, Venous: 1.5 mmol/L (ref 0.5–1.9)

## 2020-10-11 MED ORDER — ORAL CARE MOUTH RINSE
15.0000 mL | Freq: Two times a day (BID) | OROMUCOSAL | Status: DC
  Administered 2020-10-11 – 2020-10-19 (×16): 15 mL via OROMUCOSAL

## 2020-10-11 NOTE — ED Notes (Signed)
Report attempted multiple times to multiple numbers unsuccessfully. Bedside report given in 2W11

## 2020-10-11 NOTE — Progress Notes (Signed)
Pt. Was lethargic but was easy to arouse and would follow basic instructions. Pt. Son states she is much improved since yesterday. Pt. Son states she is usually alert and oriented and lives by herself. They want pt. To [facility]hab. Pt. O2 sats would decrease into the 80s during adls. Pt. Will need ed on use of ae for LE adls. And further ed on breathing exercises and energy conservation. Acute ot to follow.   10/11/20 1100  OT Visit Information  Last OT Received On 10/11/20  Assistance Needed +1  History of Present Illness 87 yo admitted 8/31 with AMS and hypoxia with head CT (-). PMhx: HTN, cardiomyopathy, COPD, HLD, DM  Precautions  Precautions Fall  Precaution Comments 1 fall last week.  Restrictions  Weight Bearing Restrictions No  Home Living  Family/patient expects to be discharged to: Private residence  Living Arrangements Alone  Available Help at Discharge Family;Available PRN/intermittently  Type of [place] entrance  Home Layout Two level;Able to live on main level with bedroom/bathroom  Bathroom Shower/Tub Tub/shower unit  Tax adviser - 2 wheels;Cane - single point;Shower seat;BSC  Prior Function  Level of Independence Independent with assistive device(s)  Comments family would do her shopping and lawn care. pt. did mostly microwave cooking. she did mostly sponge bath.  Communication  Communication No difficulties  Pain Assessment  Pain Assessment No/denies pain  Cognition  Arousal/Alertness Lethargic  Behavior During Therapy WFL for tasks assessed/performed  Overall Cognitive Status Impaired/Different from baseline  Area of Impairment Orientation  Orientation Level Place;Situation  Upper Extremity Assessment  Upper Extremity Assessment Generalized weakness  Lower Extremity Assessment  Lower Extremity Assessment Defer to PT evaluation  ADL  Overall ADL's  Needs assistance/impaired  Eating/Feeding  Set up;Sitting  Grooming Wash/dry hands;Wash/dry face;Brushing hair;Minimal assistance;Sitting  Upper Body Bathing Moderate assistance;Sitting  Lower Body Bathing Maximal assistance;Sit to/from stand  Upper Body Dressing  Moderate assistance;Sitting  Lower Body Dressing Maximal assistance;Sit to/from Retail buyer Minimal assistance  Toileting- Clothing Manipulation and Hygiene Moderate assistance;Sit to/from stand  Functional mobility during ADLs Minimal assistance;Rolling walker  General ADL Comments Pt. preformed adl sitting in recliner chair and o2 sats would drop into 80s. pt. will need ed on use of ae and further ed on energy conservation.  Vision- History  Baseline Vision/History 1 Wears glasses  Ability to See in Adequate Light 0 Adequate  Patient Visual Report No change from baseline  Vision- Assessment  Vision Assessment? No apparent visual deficits  Transfers  Overall transfer level Needs assistance  Equipment used Rolling walker (2 wheeled)  Transfers Sit to/from Stand  Sit to Stand Min assist  Balance  Standing balance-Leahy Scale Poor  OT - End of Session  Equipment Utilized During Treatment Rolling walker;Gait belt  Activity Tolerance Patient tolerated treatment well;Patient limited by lethargy  Patient left in chair;with call bell/phone within reach;with chair alarm set;with family/visitor present  Nurse Communication  (ok therapy)  OT Assessment  OT Recommendation/Assessment Patient needs continued OT Services  OT Visit Diagnosis Unsteadiness on feet (R26.81)  OT Problem List Decreased activity tolerance;Impaired balance (sitting and/or standing);Decreased safety awareness;Decreased knowledge of use of DME or AE;Cardiopulmonary status limiting activity  Barriers to Discharge Decreased caregiver support  OT Plan  OT Frequency (ACUTE ONLY) Min 2X/week  OT Treatment/Interventions (ACUTE ONLY) Self-care/ADL training;Energy conservation;DME and/or AE  instruction;Therapeutic activities;Patient/family education  AM-PAC OT "6 Clicks" Daily Activity Outcome Measure (Version  2)  Help from another person eating meals? 3  Help from another person taking care of personal grooming? 3  Help from another person toileting, which includes using toliet, bedpan, or urinal? 2  Help from another person bathing (including washing, rinsing, drying)? 2  Help from another person to put on and taking off regular upper body clothing? 2  Help from another person to put on and taking off regular lower body clothing? 2  6 Click Score 14  Progressive Mobility  What is the highest level of mobility based on the progressive mobility assessment? Level 3 (Stands with assist) - Balance while standing  and cannot march in place  Mobility Ambulated with assistance in room  OT Recommendation  Follow Up Recommendations SNF  OT Equipment None recommended by OT  Individuals Consulted  Consulted and Agree with Results and Recommendations Patient  Acute Rehab OT Goals  Patient Stated Goal to get stronger  OT Goal Formulation With patient  Time For Goal Achievement 10/25/20  Potential to Achieve Goals Good  OT Time Calculation  OT Start Time (ACUTE ONLY) 1120  OT Stop Time (ACUTE ONLY) 1147  OT Time Calculation (min) 27 min  OT General Charges  $OT Visit 1 Visit  OT Evaluation  $OT Eval Moderate Complexity 1 Mod  Written Expression  Dominant Hand Right  Reece Packer OT/L

## 2020-10-11 NOTE — ED Notes (Signed)
Multiple report

## 2020-10-11 NOTE — TOC Initial Note (Signed)
Transition of Care [facility]) - Initial/Assessment Note    Patient Details  Name: Crystal Osborne MRN: [id number] Date of Birth: [birth year]  [facility] CM/SW Contact:    Joanne Chars, LCSW Phone Number: 10/11/2020, 3:09 PM  Clinical Narrative:  CSW met with pt regarding recommendation for SNF.  Pt able to provide information for initial assessment.  Permission given to speak with son Crystal Osborne.  Pt agreeable to SNF, choice document given.  Pt is vaccinated for covid, but no booster.    CSW spoke with son Crystal Osborne who reports that he has a contact at [facility] and would like pt referred there.  CSW agreed, also discussed that [facility] often full, son will consider other facilities if pt is not accepted at [facility].                   Expected Discharge Plan: Skilled Nursing Facility Barriers to Discharge: Continued Medical Work up, SNF Pending bed offer   Patient Goals and CMS Choice Patient states their goals for this hospitalization and ongoing recovery are:: "back to where I was" CMS Medicare.gov Compare Post Acute Care list provided to:: Patient Choice offered to / list presented to : Patient  Expected Discharge Plan and Services Expected Discharge Plan: [place] Choice: [place] arrangements for the past 2 months: Single Family Home                                      Prior Living Arrangements/Services Living arrangements for the past 2 months: Single Family Home Lives with:: Self Patient language and need for interpreter reviewed:: Yes Do you feel safe going back to the place where you live?: Yes      Need for Family Participation in Patient Care: Yes (Comment) Care giver support system in place?: Yes (comment) Current home services: Other (comment) (na) Criminal Activity/Legal Involvement Pertinent to Current Situation/Hospitalization: No - Comment as needed  Activities of Daily Living       Permission Sought/Granted Permission sought to share information with : Family Supports Permission granted to share information with : Yes, Verbal Permission Granted  Share Information with NAME: son Crystal Osborne  Permission granted to share info w AGENCY: SNF        Emotional Assessment Appearance:: Appears stated age Attitude/Demeanor/Rapport: Engaged Affect (typically observed): Appropriate, Pleasant Orientation: : Oriented to Self, Oriented to Place Alcohol / Substance Use: Not Applicable Psych Involvement: No (comment)  Admission diagnosis:  Delirium [R41.0] CAP (community acquired pneumonia) [J18.9] Acute respiratory failure with hypoxia ([place]) [J96.01] Altered mental status, unspecified altered mental status type [R41.82] Community acquired pneumonia, unspecified laterality [J18.9] Patient Active Problem List   Diagnosis Date Noted   CAP (community acquired pneumonia) 34/28/7681   Acute metabolic encephalopathy 15/72/6203   Hypertrophic cardiomyopathy ([place]) 10/10/2020   Shingles 07/29/2017   Syncope 07/25/2016   Claudication ([place]) 12/03/2015   PCP NOTES >>>>> 01/10/2015   COPD (chronic obstructive pulmonary disease) ([place]) 01/25/2014   B12 deficiency 01/25/2014   Anxiety and depression 07/26/2012   Insomnia 08/12/2011   Annual physical exam 05/29/2010   Hyperglycemia 05/29/2010   Diarrhea, chronic , on-off 05/29/2010   TOBACCO ABUSE 11/15/2009   Disorder of bladder 09/25/2009   SHOULDER PAIN 04/10/2009   PERSONAL HX BREAST CANCER 12/12/2008   Carotid artery disease ([place]) 11/28/2008   PVD (peripheral vascular disease) (  [place]) 04/18/2008   BLIND LOOP SYNDROME 11/09/2007   DIVERTICULOSIS, COLON 10/14/2007   COLONIC POLYPS 08/03/2006   Essential hypertension 08/03/2006   CVA 08/03/2006   OSTEOPOROSIS NOS 08/03/2006   THYROIDECTOMY, HX OF 08/03/2006   Hyperlipidemia 03/05/2006   Abdominal aortic aneurysm ([place]) 03/05/2006   PCP:  Colon Branch, MD Pharmacy:   [facility]  DRUG STORE [phone] [address], [address] AT [facility] [address] [place] [address] Phone: [phone] Fax: [phone]     Social Determinants of Health ([place]) Interventions    Readmission Risk Interventions No flowsheet data found.

## 2020-10-11 NOTE — Progress Notes (Signed)
PROGRESS NOTE  Crystal Osborne [id number] DOB: Sep 08, 1931 DOA: 10/10/2020 PCP: Colon Branch, MD   LOS: 1 day   Brief Narrative / Interim history: 87 year old female with HTN, HOCM, tobacco use, COPD, hyperlipidemia comes into the hospital brought by her son that she was increasingly confused over the last 2 days.  Normally has no dementia and is alert and oriented x4 with normal cognitive function at baseline.  She was delirious in the ED, agitated.  Patient was experiencing some shoulder discomfort about 2 weeks ago for which she was prescribed tramadol.  She was prescribed 25 tablets and about 11 tablets left in the bottle.  Recently she was changed over from lisinopril to metoprolol after she was seen at the cardiology clinic.  She had an echocardiogram recently which showed hypertrophic cardiomyopathy.  No clear history of fever or chills.  No nausea or vomiting.  No recent falls or injuries.  She was diagnosed with left-sided lobar pneumonia and was admitted to the hospital.  COVID-19 was negative.  Subjective / 24h Interval events: She is calm this morning, tells me that she is doing well.  She says "I died and went to heaven and back".  Tells me that she was experiencing shortness of breath and cough prior to coming to the hospital but her breathing is better this morning  Assessment & Plan: Principal Problem Acute metabolic encephalopathy, acute delirium-possibly multifactorial due to medication with tramadol, infectious process. -She seems to be clearing up a little bit better this morning, continue antibiotics.  Active Problems Acute hypoxic respiratory failure due to community-acquired pneumonia -She was on nonrebreather at 1 point but has been weaned back down to 2 L this morning.  Sats are okay at rest -Continue broad-spectrum antibiotics with ceftriaxone and azithromycin, monitor cultures  Recently diagnosed hypertrophic cardiomyopathy-continue metoprolol  COPD, ongoing tobacco  use-smokes half a pack a day, son tells me that she will never be able to quit  Hyperlipidemia-continue statin due to LFT elevation  Elevated LFT-possibly due to acute illness, monitor  Goals of care-discussed with the son at bedside, he confirms DNR/DNI at this point.  He notes that she has been declining and feels like she has less than a year left.  Patient also expressed at times that she is ready.  Scheduled Meds:  enoxaparin (LOVENOX) injection  40 mg Subcutaneous Q24H   mouth rinse  15 mL Mouth Rinse BID   metoprolol succinate  12.5 mg Oral Daily   nicotine  14 mg Transdermal Q24H   thiamine  100 mg Oral Daily   Continuous Infusions:  azithromycin     cefTRIAXone (ROCEPHIN)  IV     PRN Meds:.acetaminophen **OR** acetaminophen, haloperidol lactate, hydrALAZINE, ipratropium-albuterol, ondansetron **OR** ondansetron (ZOFRAN) IV  Diet Orders (From admission, onward)     Start     Ordered   10/10/20 1803  DIET DYS 3 Room service appropriate? Yes; Fluid consistency: Thin  Diet effective now       Question Answer Comment  Room service appropriate? Yes   Fluid consistency: Thin      10/10/20 1806            DVT prophylaxis: enoxaparin (LOVENOX) injection 40 mg Start: 10/10/20 1830     Code Status: DNR  Family Communication: Son [name] at bedside  Status is: Inpatient  Remains inpatient appropriate because:Inpatient level of care appropriate due to severity of illness  Dispo:  Patient From: Home  Planned Disposition: SNF  Medically stable for discharge:  No    Level of care: Progressive  Consultants:  none  Procedures:  none  Microbiology  none  Antimicrobials: Ceftriaxone  / Azithromycin 8/31 >>    Objective: Vitals:   10/11/20 0616 10/11/20 0732 10/11/20 1000 10/11/20 1002  BP:  (!) 148/64  (!) 162/64  Pulse: (!) 58 (!) 58 67   Resp: (!) 23 (!) 21    Temp:      TempSrc:      SpO2: 98% 93% 94%     Intake/Output Summary (Last 24 hours) at  10/11/2020 1037 Last data filed at 10/11/2020 0618 Gross per 24 hour  Intake 3706.29 ml  Output --  Net 3706.29 ml   There were no vitals filed for this visit.  Examination:  Constitutional: Frail-appearing, in no distress Eyes: no scleral icterus ENMT: Mucous membranes are moist.  Neck: normal, supple Respiratory: Distant breath sounds throughout but overall clear without wheezing or crackles, normal respiratory effort Cardiovascular: Regular rate and rhythm, no murmurs / rubs / gallops. No LE edema. Good peripheral pulses Abdomen: non distended, no tenderness. Bowel sounds positive.  Musculoskeletal: no clubbing / cyanosis.  Skin: no rashes Neurologic: No focal deficits Psychiatric: Normal judgment and insight. Alert and oriented x 3.   Data Reviewed: I have independently reviewed following labs and imaging studies   CBC: Recent Labs  Lab 10/10/20 1524 10/11/20 0042  WBC 12.9* 13.8*  NEUTROABS 10.1*  --   HGB 14.1 11.7*  HCT 43.2 35.6*  MCV 93.9 94.2  PLT 238 0000000   Basic Metabolic Panel: Recent Labs  Lab 10/10/20 1524 10/11/20 0042  NA 136 135  K 5.1 4.4  CL 97* 104  CO2 30 23  GLUCOSE 155* 114*  BUN 15 18  CREATININE 0.97 0.94  CALCIUM 8.8* 7.6*   Liver Function Tests: Recent Labs  Lab 10/10/20 1524 10/11/20 0042  AST 28 112*  ALT 30 120*  ALKPHOS 76 112  BILITOT 1.4* 0.9  PROT 6.1* 4.6*  ALBUMIN 3.3* 2.4*   Coagulation Profile: No results for input(s): INR, PROTIME in the last 168 hours. HbA1C: No results for input(s): HGBA1C in the last 72 hours. CBG: Recent Labs  Lab 10/11/20 0010  GLUCAP 106*    Recent Results (from the past 240 hour(s))  Culture, blood (routine x 2)     Status: None (Preliminary result)   Collection Time: 10/10/20  3:00 PM   Specimen: BLOOD  Result Value Ref Range Status   Specimen Description BLOOD RIGHT ANTECUBITAL  Final   Special Requests   Final    BOTTLES DRAWN AEROBIC AND ANAEROBIC Blood Culture adequate  volume   Culture   Final    NO GROWTH < 24 HOURS Performed at [facility], [address], [place], [place] [address]    Report Status PENDING  Incomplete  Resp Panel by RT-PCR (Flu A&B, Covid) Nasopharyngeal Swab     Status: None   Collection Time: 10/10/20  3:29 PM   Specimen: Nasopharyngeal Swab; Nasopharyngeal(NP) swabs in vial transport medium  Result Value Ref Range Status   SARS Coronavirus 2 by RT PCR NEGATIVE NEGATIVE Final    Comment: (NOTE) SARS-CoV-2 target nucleic acids are NOT DETECTED.  The SARS-CoV-2 RNA is generally detectable in upper respiratory specimens during the acute phase of infection. The lowest concentration of SARS-CoV-2 viral copies this assay can detect is 138 copies/mL. A negative result does not preclude SARS-Cov-2 infection and should not be used as the sole basis for  treatment or other patient management decisions. A negative result may occur with  improper specimen collection/handling, submission of specimen other than nasopharyngeal swab, presence of viral mutation(s) within the areas targeted by this assay, and inadequate number of viral copies(<138 copies/mL). A negative result must be combined with clinical observations, patient history, and epidemiological information. The expected result is Negative.  Fact Sheet for Patients:  EntrepreneurPulse.com.au  Fact Sheet for Healthcare Providers:  IncredibleEmployment.be  This test is no t yet approved or cleared by the [place] FDA and  has been authorized for detection and/or diagnosis of SARS-CoV-2 by FDA under an Emergency Use Authorization (EUA). This EUA will remain  in effect (meaning this test can be used) for the duration of the COVID-19 declaration under Section 564(b)(1) of the Act, 21 U.S.C.section 360bbb-3(b)(1), unless the authorization is terminated  or revoked sooner.       Influenza A by PCR NEGATIVE NEGATIVE Final   Influenza  B by PCR NEGATIVE NEGATIVE Final    Comment: (NOTE) The Xpert Xpress SARS-CoV-2/FLU/RSV plus assay is intended as an aid in the diagnosis of influenza from Nasopharyngeal swab specimens and should not be used as a sole basis for treatment. Nasal washings and aspirates are unacceptable for Xpert Xpress SARS-CoV-2/FLU/RSV testing.  Fact Sheet for Patients: EntrepreneurPulse.com.au  Fact Sheet for Healthcare Providers: IncredibleEmployment.be  This test is not yet approved or cleared by the [place] FDA and has been authorized for detection and/or diagnosis of SARS-CoV-2 by FDA under an Emergency Use Authorization (EUA). This EUA will remain in effect (meaning this test can be used) for the duration of the COVID-19 declaration under Section 564(b)(1) of the Act, 21 U.S.C. section 360bbb-3(b)(1), unless the authorization is terminated or revoked.  Performed at [facility], [place] [address], [place], [place] [address]   Culture, blood (routine x 2)     Status: None (Preliminary result)   Collection Time: 10/10/20  4:06 PM   Specimen: BLOOD RIGHT WRIST  Result Value Ref Range Status   Specimen Description BLOOD RIGHT WRIST  Final   Special Requests   Final    BOTTLES DRAWN AEROBIC AND ANAEROBIC Blood Culture results may not be optimal due to an inadequate volume of blood received in culture bottles   Culture   Final    NO GROWTH < 24 HOURS Performed at [facility], [place] [address], [place], [place] [address]    Report Status PENDING  Incomplete  MRSA Next Gen by PCR, Nasal     Status: None   Collection Time: 10/11/20  4:52 AM   Specimen: Nasal Mucosa; Nasal Swab  Result Value Ref Range Status   MRSA by PCR Next Gen NOT DETECTED NOT DETECTED Final    Comment: (NOTE) The GeneXpert MRSA Assay (FDA approved for NASAL specimens only), is one component of a comprehensive MRSA colonization surveillance program. It is not intended to  diagnose MRSA infection nor to guide or monitor treatment for MRSA infections. Test performance is not FDA approved in patients less than 60 years old. Performed at [facility], [place] [address] [place], [place] [address]      Radiology Studies: DG Chest 1 View  Result Date: 10/10/2020 CLINICAL DATA:  Altered mental status, shortness of breath EXAM: CHEST  1 VIEW COMPARISON:  Chest radiograph 11/09/2015 FINDINGS: The heart is enlarged. The mediastinal contours are within normal limits. There is calcified atherosclerotic plaque of the aortic arch. There are coarsened interstitial markings throughout  both lungs, likely chronic. There is a small left pleural effusion with adjacent airspace disease. The right costophrenic angle is cut off. There is no overt pulmonary edema. There is no pneumothorax. There is no acute osseous abnormality. IMPRESSION: 1. Small left pleural effusion with adjacent airspace disease which may reflect atelectasis or pneumonia. 2. Mild cardiomegaly. Electronically Signed   By: Valetta Mole M.D.   On: 10/10/2020 16:01   CT Head Wo Contrast  Result Date: 10/10/2020 CLINICAL DATA:  Mental status change EXAM: CT HEAD WITHOUT CONTRAST TECHNIQUE: Contiguous axial images were obtained from the base of the skull through the vertex without intravenous contrast. COMPARISON:  CT 07/20/2016, MRI 09/22/2020 FINDINGS: Brain: Motion degradation. No acute territorial infarction, hemorrhage or intracranial mass. Moderate severe chronic small vessel ischemic changes of the white matter. Chronic lacunar infarct within the left white matter and basal ganglia. Chronic left cerebellar infarct. Stable ventricle size. Atrophy Vascular: No hyperdense vessels. Carotid vascular and vertebral calcification Skull: Normal. Negative for fracture or focal lesion. Sinuses/Orbits: No acute finding. Other: None IMPRESSION: 1. Motion degraded study. 2. No definite CT evidence for acute intracranial  abnormality. Atrophy and chronic small vessel ischemic changes of the white matter. Electronically Signed   By: Donavan Foil M.D.   On: 10/10/2020 19:42     Marzetta Board, MD, PhD [facility]  Between 7 am - 7 pm I am available, please contact me via Amion (for emergencies) or Securechat (non urgent messages)  Between 7 pm - 7 am I am not available, please contact night coverage MD/APP via Amion

## 2020-10-11 NOTE — NC FL2 (Signed)
[place] LEVEL OF CARE SCREENING TOOL     IDENTIFICATION  Patient Name: Crystal Osborne Birthdate: 05-Oct-1931 Sex: female Admission Date (Current Location): 10/10/2020  [place] and [place] Number:  Kathleen Argue [id number] [place] and Address:  [facility] [place], [place] [address], [place], [place] [address]      Provider Number: [id number]  Attending Physician Name and Address:  Caren Griffins, MD  Relative Name and Phone Number:  Ellea, Bisel  [phone]    Current Level of Care: Hospital Recommended Level of Care: [place] Prior Approval Number:    Date Approved/Denied:   PASRR Number: [id number]  Discharge Plan: SNF    Current Diagnoses: Patient Active Problem List   Diagnosis Date Noted   CAP (community acquired pneumonia) 0000000   Acute metabolic encephalopathy 0000000   Hypertrophic cardiomyopathy ([place]) 10/10/2020   Shingles 07/29/2017   Syncope 07/25/2016   Claudication ([place]) 12/03/2015   PCP NOTES >>>>> 01/10/2015   COPD (chronic obstructive pulmonary disease) ([place]) 01/25/2014   B12 deficiency 01/25/2014   Anxiety and depression 07/26/2012   Insomnia 08/12/2011   Annual physical exam 05/29/2010   Hyperglycemia 05/29/2010   Diarrhea, chronic , on-off 05/29/2010   TOBACCO ABUSE 11/15/2009   Disorder of bladder 09/25/2009   SHOULDER PAIN 04/10/2009   PERSONAL HX BREAST CANCER 12/12/2008   Carotid artery disease ([place]) 11/28/2008   PVD (peripheral vascular disease) ([place]) 04/18/2008   BLIND LOOP SYNDROME 11/09/2007   DIVERTICULOSIS, COLON 10/14/2007   COLONIC POLYPS 08/03/2006   Essential hypertension 08/03/2006   CVA 08/03/2006   OSTEOPOROSIS NOS 08/03/2006   THYROIDECTOMY, HX OF 08/03/2006   Hyperlipidemia 03/05/2006   Abdominal aortic aneurysm ([place]) 03/05/2006    Orientation RESPIRATION BLADDER Height & Weight     Self, Place  Normal External catheter Weight:   Height:     BEHAVIORAL  SYMPTOMS/MOOD NEUROLOGICAL BOWEL NUTRITION STATUS      Continent Diet (see discharge summary)  AMBULATORY STATUS COMMUNICATION OF NEEDS Skin   Limited Assist Verbally Other (Comment) (ecchymosis)                       Personal Care Assistance Level of Assistance  Bathing, Feeding, Dressing Bathing Assistance: Maximum assistance Feeding assistance: Limited assistance Dressing Assistance: Maximum assistance     Functional Limitations Info  Sight, Hearing, Speech Sight Info: Adequate Hearing Info: Adequate Speech Info: Adequate    SPECIAL CARE FACTORS FREQUENCY  PT (By licensed PT), OT (By licensed OT)     PT Frequency: 5x week OT Frequency: 5x week            Contractures Contractures Info: Not present    Additional Factors Info  Code Status, Allergies Code Status Info: DNR Allergies Info: Septra (Sulfamethoxazole-trimethoprim), Ciprofloxacin Hcl, Codeine           Current Medications (10/11/2020):  This is the current hospital active medication list Current Facility-Administered Medications  Medication Dose Route Frequency Provider Last Rate Last Admin   acetaminophen (TYLENOL) tablet 650 mg  650 mg Oral Q6H PRN Bonnielee Haff, MD       Or   acetaminophen (TYLENOL) suppository 650 mg  650 mg Rectal Q6H PRN Bonnielee Haff, MD       azithromycin (ZITHROMAX) 500 mg in sodium chloride 0.9 % 250 mL IVPB  500 mg Intravenous Q24H Bonnielee Haff, MD       cefTRIAXone (ROCEPHIN) 2 g in sodium chloride 0.9 % 100 mL IVPB  2 g Intravenous Q24H Bonnielee Haff, MD       enoxaparin (LOVENOX) injection 40 mg  40 mg Subcutaneous Q24H Bonnielee Haff, MD   40 mg at 10/10/20 1923   haloperidol lactate (HALDOL) injection 0.5 mg  0.5 mg Intravenous Q6H PRN Bonnielee Haff, MD   0.5 mg at 10/10/20 1912   hydrALAZINE (APRESOLINE) injection 10 mg  10 mg Intravenous Q6H PRN Bonnielee Haff, MD       ipratropium-albuterol (DUONEB) 0.5-2.5 (3) MG/3ML nebulizer solution 3 mL  3 mL  Nebulization Q4H PRN Bonnielee Haff, MD       MEDLINE mouth rinse  15 mL Mouth Rinse BID Bonnielee Haff, MD   15 mL at 10/11/20 1005   metoprolol succinate (TOPROL-XL) 24 hr tablet 12.5 mg  12.5 mg Oral Daily Bonnielee Haff, MD   12.5 mg at 10/11/20 1005   nicotine (NICODERM CQ - dosed in mg/24 hours) patch 14 mg  14 mg Transdermal Q24H Bonnielee Haff, MD   14 mg at 10/10/20 1923   ondansetron (ZOFRAN) tablet 4 mg  4 mg Oral Q6H PRN Bonnielee Haff, MD       Or   ondansetron [facility]) injection 4 mg  4 mg Intravenous Q6H PRN Bonnielee Haff, MD       thiamine tablet 100 mg  100 mg Oral Daily Bonnielee Haff, MD   100 mg at 10/11/20 1005     Discharge Medications: Please see discharge summary for a list of discharge medications.  Relevant Imaging Results:  Relevant Lab Results:   Additional Information SSN: [id number].  Pt is vaccinated for covid but not boosted.  Joanne Chars, LCSW

## 2020-10-11 NOTE — Progress Notes (Signed)
Pt has arrived to [place]. Identified appropriately. VS stable. Pt responsive to voice only, does not fallow commands at this time.  Cardiac monitor in place and CCMD notified. Pt on non re breather, SpO2 in high 90s.  Bed alarm in place, and all bell left within pt reach. No family at the bedside, unable to complete admission history due to AMS. Will continue to monitor pt and treat per MD orders.

## 2020-10-11 NOTE — Evaluation (Signed)
Physical Therapy Evaluation Patient Details Name: Crystal Osborne MRN: [id number] DOB: 1931-08-20 Today's Date: 10/11/2020   History of Present Illness  87 yo admitted 8/31 with AMS and hypoxia with head CT (-). PMhx: HTN, cardiomyopathy, COPD, HLD, DM  Clinical Impression  Pt pleasant with some confusion and notable weakness and fatigue. Pt with desaturation to 87% on RA with gait. Pt lives alone with only intermittent assist from family and neighbors. Pt would benefit from acute therapy to maximize mobility, safety, gait and function to decrease burden of care and fall risk. Encouraged OOB to chair daily with nursing.  HR 70 97% on 1L 162/64 (88)    Follow Up Recommendations SNF    Equipment Recommendations  3in1 (PT)    Recommendations for Other Services       Precautions / Restrictions Precautions Precautions: Fall      Mobility  Bed Mobility Overal bed mobility: Needs Assistance Bed Mobility: Supine to Sit     Supine to sit: Supervision;HOB elevated     General bed mobility comments: HOB 30 degrees with increased time, supervision for safety with cues to complete transfer    Transfers Overall transfer level: Needs assistance   Transfers: Sit to/from Stand Sit to Stand: Min assist         General transfer comment: assist to rise from bed with cues for hand placement, eyes open  Ambulation/Gait Ambulation/Gait assistance: Min assist Gait Distance (Feet): 10 Feet Assistive device: Rolling walker (2 wheeled) Gait Pattern/deviations: Step-through pattern;Decreased stride length;Trunk flexed   Gait velocity interpretation: <1.8 ft/sec, indicate of risk for recurrent falls General Gait Details: pt with sats 92>87% on RA with gait with close chair follow as pt fatigues quickly and sits prematurely  Stairs            Wheelchair Mobility    Modified Rankin (Stroke Patients Only)       Balance Overall balance assessment: Needs  assistance Sitting-balance support: No upper extremity supported;Feet supported Sitting balance-Leahy Scale: Fair Sitting balance - Comments: EOb with supervision   Standing balance support: Bilateral upper extremity supported Standing balance-Leahy Scale: Poor Standing balance comment: bil UE on RW with physical assist                             Pertinent Vitals/Pain      Home Living Family/patient expects to be discharged to:: Private residence Living Arrangements: Alone Available Help at Discharge: Family;Available PRN/intermittently Type of Home: House Home Access: Ramped entrance     Home Layout: Two level;Able to live on main level with bedroom/bathroom Home Equipment: Gilford Rile - 2 wheels;Cane - single point Additional Comments: son assists with lawn and grocery shopping    Prior Function Level of Independence: Independent               Hand Dominance        Extremity/Trunk Assessment                Communication   Communication: No difficulties  Cognition Arousal/Alertness: Awake/alert Behavior During Therapy: WFL for tasks assessed/performed Overall Cognitive Status: Impaired/Different from baseline Area of Impairment: Orientation                 Orientation Level: Disoriented to;Time;Situation;Place                    General Comments      Exercises General Exercises - Lower Extremity Ankle Circles/Pumps: AROM;10  reps;Both;Seated Long Arc Quad: AROM;Both;Seated;20 reps Hip Flexion/Marching: AROM;Both;Seated;15 reps   Assessment/Plan    PT Assessment Patient needs continued PT services  PT Problem List Decreased strength;Decreased mobility;Decreased safety awareness;Decreased activity tolerance;Decreased cognition;Cardiopulmonary status limiting activity;Decreased balance;Decreased knowledge of use of DME       PT Treatment Interventions Gait training;Balance training;Functional mobility training;Cognitive  remediation;DME instruction;Therapeutic exercise;Therapeutic activities;Patient/family education    PT Goals (Current goals can be found in the Care Plan section)  Acute Rehab PT Goals Patient Stated Goal: return home and to garden PT Goal Formulation: With patient Time For Goal Achievement: 10/25/20 Potential to Achieve Goals: Fair    Frequency Min 3X/week   Barriers to discharge Decreased caregiver support      Co-evaluation               AM-PAC PT "6 Clicks" Mobility  Outcome Measure Help needed turning from your back to your side while in a flat bed without using bedrails?: A Little Help needed moving from lying on your back to sitting on the side of a flat bed without using bedrails?: A Little Help needed moving to and from a bed to a chair (including a wheelchair)?: A Little Help needed standing up from a chair using your arms (e.g., wheelchair or bedside chair)?: A Little Help needed to walk in hospital room?: A Little Help needed climbing 3-5 steps with a railing? : A Lot 6 Click Score: 17    End of Session Equipment Utilized During Treatment: Gait belt Activity Tolerance: Patient tolerated treatment well Patient left: in chair;with call bell/phone within reach;with chair alarm set Nurse Communication: Mobility status PT Visit Diagnosis: Other abnormalities of gait and mobility (R26.89);Difficulty in walking, not elsewhere classified (R26.2);Muscle weakness (generalized) (M62.81)    Time: [id number] PT Time Calculation (min) (ACUTE ONLY): 29 min   Charges:   PT Evaluation $PT Eval Moderate Complexity: 1 Mod PT Treatments $Gait Training: 8-22 mins        Ermine Spofford P, PT Acute Rehabilitation Services Pager: [phone] Office: [place] 10/11/2020, 10:25 AM

## 2020-10-12 LAB — CBC
HCT: 40 % (ref 36.0–46.0)
Hemoglobin: 13.1 g/dL (ref 12.0–15.0)
MCH: 30.9 pg (ref 26.0–34.0)
MCHC: 32.8 g/dL (ref 30.0–36.0)
MCV: 94.3 fL (ref 80.0–100.0)
Platelets: 197 10*3/uL (ref 150–400)
RBC: 4.24 MIL/uL (ref 3.87–5.11)
RDW: 14.7 % (ref 11.5–15.5)
WBC: 15.2 10*3/uL — ABNORMAL HIGH (ref 4.0–10.5)
nRBC: 0 % (ref 0.0–0.2)

## 2020-10-12 LAB — COMPREHENSIVE METABOLIC PANEL
ALT: 110 U/L — ABNORMAL HIGH (ref 0–44)
AST: 58 U/L — ABNORMAL HIGH (ref 15–41)
Albumin: 2.8 g/dL — ABNORMAL LOW (ref 3.5–5.0)
Alkaline Phosphatase: 97 U/L (ref 38–126)
Anion gap: 9 (ref 5–15)
BUN: 16 mg/dL (ref 8–23)
CO2: 23 mmol/L (ref 22–32)
Calcium: 8.3 mg/dL — ABNORMAL LOW (ref 8.9–10.3)
Chloride: 102 mmol/L (ref 98–111)
Creatinine, Ser: 0.92 mg/dL (ref 0.44–1.00)
GFR, Estimated: >60 mL/min (ref >60)
Glucose, Bld: 129 mg/dL — ABNORMAL HIGH (ref 70–99)
Potassium: 4.1 mmol/L (ref 3.5–5.1)
Sodium: 134 mmol/L — ABNORMAL LOW (ref 135–145)
Total Bilirubin: 0.7 mg/dL (ref 0.3–1.2)
Total Protein: 5.5 g/dL — ABNORMAL LOW (ref 6.5–8.1)

## 2020-10-12 LAB — PROCALCITONIN: Procalcitonin: 0.57 ng/mL

## 2020-10-12 NOTE — Care Management Important Message (Signed)
Important Message  Patient Details  Name: Crystal Osborne MRN: [id number] Date of Birth: 05-Jun-1931   Medicare Important Message Given:  Yes     Orbie Pyo 10/12/2020, 2:58 PM

## 2020-10-12 NOTE — TOC Progression Note (Signed)
Transition of Care [facility]) - Progression Note    Patient Details  Name: Crystal Osborne MRN: [id number] Date of Birth: 1931-02-14  Transition of Care [facility]) CM/SW Contact  Joanne Chars, LCSW Phone Number: 10/12/2020, 3:32 PM  Clinical Narrative:   CSW spoke to son by phone, informed him [facility] has already declined.  He will call them.  He is also asking that we check with following SNFs [place]: per [place], no beds, no scheduled DC coming up [place]: per [place], will be Tues or Wed before they have a bed [place]: [facility] reviewing    Expected Discharge Plan: [place] Barriers to Discharge: Continued Medical Work up, SNF Pending bed offer  Expected Discharge Plan and Services Expected Discharge Plan: [place] Choice: [place] arrangements for the past 2 months: Single Family Home                                       Social Determinants of Health (SDOH) Interventions    Readmission Risk Interventions No flowsheet data found.

## 2020-10-12 NOTE — Plan of Care (Signed)
Pt is pleasantly confused sometimes thinks she is at home but easily re-oriented. Denies pain. Weaned to RA. BP elevated managed with 1x hydralazine. Fair appetite no BM for shift. Urine via external cath. 1 PIV intact and patent. Receiving IV abx. No major skin issues only fragile skin. Bath given. Call bed in reach. Fall precautions in place.    Problem: Education: Goal: Knowledge of General Education information will improve Description: Including pain rating scale, medication(s)/side effects and non-pharmacologic comfort measures Outcome: Progressing   Problem: Health Behavior/Discharge Planning: Goal: Ability to manage health-related needs will improve Outcome: Progressing   Problem: Clinical Measurements: Goal: Ability to maintain clinical measurements within normal limits will improve Outcome: Progressing Goal: Will remain free from infection Outcome: Progressing Goal: Diagnostic test results will improve Outcome: Progressing Goal: Respiratory complications will improve Outcome: Progressing Goal: Cardiovascular complication will be avoided Outcome: Progressing   Problem: Activity: Goal: Risk for activity intolerance will decrease Outcome: Progressing   Problem: Nutrition: Goal: Adequate nutrition will be maintained Outcome: Progressing   Problem: Coping: Goal: Level of anxiety will decrease Outcome: Progressing   Problem: Elimination: Goal: Will not experience complications related to bowel motility Outcome: Progressing Goal: Will not experience complications related to urinary retention Outcome: Progressing   Problem: Pain Managment: Goal: General experience of comfort will improve Outcome: Progressing   Problem: Safety: Goal: Ability to remain free from injury will improve Outcome: Progressing   Problem: Skin Integrity: Goal: Risk for impaired skin integrity will decrease Outcome: Progressing   Problem: Activity: Goal: Ability to tolerate increased  activity will improve Outcome: Progressing   Problem: Clinical Measurements: Goal: Ability to maintain a body temperature in the normal range will improve Outcome: Progressing   Problem: Respiratory: Goal: Ability to maintain adequate ventilation will improve Outcome: Progressing Goal: Ability to maintain a clear airway will improve Outcome: Progressing   Problem: Clinical Measurements: Goal: Ability to maintain clinical measurements within normal limits will improve Outcome: Progressing Goal: Will remain free from infection Outcome: Progressing Goal: Diagnostic test results will improve Outcome: Progressing Goal: Respiratory complications will improve Outcome: Progressing Goal: Cardiovascular complication will be avoided Outcome: Progressing   Problem: Nutrition: Goal: Adequate nutrition will be maintained Outcome: Progressing   Problem: Coping: Goal: Level of anxiety will decrease Outcome: Progressing

## 2020-10-12 NOTE — Progress Notes (Signed)
PROGRESS NOTE  Crystal Osborne [id number] DOB: 05/25/1931 DOA: 10/10/2020 PCP: Colon Branch, MD   LOS: 2 days   Brief Narrative / Interim history: 87 year old female with HTN, HOCM, tobacco use, COPD, hyperlipidemia comes into the hospital brought by her son that she was increasingly confused over the last 2 days.  Normally has no dementia and is alert and oriented x4 with normal cognitive function at baseline.  She was delirious in the ED, agitated.  Patient was experiencing some shoulder discomfort about 2 weeks ago for which she was prescribed tramadol.  She was prescribed 25 tablets and about 11 tablets left in the bottle.  Recently she was changed over from lisinopril to metoprolol after she was seen at the cardiology clinic.  She had an echocardiogram recently which showed hypertrophic cardiomyopathy.  No clear history of fever or chills.  No nausea or vomiting.  No recent falls or injuries.  She was diagnosed with left-sided lobar pneumonia and was admitted to the hospital.  COVID-19 was negative.  Subjective / 24h Interval events: She denies any shortness of breath this morning, denies any chest pain.  Assessment & Plan: Principal Problem Acute metabolic encephalopathy, acute delirium-possibly multifactorial due to medication with tramadol, infectious process. -Her metabolic encephalopathy and delirium seems to be clearing up, she is close to baseline.  Active Problems Acute hypoxic respiratory failure due to community-acquired pneumonia -She was on nonrebreather at 1 point but has been weaned back down to 2 L this morning.  Wean off to room air as tolerated -Continue broad-spectrum antibiotics with ceftriaxone and azithromycin, monitor, cultures without growth at day #2  Recently diagnosed hypertrophic cardiomyopathy-continue metoprolol  COPD, ongoing tobacco use-smokes half a pack a day, son tells me that she will never be able to quit  Hyperlipidemia-continue statin due to LFT  elevation  Elevated LFT-possibly due to acute illness, monitor  Scheduled Meds:  enoxaparin (LOVENOX) injection  40 mg Subcutaneous Q24H   mouth rinse  15 mL Mouth Rinse BID   metoprolol succinate  12.5 mg Oral Daily   nicotine  14 mg Transdermal Q24H   thiamine  100 mg Oral Daily   Continuous Infusions:  azithromycin     cefTRIAXone (ROCEPHIN)  IV 2 g (10/11/20 1911)   PRN Meds:.acetaminophen **OR** acetaminophen, haloperidol lactate, hydrALAZINE, ipratropium-albuterol, ondansetron **OR** ondansetron (ZOFRAN) IV  Diet Orders (From admission, onward)     Start     Ordered   10/10/20 1803  DIET DYS 3 Room service appropriate? Yes; Fluid consistency: Thin  Diet effective now       Question Answer Comment  Room service appropriate? Yes   Fluid consistency: Thin      10/10/20 1806            DVT prophylaxis: enoxaparin (LOVENOX) injection 40 mg Start: 10/10/20 1830     Code Status: DNR  Family Communication: Son [name] at bedside  Status is: Inpatient  Remains inpatient appropriate because:Inpatient level of care appropriate due to severity of illness  Dispo:  Patient From: Home  Planned Disposition: SNF  Medically stable for discharge: No    Level of care: Progressive  Consultants:  none  Procedures:  none  Microbiology  none  Antimicrobials: Ceftriaxone  / Azithromycin 8/31 >>    Objective: Vitals:   10/12/20 0845 10/12/20 1142 10/12/20 1200 10/12/20 1219  BP: (!) 171/83 (!) 175/69 (!) 180/89 (!) 154/63  Pulse:  62 67 70  Resp:  (!) '25 18 18  '$ Temp:  97.8 F (36.6 C)    TempSrc:  Oral    SpO2:  99% 99% 99%    Intake/Output Summary (Last 24 hours) at 10/12/2020 1328 Last data filed at 10/12/2020 1200 Gross per 24 hour  Intake --  Output 200 ml  Net -200 ml    There were no vitals filed for this visit.  Examination:  Constitutional: nad Eyes: no icterus ENMT: mmm Neck: normal, supple Respiratory: Distant breath sounds throughout,  overall clear, no wheezing, no crackles heart Cardiovascular: Regular rate and rhythm, no peripheral edema Abdomen: Soft, nontender, nondistended, bowel sounds positive Musculoskeletal: no clubbing / cyanosis.  Skin: No rash appreciated Neurologic: Non focal  Data Reviewed: I have independently reviewed following labs and imaging studies   CBC: Recent Labs  Lab 10/10/20 1524 10/11/20 0042 10/12/20 0358  WBC 12.9* 13.8* 15.2*  NEUTROABS 10.1*  --   --   HGB 14.1 11.7* 13.1  HCT 43.2 35.6* 40.0  MCV 93.9 94.2 94.3  PLT 238 175 [id number]    Basic Metabolic Panel: Recent Labs  Lab 10/10/20 1524 10/11/20 0042 10/12/20 0358  NA 136 135 134*  K 5.1 4.4 4.1  CL 97* 104 102  CO2 '30 23 23  '$ GLUCOSE 155* 114* 129*  BUN '15 18 16  '$ CREATININE 0.97 0.94 0.92  CALCIUM 8.8* 7.6* 8.3*    Liver Function Tests: Recent Labs  Lab 10/10/20 1524 10/11/20 0042 10/12/20 0358  AST 28 112* 58*  ALT 30 120* 110*  ALKPHOS 76 112 97  BILITOT 1.4* 0.9 0.7  PROT 6.1* 4.6* 5.5*  ALBUMIN 3.3* 2.4* 2.8*    Coagulation Profile: No results for input(s): INR, PROTIME in the last 168 hours. HbA1C: No results for input(s): HGBA1C in the last 72 hours. CBG: Recent Labs  Lab 10/11/20 0010  GLUCAP 106*     Recent Results (from the past 240 hour(s))  Culture, blood (routine x 2)     Status: None (Preliminary result)   Collection Time: 10/10/20  3:00 PM   Specimen: BLOOD  Result Value Ref Range Status   Specimen Description BLOOD RIGHT ANTECUBITAL  Final   Special Requests   Final    BOTTLES DRAWN AEROBIC AND ANAEROBIC Blood Culture adequate volume   Culture   Final    NO GROWTH 2 DAYS Performed at [facility], [address], [place], [place] [address]    Report Status PENDING  Incomplete  Resp Panel by RT-PCR (Flu A&B, Covid) Nasopharyngeal Swab     Status: None   Collection Time: 10/10/20  3:29 PM   Specimen: Nasopharyngeal Swab; Nasopharyngeal(NP) swabs in vial transport medium   Result Value Ref Range Status   SARS Coronavirus 2 by RT PCR NEGATIVE NEGATIVE Final    Comment: (NOTE) SARS-CoV-2 target nucleic acids are NOT DETECTED.  The SARS-CoV-2 RNA is generally detectable in upper respiratory specimens during the acute phase of infection. The lowest concentration of SARS-CoV-2 viral copies this assay can detect is 138 copies/mL. A negative result does not preclude SARS-Cov-2 infection and should not be used as the sole basis for treatment or other patient management decisions. A negative result may occur with  improper specimen collection/handling, submission of specimen other than nasopharyngeal swab, presence of viral mutation(s) within the areas targeted by this assay, and inadequate number of viral copies(<138 copies/mL). A negative result must be combined with clinical observations, patient history, and epidemiological information. The expected result is Negative.  Fact Sheet for Patients:  EntrepreneurPulse.com.au  Fact Sheet for Healthcare Providers:  IncredibleEmployment.be  This test is no t yet approved or cleared by the [place] FDA and  has been authorized for detection and/or diagnosis of SARS-CoV-2 by FDA under an Emergency Use Authorization (EUA). This EUA will remain  in effect (meaning this test can be used) for the duration of the COVID-19 declaration under Section 564(b)(1) of the Act, 21 U.S.C.section 360bbb-3(b)(1), unless the authorization is terminated  or revoked sooner.       Influenza A by PCR NEGATIVE NEGATIVE Final   Influenza B by PCR NEGATIVE NEGATIVE Final    Comment: (NOTE) The Xpert Xpress SARS-CoV-2/FLU/RSV plus assay is intended as an aid in the diagnosis of influenza from Nasopharyngeal swab specimens and should not be used as a sole basis for treatment. Nasal washings and aspirates are unacceptable for Xpert Xpress SARS-CoV-2/FLU/RSV testing.  Fact Sheet for  Patients: EntrepreneurPulse.com.au  Fact Sheet for Healthcare Providers: IncredibleEmployment.be  This test is not yet approved or cleared by the [place] FDA and has been authorized for detection and/or diagnosis of SARS-CoV-2 by FDA under an Emergency Use Authorization (EUA). This EUA will remain in effect (meaning this test can be used) for the duration of the COVID-19 declaration under Section 564(b)(1) of the Act, 21 U.S.C. section 360bbb-3(b)(1), unless the authorization is terminated or revoked.  Performed at [facility], [place] [address] [place], [place] [address]   Culture, blood (routine x 2)     Status: None (Preliminary result)   Collection Time: 10/10/20  4:06 PM   Specimen: BLOOD RIGHT WRIST  Result Value Ref Range Status   Specimen Description BLOOD RIGHT WRIST  Final   Special Requests   Final    BOTTLES DRAWN AEROBIC AND ANAEROBIC Blood Culture results may not be optimal due to an inadequate volume of blood received in culture bottles   Culture   Final    NO GROWTH 2 DAYS Performed at [facility], [place] [address] [place], [place] [address]    Report Status PENDING  Incomplete  MRSA Next Gen by PCR, Nasal     Status: None   Collection Time: 10/11/20  4:52 AM   Specimen: Nasal Mucosa; Nasal Swab  Result Value Ref Range Status   MRSA by PCR Next Gen NOT DETECTED NOT DETECTED Final    Comment: (NOTE) The GeneXpert MRSA Assay (FDA approved for NASAL specimens only), is one component of a comprehensive MRSA colonization surveillance program. It is not intended to diagnose MRSA infection nor to guide or monitor treatment for MRSA infections. Test performance is not FDA approved in patients less than 85 years old. Performed at [facility], [place] [address], [place], [place] [address]       Radiology Studies: No results found.   Marzetta Board, MD, PhD [facility]  Between 7 am - 7 pm I am  available, please contact me via Amion (for emergencies) or Securechat (non urgent messages)  Between 7 pm - 7 am I am not available, please contact night coverage MD/APP via Amion

## 2020-10-12 NOTE — Plan of Care (Signed)
  Problem: Education: Goal: Knowledge of General Education information will improve Description: Including pain rating scale, medication(s)/side effects and non-pharmacologic comfort measures Outcome: Progressing   Problem: Health Behavior/Discharge Planning: Goal: Ability to manage health-related needs will improve Outcome: Progressing   Problem: Clinical Measurements: Goal: Ability to maintain clinical measurements within normal limits will improve Outcome: Progressing Goal: Will remain free from infection Outcome: Progressing Goal: Diagnostic test results will improve Outcome: Progressing Goal: Respiratory complications will improve Outcome: Progressing Goal: Cardiovascular complication will be avoided Outcome: Progressing   Problem: Activity: Goal: Risk for activity intolerance will decrease Outcome: Progressing   Problem: Nutrition: Goal: Adequate nutrition will be maintained Outcome: Progressing   Problem: Coping: Goal: Level of anxiety will decrease Outcome: Progressing   Problem: Elimination: Goal: Will not experience complications related to bowel motility Outcome: Progressing Goal: Will not experience complications related to urinary retention Outcome: Progressing   Problem: Pain Managment: Goal: General experience of comfort will improve Outcome: Progressing   Problem: Safety: Goal: Ability to remain free from injury will improve Outcome: Progressing   Problem: Skin Integrity: Goal: Risk for impaired skin integrity will decrease Outcome: Progressing   Problem: Activity: Goal: Ability to tolerate increased activity will improve Outcome: Progressing   Problem: Clinical Measurements: Goal: Ability to maintain a body temperature in the normal range will improve Outcome: Progressing   Problem: Respiratory: Goal: Ability to maintain adequate ventilation will improve Outcome: Progressing Goal: Ability to maintain a clear airway will improve Outcome:  Progressing   Problem: Clinical Measurements: Goal: Ability to maintain clinical measurements within normal limits will improve Outcome: Progressing Goal: Will remain free from infection Outcome: Progressing Goal: Diagnostic test results will improve Outcome: Progressing Goal: Respiratory complications will improve Outcome: Progressing Goal: Cardiovascular complication will be avoided Outcome: Progressing   Problem: Nutrition: Goal: Adequate nutrition will be maintained Outcome: Progressing   Problem: Coping: Goal: Level of anxiety will decrease Outcome: Progressing

## 2020-10-13 ENCOUNTER — Inpatient Hospital Stay (HOSPITAL_COMMUNITY): Payer: Medicare Other

## 2020-10-13 DIAGNOSIS — R4182 Altered mental status, unspecified: Secondary | ICD-10-CM

## 2020-10-13 LAB — BASIC METABOLIC PANEL
Anion gap: 11 (ref 5–15)
BUN: 16 mg/dL (ref 8–23)
CO2: 22 mmol/L (ref 22–32)
Calcium: 8.6 mg/dL — ABNORMAL LOW (ref 8.9–10.3)
Chloride: 105 mmol/L (ref 98–111)
Creatinine, Ser: 0.84 mg/dL (ref 0.44–1.00)
GFR, Estimated: >60 mL/min (ref >60)
Glucose, Bld: 91 mg/dL (ref 70–99)
Potassium: 4.2 mmol/L (ref 3.5–5.1)
Sodium: 138 mmol/L (ref 135–145)

## 2020-10-13 LAB — CBC
HCT: 39 % (ref 36.0–46.0)
Hemoglobin: 13 g/dL (ref 12.0–15.0)
MCH: 31 pg (ref 26.0–34.0)
MCHC: 33.3 g/dL (ref 30.0–36.0)
MCV: 93.1 fL (ref 80.0–100.0)
Platelets: 206 10*3/uL (ref 150–400)
RBC: 4.19 MIL/uL (ref 3.87–5.11)
RDW: 14.9 % (ref 11.5–15.5)
WBC: 15 10*3/uL — ABNORMAL HIGH (ref 4.0–10.5)
nRBC: 0 % (ref 0.0–0.2)

## 2020-10-13 NOTE — Progress Notes (Signed)
PROGRESS NOTE  Crystal Osborne [id number] DOB: Dec 07, 1931 DOA: 10/10/2020 PCP: Colon Branch, MD   LOS: 3 days   Brief Narrative / Interim history: 87 year old female with HTN, HOCM, tobacco use, COPD, hyperlipidemia comes into the hospital brought by her son that she was increasingly confused over the last 2 days.  Normally has no dementia and is alert and oriented x4 with normal cognitive function at baseline.  She was delirious in the ED, agitated.  Patient was experiencing some shoulder discomfort about 2 weeks ago for which she was prescribed tramadol.  She was prescribed 25 tablets and about 11 tablets left in the bottle.  Recently she was changed over from lisinopril to metoprolol after she was seen at the cardiology clinic.  She had an echocardiogram recently which showed hypertrophic cardiomyopathy.  No clear history of fever or chills.  No nausea or vomiting.  No recent falls or injuries.  She was diagnosed with left-sided lobar pneumonia and was admitted to the hospital.  COVID-19 was negative.  Subjective / 24h Interval events: She is doing well this morning, denies any shortness of breath, denies any chest pain.  Slightly confused overnight but alert and oriented x4 this morning.  Assessment & Plan: Principal Problem Acute metabolic encephalopathy, acute delirium-possibly multifactorial due to medication with tramadol, infectious process. -For most part she is alert and oriented x4, with intermittent confusion likely due to hospital stay.  Active Problems Acute hypoxic respiratory failure due to community-acquired pneumonia -She was on nonrebreather at 1 point but has been weaned back down to 2 L this on hospital day 2 and currently she is on room air -Continue broad-spectrum antibiotics with ceftriaxone and azithromycin, monitor, cultures without growth at day #3 stop WBC still up at 15 but slightly better today than yesterday  Right shoulder pain-possibly osteoarthritic in  nature/small trauma history reports that occasionally she bumped her right shoulder into the wall when trying to ambulate.  She has good range of motion, no ecchymosis or fracture suspected but will obtain a dedicated shoulder x-ray.  For now conservative management/PT  Recently diagnosed hypertrophic cardiomyopathy-continue metoprolol  COPD, ongoing tobacco use-smokes half a pack a day, hopefully she will quit  Hyperlipidemia-continue statin due to LFT elevation  Elevated LFT-possibly due to acute illness, monitor, improving  Scheduled Meds:  enoxaparin (LOVENOX) injection  40 mg Subcutaneous Q24H   mouth rinse  15 mL Mouth Rinse BID   metoprolol succinate  12.5 mg Oral Daily   nicotine  14 mg Transdermal Q24H   thiamine  100 mg Oral Daily   Continuous Infusions:  azithromycin 500 mg (10/12/20 1745)   cefTRIAXone (ROCEPHIN)  IV 2 g (10/12/20 1726)   PRN Meds:.acetaminophen **OR** acetaminophen, haloperidol lactate, hydrALAZINE, ipratropium-albuterol, ondansetron **OR** ondansetron (ZOFRAN) IV  Diet Orders (From admission, onward)     Start     Ordered   10/10/20 1803  DIET DYS 3 Room service appropriate? Yes; Fluid consistency: Thin  Diet effective now       Question Answer Comment  Room service appropriate? Yes   Fluid consistency: Thin      10/10/20 1806            DVT prophylaxis: enoxaparin (LOVENOX) injection 40 mg Start: 10/10/20 1830     Code Status: DNR  Family Communication: Son [name] at bedside  Status is: Inpatient  Remains inpatient appropriate because:Inpatient level of care appropriate due to severity of illness  Dispo:  Patient From: Home  Planned Disposition: SNF  Medically stable for discharge: No    Level of care: Progressive  Consultants:  none  Procedures:  none  Microbiology  none  Antimicrobials: Ceftriaxone  / Azithromycin 8/31 >>    Objective: Vitals:   10/13/20 0856 10/13/20 0900 10/13/20 1000 10/13/20 1100  BP: (!)  187/61 (!) 185/74 (!) 164/79 (!) 157/69  Pulse: 90 89 86 68  Resp: (!) 26 (!) 25 (!) 26 19  Temp: 97.9 F (36.6 C)     TempSrc: Oral     SpO2: 93% 94% 93% 94%    Intake/Output Summary (Last 24 hours) at 10/13/2020 1137 Last data filed at 10/13/2020 0856 Gross per 24 hour  Intake 590 ml  Output 500 ml  Net 90 ml    There were no vitals filed for this visit.  Examination:  Constitutional: She is in no distress Eyes: Anicteric ENMT: mmm Neck: normal, supple Respiratory: Distant breath sounds throughout but overall clear without wheezing or crackles heard Cardiovascular: Heart regular, no peripheral edema Abdomen: Nondistended, bowel sounds positive Musculoskeletal: no clubbing / cyanosis.  Good range of motion right shoulder, mild tenderness to palpation on the posterior aspect Skin: No rashes seen Neurologic: No focal deficits  Data Reviewed: I have independently reviewed following labs and imaging studies   CBC: Recent Labs  Lab 10/10/20 1524 10/11/20 0042 10/12/20 0358 10/13/20 0209  WBC 12.9* 13.8* 15.2* 15.0*  NEUTROABS 10.1*  --   --   --   HGB 14.1 11.7* 13.1 13.0  HCT 43.2 35.6* 40.0 39.0  MCV 93.9 94.2 94.3 93.1  PLT 238 175 197 [id number]    Basic Metabolic Panel: Recent Labs  Lab 10/10/20 1524 10/11/20 0042 10/12/20 0358 10/13/20 0209  NA 136 135 134* 138  K 5.1 4.4 4.1 4.2  CL 97* 104 102 105  CO2 '30 23 23 22  '$ GLUCOSE 155* 114* 129* 91  BUN '15 18 16 16  '$ CREATININE 0.97 0.94 0.92 0.84  CALCIUM 8.8* 7.6* 8.3* 8.6*    Liver Function Tests: Recent Labs  Lab 10/10/20 1524 10/11/20 0042 10/12/20 0358  AST 28 112* 58*  ALT 30 120* 110*  ALKPHOS 76 112 97  BILITOT 1.4* 0.9 0.7  PROT 6.1* 4.6* 5.5*  ALBUMIN 3.3* 2.4* 2.8*    Coagulation Profile: No results for input(s): INR, PROTIME in the last 168 hours. HbA1C: No results for input(s): HGBA1C in the last 72 hours. CBG: Recent Labs  Lab 10/11/20 0010  GLUCAP 106*     Recent Results  (from the past 240 hour(s))  Culture, blood (routine x 2)     Status: None (Preliminary result)   Collection Time: 10/10/20  3:00 PM   Specimen: BLOOD  Result Value Ref Range Status   Specimen Description BLOOD RIGHT ANTECUBITAL  Final   Special Requests   Final    BOTTLES DRAWN AEROBIC AND ANAEROBIC Blood Culture adequate volume   Culture   Final    NO GROWTH 3 DAYS Performed at [facility], [address], [place], [place] [address]    Report Status PENDING  Incomplete  Resp Panel by RT-PCR (Flu A&B, Covid) Nasopharyngeal Swab     Status: None   Collection Time: 10/10/20  3:29 PM   Specimen: Nasopharyngeal Swab; Nasopharyngeal(NP) swabs in vial transport medium  Result Value Ref Range Status   SARS Coronavirus 2 by RT PCR NEGATIVE NEGATIVE Final    Comment: (NOTE) SARS-CoV-2 target nucleic acids are NOT DETECTED.  The SARS-CoV-2 RNA is generally  detectable in upper respiratory specimens during the acute phase of infection. The lowest concentration of SARS-CoV-2 viral copies this assay can detect is 138 copies/mL. A negative result does not preclude SARS-Cov-2 infection and should not be used as the sole basis for treatment or other patient management decisions. A negative result may occur with  improper specimen collection/handling, submission of specimen other than nasopharyngeal swab, presence of viral mutation(s) within the areas targeted by this assay, and inadequate number of viral copies(<138 copies/mL). A negative result must be combined with clinical observations, patient history, and epidemiological information. The expected result is Negative.  Fact Sheet for Patients:  EntrepreneurPulse.com.au  Fact Sheet for Healthcare Providers:  IncredibleEmployment.be  This test is no t yet approved or cleared by the [place] FDA and  has been authorized for detection and/or diagnosis of SARS-CoV-2 by FDA under an Emergency Use  Authorization (EUA). This EUA will remain  in effect (meaning this test can be used) for the duration of the COVID-19 declaration under Section 564(b)(1) of the Act, 21 U.S.C.section 360bbb-3(b)(1), unless the authorization is terminated  or revoked sooner.       Influenza A by PCR NEGATIVE NEGATIVE Final   Influenza B by PCR NEGATIVE NEGATIVE Final    Comment: (NOTE) The Xpert Xpress SARS-CoV-2/FLU/RSV plus assay is intended as an aid in the diagnosis of influenza from Nasopharyngeal swab specimens and should not be used as a sole basis for treatment. Nasal washings and aspirates are unacceptable for Xpert Xpress SARS-CoV-2/FLU/RSV testing.  Fact Sheet for Patients: EntrepreneurPulse.com.au  Fact Sheet for Healthcare Providers: IncredibleEmployment.be  This test is not yet approved or cleared by the [place] FDA and has been authorized for detection and/or diagnosis of SARS-CoV-2 by FDA under an Emergency Use Authorization (EUA). This EUA will remain in effect (meaning this test can be used) for the duration of the COVID-19 declaration under Section 564(b)(1) of the Act, 21 U.S.C. section 360bbb-3(b)(1), unless the authorization is terminated or revoked.  Performed at [facility], [place] [address], [place], [place] [address]   Culture, blood (routine x 2)     Status: None (Preliminary result)   Collection Time: 10/10/20  4:06 PM   Specimen: BLOOD RIGHT WRIST  Result Value Ref Range Status   Specimen Description BLOOD RIGHT WRIST  Final   Special Requests   Final    BOTTLES DRAWN AEROBIC AND ANAEROBIC Blood Culture results may not be optimal due to an inadequate volume of blood received in culture bottles   Culture   Final    NO GROWTH 3 DAYS Performed at [facility], [place] [address], [place], [place] [address]    Report Status PENDING  Incomplete  MRSA Next Gen by PCR, Nasal     Status: None   Collection Time: 10/11/20   4:52 AM   Specimen: Nasal Mucosa; Nasal Swab  Result Value Ref Range Status   MRSA by PCR Next Gen NOT DETECTED NOT DETECTED Final    Comment: (NOTE) The GeneXpert MRSA Assay (FDA approved for NASAL specimens only), is one component of a comprehensive MRSA colonization surveillance program. It is not intended to diagnose MRSA infection nor to guide or monitor treatment for MRSA infections. Test performance is not FDA approved in patients less than 57 years old. Performed at [facility], [place] [address] [place], [place] [address]       Radiology Studies: No results found.   Marzetta Board, MD, PhD [facility]  Between 7 am - 7 pm I am available, please contact me via Amion (for emergencies) or Securechat (non urgent messages)  Between 7 pm - 7 am I am not available, please contact night coverage MD/APP via Amion

## 2020-10-13 NOTE — TOC Progression Note (Signed)
Transition of Care [facility]) - Progression Note    Patient Details  Name: Crystal Osborne MRN: [id number] Date of Birth: [birth year]  Transition of Care [facility]) CM/SW [place], [place] Phone Number: [phone] 10/13/2020, 4:59 PM  Clinical Narrative:     CSW statted pt's auth. Reference # [id number].  TOC team will continue to assist with discharge planning needs.   Expected Discharge Plan: [place] Barriers to Discharge: Continued Medical Work up, SNF Pending bed offer  Expected Discharge Plan and Services Expected Discharge Plan: [place] Choice: [place] arrangements for the past 2 months: Single Family Home                                       Social Determinants of Health (SDOH) Interventions    Readmission Risk Interventions No flowsheet data found.

## 2020-10-13 NOTE — TOC Progression Note (Signed)
Transition of Care [facility]) - Progression Note    Patient Details  Name: Crystal Osborne MRN: [id number] Date of Birth: [birth year]  Transition of Care [facility]) CM/SW [place], [place] Phone Number: 10/13/2020, 3:17 PM  Clinical Narrative:     SW spoke with pt's son [name] [phone]) via phone, SW discussed current BO's with son. [name] prefers [place] or [place], but requested SW reach out to pt as well as he wants her to be included as much as possible and she is currently lucid. [name] is able to provide transport at d/c.   Update 200pm SW met with pt at bedside. SW explained SNF process (authorization, expected length of stay). SW presented pt with current offers. Pt agreeable to [place]. Pt has received 2 doses of COVID vaccine, pt reports she had a reaction after 2nd and did not get boosters.  Update 325pm SW updated [name] on pt decision. [name] requests himself and pt continue to be updated regarding pt readiness for d/c.   Insurance auth started  SW left VM with Perrin Smack Helene Kelp [phone])   Expected Discharge Plan: [place] Barriers to Discharge: Continued Medical Work up, SNF Pending bed offer  Expected Discharge Plan and Services Expected Discharge Plan: [place] Choice: [place] arrangements for the past 2 months: Single Family Home                 Social Determinants of Health (SDOH) Interventions    Readmission Risk Interventions No flowsheet data found.

## 2020-10-14 LAB — CBC
HCT: 37.5 % (ref 36.0–46.0)
Hemoglobin: 12.3 g/dL (ref 12.0–15.0)
MCH: 30.4 pg (ref 26.0–34.0)
MCHC: 32.8 g/dL (ref 30.0–36.0)
MCV: 92.6 fL (ref 80.0–100.0)
Platelets: 238 10*3/uL (ref 150–400)
RBC: 4.05 MIL/uL (ref 3.87–5.11)
RDW: 15 % (ref 11.5–15.5)
WBC: 13.8 10*3/uL — ABNORMAL HIGH (ref 4.0–10.5)
nRBC: 0 % (ref 0.0–0.2)

## 2020-10-14 LAB — BASIC METABOLIC PANEL
Anion gap: 8 (ref 5–15)
BUN: 17 mg/dL (ref 8–23)
CO2: 22 mmol/L (ref 22–32)
Calcium: 8.3 mg/dL — ABNORMAL LOW (ref 8.9–10.3)
Chloride: 107 mmol/L (ref 98–111)
Creatinine, Ser: 0.77 mg/dL (ref 0.44–1.00)
GFR, Estimated: >60 mL/min (ref >60)
Glucose, Bld: 115 mg/dL — ABNORMAL HIGH (ref 70–99)
Potassium: 3.6 mmol/L (ref 3.5–5.1)
Sodium: 137 mmol/L (ref 135–145)

## 2020-10-14 MED ORDER — DOXYCYCLINE HYCLATE 100 MG PO TABS
100.0000 mg | ORAL_TABLET | Freq: Two times a day (BID) | ORAL | Status: AC
  Administered 2020-10-14 – 2020-10-16 (×6): 100 mg via ORAL
  Filled 2020-10-14 (×6): qty 1

## 2020-10-14 NOTE — Progress Notes (Signed)
PROGRESS NOTE  Crystal Osborne [id number] DOB: 10-31-1931 DOA: 10/10/2020 PCP: Colon Branch, MD   LOS: 4 days   Brief Narrative / Interim history: 87 year old female with HTN, HOCM, tobacco use, COPD, hyperlipidemia comes into the hospital brought by her son that she was increasingly confused over the last 2 days.  Normally has no dementia and is alert and oriented x4 with normal cognitive function at baseline.  She was delirious in the ED, agitated.  Patient was experiencing some shoulder discomfort about 2 weeks ago for which she was prescribed tramadol.  She was prescribed 25 tablets and about 11 tablets left in the bottle.  Recently she was changed over from lisinopril to metoprolol after she was seen at the cardiology clinic.  She had an echocardiogram recently which showed hypertrophic cardiomyopathy.  No clear history of fever or chills.  No nausea or vomiting.  No recent falls or injuries.  She was diagnosed with left-sided lobar pneumonia and was admitted to the hospital.  COVID-19 was negative.  Subjective / 24h Interval events: She says she is feeling well today no shortness of breath.  Reports that she has been having a tremor since last night  Assessment & Plan: Principal Problem Acute metabolic encephalopathy, acute delirium-possibly multifactorial due to medication with tramadol, infectious process. -For most part she is alert and oriented x4, with intermittent confusion likely due to hospital stay. -Now with a tremor, possibly medication induced, will discontinue ceftriaxone (it is extremely rare but it can be the culprit) and transition to doxycycline, avoid Haldol  Active Problems Acute hypoxic respiratory failure due to community-acquired pneumonia -She was on nonrebreather at 1 point but has been weaned back down to 2 L this on hospital day 2 and currently she is on room air -Stop ceftriaxone/azithromycin due to tremors as above, today day #4 of antibiotics, transition to  doxycycline for 3 more days for 7-day course.  WBC improving  Right shoulder pain-possibly osteoarthritic in nature/small trauma history reports that occasionally she bumped her right shoulder into the wall when trying to ambulate.  She has good range of motion, no ecchymosis or fracture.  X-ray without acute findings.  Conservative management  Recently diagnosed hypertrophic cardiomyopathy-continue metoprolol  COPD, ongoing tobacco use-smokes half a pack a day, hopefully she will quit  Hyperlipidemia-continue statin due to LFT elevation  Elevated LFT-possibly due to acute illness, monitor, improving  Scheduled Meds:  doxycycline  100 mg Oral Q12H   enoxaparin (LOVENOX) injection  40 mg Subcutaneous Q24H   mouth rinse  15 mL Mouth Rinse BID   metoprolol succinate  12.5 mg Oral Daily   nicotine  14 mg Transdermal Q24H   thiamine  100 mg Oral Daily   Continuous Infusions:   PRN Meds:.acetaminophen **OR** acetaminophen, hydrALAZINE, ipratropium-albuterol, ondansetron **OR** ondansetron (ZOFRAN) IV  Diet Orders (From admission, onward)     Start     Ordered   10/10/20 1803  DIET DYS 3 Room service appropriate? Yes; Fluid consistency: Thin  Diet effective now       Question Answer Comment  Room service appropriate? Yes   Fluid consistency: Thin      10/10/20 1806            DVT prophylaxis: enoxaparin (LOVENOX) injection 40 mg Start: 10/10/20 1830     Code Status: DNR  Family Communication: No family at bedside this morning  Status is: Inpatient  Remains inpatient appropriate because:Inpatient level of care appropriate due to severity of illness  Dispo:  Patient From: Home  Planned Disposition: SNF  Medically stable for discharge: No    Level of care: Progressive  Consultants:  none  Procedures:  none  Microbiology  none  Antimicrobials: Ceftriaxone  / Azithromycin 8/31 >> 9/4 Doxycycline 9/4 >>   Objective: Vitals:   10/13/20 2107 10/13/20 2300  10/14/20 0400 10/14/20 0808  BP: (!) 168/72 (!) 144/54 (!) 185/84 (!) 145/76  Pulse: 88 77 (!) 47 85  Resp: (!) 23 (!) 25 (!) 25 20  Temp: 97.6 F (36.4 C) 97.6 F (36.4 C)  97.9 F (36.6 C)  TempSrc: Oral   Oral  SpO2: 94% 92% 92% 94%   No intake or output data in the 24 hours ending 10/14/20 1135  There were no vitals filed for this visit.  Examination:  Constitutional: No distress, visible subtle tremor Eyes: Anicteric ENMT: Moist mucous membranes Neck: normal, supple Respiratory: Lungs are clear to auscultation bilaterally, no wheezing, no crackles Cardiovascular: Heart is regular, no peripheral edema Abdomen: Nondistended, bowel sounds positive Musculoskeletal: no clubbing / cyanosis.   Skin: No new rashes appreciated Neurologic: Equal strength, nonfocal  Data Reviewed: I have independently reviewed following labs and imaging studies   CBC: Recent Labs  Lab 10/10/20 1524 10/11/20 0042 10/12/20 0358 10/13/20 0209 10/14/20 0112  WBC 12.9* 13.8* 15.2* 15.0* 13.8*  NEUTROABS 10.1*  --   --   --   --   HGB 14.1 11.7* 13.1 13.0 12.3  HCT 43.2 35.6* 40.0 39.0 37.5  MCV 93.9 94.2 94.3 93.1 92.6  PLT 238 175 197 206 [id number]    Basic Metabolic Panel: Recent Labs  Lab 10/10/20 1524 10/11/20 0042 10/12/20 0358 10/13/20 0209 10/14/20 0112  NA 136 135 134* 138 137  K 5.1 4.4 4.1 4.2 3.6  CL 97* 104 102 105 107  CO2 '30 23 23 22 22  '$ GLUCOSE 155* 114* 129* 91 115*  BUN '15 18 16 16 17  '$ CREATININE 0.97 0.94 0.92 0.84 0.77  CALCIUM 8.8* 7.6* 8.3* 8.6* 8.3*    Liver Function Tests: Recent Labs  Lab 10/10/20 1524 10/11/20 0042 10/12/20 0358  AST 28 112* 58*  ALT 30 120* 110*  ALKPHOS 76 112 97  BILITOT 1.4* 0.9 0.7  PROT 6.1* 4.6* 5.5*  ALBUMIN 3.3* 2.4* 2.8*    Coagulation Profile: No results for input(s): INR, PROTIME in the last 168 hours. HbA1C: No results for input(s): HGBA1C in the last 72 hours. CBG: Recent Labs  Lab 10/11/20 0010  GLUCAP 106*      Recent Results (from the past 240 hour(s))  Culture, blood (routine x 2)     Status: None (Preliminary result)   Collection Time: 10/10/20  3:00 PM   Specimen: BLOOD  Result Value Ref Range Status   Specimen Description BLOOD RIGHT ANTECUBITAL  Final   Special Requests   Final    BOTTLES DRAWN AEROBIC AND ANAEROBIC Blood Culture adequate volume   Culture   Final    NO GROWTH 4 DAYS Performed at [facility], [place] [address] [place], [place] [address]    Report Status PENDING  Incomplete  Resp Panel by RT-PCR (Flu A&B, Covid) Nasopharyngeal Swab     Status: None   Collection Time: 10/10/20  3:29 PM   Specimen: Nasopharyngeal Swab; Nasopharyngeal(NP) swabs in vial transport medium  Result Value Ref Range Status   SARS Coronavirus 2 by RT PCR NEGATIVE NEGATIVE Final    Comment: (NOTE) SARS-CoV-2 target nucleic acids are  NOT DETECTED.  The SARS-CoV-2 RNA is generally detectable in upper respiratory specimens during the acute phase of infection. The lowest concentration of SARS-CoV-2 viral copies this assay can detect is 138 copies/mL. A negative result does not preclude SARS-Cov-2 infection and should not be used as the sole basis for treatment or other patient management decisions. A negative result may occur with  improper specimen collection/handling, submission of specimen other than nasopharyngeal swab, presence of viral mutation(s) within the areas targeted by this assay, and inadequate number of viral copies(<138 copies/mL). A negative result must be combined with clinical observations, patient history, and epidemiological information. The expected result is Negative.  Fact Sheet for Patients:  EntrepreneurPulse.com.au  Fact Sheet for Healthcare Providers:  IncredibleEmployment.be  This test is no t yet approved or cleared by the [place] FDA and  has been authorized for detection and/or diagnosis of SARS-CoV-2 by FDA  under an Emergency Use Authorization (EUA). This EUA will remain  in effect (meaning this test can be used) for the duration of the COVID-19 declaration under Section 564(b)(1) of the Act, 21 U.S.C.section 360bbb-3(b)(1), unless the authorization is terminated  or revoked sooner.       Influenza A by PCR NEGATIVE NEGATIVE Final   Influenza B by PCR NEGATIVE NEGATIVE Final    Comment: (NOTE) The Xpert Xpress SARS-CoV-2/FLU/RSV plus assay is intended as an aid in the diagnosis of influenza from Nasopharyngeal swab specimens and should not be used as a sole basis for treatment. Nasal washings and aspirates are unacceptable for Xpert Xpress SARS-CoV-2/FLU/RSV testing.  Fact Sheet for Patients: EntrepreneurPulse.com.au  Fact Sheet for Healthcare Providers: IncredibleEmployment.be  This test is not yet approved or cleared by the [place] FDA and has been authorized for detection and/or diagnosis of SARS-CoV-2 by FDA under an Emergency Use Authorization (EUA). This EUA will remain in effect (meaning this test can be used) for the duration of the COVID-19 declaration under Section 564(b)(1) of the Act, 21 U.S.C. section 360bbb-3(b)(1), unless the authorization is terminated or revoked.  Performed at [facility], [place] [address] [place], [place] [address]   Culture, blood (routine x 2)     Status: None (Preliminary result)   Collection Time: 10/10/20  4:06 PM   Specimen: BLOOD RIGHT WRIST  Result Value Ref Range Status   Specimen Description BLOOD RIGHT WRIST  Final   Special Requests   Final    BOTTLES DRAWN AEROBIC AND ANAEROBIC Blood Culture results may not be optimal due to an inadequate volume of blood received in culture bottles   Culture   Final    NO GROWTH 4 DAYS Performed at [facility], [place] [address], [place], [place] [address]    Report Status PENDING  Incomplete  MRSA Next Gen by PCR, Nasal     Status: None    Collection Time: 10/11/20  4:52 AM   Specimen: Nasal Mucosa; Nasal Swab  Result Value Ref Range Status   MRSA by PCR Next Gen NOT DETECTED NOT DETECTED Final    Comment: (NOTE) The GeneXpert MRSA Assay (FDA approved for NASAL specimens only), is one component of a comprehensive MRSA colonization surveillance program. It is not intended to diagnose MRSA infection nor to guide or monitor treatment for MRSA infections. Test performance is not FDA approved in patients less than 61 years old. Performed at [facility], [place] [address], [place], [place] [address]       Radiology Studies: DG Shoulder Right  Port  Result Date: 10/13/2020 CLINICAL DATA:  Right shoulder pain. EXAM: PORTABLE RIGHT SHOULDER COMPARISON:  None. FINDINGS: No acute fracture or dislocation identified. Density following the course of the lateral humeral head may relate to prior surgery/rotator cuff repair. Mild degenerative disease of the [facility] joint. No bony lesions or destruction. Soft tissues are unremarkable. IMPRESSION: No acute findings. Density following the lateral humeral head may relate to prior rotator cuff repair. Electronically Signed   By: Aletta Edouard M.D.   On: 10/13/2020 14:00     Marzetta Board, MD, PhD [facility]  Between 7 am - 7 pm I am available, please contact me via Amion (for emergencies) or Securechat (non urgent messages)  Between 7 pm - 7 am I am not available, please contact night coverage MD/APP via Amion

## 2020-10-14 NOTE — Plan of Care (Signed)
  Problem: Education: Goal: Knowledge of General Education information will improve Description: Including pain rating scale, medication(s)/side effects and non-pharmacologic comfort measures Outcome: Progressing   Problem: Health Behavior/Discharge Planning: Goal: Ability to manage health-related needs will improve Outcome: Progressing   Problem: Clinical Measurements: Goal: Ability to maintain clinical measurements within normal limits will improve Outcome: Progressing Goal: Will remain free from infection Outcome: Progressing Goal: Diagnostic test results will improve Outcome: Progressing Goal: Respiratory complications will improve Outcome: Progressing Goal: Cardiovascular complication will be avoided Outcome: Progressing   Problem: Activity: Goal: Risk for activity intolerance will decrease Outcome: Progressing   Problem: Nutrition: Goal: Adequate nutrition will be maintained Outcome: Progressing   Problem: Coping: Goal: Level of anxiety will decrease Outcome: Progressing   Problem: Elimination: Goal: Will not experience complications related to bowel motility Outcome: Progressing Goal: Will not experience complications related to urinary retention Outcome: Progressing   Problem: Pain Managment: Goal: General experience of comfort will improve Outcome: Progressing   Problem: Safety: Goal: Ability to remain free from injury will improve Outcome: Progressing   Problem: Skin Integrity: Goal: Risk for impaired skin integrity will decrease Outcome: Progressing   Problem: Activity: Goal: Ability to tolerate increased activity will improve Outcome: Progressing   Problem: Clinical Measurements: Goal: Ability to maintain a body temperature in the normal range will improve Outcome: Progressing   Problem: Respiratory: Goal: Ability to maintain adequate ventilation will improve Outcome: Progressing Goal: Ability to maintain a clear airway will improve Outcome:  Progressing   Problem: Clinical Measurements: Goal: Ability to maintain clinical measurements within normal limits will improve Outcome: Progressing Goal: Will remain free from infection Outcome: Progressing Goal: Diagnostic test results will improve Outcome: Progressing Goal: Respiratory complications will improve Outcome: Progressing Goal: Cardiovascular complication will be avoided Outcome: Progressing   Problem: Nutrition: Goal: Adequate nutrition will be maintained Outcome: Progressing   Problem: Coping: Goal: Level of anxiety will decrease Outcome: Progressing

## 2020-10-14 NOTE — TOC Progression Note (Addendum)
Transition of Care [facility]) - Progression Note    Patient Details  Name: MALKIE SHELDEN MRN: [id number] Date of Birth: 1931-10-19  Transition of Care [facility]) CM/SW [place], [place] Phone Number: [phone] 10/14/2020, 4:23 PM  Clinical Narrative:     CSW received a follow up phone call from Larned at [facility] that they can accept pt on Tuesday. Kitty requested that the authorization be changed to the start date of 10/16/2020 due to that being admission date.  CSW called [employer] and was informed that the start date could not be changed due to the authorization still being valid. CSW was informed that the facility could call back to change it once pt was admitted.  Authorization has been approved; [employer] #- [id number] [employer] #- [id number] Auth dates: 9/3-9/6  [facility] team will continue to assist with discharge planning needs.   Expected Discharge Plan: [place] Barriers to Discharge: Continued Medical Work up, SNF Pending bed offer  Expected Discharge Plan and Services Expected Discharge Plan: [place] Choice: [place] arrangements for the past 2 months: Single Family Home                                       Social Determinants of Health (SDOH) Interventions    Readmission Risk Interventions No flowsheet data found.

## 2020-10-15 LAB — RESP PANEL BY RT-PCR (FLU A&B, COVID) ARPGX2
Influenza A by PCR: NEGATIVE
Influenza B by PCR: NEGATIVE
SARS Coronavirus 2 by RT PCR: NEGATIVE

## 2020-10-15 LAB — CULTURE, BLOOD (ROUTINE X 2)
Culture: NO GROWTH
Culture: NO GROWTH
Special Requests: ADEQUATE

## 2020-10-15 NOTE — Progress Notes (Signed)
PROGRESS NOTE  AMERICAS Crystal Crystal DOB: Nov 16, 1931 DOA: 10/10/2020 PCP: Colon Branch, MD   LOS: 5 days   Brief Narrative / Interim history: 87 year old female with HTN, HOCM, tobacco use, COPD, hyperlipidemia comes into the hospital brought by her son that she was increasingly confused over the last 2 days.  Normally has no dementia and is alert and oriented x4 with normal cognitive function at baseline.  She was delirious in the ED, agitated.  Patient was experiencing some shoulder discomfort about 2 weeks ago for which she was prescribed tramadol.  She was prescribed 25 tablets and about 11 tablets left in the bottle.  Recently she was changed over from lisinopril to metoprolol after she was seen at the cardiology clinic.  She had an echocardiogram recently which showed hypertrophic cardiomyopathy.  No clear history of fever or chills.  No nausea or vomiting.  No recent falls or injuries.  She was diagnosed with left-sided lobar pneumonia and was admitted to the hospital.  COVID-19 was negative.  Subjective / 24h Interval events: Feeling well this morning, no tremoring anymore.  No shortness of breath.  Assessment & Plan: Principal Problem Acute metabolic encephalopathy, acute delirium-possibly multifactorial due to medication with tramadol, infectious process. -For most part she is alert and oriented x4, with intermittent confusion likely due to hospital stay. -She developed a tremor on 9/4 possibly due to ceftriaxone/Haldol, medications discontinued and tremor resolved  Active Problems Acute hypoxic respiratory failure due to community-acquired pneumonia -She was on nonrebreather at 1 point but has been weaned back down to 2 L this on hospital day 2 and currently she is on room air -Currently on doxycycline day 5/7.  She was on ceftriaxone and azithromycin for the first 4 days  Right shoulder pain-possibly osteoarthritic in nature/small trauma history reports that occasionally she  bumped her right shoulder into the wall when trying to ambulate.  She has good range of motion, no ecchymosis or fracture.  X-ray without acute findings.  Conservative management  Recently diagnosed hypertrophic cardiomyopathy-continue metoprolol  COPD, ongoing tobacco use-smokes half a pack a day, hopefully she will quit  Hyperlipidemia-hold statin due to LFT elevation  Elevated LFT-possibly due to acute illness, now improving  Scheduled Meds:  doxycycline  100 mg Oral Q12H   enoxaparin (LOVENOX) injection  40 mg Subcutaneous Q24H   mouth rinse  15 mL Mouth Rinse BID   metoprolol succinate  12.5 mg Oral Daily   nicotine  14 mg Transdermal Q24H   thiamine  100 mg Oral Daily   Continuous Infusions:   PRN Meds:.acetaminophen **OR** acetaminophen, hydrALAZINE, ipratropium-albuterol, ondansetron **OR** ondansetron (ZOFRAN) IV  Diet Orders (From admission, onward)     Start     Ordered   10/10/20 1803  DIET DYS 3 Room service appropriate? Yes; Fluid consistency: Thin  Diet effective now       Question Answer Comment  Room service appropriate? Yes   Fluid consistency: Thin      10/10/20 1806            DVT prophylaxis: enoxaparin (LOVENOX) injection 40 mg Start: 10/10/20 1830     Code Status: DNR  Family Communication: Son at bedside  Status is: Inpatient  Remains inpatient appropriate because:Inpatient level of care appropriate due to severity of illness  Dispo:  Patient From: Home  Planned Disposition: SNF  Medically stable for discharge: No    Level of care: Progressive  Consultants:  none  Procedures:  none  Microbiology  none  Antimicrobials: Ceftriaxone  / Azithromycin 8/31 >> 9/4 Doxycycline 9/4 >>   Objective: Vitals:   10/14/20 2000 10/15/20 0000 10/15/20 0427 10/15/20 0721  BP: (!) 169/71 (!) 170/88 (!) 186/78 (!) 172/74  Pulse: 80 70 95 79  Resp: 20 (!) 25 (!) 22 20  Temp:   98 F (36.7 C) 98 F (36.7 C)  TempSrc:   Oral Oral   SpO2: 94% 90% 92% 91%   No intake or output data in the 24 hours ending 10/15/20 1033  There were no vitals filed for this visit.  Examination:  Constitutional: No distress Eyes: Anicteric ENMT: Moist mucous membranes Neck: normal, supple Respiratory: Clear bilaterally, no wheezing, no crackles, overall distant breath sounds Cardiovascular: Regular rate and rhythm, no peripheral edema Abdomen: Nondistended, bowel sounds positive Musculoskeletal: no clubbing / cyanosis.   Skin: No rashes seen Neurologic: No focal deficits  Data Reviewed: I have independently reviewed following labs and imaging studies   CBC: Recent Labs  Lab 10/10/20 1524 10/11/20 0042 10/12/20 0358 10/13/20 0209 10/14/20 0112  WBC 12.9* 13.8* 15.2* 15.0* 13.8*  NEUTROABS 10.1*  --   --   --   --   HGB 14.1 11.7* 13.1 13.0 12.3  HCT 43.2 35.6* 40.0 39.0 37.5  MCV 93.9 94.2 94.3 93.1 92.6  PLT 238 175 197 206 [id number]    Basic Metabolic Panel: Recent Labs  Lab 10/10/20 1524 10/11/20 0042 10/12/20 0358 10/13/20 0209 10/14/20 0112  NA 136 135 134* 138 137  K 5.1 4.4 4.1 4.2 3.6  CL 97* 104 102 105 107  CO2 '30 23 23 22 22  '$ GLUCOSE 155* 114* 129* 91 115*  BUN '15 18 16 16 17  '$ CREATININE 0.97 0.94 0.92 0.84 0.77  CALCIUM 8.8* 7.6* 8.3* 8.6* 8.3*    Liver Function Tests: Recent Labs  Lab 10/10/20 1524 10/11/20 0042 10/12/20 0358  AST 28 112* 58*  ALT 30 120* 110*  ALKPHOS 76 112 97  BILITOT 1.4* 0.9 0.7  PROT 6.1* 4.6* 5.5*  ALBUMIN 3.3* 2.4* 2.8*    Coagulation Profile: No results for input(s): INR, PROTIME in the last 168 hours. HbA1C: No results for input(s): HGBA1C in the last 72 hours. CBG: Recent Labs  Lab 10/11/20 0010  GLUCAP 106*     Recent Results (from the past 240 hour(s))  Culture, blood (routine x 2)     Status: None   Collection Time: 10/10/20  3:00 PM   Specimen: BLOOD  Result Value Ref Range Status   Specimen Description BLOOD RIGHT ANTECUBITAL  Final    Special Requests   Final    BOTTLES DRAWN AEROBIC AND ANAEROBIC Blood Culture adequate volume   Culture   Final    NO GROWTH 5 DAYS Performed at [facility], [address] [place] [address]    Report Status 10/15/2020 FINAL  Final  Resp Panel by RT-PCR (Flu A&B, Covid) Nasopharyngeal Swab     Status: None   Collection Time: 10/10/20  3:29 PM   Specimen: Nasopharyngeal Swab; Nasopharyngeal(NP) swabs in vial transport medium  Result Value Ref Range Status   SARS Coronavirus 2 by RT PCR NEGATIVE NEGATIVE Final    Comment: (NOTE) SARS-CoV-2 target nucleic acids are NOT DETECTED.  The SARS-CoV-2 RNA is generally detectable in upper respiratory specimens during the acute phase of infection. The lowest concentration of SARS-CoV-2 viral copies this assay can detect is 138 copies/mL. A negative result does not preclude SARS-Cov-2 infection and  should not be used as the sole basis for treatment or other patient management decisions. A negative result may occur with  improper specimen collection/handling, submission of specimen other than nasopharyngeal swab, presence of viral mutation(s) within the areas targeted by this assay, and inadequate number of viral copies(<138 copies/mL). A negative result must be combined with clinical observations, patient history, and epidemiological information. The expected result is Negative.  Fact Sheet for Patients:  EntrepreneurPulse.com.au  Fact Sheet for Healthcare Providers:  IncredibleEmployment.be  This test is no t yet approved or cleared by the [place] FDA and  has been authorized for detection and/or diagnosis of SARS-CoV-2 by FDA under an Emergency Use Authorization (EUA). This EUA will remain  in effect (meaning this test can be used) for the duration of the COVID-19 declaration under Section 564(b)(1) of the Act, 21 U.S.C.section 360bbb-3(b)(1), unless the authorization is terminated   or revoked sooner.       Influenza A by PCR NEGATIVE NEGATIVE Final   Influenza B by PCR NEGATIVE NEGATIVE Final    Comment: (NOTE) The Xpert Xpress SARS-CoV-2/FLU/RSV plus assay is intended as an aid in the diagnosis of influenza from Nasopharyngeal swab specimens and should not be used as a sole basis for treatment. Nasal washings and aspirates are unacceptable for Xpert Xpress SARS-CoV-2/FLU/RSV testing.  Fact Sheet for Patients: EntrepreneurPulse.com.au  Fact Sheet for Healthcare Providers: IncredibleEmployment.be  This test is not yet approved or cleared by the [place] FDA and has been authorized for detection and/or diagnosis of SARS-CoV-2 by FDA under an Emergency Use Authorization (EUA). This EUA will remain in effect (meaning this test can be used) for the duration of the COVID-19 declaration under Section 564(b)(1) of the Act, 21 U.S.C. section 360bbb-3(b)(1), unless the authorization is terminated or revoked.  Performed at [facility], [place] [address] [place], [place] [address]   Culture, blood (routine x 2)     Status: None   Collection Time: 10/10/20  4:06 PM   Specimen: BLOOD RIGHT WRIST  Result Value Ref Range Status   Specimen Description BLOOD RIGHT WRIST  Final   Special Requests   Final    BOTTLES DRAWN AEROBIC AND ANAEROBIC Blood Culture results may not be optimal due to an inadequate volume of blood received in culture bottles   Culture   Final    NO GROWTH 5 DAYS Performed at [facility], [place] [address], [place], [place] [address]    Report Status 10/15/2020 FINAL  Final  MRSA Next Gen by PCR, Nasal     Status: None   Collection Time: 10/11/20  4:52 AM   Specimen: Nasal Mucosa; Nasal Swab  Result Value Ref Range Status   MRSA by PCR Next Gen NOT DETECTED NOT DETECTED Final    Comment: (NOTE) The GeneXpert MRSA Assay (FDA approved for NASAL specimens only), is one component of a  comprehensive MRSA colonization surveillance program. It is not intended to diagnose MRSA infection nor to guide or monitor treatment for MRSA infections. Test performance is not FDA approved in patients less than 61 years old. Performed at [facility], [place] [address], [place],  [address]       Radiology Studies: No results found.   Marzetta Board, MD, PhD [facility]  Between 7 am - 7 pm I am available, please contact me via Amion (for emergencies) or Securechat (non urgent messages)  Between 7 pm - 7 am I am not available, please contact  night coverage MD/APP via Amion

## 2020-10-15 NOTE — Progress Notes (Signed)
Physical Therapy Treatment Patient Details Name: Crystal Osborne MRN: [id number] DOB: 1931-08-09 Today's Date: 10/15/2020    History of Present Illness 87 yo admitted 8/31 with AMS and hypoxia with head CT (-). PMhx: HTN, cardiomyopathy, COPD, HLD, DM    PT Comments    Pt tolerated treatment well with VSS throughout on RA. Pt demonstrated increased ambulation distance compared to previous session, requiring less assist. Also, pt required less assist on transfers. Pt progressing toward goals. Continue to recommend SNF as pt has decreased caregiver support at home. Will update based on progression.   spO2 95-97% on RA    Follow Up Recommendations  SNF     Equipment Recommendations  3in1 (PT)    Recommendations for Other Services       Precautions / Restrictions Precautions Precautions: Fall Restrictions Weight Bearing Restrictions: No    Mobility  Bed Mobility               General bed mobility comments: In recliner upon arrival    Transfers Overall transfer level: Needs assistance Equipment used: Rolling walker (2 wheeled) Transfers: Sit to/from Stand Sit to Stand: Min guard         General transfer comment: Verbal cues for hand placement. Min guard for safety and performed with increased time.  Ambulation/Gait Ambulation/Gait assistance: Min guard;+2 safety/equipment Gait Distance (Feet): 100 Feet Assistive device: Rolling walker (2 wheeled) Gait Pattern/deviations: Step-through pattern;Trunk flexed   Gait velocity interpretation: 1.31 - 2.62 ft/sec, indicative of limited community ambulator General Gait Details: Ambulated 100' prior to seated rest. Then ambulated an additional 100'. +2 for close chair follow. Verbal cues for upright posture and maintaining hips in RW.   Stairs             Wheelchair Mobility    Modified Rankin (Stroke Patients Only)       Balance       Sitting balance - Comments: In recliner   Standing balance  support: Bilateral upper extremity supported;During functional activity Standing balance-Leahy Scale: Poor Standing balance comment: BUE on RW                            Cognition Arousal/Alertness: Awake/alert Behavior During Therapy: WFL for tasks assessed/performed Overall Cognitive Status: Within Functional Limits for tasks assessed                                        Exercises General Exercises - Lower Extremity Long Arc Quad: AROM;Both;Seated;20 reps Hip Flexion/Marching: AROM;Both;20 reps;Seated    General Comments General comments (skin integrity, edema, etc.): VSS on RA: spO2 95-97%      Pertinent Vitals/Pain Pain Assessment: No/denies pain    Home Living                      Prior Function            PT Goals (current goals can now be found in the care plan section) Acute Rehab PT Goals Potential to Achieve Goals: Good Progress towards PT goals: Progressing toward goals    Frequency    Min 3X/week      PT Plan Current plan remains appropriate    Co-evaluation              AM-PAC PT "6 Clicks" Mobility   Outcome Measure  Help needed turning  from your back to your side while in a flat bed without using bedrails?: A Little Help needed moving from lying on your back to sitting on the side of a flat bed without using bedrails?: A Little Help needed moving to and from a bed to a chair (including a wheelchair)?: A Little Help needed standing up from a chair using your arms (e.g., wheelchair or bedside chair)?: A Little Help needed to walk in hospital room?: A Little Help needed climbing 3-5 steps with a railing? : A Lot 6 Click Score: 17    End of Session Equipment Utilized During Treatment: Gait belt Activity Tolerance: Patient tolerated treatment well Patient left: in chair;with call bell/phone within reach;with chair alarm set;with family/visitor present Nurse Communication: Mobility status PT Visit  Diagnosis: Other abnormalities of gait and mobility (R26.89);Difficulty in walking, not elsewhere classified (R26.2);Muscle weakness (generalized) (M62.81)     Time: [id number] PT Time Calculation (min) (ACUTE ONLY): 31 min  Charges:  $Gait Training: 8-22 mins $Therapeutic Exercise: 8-22 mins                     Louie Casa, SPT Acute Rehab: [phone]     Domingo Dimes 10/15/2020, 12:26 PM

## 2020-10-16 MED ORDER — ALBUTEROL SULFATE HFA 108 (90 BASE) MCG/ACT IN AERS: 2.0000 | INHALATION_SPRAY | Freq: Four times a day (QID) | RESPIRATORY_TRACT | 2 refills | Status: DC | PRN

## 2020-10-16 MED ORDER — ALPRAZOLAM 0.25 MG PO TABS: 0.2500 mg | ORAL_TABLET | Freq: Every evening | ORAL | 0 refills | Status: DC | PRN

## 2020-10-16 MED ORDER — DOXYCYCLINE HYCLATE 100 MG PO TABS: 100.0000 mg | ORAL_TABLET | Freq: Two times a day (BID) | ORAL | 0 refills | Status: AC

## 2020-10-16 NOTE — Plan of Care (Signed)
  Problem: Education: Goal: Knowledge of General Education information will improve Description: Including pain rating scale, medication(s)/side effects and non-pharmacologic comfort measures Outcome: Progressing   Problem: Health Behavior/Discharge Planning: Goal: Ability to manage health-related needs will improve Outcome: Progressing   Problem: Clinical Measurements: Goal: Ability to maintain clinical measurements within normal limits will improve Outcome: Progressing Goal: Will remain free from infection Outcome: Progressing Goal: Diagnostic test results will improve Outcome: Progressing Goal: Respiratory complications will improve Outcome: Progressing Goal: Cardiovascular complication will be avoided Outcome: Progressing   Problem: Activity: Goal: Risk for activity intolerance will decrease Outcome: Progressing   Problem: Nutrition: Goal: Adequate nutrition will be maintained Outcome: Progressing   Problem: Coping: Goal: Level of anxiety will decrease Outcome: Progressing   Problem: Elimination: Goal: Will not experience complications related to bowel motility Outcome: Progressing Goal: Will not experience complications related to urinary retention Outcome: Progressing   Problem: Pain Managment: Goal: General experience of comfort will improve Outcome: Progressing   Problem: Safety: Goal: Ability to remain free from injury will improve Outcome: Progressing   Problem: Skin Integrity: Goal: Risk for impaired skin integrity will decrease Outcome: Progressing   Problem: Activity: Goal: Ability to tolerate increased activity will improve Outcome: Progressing   Problem: Clinical Measurements: Goal: Ability to maintain a body temperature in the normal range will improve Outcome: Progressing   Problem: Respiratory: Goal: Ability to maintain adequate ventilation will improve Outcome: Progressing Goal: Ability to maintain a clear airway will improve Outcome:  Progressing   Problem: Clinical Measurements: Goal: Ability to maintain clinical measurements within normal limits will improve Outcome: Progressing Goal: Will remain free from infection Outcome: Progressing Goal: Diagnostic test results will improve Outcome: Progressing Goal: Respiratory complications will improve Outcome: Progressing Goal: Cardiovascular complication will be avoided Outcome: Progressing   Problem: Nutrition: Goal: Adequate nutrition will be maintained Outcome: Progressing   Problem: Coping: Goal: Level of anxiety will decrease Outcome: Progressing

## 2020-10-16 NOTE — Progress Notes (Signed)
Patient discharged this morning, and after discharge she mentioned to the RN that she has been having difficulties with swallowing food. I discussed again with the patient, no history of dysphagia. This started few days ago but she didn't tell anybody unfortunately. She tells me that she feels like she has difficulties swallowing, she feels scared that she is about to choke. She was able to eat some mashed foods today such as a banana, but gets scared if food has increased consistency. Denies odynophagia or sensation that food is getting stuck.   Will cancel discharge, consulted SLP  Brekyn Huntoon M. Cruzita Lederer, MD, PhD [facility]  Between 7 am - 7 pm you can contact me via Amion (for emergencies) or [place] (non urgent matters).  I am not available 7 pm - 7 am, please contact night coverage MD/APP via Amion

## 2020-10-16 NOTE — Discharge Summary (Signed)
Physician Discharge Summary  Crystal Osborne [id number] DOB: [birth year] DOA: 10/10/2020  PCP: Colon Branch, MD  Admit date: 10/10/2020 Discharge date: 10/16/2020  Admitted From: home Disposition:  SNF  Recommendations for Outpatient Follow-up:  Follow up with PCP in 1-2 weeks Continue doxycycline for 2 more days  Home Health: None Equipment/Devices: None  Discharge Condition: Stable CODE STATUS: DNR Diet recommendation: Regular diet  HPI: Per admitting MD, Crystal Osborne is a 87 y.o. female with a past medical history of essential hypertension, hypertrophic cardiomyopathy, tobacco abuse, history of COPD, hyperlipidemia who was in her usual state of health till the last few days when patient's son noticed that patient was confused.  She usually has normal cognitive function at baseline.  Denies any history of dementia.  Patient is quite agitated and unable to provide any information at this time.  Most of the history was obtained from the son.  Hence history is limited.  Patient was experiencing some shoulder discomfort about 2 weeks ago for which she was prescribed tramadol.  She was prescribed 25 tablets and about 11 tablets left in the bottle.  Recently she was changed over from lisinopril to metoprolol after she was seen at the cardiology clinic.  She had an echocardiogram recently which showed hypertrophic cardiomyopathy.  No clear history of fever or chills.  No nausea or vomiting.  No recent falls or injuries. In the emergency department she was found to have a temperature of 97.3.  Noted to be mildly tachypneic and hypertensive.  Initially saturations were noted to be 96% on 2 L.  Subsequently she was noted to be more hypoxic with saturations in the 80s.  She was placed on a nonrebreather.  Chest x-ray raised concern for left-sided pneumonia.  COVID-19 test was negative.  WBC was 12.9.  Lactic acid level 1.8.  UA did not suggest infection.  She is clearly delirious.  She will need  hospitalization for further management.  Hospital Course / Discharge diagnoses: Principal Problem Acute metabolic encephalopathy, acute delirium-possibly multifactorial due to medication with tramadol, infectious process.  She has been having intermittent nightly confusion likely due to hospital stay. She developed a tremor on 9/4 possibly due to ceftriaxone/Haldol, medications discontinued and tremor resolved.  Currently she is alert and oriented x4   Active Problems Acute hypoxic respiratory failure due to community-acquired pneumonia -She was on nonrebreather on admission, she was placed on broad-spectrum antibiotics with ceftriaxone and azithromycin with improvement in her respiratory status.  Once ceftriaxone was discontinued due to tremor as above she was switched to doxycycline and has 2 additional days left for 7-day course upon discharge.  Right shoulder pain-possibly osteoarthritic in nature/small trauma history reports that occasionally she bumped her right shoulder into the wall when trying to ambulate.  She has good range of motion, no ecchymosis or fracture.  X-ray without acute findings.  Conservative management Recently diagnosed hypertrophic cardiomyopathy-continue metoprolol COPD, ongoing tobacco use-smokes half a pack a day, hopefully she will quit Hyperlipidemia-resume home statin Elevated LFT-possibly due to acute illness, now improving  Sepsis ruled out   Discharge Instructions   Allergies as of 10/16/2020       Reactions   Septra [sulfamethoxazole-trimethoprim] Itching   Pt states she had a severe allergic reaction; she was in the ICU for 2 days because of taking this.   Ciprofloxacin Hcl Itching   Codeine Rash   Made her feel very strange        Medication List  TAKE these medications    acetaminophen 500 MG tablet Commonly known as: TYLENOL Take 1,000 mg by mouth 2 (two) times daily as needed (for pain.).   albuterol 108 (90 Base) MCG/ACT  inhaler Commonly known as: VENTOLIN HFA Inhale 2 puffs into the lungs every 6 (six) hours as needed for wheezing or shortness of breath.   ALPRAZolam 0.25 MG tablet Commonly known as: XANAX Take 1-2 tablets (0.25-0.5 mg total) by mouth at bedtime as needed for sleep.   AMBULATORY NON FORMULARY MEDICATION Right Breast Prosthesis  Dx: [id number]   aspirin EC 81 MG tablet Take 81 mg by mouth every evening.   atorvastatin 40 MG tablet Commonly known as: LIPITOR Take 1 tablet (40 mg total) by mouth daily. What changed: when to take this   doxycycline 100 MG tablet Commonly known as: VIBRA-TABS Take 1 tablet (100 mg total) by mouth every 12 (twelve) hours for 2 days.   metoprolol succinate 25 MG 24 hr tablet Commonly known as: Toprol XL Take 0.5 tablets (12.5 mg total) by mouth daily.   POTASSIUM PO Take 1 tablet by mouth daily. OTC   psyllium 28 % packet Commonly known as: METAMUCIL SMOOTH TEXTURE Take 1 packet by mouth 2 (two) times daily as needed (constipation).        Contact information for after-discharge care     Destination     [facility] Preferred SNF .   Service: Skilled Nursing Contact information: [id number] [place] [phone]                     Consultations: None  Procedures/Studies:  DG Chest 1 View  Result Date: 10/10/2020 CLINICAL DATA:  Altered mental status, shortness of breath EXAM: CHEST  1 VIEW COMPARISON:  Chest radiograph 11/09/2015 FINDINGS: The heart is enlarged. The mediastinal contours are within normal limits. There is calcified atherosclerotic plaque of the aortic arch. There are coarsened interstitial markings throughout both lungs, likely chronic. There is a small left pleural effusion with adjacent airspace disease. The right costophrenic angle is cut off. There is no overt pulmonary edema. There is no pneumothorax. There is no acute osseous abnormality. IMPRESSION: 1.  Small left pleural effusion with adjacent airspace disease which may reflect atelectasis or pneumonia. 2. Mild cardiomegaly. Electronically Signed   By: Valetta Mole M.D.   On: 10/10/2020 16:01   CT Head Wo Contrast  Result Date: 10/10/2020 CLINICAL DATA:  Mental status change EXAM: CT HEAD WITHOUT CONTRAST TECHNIQUE: Contiguous axial images were obtained from the base of the skull through the vertex without intravenous contrast. COMPARISON:  CT 07/20/2016, MRI 09/22/2020 FINDINGS: Brain: Motion degradation. No acute territorial infarction, hemorrhage or intracranial mass. Moderate severe chronic small vessel ischemic changes of the white matter. Chronic lacunar infarct within the left white matter and basal ganglia. Chronic left cerebellar infarct. Stable ventricle size. Atrophy Vascular: No hyperdense vessels. Carotid vascular and vertebral calcification Skull: Normal. Negative for fracture or focal lesion. Sinuses/Orbits: No acute finding. Other: None IMPRESSION: 1. Motion degraded study. 2. No definite CT evidence for acute intracranial abnormality. Atrophy and chronic small vessel ischemic changes of the white matter. Electronically Signed   By: Donavan Foil M.D.   On: 10/10/2020 19:42   MR Brain Wo Contrast  Result Date: 09/23/2020 CLINICAL DATA:  Dizziness, persistent/recurrent, cardiac or vascular cause suspected. Ataxia 2-3 months. EXAM: MRI HEAD WITHOUT CONTRAST TECHNIQUE: Multiplanar, multiecho pulse sequences of the brain and surrounding structures  were obtained without intravenous contrast. COMPARISON:  Head CT 07/20/2016.  MRI 11/16/2005. FINDINGS: Brain: Diffusion imaging does not show any acute or subacute infarction. No focal abnormality affects the brainstem. Old small vessel infarction within the left cerebellum. Cerebral hemispheres show moderate chronic small-vessel ischemic changes throughout the white matter. No cortical or large vessel territory infarction. No mass lesion,  hemorrhage, hydrocephalus or extra-axial collection. Findings are progressive since 2007 as expected. Vascular: Major vessels at the base of the brain show flow. Skull and upper cervical spine: Negative Sinuses/Orbits: Clear/normal Other: None IMPRESSION: No acute or reversible finding. Old left cerebellar small vessel infarction. Moderate chronic small-vessel ischemic changes of the cerebral hemispheric white matter, progressive since 2007. Electronically Signed   By: Nelson Chimes M.D.   On: 09/23/2020 15:35   DG Shoulder Right Port  Result Date: 10/13/2020 CLINICAL DATA:  Right shoulder pain. EXAM: PORTABLE RIGHT SHOULDER COMPARISON:  None. FINDINGS: No acute fracture or dislocation identified. Density following the course of the lateral humeral head may relate to prior surgery/rotator cuff repair. Mild degenerative disease of the [facility] joint. No bony lesions or destruction. Soft tissues are unremarkable. IMPRESSION: No acute findings. Density following the lateral humeral head may relate to prior rotator cuff repair. Electronically Signed   By: Aletta Edouard M.D.   On: 10/13/2020 14:00   ECHOCARDIOGRAM COMPLETE  Result Date: 10/01/2020    ECHOCARDIOGRAM REPORT   Patient Name:   Crystal Osborne Date of Exam: 10/01/2020 Medical Rec #:  [id number]     Height:       65.0 in Accession #:    [id number]    Weight:       109.3 lb Date of Birth:  11/13/1931     BSA:          1.530 m Patient Age:    87 years      BP:           168/82 mmHg Patient Gender: F             HR:           43 bpm. Exam Location:  [facility] Procedure: 2D Echo, Intracardiac Opacification Agent, Cardiac Doppler and Color            Doppler Indications:    R55 Pre-Syncope  History:        Patient has prior history of Echocardiogram examinations, most                 recent 09/25/2016. CAD, PAD, COPD and TIA, Arrythmias:WPW; Risk                 Factors:Diabetes and HLD.  Sonographer:    Marygrace Drought RCS Referring Phys: [id number] [place]  1. Significant hypertrophy of LV apex with slit like cavity of LV apex. Frequent ectopy/bigeminy, makes assessment of LVEF difficult OVerall LVEF appears mildly decreased.. The left ventricular internal cavity size was mildly dilated.  2. Right ventricular systolic function is low normal. The right ventricular size is normal. There is mildly elevated pulmonary artery systolic pressure.  3. Left atrial size was mildly dilated.  4. Mild mitral valve regurgitation.  5. Tricuspid valve regurgitation is mild to moderate.  6. The aortic valve is tricuspid. Aortic valve regurgitation is not visualized. Mild to moderate aortic valve sclerosis/calcification is present, without any evidence of aortic stenosis.  7. The inferior vena cava is normal in size with greater than 50% respiratory variability, suggesting right  atrial pressure of 3 mmHg. FINDINGS  Left Ventricle: Significant hypertrophy of LV apex with slit like cavity of LV apex. Frequent ectopy/bigeminy, makes assessment of LVEF difficult OVerall LVEF appears mildly decreased. The left ventricular internal cavity size was mildly dilated. There is apical left ventricular hypertrophy. Right Ventricle: The right ventricular size is normal. Right vetricular wall thickness was not assessed. Right ventricular systolic function is low normal. There is mildly elevated pulmonary artery systolic pressure. The tricuspid regurgitant velocity is  3.18 m/s, and with an assumed right atrial pressure of 3 mmHg, the estimated right ventricular systolic pressure is [id number] mmHg. Left Atrium: Left atrial size was mildly dilated. Right Atrium: Right atrial size was normal in size. Pericardium: There is no evidence of pericardial effusion. Mitral Valve: There is mild thickening of the mitral valve leaflet(s). Mild to moderate mitral annular calcification. Mild mitral valve regurgitation. Tricuspid Valve: The tricuspid valve is normal in structure. Tricuspid valve  regurgitation is mild to moderate. Aortic Valve: The aortic valve is tricuspid. Aortic valve regurgitation is not visualized. Mild to moderate aortic valve sclerosis/calcification is present, without any evidence of aortic stenosis. Pulmonic Valve: The pulmonic valve was normal in structure. Pulmonic valve regurgitation is trivial. Aorta: The aortic root and ascending aorta are structurally normal, with no evidence of dilitation. Venous: The inferior vena cava is normal in size with greater than 50% respiratory variability, suggesting right atrial pressure of 3 mmHg. IAS/Shunts: No atrial level shunt detected by color flow Doppler.  LEFT VENTRICLE PLAX 2D LVIDd:         5.30 cm  Diastology LVIDs:         4.30 cm  LV e' medial:    4.79 cm/s LV PW:         1.00 cm  LV E/e' medial:  23.0 LV IVS:        1.00 cm  LV e' lateral:   9.14 cm/s LVOT diam:     1.80 cm  LV E/e' lateral: 12.0 LV SV:         53 LV SV Index:   35 LVOT Area:     2.54 cm  RIGHT VENTRICLE RV Basal diam:  4.00 cm RVSP:           43.4 mmHg LEFT ATRIUM             Index       RIGHT ATRIUM           Index LA diam:        3.10 cm 2.03 cm/m  RA Pressure: 3.00 mmHg LA Vol (A2C):   54.5 ml 35.61 ml/m RA Area:     14.70 cm LA Vol (A4C):   54.7 ml 35.74 ml/m RA Volume:   37.40 ml  24.44 ml/m LA Biplane Vol: 56.3 ml 36.79 ml/m  AORTIC VALVE LVOT Vmax:   89.40 cm/s LVOT Vmean:  63.400 cm/s LVOT VTI:    0.208 m  AORTA Ao Root diam: 3.00 cm Ao Asc diam:  2.90 cm MITRAL VALVE                TRICUSPID VALVE MV Area (PHT):              TR Peak grad:   40.4 mmHg MV Decel Time:              TR Vmax:        318.00 cm/s MR Peak grad: 135.5 mmHg    Estimated RAP:  3.00  mmHg MR Mean grad: 93.0 mmHg     RVSP:           43.4 mmHg MR Vmax:      582.00 cm/s MR Vmean:     458.0 cm/s    SHUNTS MV E velocity: 110.00 cm/s  Systemic VTI:  0.21 m MV A velocity: 48.00 cm/s   Systemic Diam: 1.80 cm MV E/A ratio:  2.29 Dorris Carnes MD Electronically signed by Dorris Carnes MD  Signature Date/Time: 10/01/2020/10:11:08 PM    Final    LONG TERM MONITOR (3-14 DAYS)  Result Date: 10/02/2020  Frequent PVCs (15% of beats)  7 episodes of SVT, longest lasting 14 seconds with average rate 107 bpm  1 episode of NSVT lasting 8 beats  Patch Wear Time:  3 days and 3 hours (2022-08-07T09:07:09-398 to 2022-08-10T13:04:10-398) Patient had a min HR of 52 bpm, max HR of 182 bpm, and avg HR of 70 bpm. Predominant underlying rhythm was Sinus Rhythm. 1 run of Ventricular Tachycardia occurred lasting 8 beats with a max rate of 141 bpm (avg 131 bpm). 7 Supraventricular Tachycardia runs occurred, the run with the fastest interval lasting 7 beats with a max rate of 182 bpm, the longest lasting 13.6 secs with an avg rate of 107 bpm. Isolated SVEs were rare (<1.0%), SVE Couplets were rare (<1.0%), and SVE Triplets were rare (<1.0%). Isolated VEs were frequent (15.2%, 48737), VE Couplets were rare (<1.0%, 564), and VE Triplets were rare (<1.0%, 66). Ventricular Bigeminy and Trigeminy were present.  6 patient triggered events, corresponding to sinus rhythm  PACs/PVCs and ventricular bigeminy.     Subjective: - no chest pain, shortness of breath, no abdominal pain, nausea or vomiting.   Discharge Exam: BP (!) 148/69 (BP Location: Left Arm)   Pulse 68   Temp 98 F (36.7 C) (Oral)   Resp 20   SpO2 93%   General: Pt is alert, awake, not in acute distress Cardiovascular: RRR, S1/S2 +, no rubs, no gallops Respiratory: CTA bilaterally, no wheezing, no rhonchi Abdominal: Soft, NT, ND, bowel sounds + Extremities: no edema, no cyanosis   The results of significant diagnostics from this hospitalization (including imaging, microbiology, ancillary and laboratory) are listed below for reference.     Microbiology: Recent Results (from the past 240 hour(s))  Culture, blood (routine x 2)     Status: None   Collection Time: 10/10/20  3:00 PM   Specimen: BLOOD  Result Value Ref Range Status    Specimen Description BLOOD RIGHT ANTECUBITAL  Final   Special Requests   Final    BOTTLES DRAWN AEROBIC AND ANAEROBIC Blood Culture adequate volume   Culture   Final    NO GROWTH 5 DAYS Performed at [facility], [address], [place], [place] [address]    Report Status 10/15/2020 FINAL  Final  Resp Panel by RT-PCR (Flu A&B, Covid) Nasopharyngeal Swab     Status: None   Collection Time: 10/10/20  3:29 PM   Specimen: Nasopharyngeal Swab; Nasopharyngeal(NP) swabs in vial transport medium  Result Value Ref Range Status   SARS Coronavirus 2 by RT PCR NEGATIVE NEGATIVE Final    Comment: (NOTE) SARS-CoV-2 target nucleic acids are NOT DETECTED.  The SARS-CoV-2 RNA is generally detectable in upper respiratory specimens during the acute phase of infection. The lowest concentration of SARS-CoV-2 viral copies this assay can detect is 138 copies/mL. A negative result does not preclude SARS-Cov-2 infection and should not be used as the sole basis  for treatment or other patient management decisions. A negative result may occur with  improper specimen collection/handling, submission of specimen other than nasopharyngeal swab, presence of viral mutation(s) within the areas targeted by this assay, and inadequate number of viral copies(<138 copies/mL). A negative result must be combined with clinical observations, patient history, and epidemiological information. The expected result is Negative.  Fact Sheet for Patients:  EntrepreneurPulse.com.au  Fact Sheet for Healthcare Providers:  IncredibleEmployment.be  This test is no t yet approved or cleared by the [place] FDA and  has been authorized for detection and/or diagnosis of SARS-CoV-2 by FDA under an Emergency Use Authorization (EUA). This EUA will remain  in effect (meaning this test can be used) for the duration of the COVID-19 declaration under Section 564(b)(1) of the Act, 21 U.S.C.section  360bbb-3(b)(1), unless the authorization is terminated  or revoked sooner.       Influenza A by PCR NEGATIVE NEGATIVE Final   Influenza B by PCR NEGATIVE NEGATIVE Final    Comment: (NOTE) The Xpert Xpress SARS-CoV-2/FLU/RSV plus assay is intended as an aid in the diagnosis of influenza from Nasopharyngeal swab specimens and should not be used as a sole basis for treatment. Nasal washings and aspirates are unacceptable for Xpert Xpress SARS-CoV-2/FLU/RSV testing.  Fact Sheet for Patients: EntrepreneurPulse.com.au  Fact Sheet for Healthcare Providers: IncredibleEmployment.be  This test is not yet approved or cleared by the [place] FDA and has been authorized for detection and/or diagnosis of SARS-CoV-2 by FDA under an Emergency Use Authorization (EUA). This EUA will remain in effect (meaning this test can be used) for the duration of the COVID-19 declaration under Section 564(b)(1) of the Act, 21 U.S.C. section 360bbb-3(b)(1), unless the authorization is terminated or revoked.  Performed at [facility], [place] [address] [place], [place] [address]   Culture, blood (routine x 2)     Status: None   Collection Time: 10/10/20  4:06 PM   Specimen: BLOOD RIGHT WRIST  Result Value Ref Range Status   Specimen Description BLOOD RIGHT WRIST  Final   Special Requests   Final    BOTTLES DRAWN AEROBIC AND ANAEROBIC Blood Culture results may not be optimal due to an inadequate volume of blood received in culture bottles   Culture   Final    NO GROWTH 5 DAYS Performed at [facility], [place] [address], [place], [place] [address]    Report Status 10/15/2020 FINAL  Final  MRSA Next Gen by PCR, Nasal     Status: None   Collection Time: 10/11/20  4:52 AM   Specimen: Nasal Mucosa; Nasal Swab  Result Value Ref Range Status   MRSA by PCR Next Gen NOT DETECTED NOT DETECTED Final    Comment: (NOTE) The GeneXpert MRSA Assay (FDA approved for  NASAL specimens only), is one component of a comprehensive MRSA colonization surveillance program. It is not intended to diagnose MRSA infection nor to guide or monitor treatment for MRSA infections. Test performance is not FDA approved in patients less than 4 years old. Performed at [facility], [place] [address], [place], [place] [address]   Resp Panel by RT-PCR (Flu A&B, Covid) Nasopharyngeal Swab     Status: None   Collection Time: 10/15/20  3:54 PM   Specimen: Nasopharyngeal Swab; Nasopharyngeal(NP) swabs in vial transport medium  Result Value Ref Range Status   SARS Coronavirus 2 by RT PCR NEGATIVE NEGATIVE Final    Comment: (NOTE) SARS-CoV-2 target nucleic acids  are NOT DETECTED.  The SARS-CoV-2 RNA is generally detectable in upper respiratory specimens during the acute phase of infection. The lowest concentration of SARS-CoV-2 viral copies this assay can detect is 138 copies/mL. A negative result does not preclude SARS-Cov-2 infection and should not be used as the sole basis for treatment or other patient management decisions. A negative result may occur with  improper specimen collection/handling, submission of specimen other than nasopharyngeal swab, presence of viral mutation(s) within the areas targeted by this assay, and inadequate number of viral copies(<138 copies/mL). A negative result must be combined with clinical observations, patient history, and epidemiological information. The expected result is Negative.  Fact Sheet for Patients:  EntrepreneurPulse.com.au  Fact Sheet for Healthcare Providers:  IncredibleEmployment.be  This test is no t yet approved or cleared by the [place] FDA and  has been authorized for detection and/or diagnosis of SARS-CoV-2 by FDA under an Emergency Use Authorization (EUA). This EUA will remain  in effect (meaning this test can be used) for the duration of the COVID-19 declaration under  Section 564(b)(1) of the Act, 21 U.S.C.section 360bbb-3(b)(1), unless the authorization is terminated  or revoked sooner.       Influenza A by PCR NEGATIVE NEGATIVE Final   Influenza B by PCR NEGATIVE NEGATIVE Final    Comment: (NOTE) The Xpert Xpress SARS-CoV-2/FLU/RSV plus assay is intended as an aid in the diagnosis of influenza from Nasopharyngeal swab specimens and should not be used as a sole basis for treatment. Nasal washings and aspirates are unacceptable for Xpert Xpress SARS-CoV-2/FLU/RSV testing.  Fact Sheet for Patients: EntrepreneurPulse.com.au  Fact Sheet for Healthcare Providers: IncredibleEmployment.be  This test is not yet approved or cleared by the [place] FDA and has been authorized for detection and/or diagnosis of SARS-CoV-2 by FDA under an Emergency Use Authorization (EUA). This EUA will remain in effect (meaning this test can be used) for the duration of the COVID-19 declaration under Section 564(b)(1) of the Act, 21 U.S.C. section 360bbb-3(b)(1), unless the authorization is terminated or revoked.  Performed at [facility], [place] [address], [place], [place] [address]      Labs: Basic Metabolic Panel: Recent Labs  Lab 10/10/20 1524 10/11/20 0042 10/12/20 0358 10/13/20 0209 10/14/20 0112  NA 136 135 134* 138 137  K 5.1 4.4 4.1 4.2 3.6  CL 97* 104 102 105 107  CO2 '30 23 23 22 22  '$ GLUCOSE 155* 114* 129* 91 115*  BUN '15 18 16 16 17  '$ CREATININE 0.97 0.94 0.92 0.84 0.77  CALCIUM 8.8* 7.6* 8.3* 8.6* 8.3*   Liver Function Tests: Recent Labs  Lab 10/10/20 1524 10/11/20 0042 10/12/20 0358  AST 28 112* 58*  ALT 30 120* 110*  ALKPHOS 76 112 97  BILITOT 1.4* 0.9 0.7  PROT 6.1* 4.6* 5.5*  ALBUMIN 3.3* 2.4* 2.8*   CBC: Recent Labs  Lab 10/10/20 1524 10/11/20 0042 10/12/20 0358 10/13/20 0209 10/14/20 0112  WBC 12.9* 13.8* 15.2* 15.0* 13.8*  NEUTROABS 10.1*  --   --   --   --   HGB 14.1  11.7* 13.1 13.0 12.3  HCT 43.2 35.6* 40.0 39.0 37.5  MCV 93.9 94.2 94.3 93.1 92.6  PLT 238 175 197 206 238   CBG: Recent Labs  Lab 10/11/20 0010  GLUCAP 106*   Hgb A1c No results for input(s): HGBA1C in the last 72 hours. Lipid Profile No results for input(s): CHOL, HDL, LDLCALC, TRIG, CHOLHDL, LDLDIRECT in the last 72 hours. Thyroid  function studies No results for input(s): TSH, T4TOTAL, T3FREE, THYROIDAB in the last 72 hours.  Invalid input(s): FREET3 Urinalysis    Component Value Date/Time   COLORURINE YELLOW 10/10/2020 [place] (A) 10/10/2020 1645   LABSPEC 1.010 10/10/2020 1645   PHURINE 5.0 10/10/2020 1645   GLUCOSEU NEGATIVE 10/10/2020 1645   GLUCOSEU NEGATIVE 08/21/2014 1027   HGBUR MODERATE (A) 10/10/2020 1645   HGBUR negative 09/25/2009 [place] 10/10/2020 [place] 05/26/2013 [place] 10/10/2020 1645   PROTEINUR 100 (A) 10/10/2020 1645   UROBILINOGEN 0.2 08/21/2014 1027   NITRITE POSITIVE (A) 10/10/2020 1645   LEUKOCYTESUR NEGATIVE 10/10/2020 1645    FURTHER DISCHARGE INSTRUCTIONS:   Get Medicines reviewed and adjusted: Please take all your medications with you for your next visit with your Primary MD   Laboratory/radiological data: Please request your Primary MD to go over all hospital tests and procedure/radiological results at the follow up, please ask your Primary MD to get all Hospital records sent to his/her office.   In some cases, they will be blood work, cultures and biopsy results pending at the time of your discharge. Please request that your primary care M.D. goes through all the records of your hospital data and follows up on these results.   Also Note the following: If you experience worsening of your admission symptoms, develop shortness of breath, life threatening emergency, suicidal or homicidal thoughts you must seek medical attention immediately by calling 911 or calling  your MD immediately  if symptoms less severe.   You must read complete instructions/literature along with all the possible adverse reactions/side effects for all the Medicines you take and that have been prescribed to you. Take any new Medicines after you have completely understood and accpet all the possible adverse reactions/side effects.    Do not drive when taking Pain medications or sleeping medications (Benzodaizepines)   Do not take more than prescribed Pain, Sleep and Anxiety Medications. It is not advisable to combine anxiety,sleep and pain medications without talking with your primary care practitioner   Special Instructions: If you have smoked or chewed Tobacco  in the last 2 yrs please stop smoking, stop any regular Alcohol  and or any Recreational drug use.   Wear Seat belts while driving.   Please note: You were cared for by a hospitalist during your hospital stay. Once you are discharged, your primary care physician will handle any further medical issues. Please note that NO REFILLS for any discharge medications will be authorized once you are discharged, as it is imperative that you return to your primary care physician (or establish a relationship with a primary care physician if you do not have one) for your post hospital discharge needs so that they can reassess your need for medications and monitor your lab values.  Time coordinating discharge: 40 minutes  SIGNED:  Marzetta Board, MD, PhD 10/16/2020, 9:42 AM

## 2020-10-17 DIAGNOSIS — I10 Essential (primary) hypertension: Secondary | ICD-10-CM

## 2020-10-17 DIAGNOSIS — R1310 Dysphagia, unspecified: Secondary | ICD-10-CM

## 2020-10-17 DIAGNOSIS — I422 Other hypertrophic cardiomyopathy: Secondary | ICD-10-CM

## 2020-10-17 LAB — CBC WITH DIFFERENTIAL/PLATELET
Abs Immature Granulocytes: 0.04 10*3/uL (ref 0.00–0.07)
Basophils Absolute: 0 10*3/uL (ref 0.0–0.1)
Basophils Relative: 0 %
Eosinophils Absolute: 0 10*3/uL (ref 0.0–0.5)
Eosinophils Relative: 0 %
HCT: 39.7 % (ref 36.0–46.0)
Hemoglobin: 13 g/dL (ref 12.0–15.0)
Immature Granulocytes: 0 %
Lymphocytes Relative: 12 %
Lymphs Abs: 1.3 10*3/uL (ref 0.7–4.0)
MCH: 30.4 pg (ref 26.0–34.0)
MCHC: 32.7 g/dL (ref 30.0–36.0)
MCV: 93 fL (ref 80.0–100.0)
Monocytes Absolute: 0.6 10*3/uL (ref 0.1–1.0)
Monocytes Relative: 5 %
Neutro Abs: 8.6 10*3/uL — ABNORMAL HIGH (ref 1.7–7.7)
Neutrophils Relative %: 83 %
Platelets: 249 10*3/uL (ref 150–400)
RBC: 4.27 MIL/uL (ref 3.87–5.11)
RDW: 14.9 % (ref 11.5–15.5)
WBC: 10.5 10*3/uL (ref 4.0–10.5)
nRBC: 0 % (ref 0.0–0.2)

## 2020-10-17 LAB — COMPREHENSIVE METABOLIC PANEL
ALT: 29 U/L (ref 0–44)
AST: 35 U/L (ref 15–41)
Albumin: 2.4 g/dL — ABNORMAL LOW (ref 3.5–5.0)
Alkaline Phosphatase: 70 U/L (ref 38–126)
Anion gap: 7 (ref 5–15)
BUN: 23 mg/dL (ref 8–23)
CO2: 23 mmol/L (ref 22–32)
Calcium: 8.1 mg/dL — ABNORMAL LOW (ref 8.9–10.3)
Chloride: 106 mmol/L (ref 98–111)
Creatinine, Ser: 0.77 mg/dL (ref 0.44–1.00)
GFR, Estimated: >60 mL/min (ref >60)
Glucose, Bld: 85 mg/dL (ref 70–99)
Potassium: 4.4 mmol/L (ref 3.5–5.1)
Sodium: 136 mmol/L (ref 135–145)
Total Bilirubin: 2 mg/dL — ABNORMAL HIGH (ref 0.3–1.2)
Total Protein: 4.9 g/dL — ABNORMAL LOW (ref 6.5–8.1)

## 2020-10-17 LAB — PHOSPHORUS: Phosphorus: 3.3 mg/dL (ref 2.5–4.6)

## 2020-10-17 LAB — MAGNESIUM: Magnesium: 1.9 mg/dL (ref 1.7–2.4)

## 2020-10-17 MED ORDER — ATORVASTATIN CALCIUM 40 MG PO TABS
40.0000 mg | ORAL_TABLET | Freq: Every day | ORAL | Status: DC
  Administered 2020-10-17 – 2020-10-18 (×2): 40 mg via ORAL
  Filled 2020-10-17 (×2): qty 1

## 2020-10-17 NOTE — Progress Notes (Signed)
Physical Therapy Treatment Patient Details Name: Crystal Osborne MRN: [id number] DOB: 1931/12/18 Today's Date: 10/17/2020    History of Present Illness 87 yo admitted 8/31 with AMS and hypoxia with head CT (-). PMhx: HTN, cardiomyopathy, COPD, HLD, DM    PT Comments    Progressing well, still tentative and weak due to not enough mobility.  Emphasis on transitions, sit to stand and progression of gait stability and stamina with the RW.    Follow Up Recommendations  SNF     Equipment Recommendations  3in1 (PT)    Recommendations for Other Services       Precautions / Restrictions Precautions Precautions: Fall    Mobility  Bed Mobility Overal bed mobility: Needs Assistance Bed Mobility: Sit to Supine     Supine to sit: Min guard Sit to supine: Min guard        Transfers Overall transfer level: Needs assistance Equipment used: Rolling walker (2 wheeled) Transfers: Sit to/from Stand Sit to Stand: Min guard         General transfer comment: cues for hand placement safety  Ambulation/Gait Ambulation/Gait assistance: Min guard Gait Distance (Feet): 200 Feet Assistive device: Rolling walker (2 wheeled) Gait Pattern/deviations: Step-through pattern   Gait velocity interpretation: 1.31 - 2.62 ft/sec, indicative of limited community ambulator General Gait Details: improved steadiness, still tentative with mild weakness due to inactivity in the hospital.  Uses RW appropriately   Stairs             Wheelchair Mobility    Modified Rankin (Stroke Patients Only)       Balance     Sitting balance-Leahy Scale: Fair       Standing balance-Leahy Scale: Poor Standing balance comment: reliant on the RW at this time                            Cognition Arousal/Alertness: Awake/alert Behavior During Therapy: WFL for tasks assessed/performed Overall Cognitive Status: Within Functional Limits for tasks assessed                                         Exercises      General Comments General comments (skin integrity, edema, etc.): SpO2 on RA 95% and HR bw 93 and 103 bpm during gait.      Pertinent Vitals/Pain Pain Assessment: Faces Faces Pain Scale: No hurt Pain Intervention(s): Monitored during session    Home Living                      Prior Function            PT Goals (current goals can now be found in the care plan section) Acute Rehab PT Goals Patient Stated Goal: to go home PT Goal Formulation: With patient Time For Goal Achievement: 10/25/20 Potential to Achieve Goals: Good Progress towards PT goals: Progressing toward goals    Frequency    Min 3X/week      PT Plan Current plan remains appropriate    Co-evaluation              AM-PAC PT "6 Clicks" Mobility   Outcome Measure  Help needed turning from your back to your side while in a flat bed without using bedrails?: A Little Help needed moving from lying on your back to sitting on the side of  a flat bed without using bedrails?: A Little Help needed moving to and from a bed to a chair (including a wheelchair)?: A Little Help needed standing up from a chair using your arms (e.g., wheelchair or bedside chair)?: A Little Help needed to walk in hospital room?: A Little Help needed climbing 3-5 steps with a railing? : A Lot 6 Click Score: 17    End of Session   Activity Tolerance: Patient tolerated treatment well Patient left: in bed;with call bell/phone within reach;with bed alarm set Nurse Communication: Mobility status PT Visit Diagnosis: Other abnormalities of gait and mobility (R26.89);Difficulty in walking, not elsewhere classified (R26.2);Muscle weakness (generalized) (M62.81)     Time: [id number] PT Time Calculation (min) (ACUTE ONLY): 34 min  Charges:  $Gait Training: 8-22 mins $Therapeutic Activity: 8-22 mins                     10/17/2020  Ginger Carne., PT Acute Rehabilitation Services [phone]   (pager) [phone]  (office)   Tessie Fass Myliah Medel 10/17/2020, 6:59 PM

## 2020-10-17 NOTE — Plan of Care (Signed)

## 2020-10-17 NOTE — Progress Notes (Signed)
Pt was able to swallow medications in apple sauce without difficulty, she also was able to drink water without chocking and coughing. Also verbalized no difficulty swallowing at this time.

## 2020-10-17 NOTE — Evaluation (Signed)
Clinical/Bedside Swallow Evaluation Patient Details  Name: Crystal Osborne MRN: [id number] Date of Birth: [birth year]  Today's Date: 10/17/2020 Time: SLP Start Time (ACUTE ONLY): [id number] SLP Stop Time (ACUTE ONLY): 1206 SLP Time Calculation (min) (ACUTE ONLY): 21 min  Past Medical History:  Past Medical History:  Diagnosis Date   AAA (abdominal aortic aneurysm) [facility])    sees Dr Eden Lathe    B12 deficiency    Cancer of right breast [facility]) [place] 11/28/2008   sees Dr Oneida Alar    COPD (chronic obstructive pulmonary disease) ([place]) 01/25/2014   CVA (cerebrovascular accident) ([place]) 2006   Diabetes mellitus ([place]) 05/29/2010   A1c 6.0 on October 2011    Hyperlipidemia    Hypertension    Osteoporosis 2003   Pneumonia ~ 2014; ~ 2015   Prolapse of female bladder, acquired    Shingles    repeated episone 9/11 went to a UC   TIA (transient ischemic attack) ~ 2005   Transient hypothyroidism    d/t thyroidectomy   UTI (lower urinary tract infection) May 2013   Past Surgical History:  Past Surgical History:  Procedure Laterality Date   ABDOMINAL HYSTERECTOMY     no oophorectomy   CATARACT EXTRACTION W/ INTRAOCULAR LENS  IMPLANT, BILATERAL Bilateral    DILATION AND CURETTAGE OF UTERUS     MASTECTOMY, RADICAL Right 1960s   remotely    PERIPHERAL VASCULAR CATHETERIZATION  12/03/2015   PERIPHERAL VASCULAR CATHETERIZATION N/A 12/03/2015   Procedure: Abdominal Aortogram;  Surgeon: Lorretta Harp, MD;  Location: [place] [facility];  Service: Cardiovascular;  Laterality: N/A;   PERIPHERAL VASCULAR CATHETERIZATION Bilateral 12/03/2015   Procedure: Lower Extremity Angiography;  Surgeon: Lorretta Harp, MD;  Location: [place] [facility];  Service: Cardiovascular;  Laterality: Bilateral;   PERIPHERAL VASCULAR CATHETERIZATION Bilateral 12/03/2015   Procedure: Peripheral Vascular Intervention;  Surgeon: Lorretta Harp, MD;  Location: [place] [facility];  Service: Cardiovascular;   Laterality: Bilateral;  58mx58mm Lifestream bilateral Common Iliac Arteries   PERIPHERAL VASCULAR CATHETERIZATION Bilateral 12/03/2015   Procedure: Peripheral Vascular Atherectomy;  Surgeon: JLorretta Harp MD;  [facility] [place] [facility] [facility];  Service: Cardiovascular;  Laterality: Bilateral;  Common Iliac Arteries   THYROIDECTOMY     HPI:  Pt is an 87y.o. female who presented with confusion. CT head negative.    CXR 8/31: Small left pleural effusion with adjacent airspace disease which may reflect atelectasis or pneumonia. Pt reported to MD having difficulty swallow and being fearful of choking; pt stated at that time that she comforatbly eats mashed foods like banana, but gets scared if food has increased consistency. Discharge cancelled and SLP consulted. RN's note on 9/7 denies pt having difficulty. PMH: essential hypertension, hypertrophic cardiomyopathy, tobacco abuse, history of COPD, hyperlipidemia.   Assessment / Plan / Recommendation Clinical Impression  Pt was seen for bedside swallow evaluation and she denied a history of dysphagia PTA. She stated that since admission she has been having difficulty swallowing pills, has had globus sensation, and has been demonstrating signs of aspiration with p.o. intake. Per the pt she is most symptomatic when individuals are watching her because she becomes nervous. She stated that her symptoms are improved today, but not at baseline. [name] with absent mandibular teeth and reduced maxillary molars. Pt was seen during lunch with meal tray containing mixed fruit cocktail, spaghetti, and thin liquids. She demonstrated prolonged mastication, mild oral residue,  and inconsistent throat clearing and coughing which did not appear to be consistency-specific. A mildly wet vocal quality was noted following intake of dual consistency boluses, but was improved with pt's independent use of throat clearing. Pt's current diet  will be continued at this time. However, a modified barium swallow study is recommended to further assess physiology. SLP Visit Diagnosis: Dysphagia, unspecified (R13.10)    Aspiration Risk  Mild aspiration risk    Diet Recommendation Dysphagia 3 (Mech soft);Thin liquid (continue current diet until study is completed)   Liquid Administration via: Cup;Straw Medication Administration: Whole meds with liquid Supervision: Patient able to self feed Compensations: Slow rate;Small sips/bites    Other  Recommendations Oral Care Recommendations: Oral care BID   Follow up Recommendations Skilled Nursing facility      Frequency and Duration min 2x/week  2 weeks       Prognosis Prognosis for Safe Diet Advancement: Good      Swallow Study   General Date of Onset: 10/16/20 HPI: Pt is an 87 y.o. female who presented with confusion. CT head negative.    CXR 8/31: Small left pleural effusion with adjacent airspace disease which may reflect atelectasis or pneumonia. Pt reported to MD having difficulty swallow and being fearful of choking; pt stated at that time that she comforatbly eats mashed foods like banana, but gets scared if food has increased consistency. Discharge cancelled and SLP consulted. RN's note on 9/7 denies pt having difficulty. PMH: essential hypertension, hypertrophic cardiomyopathy, tobacco abuse, history of COPD, hyperlipidemia. Type of Study: Bedside Swallow Evaluation Previous Swallow Assessment: none Diet Prior to this Study: Dysphagia 3 (soft);Thin liquids Temperature Spikes Noted: No Respiratory Status: Room air History of Recent Intubation: No Behavior/Cognition: Alert;Cooperative;Pleasant mood Oral Cavity Assessment: Within Functional Limits Oral Care Completed by SLP: No Oral Cavity - Dentition: Missing dentition Vision: Functional for self-feeding Self-Feeding Abilities: Needs set up Patient Positioning: Upright in chair;Postural control adequate for  testing Baseline Vocal Quality: Normal Volitional Cough: Strong Volitional Swallow: Able to elicit    Oral/Motor/Sensory Function Overall Oral Motor/Sensory Function: Within functional limits   Ice Chips Ice chips: Not tested   Thin Liquid Thin Liquid: Within functional limits Presentation: Cup    Nectar Thick Nectar Thick Liquid: Not tested   Honey Thick Honey Thick Liquid: Not tested   Puree Puree: Within functional limits Presentation: Spoon   Solid     Solid: Impaired Presentation: Self Fed Oral Phase Impairments: Impaired mastication Pharyngeal Phase Impairments: Wet Vocal Quality     Riverlyn Kizziah I. Hardin Negus, [place], [place] Office number [phone] Pager Olivia Lopez de Gutierrez 10/17/2020,12:28 PM

## 2020-10-17 NOTE — Progress Notes (Signed)
PROGRESS NOTE    Crystal Osborne  [id number] DOB: 14-Feb-1931 DOA: 10/10/2020 PCP: Colon Branch, MD  Brief Narrative: Is a 87 year old Caucasian female with past medical history significant for but not admitted to essential hypertension, hypertrophic cardiomyopathy, tobacco abuse, history of COPD, hyperlipidemia as well as other comorbidities who was in her usual state of health until last few days when her patient's son noticed that she was confused.  She usually has normal cognitive function at baseline and denies any history of dementia.  Patient was quite agitated and unable to provide a subjective history during that admission.  Most of the history is obtained from the son and she has been experiencing shoulder discomfort about 2 to weeks for which she was prescribed tramadol.  She is also recently changed from lisinopril to metoprolol after seeing the cardiology clinic for her HOCM.  She also had a echocardiogram which she recently showed hypertrophic cardiomyopathy.  There is no evidence of any fevers or chills.  In the ED she was having a temperature of 97.3 and she is mildly tachycardic.  Initial saturations were 96% on 2 L.  She subsequently was noted to be more hypoxic with saturations in 80s and was placed on nonrebreather.  Chest x-ray obtained raise concern for left-sided pneumonia.  She is COVID-negative.  Initial WBC showed that she had a WBC of 12.9.  She was delirious and hospitalized for further management and improved with her acute metabolic encephalopathy.  Hypoxic respiratory failure from community-acquired pneumonia is also improved.  She is stable to be discharged to SNF yesterday however prior to discharge and after she was discharged she complained of some dysphagia so discharge was canceled.  SLP evaluating and recommending MBS which was done tomorrow.  If MBS is normal will discharge after as her dysphagia is improving.  Assessment & Plan:   Principal Problem:   CAP  (community acquired pneumonia) Active Problems:   TOBACCO ABUSE   Essential hypertension   COPD (chronic obstructive pulmonary disease) (HCC)   Acute metabolic encephalopathy   Hypertrophic cardiomyopathy (HCC)  Acute Metabolic Encephalopathy, Acute Delirium -possibly multifactorial due to medication with tramadol, infectious process.   -She has been having intermittent nightly confusion likely due to hospital stay.  -She developed a tremor on 9/4 possibly due to ceftriaxone/Haldol, medications discontinued and tremor resolved.   -Currently she is alert and oriented x4 and back to baseline  -C/w Delirium Precautions    Acute Hypoxic Respiratory Failure due to Community-Acquired Pneumonia, Sepsis ruled out -She was on nonrebreather on admission, she was placed on broad-spectrum antibiotics with ceftriaxone and azithromycin with improvement in her respiratory status.   -SpO2: 93 % O2 Flow Rate (L/min): 2 L/min -Once ceftriaxone was discontinued due to tremor as above she was switched to doxycycline and has 1 additional days left for 7-day course upon discharge.   Dysphagia -Improving; was able to swallow medications applesauce without difficulty and was also able to drink water without choking or coughing -SLP evaluating and wanting to do an MBS; if MBS is normal she can be discharged to her skilled nursing facility in the morning -Continue to Monitor   Right Shoulder Pain -Possibly osteoarthritic in nature/small trauma history reports that occasionally she bumped her right shoulder into the wall when trying to ambulate.   -She has good range of motion, no ecchymosis or fracture.   -X-ray without acute findings.   -C/w Conservative management  Recently diagnosed hypertrophic cardiomyopathy -Continue Metoprolol 12.5 mg po Daily  -  Follow up with Cardiology as an outpatient   COPD -Stable -C/w DuoNeb 3 mL Neb q4hprn Wheezing and SOB  Tobacco use -Smoking Cessation Counseling given   -smokes half a pack a day, hopefully she will quit -C/w Nicotine 14 mg TD patch q24h   Hyperlipidemia -Resume home Atorvastatin 40 mg po qHS  Hyperbilirubinemia -Mild and likely Reactive -Patient's T Bili went from 0.7 -> 2.0 -Continue to Monitor and Trend   Elevated LFTs -Possibly due to acute illness and improved -Patient's AST is now 35 and ALT is now 29 -Continue to Monitor   DVT prophylaxis: Enoxaparin 40 mg sq q24h Code Status: DO NOT RESUSCITATE  Family Communication: No family present at bedside  Disposition Plan: Pending further clinical improvement and evaluation by SLP   Status is: Inpatient  Remains inpatient appropriate because:Unsafe d/c plan, IV treatments appropriate due to intensity of illness or inability to take PO, and Inpatient level of care appropriate due to severity of illness  Dispo:  Patient From: Home  Planned Disposition: [facility]  Medically stable for discharge: No    Consultants:  None  Procedures: None  Antimicrobials:  Anti-infectives (From admission, onward)    Start     Dose/Rate Route Frequency Ordered Stop   10/16/20 0000  doxycycline (VIBRA-TABS) 100 MG tablet        100 mg Oral Every 12 hours 10/16/20 0942 10/18/20 2359   10/14/20 1015  doxycycline (VIBRA-TABS) tablet 100 mg        100 mg Oral Every 12 hours 10/14/20 0921 10/16/20 2145   10/11/20 1800  cefTRIAXone (ROCEPHIN) 2 g in sodium chloride 0.9 % 100 mL IVPB  Status:  Discontinued        2 g 200 mL/hr over 30 Minutes Intravenous Every 24 hours 10/10/20 1806 10/14/20 0921   10/11/20 1800  azithromycin (ZITHROMAX) 500 mg in sodium chloride 0.9 % 250 mL IVPB  Status:  Discontinued        500 mg 250 mL/hr over 60 Minutes Intravenous Every 24 hours 10/10/20 1806 10/14/20 0921   10/10/20 1715  cefTRIAXone (ROCEPHIN) 1 g in sodium chloride 0.9 % 100 mL IVPB        1 g 200 mL/hr over 30 Minutes Intravenous  Once 10/10/20 1713 10/10/20 1917   10/10/20 1715   azithromycin (ZITHROMAX) 500 mg in sodium chloride 0.9 % 250 mL IVPB        500 mg 250 mL/hr over 60 Minutes Intravenous  Once 10/10/20 1713 10/10/20 1917        Subjective: Seen and examined at bedside and she is doing a little bit better.  No chest pain or shortness breath.  Feels okay.  No nausea or vomiting.  No other concerns or complaints at this time.  Objective: Vitals:   10/17/20 0250 10/17/20 0400 10/17/20 0735 10/17/20 1140  BP: (!) 152/64 (!) 169/76 (!) 147/79 (!) 147/79  Pulse: 73 62    Resp: 20 18    Temp:  97.6 F (36.4 C)  98.2 F (36.8 C)  TempSrc:  Oral  Oral  SpO2: 94% 93%      Intake/Output Summary (Last 24 hours) at 10/17/2020 1451 Last data filed at 10/17/2020 1300 Gross per 24 hour  Intake 480 ml  Output 350 ml  Net 130 ml   There were no vitals filed for this visit.  Examination: Physical Exam:  Constitutional: WN/WD elderly Caucasian female currently in NAD and appears calm and comfortable Eyes: Lids and  conjunctivae normal, sclerae anicteric  ENMT: External Ears, Nose appear normal.  Slightly hard of hearing Neck: Appears normal, supple, no cervical masses, normal ROM, no appreciable thyromegaly; no JVD Respiratory: Diminished to auscultation bilaterally with coarse breath sounds, no wheezing, rales, rhonchi or crackles. Normal respiratory effort and patient is not tachypenic. No accessory muscle use.  Unlabored breathing and not wearing supplemental oxygen via nasal cannula Cardiovascular: RRR, no murmurs / rubs / gallops. S1 and S2 auscultated. No extremity edema. 2+ pedal pulses. No carotid bruits.  Abdomen: Soft, non-tender, non-distended. Bowel sounds positive.  GU: Deferred. Musculoskeletal: No clubbing / cyanosis of digits/nails. No joint deformity upper and lower extremities. Skin: No rashes, lesions, ulcers on limited skin evaluation. No induration; Warm and dry.  Neurologic: CN 2-12 grossly intact with no focal deficits. Romberg sign and  cerebellar reflexes not assessed.  Psychiatric: Normal judgment and insight. Alert and oriented x 3. Normal mood and appropriate affect.   Data Reviewed: I have personally reviewed following labs and imaging studies  CBC: Recent Labs  Lab 10/10/20 1524 10/11/20 0042 10/12/20 0358 10/13/20 0209 10/14/20 0112 10/17/20 0828  WBC 12.9* 13.8* 15.2* 15.0* 13.8* 10.5  NEUTROABS 10.1*  --   --   --   --  8.6*  HGB 14.1 11.7* 13.1 13.0 12.3 13.0  HCT 43.2 35.6* 40.0 39.0 37.5 39.7  MCV 93.9 94.2 94.3 93.1 92.6 93.0  PLT 238 175 197 206 238 0000000   Basic Metabolic Panel: Recent Labs  Lab 10/11/20 0042 10/12/20 0358 10/13/20 0209 10/14/20 0112 10/17/20 0828  NA 135 134* 138 137 136  K 4.4 4.1 4.2 3.6 4.4  CL 104 102 105 107 106  CO2 '23 23 22 22 23  '$ GLUCOSE 114* 129* 91 115* 85  BUN '18 16 16 17 23  '$ CREATININE 0.94 0.92 0.84 0.77 0.77  CALCIUM 7.6* 8.3* 8.6* 8.3* 8.1*  MG  --   --   --   --  1.9  PHOS  --   --   --   --  3.3   GFR: Estimated Creatinine Clearance: 40.9 mL/min (by C-G formula based on SCr of 0.77 mg/dL). Liver Function Tests: Recent Labs  Lab 10/10/20 1524 10/11/20 0042 10/12/20 0358 10/17/20 0828  AST 28 112* 58* 35  ALT 30 120* 110* 29  ALKPHOS 76 112 97 70  BILITOT 1.4* 0.9 0.7 2.0*  PROT 6.1* 4.6* 5.5* 4.9*  ALBUMIN 3.3* 2.4* 2.8* 2.4*   No results for input(s): LIPASE, AMYLASE in the last 168 hours. No results for input(s): AMMONIA in the last 168 hours. Coagulation Profile: No results for input(s): INR, PROTIME in the last 168 hours. Cardiac Enzymes: No results for input(s): CKTOTAL, CKMB, CKMBINDEX, TROPONINI in the last 168 hours. BNP (last 3 results) No results for input(s): PROBNP in the last 8760 hours. HbA1C: No results for input(s): HGBA1C in the last 72 hours. CBG: Recent Labs  Lab 10/11/20 0010  GLUCAP 106*   Lipid Profile: No results for input(s): CHOL, HDL, LDLCALC, TRIG, CHOLHDL, LDLDIRECT in the last 72 hours. Thyroid  Function Tests: No results for input(s): TSH, T4TOTAL, FREET4, T3FREE, THYROIDAB in the last 72 hours. Anemia Panel: No results for input(s): VITAMINB12, FOLATE, FERRITIN, TIBC, IRON, RETICCTPCT in the last 72 hours. Sepsis Labs: Recent Labs  Lab 10/10/20 1525 10/10/20 1725 10/11/20 0042 10/12/20 0358  PROCALCITON  --   --  0.57 0.57  LATICACIDVEN 1.8 3.3* 1.5  --     Recent Results (from  the past 240 hour(s))  Culture, blood (routine x 2)     Status: None   Collection Time: 10/10/20  3:00 PM   Specimen: BLOOD  Result Value Ref Range Status   Specimen Description BLOOD RIGHT ANTECUBITAL  Final   Special Requests   Final    BOTTLES DRAWN AEROBIC AND ANAEROBIC Blood Culture adequate volume   Culture   Final    NO GROWTH 5 DAYS Performed at [facility], [address] [place], [place] [address]    Report Status 10/15/2020 FINAL  Final  Resp Panel by RT-PCR (Flu A&B, Covid) Nasopharyngeal Swab     Status: None   Collection Time: 10/10/20  3:29 PM   Specimen: Nasopharyngeal Swab; Nasopharyngeal(NP) swabs in vial transport medium  Result Value Ref Range Status   SARS Coronavirus 2 by RT PCR NEGATIVE NEGATIVE Final    Comment: (NOTE) SARS-CoV-2 target nucleic acids are NOT DETECTED.  The SARS-CoV-2 RNA is generally detectable in upper respiratory specimens during the acute phase of infection. The lowest concentration of SARS-CoV-2 viral copies this assay can detect is 138 copies/mL. A negative result does not preclude SARS-Cov-2 infection and should not be used as the sole basis for treatment or other patient management decisions. A negative result may occur with  improper specimen collection/handling, submission of specimen other than nasopharyngeal swab, presence of viral mutation(s) within the areas targeted by this assay, and inadequate number of viral copies(<138 copies/mL). A negative result must be combined with clinical observations, patient history, and  epidemiological information. The expected result is Negative.  Fact Sheet for Patients:  EntrepreneurPulse.com.au  Fact Sheet for Healthcare Providers:  IncredibleEmployment.be  This test is no t yet approved or cleared by the [place] FDA and  has been authorized for detection and/or diagnosis of SARS-CoV-2 by FDA under an Emergency Use Authorization (EUA). This EUA will remain  in effect (meaning this test can be used) for the duration of the COVID-19 declaration under Section 564(b)(1) of the Act, 21 U.S.C.section 360bbb-3(b)(1), unless the authorization is terminated  or revoked sooner.       Influenza A by PCR NEGATIVE NEGATIVE Final   Influenza B by PCR NEGATIVE NEGATIVE Final    Comment: (NOTE) The Xpert Xpress SARS-CoV-2/FLU/RSV plus assay is intended as an aid in the diagnosis of influenza from Nasopharyngeal swab specimens and should not be used as a sole basis for treatment. Nasal washings and aspirates are unacceptable for Xpert Xpress SARS-CoV-2/FLU/RSV testing.  Fact Sheet for Patients: EntrepreneurPulse.com.au  Fact Sheet for Healthcare Providers: IncredibleEmployment.be  This test is not yet approved or cleared by the [place] FDA and has been authorized for detection and/or diagnosis of SARS-CoV-2 by FDA under an Emergency Use Authorization (EUA). This EUA will remain in effect (meaning this test can be used) for the duration of the COVID-19 declaration under Section 564(b)(1) of the Act, 21 U.S.C. section 360bbb-3(b)(1), unless the authorization is terminated or revoked.  Performed at [facility], [place] [address] [place], [place] [address]   Culture, blood (routine x 2)     Status: None   Collection Time: 10/10/20  4:06 PM   Specimen: BLOOD RIGHT WRIST  Result Value Ref Range Status   Specimen Description BLOOD RIGHT WRIST  Final   Special Requests   Final     BOTTLES DRAWN AEROBIC AND ANAEROBIC Blood Culture results may not be optimal due to an inadequate volume of blood received in culture bottles  Culture   Final    NO GROWTH 5 DAYS Performed at [facility], [place] [address], [place], [place] [address]    Report Status 10/15/2020 FINAL  Final  MRSA Next Gen by PCR, Nasal     Status: None   Collection Time: 10/11/20  4:52 AM   Specimen: Nasal Mucosa; Nasal Swab  Result Value Ref Range Status   MRSA by PCR Next Gen NOT DETECTED NOT DETECTED Final    Comment: (NOTE) The GeneXpert MRSA Assay (FDA approved for NASAL specimens only), is one component of a comprehensive MRSA colonization surveillance program. It is not intended to diagnose MRSA infection nor to guide or monitor treatment for MRSA infections. Test performance is not FDA approved in patients less than 54 years old. Performed at [facility], [place] [address], [place], [place] [address]   Resp Panel by RT-PCR (Flu A&B, Covid) Nasopharyngeal Swab     Status: None   Collection Time: 10/15/20  3:54 PM   Specimen: Nasopharyngeal Swab; Nasopharyngeal(NP) swabs in vial transport medium  Result Value Ref Range Status   SARS Coronavirus 2 by RT PCR NEGATIVE NEGATIVE Final    Comment: (NOTE) SARS-CoV-2 target nucleic acids are NOT DETECTED.  The SARS-CoV-2 RNA is generally detectable in upper respiratory specimens during the acute phase of infection. The lowest concentration of SARS-CoV-2 viral copies this assay can detect is 138 copies/mL. A negative result does not preclude SARS-Cov-2 infection and should not be used as the sole basis for treatment or other patient management decisions. A negative result may occur with  improper specimen collection/handling, submission of specimen other than nasopharyngeal swab, presence of viral mutation(s) within the areas targeted by this assay, and inadequate number of viral copies(<138 copies/mL). A negative result must be combined  with clinical observations, patient history, and epidemiological information. The expected result is Negative.  Fact Sheet for Patients:  EntrepreneurPulse.com.au  Fact Sheet for Healthcare Providers:  IncredibleEmployment.be  This test is no t yet approved or cleared by the [place] FDA and  has been authorized for detection and/or diagnosis of SARS-CoV-2 by FDA under an Emergency Use Authorization (EUA). This EUA will remain  in effect (meaning this test can be used) for the duration of the COVID-19 declaration under Section 564(b)(1) of the Act, 21 U.S.C.section 360bbb-3(b)(1), unless the authorization is terminated  or revoked sooner.       Influenza A by PCR NEGATIVE NEGATIVE Final   Influenza B by PCR NEGATIVE NEGATIVE Final    Comment: (NOTE) The Xpert Xpress SARS-CoV-2/FLU/RSV plus assay is intended as an aid in the diagnosis of influenza from Nasopharyngeal swab specimens and should not be used as a sole basis for treatment. Nasal washings and aspirates are unacceptable for Xpert Xpress SARS-CoV-2/FLU/RSV testing.  Fact Sheet for Patients: EntrepreneurPulse.com.au  Fact Sheet for Healthcare Providers: IncredibleEmployment.be  This test is not yet approved or cleared by the [place] FDA and has been authorized for detection and/or diagnosis of SARS-CoV-2 by FDA under an Emergency Use Authorization (EUA). This EUA will remain in effect (meaning this test can be used) for the duration of the COVID-19 declaration under Section 564(b)(1) of the Act, 21 U.S.C. section 360bbb-3(b)(1), unless the authorization is terminated or revoked.  Performed at [facility], [place] [address] [place],  [address]     RN Pressure Injury Documentation:     Estimated body mass index is 19.2 kg/m as calculated from the following:   Height  as of 10/08/20: '5\' 5"'$  (1.651 m).   Weight as of  10/08/20: 52.3 kg.  Malnutrition Type:   Malnutrition Characteristics:   Nutrition Interventions:    Radiology Studies: No results found.  Scheduled Meds:  enoxaparin (LOVENOX) injection  40 mg Subcutaneous Q24H   mouth rinse  15 mL Mouth Rinse BID   metoprolol succinate  12.5 mg Oral Daily   nicotine  14 mg Transdermal Q24H   thiamine  100 mg Oral Daily   Continuous Infusions:   LOS: 7 days   Kerney Elbe, DO [facility] PAGER is on AMION  If 7PM-7AM, please contact night-coverage [email or web address]

## 2020-10-17 NOTE — Progress Notes (Signed)
Occupational Therapy Treatment Patient Details Name: Crystal Osborne MRN: [id number] DOB: 02/10/1932 Today's Date: 10/17/2020    History of present illness 87 yo admitted 8/31 with AMS and hypoxia with head CT (-). PMhx: HTN, cardiomyopathy, COPD, HLD, DM   OT comments  Patient in bed and eager to participate.  Skilled OT performed to address bed mobility, transfers, and grooming. Patient required extra time and vcs for rail use to get to EOB and transferred to recliner with rw and min guard. Patient was setup and vcs for grooming seated in chair.  Patient was setup for eating but stated she was not hungry.  Acute OT to continue to follow.   Follow Up Recommendations  SNF    Equipment Recommendations  None recommended by OT    Recommendations for Other Services      Precautions / Restrictions Precautions Precautions: Fall Precaution Comments: 1 fall last week.       Mobility Bed Mobility Overal bed mobility: Needs Assistance Bed Mobility: Supine to Sit     Supine to sit: HOB elevated;Min guard     General bed mobility comments: vcs to use rails    Transfers Overall transfer level: Needs assistance Equipment used: Rolling walker (2 wheeled) Transfers: Sit to/from Stand Sit to Stand: Min guard         General transfer comment: Verbal cues for hand placement. Min guard for safety and performed with increased time.    Balance Overall balance assessment: Needs assistance Sitting-balance support: No upper extremity supported;Feet supported Sitting balance-Leahy Scale: Fair Sitting balance - Comments: sat on eob without UE support   Standing balance support: Bilateral upper extremity supported;During functional activity Standing balance-Leahy Scale: Poor Standing balance comment: BUE on RW                           ADL either performed or assessed with clinical judgement   ADL Overall ADL's : Needs assistance/impaired Eating/Feeding: Set  up;Sitting Eating/Feeding Details (indicate cue type and reason): Patient states she hasn't an appetite Grooming: Wash/dry hands;Wash/dry face;Oral care;Brushing hair;Sitting;Set up Grooming Details (indicate cue type and reason): Patient asked to perform seated                             Functional mobility during ADLs: Min guard;Minimal assistance;Rolling walker General ADL Comments: grooming performed seated in recliner     Vision       Perception     Praxis      Cognition Arousal/Alertness: Awake/alert Behavior During Therapy: WFL for tasks assessed/performed Overall Cognitive Status: Within Functional Limits for tasks assessed Area of Impairment: Safety/judgement                 Orientation Level: Person;Place;Time;Situation       Safety/Judgement: Decreased awareness of safety     General Comments: Patient demonstrated good orientation        Exercises     Shoulder Instructions       General Comments      Pertinent Vitals/ Pain       Pain Assessment: No/denies pain  Home Living                                          Prior Functioning/Environment  Frequency  Min 2X/week        Progress Toward Goals  OT Goals(current goals can now be found in the care plan section)  Progress towards OT goals: Progressing toward goals  Acute Rehab OT Goals Patient Stated Goal: to go home OT Goal Formulation: With patient Time For Goal Achievement: 10/25/20 Potential to Achieve Goals: Good ADL Goals Pt Will Perform Grooming: with modified independence;standing Pt Will Perform Upper Body Bathing: with modified independence;sitting Pt Will Perform Lower Body Bathing: with modified independence;sit to/from stand Pt Will Perform Upper Body Dressing: with modified independence;sitting Pt Will Perform Lower Body Dressing: with modified independence;sit to/from stand Pt Will Transfer to Toilet: with modified  independence;stand pivot transfer;ambulating Pt Will Perform Toileting - Clothing Manipulation and hygiene: with modified independence;sit to/from stand  Plan Discharge plan remains appropriate    Co-evaluation                 AM-PAC OT "6 Clicks" Daily Activity     Outcome Measure   Help from another person eating meals?: A Little Help from another person taking care of personal grooming?: A Little Help from another person toileting, which includes using toliet, bedpan, or urinal?: A Lot Help from another person bathing (including washing, rinsing, drying)?: A Lot Help from another person to put on and taking off regular upper body clothing?: A Lot Help from another person to put on and taking off regular lower body clothing?: A Lot 6 Click Score: 14    End of Session Equipment Utilized During Treatment: Rolling walker;Gait belt  OT Visit Diagnosis: Unsteadiness on feet (R26.81)   Activity Tolerance Patient tolerated treatment well   Patient Left in chair;with call bell/phone within reach;with chair alarm set   Nurse Communication Mobility status        Time: [id number] OT Time Calculation (min): 23 min  Charges: OT General Charges $OT Visit: 1 Visit OT Treatments $Self Care/Home Management : 23-37 mins  Lodema Hong, [place] 10/17/2020, 11:00 AM

## 2020-10-18 ENCOUNTER — Inpatient Hospital Stay (HOSPITAL_COMMUNITY): Payer: Medicare Other

## 2020-10-18 LAB — CBC WITH DIFFERENTIAL/PLATELET
Abs Immature Granulocytes: 0.02 10*3/uL (ref 0.00–0.07)
Basophils Absolute: 0 10*3/uL (ref 0.0–0.1)
Basophils Relative: 0 %
Eosinophils Absolute: 0 10*3/uL (ref 0.0–0.5)
Eosinophils Relative: 0 %
HCT: 40.1 % (ref 36.0–46.0)
Hemoglobin: 13.1 g/dL (ref 12.0–15.0)
Immature Granulocytes: 0 %
Lymphocytes Relative: 14 %
Lymphs Abs: 1.4 10*3/uL (ref 0.7–4.0)
MCH: 30.6 pg (ref 26.0–34.0)
MCHC: 32.7 g/dL (ref 30.0–36.0)
MCV: 93.7 fL (ref 80.0–100.0)
Monocytes Absolute: 0.6 10*3/uL (ref 0.1–1.0)
Monocytes Relative: 6 %
Neutro Abs: 7.3 10*3/uL (ref 1.7–7.7)
Neutrophils Relative %: 80 %
Platelets: 251 10*3/uL (ref 150–400)
RBC: 4.28 MIL/uL (ref 3.87–5.11)
RDW: 14.8 % (ref 11.5–15.5)
WBC: 9.4 10*3/uL (ref 4.0–10.5)
nRBC: 0 % (ref 0.0–0.2)

## 2020-10-18 LAB — COMPREHENSIVE METABOLIC PANEL
ALT: 25 U/L (ref 0–44)
AST: 20 U/L (ref 15–41)
Albumin: 2.5 g/dL — ABNORMAL LOW (ref 3.5–5.0)
Alkaline Phosphatase: 65 U/L (ref 38–126)
Anion gap: 10 (ref 5–15)
BUN: 27 mg/dL — ABNORMAL HIGH (ref 8–23)
CO2: 21 mmol/L — ABNORMAL LOW (ref 22–32)
Calcium: 8.3 mg/dL — ABNORMAL LOW (ref 8.9–10.3)
Chloride: 107 mmol/L (ref 98–111)
Creatinine, Ser: 0.79 mg/dL (ref 0.44–1.00)
GFR, Estimated: >60 mL/min (ref >60)
Glucose, Bld: 79 mg/dL (ref 70–99)
Potassium: 3.8 mmol/L (ref 3.5–5.1)
Sodium: 138 mmol/L (ref 135–145)
Total Bilirubin: 1.1 mg/dL (ref 0.3–1.2)
Total Protein: 5 g/dL — ABNORMAL LOW (ref 6.5–8.1)

## 2020-10-18 LAB — RESP PANEL BY RT-PCR (FLU A&B, COVID) ARPGX2
Influenza A by PCR: NEGATIVE
Influenza B by PCR: NEGATIVE
SARS Coronavirus 2 by RT PCR: NEGATIVE

## 2020-10-18 LAB — MAGNESIUM: Magnesium: 1.8 mg/dL (ref 1.7–2.4)

## 2020-10-18 LAB — PHOSPHORUS: Phosphorus: 3.5 mg/dL (ref 2.5–4.6)

## 2020-10-18 MED ORDER — MAGNESIUM SULFATE 2 GM/50ML IV SOLN
2.0000 g | Freq: Once | INTRAVENOUS | Status: AC
  Administered 2020-10-18: 2 g via INTRAVENOUS
  Filled 2020-10-18: qty 50

## 2020-10-18 NOTE — TOC Progression Note (Signed)
Transition of Care [facility]) - Progression Note    Patient Details  Name: DANYELL KROLAK MRN: [id number] Date of Birth: 05-12-1931  [facility]) CM/SW Contact  Joanne Chars, LCSW Phone Number: 10/18/2020, 3:54 PM  Clinical Narrative:  CSW has called [employer] multiple times asking about Josem Kaufmann, was told around 1400 that they have all the documents and will call back shortly.  CSW called again at 1530 and LM.  No response from [place].  Josem Kaufmann is now showing "void" in [employer] portal.  Unclear what the hold up is.      Expected Discharge Plan: [place] Barriers to Discharge: Continued Medical Work up, SNF Pending bed offer  Expected Discharge Plan and Services Expected Discharge Plan: [place] Choice: [place] arrangements for the past 2 months: Single Family Home Expected Discharge Date: 10/18/20                                     Social Determinants of Health (SDOH) Interventions    Readmission Risk Interventions No flowsheet data found.

## 2020-10-18 NOTE — Plan of Care (Signed)

## 2020-10-18 NOTE — Progress Notes (Addendum)
Modified Barium Swallow Progress Note  Patient Details  Name: Crystal Osborne MRN: [id number] Date of Birth: [birth year]  Today's Date: 10/18/2020  Modified Barium Swallow completed.  Full report located under Chart Review in the Imaging Section.  Brief recommendations include the following:  Clinical Impression  Pt presents with oropharyngeal dysphagia characterized by impaired bolus cohesion, a pharyngeal delay, reduced pharyngeal constriction, and reduced anterior laryngeal movement. She demonstrated premature spillage to the pyriform sinuses, posterior pharyngeal wall residue, and mild pyriform sinus residue. Penetration (PAS 2) was noted with consecutive swallows of nectar thick liquids and aspiration (PAS 7) was observed once when a pill was taken with nectar thick liquids via straw. Penetration (PAS 3,5) and silent aspiration (PAS 8) were noted with thin liquids via cup. Laryngeal invasion was secondary to the pharyngeal delay and only larger amounts of aspiration triggered coughing, but this did not facilitate expulsion of the aspirate. A chin tuck posture and effortful swallows were ineffective in eliminating laryngeal invasion, but an effortful swallow did improve pharyngeal clearance. A dysphagia 3 diet with nectar thick liquids is recommended at this time. SLP will follow for treatment.   Swallow Evaluation Recommendations       SLP Diet Recommendations: Dysphagia 3 (Mech soft) solids;Nectar thick liquid   Liquid Administration via: Cup;No straw   Medication Administration: Whole meds with puree   Supervision: Intermittent supervision to cue for compensatory strategies   Compensations: Slow rate;Small sips/bites   Postural Changes: Seated upright at 90 degrees   Oral Care Recommendations: Oral care BID      Lawrance Wiedemann I. Hardin Negus, [place], [place] Office number [phone] Pager [place] 10/18/2020,10:59 AM

## 2020-10-18 NOTE — Progress Notes (Signed)
Physical Therapy Treatment Patient Details Name: Crystal Osborne MRN: [id number] DOB: August 13, 1931 Today's Date: 10/18/2020    History of Present Illness 87 yo admitted 8/31 with AMS and hypoxia with head CT (-). Pt with CAP. PMhx: HTN, cardiomyopathy, COPD, HLD, DM    PT Comments    Pt tolerated treatment well, but was more fatigued compared to previous session. Pt had greater difficulty with bed mobility and stepping backward today, requiring min A and increased cueing, respectively.  Continue to recommend SNF with supervision for mobility given pt's performance during session and need for assist. Pt agreeable to recommendation.    Follow Up Recommendations  SNF;Supervision for mobility/OOB     Equipment Recommendations  3in1 (PT)    Recommendations for Other Services       Precautions / Restrictions Precautions Precautions: Fall Restrictions Weight Bearing Restrictions: No    Mobility  Bed Mobility   Bed Mobility: Supine to Sit     Supine to sit: Min assist;HOB elevated     General bed mobility comments: HOB 30 degrees with min assist and increased time to lift trunk from surface    Transfers Overall transfer level: Needs assistance   Transfers: Sit to/from Stand Sit to Stand: Min assist         General transfer comment: cues for hand placement, assist to rise  Ambulation/Gait Ambulation/Gait assistance: Min guard Gait Distance (Feet): 200 Feet Assistive device: Rolling walker (2 wheeled) Gait Pattern/deviations: Step-through pattern;Decreased stride length;Trunk flexed   Gait velocity interpretation: 1.31 - 2.62 ft/sec, indicative of limited community ambulator General Gait Details: guarding with gait with slight unsteadiness and flexed trunk with cues for posture, position in RW and breathing technique. SpO2 94% on RA. Increased shakiness with turning to sit with min assist   Stairs             Wheelchair Mobility    Modified Rankin (Stroke  Patients Only)       Balance Overall balance assessment: Needs assistance   Sitting balance-Leahy Scale: Fair Sitting balance - Comments: EOB without UE support   Standing balance support: Bilateral upper extremity supported;During functional activity   Standing balance comment: reliant on the RW in standing                            Cognition Arousal/Alertness: Awake/alert Behavior During Therapy: WFL for tasks assessed/performed Overall Cognitive Status: Within Functional Limits for tasks assessed                                        Exercises      General Comments        Pertinent Vitals/Pain Pain Assessment: 0-10 Pain Score: 8  Pain Location: left foot Pain Descriptors / Indicators: Aching Pain Intervention(s): Limited activity within patient's tolerance;Monitored during session    Home Living                      Prior Function            PT Goals (current goals can now be found in the care plan section) Progress towards PT goals: Progressing toward goals    Frequency    Min 3X/week      PT Plan Current plan remains appropriate    Co-evaluation  AM-PAC PT "6 Clicks" Mobility   Outcome Measure  Help needed turning from your back to your side while in a flat bed without using bedrails?: A Little Help needed moving from lying on your back to sitting on the side of a flat bed without using bedrails?: A Little Help needed moving to and from a bed to a chair (including a wheelchair)?: A Little Help needed standing up from a chair using your arms (e.g., wheelchair or bedside chair)?: A Little Help needed to walk in hospital room?: A Little Help needed climbing 3-5 steps with a railing? : A Lot 6 Click Score: 17    End of Session Equipment Utilized During Treatment: Gait belt Activity Tolerance: Patient tolerated treatment well Patient left: in chair;with call bell/phone within reach;with  chair alarm set Nurse Communication: Mobility status PT Visit Diagnosis: Other abnormalities of gait and mobility (R26.89);Difficulty in walking, not elsewhere classified (R26.2);Muscle weakness (generalized) (M62.81)     Time: [id number] PT Time Calculation (min) (ACUTE ONLY): 19 min  Charges:  $Gait Training: 8-22 mins                     Louie Casa, SPT Acute Rehab: [phone]     Domingo Dimes 10/18/2020, 9:48 AM

## 2020-10-18 NOTE — Discharge Summary (Signed)
Physician Discharge Summary  Crystal Osborne [id number] DOB: 21-Jun-1931 DOA: 10/10/2020  PCP: Colon Branch, MD  Admit date: 10/10/2020 Discharge date: 10/18/2020  Admitted From: Home Disposition: SNF  Recommendations for Outpatient Follow-up:  Follow up with PCP in 1-2 weeks Continue Dysphagia 3 Diet with Nectar Thick Liquids Please obtain CMP/CBC, Mag, Phos in one week Please follow up on the following pending results:  Home Health: No Equipment/Devices: None  Discharge Condition: Stable CODE STATUS: DO NOT RESUSCITATE  Diet recommendation: Dysphagia 3 Diet with Nectar Thick Liquids   Brief/Interim Summary: The patient is a 87 year old Caucasian female with past medical history significant for but not admitted to essential hypertension, hypertrophic cardiomyopathy, tobacco abuse, history of COPD, hyperlipidemia as well as other comorbidities who was in her usual state of health until last few days when her patient's son noticed that she was confused.  She usually has normal cognitive function at baseline and denies any history of dementia.  Patient was quite agitated and unable to provide a subjective history during that admission.  Most of the history is obtained from the son and she has been experiencing shoulder discomfort about 2 to weeks for which she was prescribed tramadol.  She is also recently changed from lisinopril to metoprolol after seeing the cardiology clinic for her HOCM.  She also had a echocardiogram which she recently showed hypertrophic cardiomyopathy.  There is no evidence of any fevers or chills.  In the ED she was having a temperature of 97.3 and she is mildly tachycardic.  Initial saturations were 96% on 2 L.  She subsequently was noted to be more hypoxic with saturations in 80s and was placed on nonrebreather.  Chest x-ray obtained raise concern for left-sided pneumonia.  She is COVID-negative.  Initial WBC showed that she had a WBC of 12.9.  She was delirious and  hospitalized for further management and improved with her acute metabolic encephalopathy.  Hypoxic respiratory failure from community-acquired pneumonia is also improved.  She is stable to be discharged to SNF the day before yesterday however prior to discharge she complained of some dysphagia so discharge was canceled.  SLP evaluating and recommending MBS which was 10/18/20 SLP now recommends dysphagia 3 diet with nectar thick liquids.  She is deemed medically stable to be discharged at this time and continue diet advancement and repeat speech evaluation at SNF.  Discharge Diagnoses:  Principal Problem:   CAP (community acquired pneumonia) Active Problems:   TOBACCO ABUSE   Essential hypertension   COPD (chronic obstructive pulmonary disease) (HCC)   Acute metabolic encephalopathy   Hypertrophic cardiomyopathy (HCC)  Acute Metabolic Encephalopathy, Acute Delirium -possibly multifactorial due to medication with tramadol, infectious process.   -She has been having intermittent nightly confusion likely due to hospital stay.  -She developed a tremor on 9/4 possibly due to ceftriaxone/Haldol, medications discontinued and tremor resolved.   -Currently she is alert and oriented x4 and back to baseline  -C/w Delirium Precautions    Acute Hypoxic Respiratory Failure due to Community-Acquired Pneumonia, Sepsis ruled out -She was on nonrebreather on admission, she was placed on broad-spectrum antibiotics with ceftriaxone and azithromycin with improvement in her respiratory status.   -SpO2: 94 % O2 Flow Rate (L/min): 2 L/min; No longer on Supplemental O2  -Once ceftriaxone was discontinued due to tremor as above she was switched to doxycycline and has 1 additional days left for 7-day course upon discharge.    Dysphagia -Improving; was able to swallow medications applesauce without difficulty  and was also able to drink water without choking or coughing -SLP evaluating and wanting to do an MBS; MBS done  and SLP recommending Dysphagia 3 Diet with Nectar Thick Liquids -Continue to Monitor in the outpaitent setting and have SLP evaluate at SNF for further diet advancement    Right Shoulder Pain -Possibly osteoarthritic in nature/small trauma history reports that occasionally she bumped her right shoulder into the wall when trying to ambulate.   -She has good range of motion, no ecchymosis or fracture.   -X-ray without acute findings.   -C/w Conservative management   Recently diagnosed hypertrophic cardiomyopathy -Continue Metoprolol 12.5 mg po Daily  -Follow up with Cardiology as an outpatient   COPD -Stable -C/w DuoNeb 3 mL Neb q4hprn Wheezing and SOB while hospitalized    Tobacco use -Smoking Cessation Counseling given  -smokes half a pack a day, hopefully she will quit -C/w Nicotine 14 mg TD patch q24h   Hyperlipidemia -Resume home Atorvastatin 40 mg po qHS   Hyperbilirubinemia -Mild and likely Reactive -Patient's T Bili went from 0.7 -> 2.0 -> 1.1 -Continue to Monitor and Trend    Elevated LFTs -Possibly due to acute illness and improved -Patient's AST is now 20 and ALT is now 25 -Continue to Monitor and Trend in the outpatient setting    Discharge Instructions  Discharge Instructions     Call MD for:  difficulty breathing, headache or visual disturbances   Complete by: As directed    Call MD for:  extreme fatigue   Complete by: As directed    Call MD for:  hives   Complete by: As directed    Call MD for:  persistant dizziness or light-headedness   Complete by: As directed    Call MD for:  persistant nausea and vomiting   Complete by: As directed    Call MD for:  redness, tenderness, or signs of infection (pain, swelling, redness, odor or green/yellow discharge around incision site)   Complete by: As directed    Call MD for:  severe uncontrolled pain   Complete by: As directed    Call MD for:  temperature >100.4   Complete by: As directed    Diet - low sodium  heart healthy   Complete by: As directed    Dysphagia 3 Diet with Nectar Thick Liquids   Discharge instructions   Complete by: As directed    You were cared for by a hospitalist during your hospital stay. If you have any questions about your discharge medications or the care you received while you were in the hospital after you are discharged, you can call the unit and ask to speak with the hospitalist on call if the hospitalist that took care of you is not available. Once you are discharged, your primary care physician will handle any further medical issues. Please note that NO REFILLS for any discharge medications will be authorized once you are discharged, as it is imperative that you return to your primary care physician (or establish a relationship with a primary care physician if you do not have one) for your aftercare needs so that they can reassess your need for medications and monitor your lab values.  Follow up with PCP and Cardiology as an outpatient. Take all medications as prescribed. If symptoms change or worsen please return to the ED for evaluation   Increase activity slowly   Complete by: As directed       Allergies as of 10/18/2020  Reactions   Septra [sulfamethoxazole-trimethoprim] Itching   Pt states she had a severe allergic reaction; she was in the ICU for 2 days because of taking this.   Ciprofloxacin Hcl Itching   Codeine Rash   Made her feel very strange        Medication List     TAKE these medications    acetaminophen 500 MG tablet Commonly known as: TYLENOL Take 1,000 mg by mouth 2 (two) times daily as needed (for pain.).   albuterol 108 (90 Base) MCG/ACT inhaler Commonly known as: VENTOLIN HFA Inhale 2 puffs into the lungs every 6 (six) hours as needed for wheezing or shortness of breath.   ALPRAZolam 0.25 MG tablet Commonly known as: XANAX Take 1-2 tablets (0.25-0.5 mg total) by mouth at bedtime as needed for sleep.   AMBULATORY NON FORMULARY  MEDICATION Right Breast Prosthesis  Dx: [id number]   aspirin EC 81 MG tablet Take 81 mg by mouth every evening.   atorvastatin 40 MG tablet Commonly known as: LIPITOR Take 1 tablet (40 mg total) by mouth daily. What changed: when to take this   doxycycline 100 MG tablet Commonly known as: VIBRA-TABS Take 1 tablet (100 mg total) by mouth every 12 (twelve) hours for 2 days.   metoprolol succinate 25 MG 24 hr tablet Commonly known as: Toprol XL Take 0.5 tablets (12.5 mg total) by mouth daily.   POTASSIUM PO Take 1 tablet by mouth daily. OTC   psyllium 28 % packet Commonly known as: METAMUCIL SMOOTH TEXTURE Take 1 packet by mouth 2 (two) times daily as needed (constipation).        Contact information for after-discharge care     Destination     [facility] Preferred SNF .   Service: Skilled Nursing Contact information: [id number] [address] [phone]                    Allergies  Allergen Reactions   Septra [Sulfamethoxazole-Trimethoprim] Itching    Pt states she had a severe allergic reaction; she was in the ICU for 2 days because of taking this.   Ciprofloxacin Hcl Itching   Codeine Rash    Made her feel very strange    Consultations: SLP  Procedures/Studies: DG Chest 1 View  Result Date: 10/10/2020 CLINICAL DATA:  Altered mental status, shortness of breath EXAM: CHEST  1 VIEW COMPARISON:  Chest radiograph 11/09/2015 FINDINGS: The heart is enlarged. The mediastinal contours are within normal limits. There is calcified atherosclerotic plaque of the aortic arch. There are coarsened interstitial markings throughout both lungs, likely chronic. There is a small left pleural effusion with adjacent airspace disease. The right costophrenic angle is cut off. There is no overt pulmonary edema. There is no pneumothorax. There is no acute osseous abnormality. IMPRESSION: 1. Small left pleural effusion with adjacent  airspace disease which may reflect atelectasis or pneumonia. 2. Mild cardiomegaly. Electronically Signed   By: Valetta Mole M.D.   On: 10/10/2020 16:01   CT Head Wo Contrast  Result Date: 10/10/2020 CLINICAL DATA:  Mental status change EXAM: CT HEAD WITHOUT CONTRAST TECHNIQUE: Contiguous axial images were obtained from the base of the skull through the vertex without intravenous contrast. COMPARISON:  CT 07/20/2016, MRI 09/22/2020 FINDINGS: Brain: Motion degradation. No acute territorial infarction, hemorrhage or intracranial mass. Moderate severe chronic small vessel ischemic changes of the white matter. Chronic lacunar infarct within the left white matter and basal ganglia. Chronic left  cerebellar infarct. Stable ventricle size. Atrophy Vascular: No hyperdense vessels. Carotid vascular and vertebral calcification Skull: Normal. Negative for fracture or focal lesion. Sinuses/Orbits: No acute finding. Other: None IMPRESSION: 1. Motion degraded study. 2. No definite CT evidence for acute intracranial abnormality. Atrophy and chronic small vessel ischemic changes of the white matter. Electronically Signed   By: Donavan Foil M.D.   On: 10/10/2020 19:42   MR Brain Wo Contrast  Result Date: 09/23/2020 CLINICAL DATA:  Dizziness, persistent/recurrent, cardiac or vascular cause suspected. Ataxia 2-3 months. EXAM: MRI HEAD WITHOUT CONTRAST TECHNIQUE: Multiplanar, multiecho pulse sequences of the brain and surrounding structures were obtained without intravenous contrast. COMPARISON:  Head CT 07/20/2016.  MRI 11/16/2005. FINDINGS: Brain: Diffusion imaging does not show any acute or subacute infarction. No focal abnormality affects the brainstem. Old small vessel infarction within the left cerebellum. Cerebral hemispheres show moderate chronic small-vessel ischemic changes throughout the white matter. No cortical or large vessel territory infarction. No mass lesion, hemorrhage, hydrocephalus or extra-axial collection.  Findings are progressive since 2007 as expected. Vascular: Major vessels at the base of the brain show flow. Skull and upper cervical spine: Negative Sinuses/Orbits: Clear/normal Other: None IMPRESSION: No acute or reversible finding. Old left cerebellar small vessel infarction. Moderate chronic small-vessel ischemic changes of the cerebral hemispheric white matter, progressive since 2007. Electronically Signed   By: Nelson Chimes M.D.   On: 09/23/2020 15:35   DG CHEST PORT 1 VIEW  Result Date: 10/18/2020 CLINICAL DATA:  SOB (shortness of breath) hx copd EXAM: PORTABLE CHEST - 1 VIEW COMPARISON:  10/10/2020 FINDINGS: Interval improvement in interstitial edema. Persistent left retrocardiac consolidation/atelectasis. No new airspace disease. Heart size and mediastinal contours are within normal limits. Aortic Atherosclerosis (ICD10-170.0). Small bilateral pleural effusions. Visualized bones unremarkable. IMPRESSION: Improving interstitial edema or infiltrates, with persistent small pleural effusions. Electronically Signed   By: Lucrezia Europe M.D.   On: 10/18/2020 09:25   DG Shoulder Right Port  Result Date: 10/13/2020 CLINICAL DATA:  Right shoulder pain. EXAM: PORTABLE RIGHT SHOULDER COMPARISON:  None. FINDINGS: No acute fracture or dislocation identified. Density following the course of the lateral humeral head may relate to prior surgery/rotator cuff repair. Mild degenerative disease of the [facility] joint. No bony lesions or destruction. Soft tissues are unremarkable. IMPRESSION: No acute findings. Density following the lateral humeral head may relate to prior rotator cuff repair. Electronically Signed   By: Aletta Edouard M.D.   On: 10/13/2020 14:00   DG Swallowing Func-Speech Pathology  Result Date: 10/18/2020 Table formatting from the original result was not included. Objective Swallowing Evaluation: Type of Study: MBS-Modified Barium Swallow Study  Patient Details Name: Crystal Osborne MRN: [id number] Date of  Birth: [birth year] Today's Date: 10/18/2020 Time: SLP Start Time (ACUTE ONLY): 1008 -SLP Stop Time (ACUTE ONLY): [id number] SLP Time Calculation (min) (ACUTE ONLY): 11 min Past Medical History: Past Medical History: Diagnosis Date  AAA (abdominal aortic aneurysm) [facility])   sees Dr Eden Lathe   B12 deficiency   Cancer of right breast [facility]) [place] 11/28/2008  sees Dr Oneida Alar   COPD (chronic obstructive pulmonary disease) ([place]) 01/25/2014  CVA (cerebrovascular accident) ([place]) 2006  Diabetes mellitus ([place]) 05/29/2010  A1c 6.0 on October 2011   Hyperlipidemia   Hypertension   Osteoporosis 2003  Pneumonia ~ 2014; ~ 2015  Prolapse of female bladder, acquired   Shingles   repeated episone 9/11 went to a UC  TIA (transient ischemic attack) ~ 2005  Transient hypothyroidism  d/t thyroidectomy  UTI (lower urinary tract infection) May 2013 Past Surgical History: Past Surgical History: Procedure Laterality Date  ABDOMINAL HYSTERECTOMY    no oophorectomy  CATARACT EXTRACTION W/ INTRAOCULAR LENS  IMPLANT, BILATERAL Bilateral   DILATION AND CURETTAGE OF UTERUS    MASTECTOMY, RADICAL Right 1960s  remotely   PERIPHERAL VASCULAR CATHETERIZATION  12/03/2015  PERIPHERAL VASCULAR CATHETERIZATION N/A 12/03/2015  Procedure: Abdominal Aortogram;  Surgeon: Lorretta Harp, MD;  Location: [place] CV LAB;  Service: Cardiovascular;  Laterality: N/A;  PERIPHERAL VASCULAR CATHETERIZATION Bilateral 12/03/2015  Procedure: Lower Extremity Angiography;  Surgeon: Lorretta Harp, MD;  Location: [place] [facility];  Service: Cardiovascular;  Laterality: Bilateral;  PERIPHERAL VASCULAR CATHETERIZATION Bilateral 12/03/2015  Procedure: Peripheral Vascular Intervention;  Surgeon: Lorretta Harp, MD;  Location: [place] [facility];  Service: Cardiovascular;  Laterality: Bilateral;  86mx58mm [place]ream bilateral Common Iliac Arteries  PERIPHERAL VASCULAR CATHETERIZATION Bilateral 12/03/2015  Procedure: Peripheral Vascular Atherectomy;  Surgeon:  JLorretta Harp MD;  [place] [facility];  Service: Cardiovascular;  Laterality: Bilateral;  Common Iliac Arteries  THYROIDECTOMY   HPI: Pt is an 87y.o. female who presented with confusion. CT head negative.    CXR 8/31: Small left pleural effusion with adjacent airspace disease which may reflect atelectasis or pneumonia. Pt reported to MD having difficulty swallow and being fearful of choking; pt stated at that time that she comforatbly eats mashed foods like banana, but gets scared if food has increased consistency. Discharge cancelled and SLP consulted. RN's note on 9/7 denies pt having difficulty. PMH: essential hypertension, hypertrophic cardiomyopathy, tobacco abuse, history of COPD, hyperlipidemia.  No data recorded Assessment / Plan / Recommendation CHL IP CLINICAL IMPRESSIONS 10/18/2020 Clinical Impression Pt presents with oropharyngeal dysphagia characterized by impaired bolus cohesion, a pharyngeal delay, reduced pharyngeal constriction, and reduced anterior laryngeal movement. She demonstrated premature spillage to the pyriform sinuses, posterior pharyngeal wall residue, and mild pyriform sinus residue. Penetration (PAS 2) was noted with consecutive swallows of nectar thick liquids and aspiration (PAS 7) was observed once when a pill was taken with nectar thick liquids via straw. Penetration (PAS 3,5) and silent aspiration (PAS 8) were noted with thin liquids via cup. Laryngeal invasion was secondary to the pharyngeal delay and only larger amounts of aspiration triggered coughing, but this did not facilitate expulsion of the aspirate. A chin tuck posture and effortful swallows were ineffective in eliminating laryngeal invasion, but an effortful swallow did improve pharyngeal clearance. A dysphagia 3 diet with nectar thick liquids is recommended at this time. SLP will follow for treatment. SLP Visit Diagnosis Dysphagia, oropharyngeal phase (R13.12) Attention and concentration deficit following  -- Frontal lobe and executive function deficit following -- Impact on safety and function Mild aspiration risk   CHL IP TREATMENT RECOMMENDATION 10/18/2020 Treatment Recommendations Therapy as outlined in treatment plan below   Prognosis 10/18/2020 Prognosis for Safe Diet Advancement Good Barriers to Reach Goals -- Barriers/Prognosis Comment -- CHL IP DIET RECOMMENDATION 10/18/2020 SLP Diet Recommendations Dysphagia 3 (Mech soft) solids;Nectar thick liquid Liquid Administration via Cup;No straw Medication Administration Whole meds with puree Compensations Slow rate;Small sips/bites Postural Changes Seated upright at 90 degrees   CHL IP OTHER RECOMMENDATIONS 10/18/2020 Recommended Consults -- Oral Care Recommendations Oral care BID Other Recommendations --   CHL IP FOLLOW UP RECOMMENDATIONS 10/18/2020 Follow up [facility]ations [facility] D[facility] Speech Therapy Frequency (ACUTE ONLY) min 2x/week Treatment Duration 2 weeks  CHL IP ORAL PHASE 10/18/2020 Oral Phase Impaired Oral - Pudding Teaspoon -- Oral - Pudding Cup -- Oral - Honey Teaspoon -- Oral - Honey Cup -- Oral - Nectar Teaspoon -- Oral - Nectar Cup Decreased bolus cohesion;Premature spillage Oral - Nectar Straw Decreased bolus cohesion;Premature spillage Oral - Thin Teaspoon -- Oral - Thin Cup Decreased bolus cohesion;Premature spillage Oral - Thin Straw Decreased velopharyngeal closure Oral - Puree Decreased bolus cohesion;Premature spillage Oral - Mech Soft -- Oral - Regular Impaired mastication Oral - Multi-Consistency -- Oral - Pill Decreased bolus cohesion;Premature spillage Oral Phase - Comment --  CHL IP PHARYNGEAL PHASE 10/18/2020 Pharyngeal Phase Impaired Pharyngeal- Pudding Teaspoon -- Pharyngeal -- Pharyngeal- Pudding Cup -- Pharyngeal -- Pharyngeal- Honey Teaspoon -- Pharyngeal -- Pharyngeal- Honey Cup -- Pharyngeal -- Pharyngeal- Nectar Teaspoon -- Pharyngeal -- Pharyngeal- Nectar Cup Delayed swallow  initiation-pyriform sinuses;Reduced pharyngeal peristalsis;Pharyngeal residue - pyriform;Pharyngeal residue - posterior pharnyx;Penetration/Aspiration during swallow;Reduced anterior laryngeal mobility Pharyngeal Material enters airway, remains ABOVE vocal cords then ejected out Pharyngeal- Nectar Straw Delayed swallow initiation-pyriform sinuses;Reduced pharyngeal peristalsis;Pharyngeal residue - pyriform;Pharyngeal residue - posterior pharnyx;Penetration/Aspiration during swallow;Reduced anterior laryngeal mobility Pharyngeal Material enters airway, remains ABOVE vocal cords then ejected out;Material enters airway, passes BELOW cords and not ejected out despite cough attempt by patient Pharyngeal- Thin Teaspoon -- Pharyngeal -- Pharyngeal- Thin Cup Delayed swallow initiation-pyriform sinuses;Reduced pharyngeal peristalsis;Pharyngeal residue - pyriform;Pharyngeal residue - posterior pharnyx;Penetration/Aspiration during swallow;Reduced anterior laryngeal mobility Pharyngeal Material enters airway, remains ABOVE vocal cords and not ejected out;Material enters airway, CONTACTS cords and not ejected out;Material enters airway, passes BELOW cords without attempt by patient to eject out (silent aspiration) Pharyngeal- Thin Straw -- Pharyngeal -- Pharyngeal- Puree Delayed swallow initiation-pyriform sinuses;Reduced pharyngeal peristalsis;Pharyngeal residue - pyriform;Pharyngeal residue - posterior pharnyx;Penetration/Aspiration during swallow;Reduced anterior laryngeal mobility Pharyngeal -- Pharyngeal- Mechanical Soft -- Pharyngeal -- Pharyngeal- Regular Delayed swallow initiation-pyriform sinuses;Reduced pharyngeal peristalsis;Pharyngeal residue - pyriform;Pharyngeal residue - posterior pharnyx;Reduced anterior laryngeal mobility Pharyngeal -- Pharyngeal- Multi-consistency -- Pharyngeal -- Pharyngeal- Pill Delayed swallow initiation-pyriform sinuses;Reduced pharyngeal peristalsis;Pharyngeal residue -  pyriform;Pharyngeal residue - posterior pharnyx;Reduced anterior laryngeal mobility Pharyngeal -- Pharyngeal Comment --  CHL IP CERVICAL ESOPHAGEAL PHASE 10/18/2020 Cervical Esophageal Phase WFL Pudding Teaspoon -- Pudding Cup -- Honey Teaspoon -- Honey Cup -- Nectar Teaspoon -- Nectar Cup -- Nectar Straw -- Thin Teaspoon -- Thin Cup -- Thin Straw -- Puree -- Mechanical Soft -- Regular -- Multi-consistency -- Pill -- Cervical Esophageal Comment -- Shanika I. Hardin Negus, [place], [place] Office number [phone] Pager [phone] Horton Marshall 10/18/2020, 11:01 AM              ECHOCARDIOGRAM COMPLETE  Result Date: 10/01/2020    ECHOCARDIOGRAM REPORT   Patient Name:   Crystal Osborne Date of Exam: 10/01/2020 Medical Rec #:  [id number]     Height:       65.0 in Accession #:    [id number]    Weight:       109.3 lb Date of Birth:  [birth year]     BSA:          1.530 m Patient Age:    24 years      BP:           168/82 mmHg Patient Gender: F             HR:           43 bpm. Exam Location:  [facility] Procedure: 2D Echo, Intracardiac Opacification Agent, Cardiac Doppler and  Color            Doppler Indications:    R55 Pre-Syncope  History:        Patient has prior history of Echocardiogram examinations, most                 recent 09/25/2016. CAD, PAD, COPD and TIA, Arrythmias:WPW; Risk                 Factors:Diabetes and HLD.  Sonographer:    Marygrace Drought RCS Referring Phys: [id number] [place]  1. Significant hypertrophy of LV apex with slit like cavity of LV apex. Frequent ectopy/bigeminy, makes assessment of LVEF difficult OVerall LVEF appears mildly decreased.. The left ventricular internal cavity size was mildly dilated.  2. Right ventricular systolic function is low normal. The right ventricular size is normal. There is mildly elevated pulmonary artery systolic pressure.  3. Left atrial size was mildly dilated.  4. Mild mitral valve regurgitation.  5. Tricuspid  valve regurgitation is mild to moderate.  6. The aortic valve is tricuspid. Aortic valve regurgitation is not visualized. Mild to moderate aortic valve sclerosis/calcification is present, without any evidence of aortic stenosis.  7. The inferior vena cava is normal in size with greater than 50% respiratory variability, suggesting right atrial pressure of 3 mmHg. FINDINGS  Left Ventricle: Significant hypertrophy of LV apex with slit like cavity of LV apex. Frequent ectopy/bigeminy, makes assessment of LVEF difficult OVerall LVEF appears mildly decreased. The left ventricular internal cavity size was mildly dilated. There is apical left ventricular hypertrophy. Right Ventricle: The right ventricular size is normal. Right vetricular wall thickness was not assessed. Right ventricular systolic function is low normal. There is mildly elevated pulmonary artery systolic pressure. The tricuspid regurgitant velocity is  3.18 m/s, and with an assumed right atrial pressure of 3 mmHg, the estimated right ventricular systolic pressure is [id number] mmHg. Left Atrium: Left atrial size was mildly dilated. Right Atrium: Right atrial size was normal in size. Pericardium: There is no evidence of pericardial effusion. Mitral Valve: There is mild thickening of the mitral valve leaflet(s). Mild to moderate mitral annular calcification. Mild mitral valve regurgitation. Tricuspid Valve: The tricuspid valve is normal in structure. Tricuspid valve regurgitation is mild to moderate. Aortic Valve: The aortic valve is tricuspid. Aortic valve regurgitation is not visualized. Mild to moderate aortic valve sclerosis/calcification is present, without any evidence of aortic stenosis. Pulmonic Valve: The pulmonic valve was normal in structure. Pulmonic valve regurgitation is trivial. Aorta: The aortic root and ascending aorta are structurally normal, with no evidence of dilitation. Venous: The inferior vena cava is normal in size with greater than 50%  respiratory variability, suggesting right atrial pressure of 3 mmHg. IAS/Shunts: No atrial level shunt detected by color flow Doppler.  LEFT VENTRICLE PLAX 2D LVIDd:         5.30 cm  Diastology LVIDs:         4.30 cm  LV e' medial:    4.79 cm/s LV PW:         1.00 cm  LV E/e' medial:  23.0 LV IVS:        1.00 cm  LV e' lateral:   9.14 cm/s LVOT diam:     1.80 cm  LV E/e' lateral: 12.0 LV SV:         53 LV SV Index:   35 LVOT Area:     2.54 cm  RIGHT VENTRICLE RV Basal diam:  4.00 cm  RVSP:           43.4 mmHg LEFT ATRIUM             Index       RIGHT ATRIUM           Index LA diam:        3.10 cm 2.03 cm/m  RA Pressure: 3.00 mmHg LA Vol (A2C):   54.5 ml 35.61 ml/m RA Area:     14.70 cm LA Vol (A4C):   54.7 ml 35.74 ml/m RA Volume:   37.40 ml  24.44 ml/m LA Biplane Vol: 56.3 ml 36.79 ml/m  AORTIC VALVE LVOT Vmax:   89.40 cm/s LVOT Vmean:  63.400 cm/s LVOT VTI:    0.208 m  AORTA Ao Root diam: 3.00 cm Ao Asc diam:  2.90 cm MITRAL VALVE                TRICUSPID VALVE MV Area (PHT):              TR Peak grad:   40.4 mmHg MV Decel Time:              TR Vmax:        318.00 cm/s MR Peak grad: 135.5 mmHg    Estimated RAP:  3.00 mmHg MR Mean grad: 93.0 mmHg     RVSP:           43.4 mmHg MR Vmax:      582.00 cm/s MR Vmean:     458.0 cm/s    SHUNTS MV E velocity: 110.00 cm/s  Systemic VTI:  0.21 m MV A velocity: 48.00 cm/s   Systemic Diam: 1.80 cm MV E/A ratio:  2.29 Dorris Carnes MD Electronically signed by Dorris Carnes MD Signature Date/Time: 10/01/2020/10:11:08 PM    Final    LONG TERM MONITOR (3-14 DAYS)  Result Date: 10/02/2020  Frequent PVCs (15% of beats)  7 episodes of SVT, longest lasting 14 seconds with average rate 107 bpm  1 episode of NSVT lasting 8 beats  Patch Wear Time:  3 days and 3 hours (2022-08-07T09:07:09-398 to 2022-08-10T13:04:10-398) Patient had a min HR of 52 bpm, max HR of 182 bpm, and avg HR of 70 bpm. Predominant underlying rhythm was Sinus Rhythm. 1 run of Ventricular Tachycardia occurred  lasting 8 beats with a max rate of 141 bpm (avg 131 bpm). 7 Supraventricular Tachycardia runs occurred, the run with the fastest interval lasting 7 beats with a max rate of 182 bpm, the longest lasting 13.6 secs with an avg rate of 107 bpm. Isolated SVEs were rare (<1.0%), SVE Couplets were rare (<1.0%), and SVE Triplets were rare (<1.0%). Isolated VEs were frequent (15.2%, 48737), VE Couplets were rare (<1.0%, 564), and VE Triplets were rare (<1.0%, 66). Ventricular Bigeminy and Trigeminy were present.  6 patient triggered events, corresponding to sinus rhythm  PACs/PVCs and ventricular bigeminy.    Subjective: Seen and examined at bedside and she had just come back from her modified barium swallow.  She denied complaints of pain.  No nausea or vomiting.  Wanted to rest.  No other concerns or complaints at this time is stable for discharge at this time  Discharge Exam: Vitals:   10/18/20 0800 10/18/20 0927  BP: (!) 164/67   Pulse: 66 77  Resp: 20   Temp:    SpO2: 94% 94%   Vitals:   10/18/20 0400 10/18/20 0725 10/18/20 0800 10/18/20 0927  BP:  (!) 167/66 (!) 164/67  Pulse:  63 66 77  Resp:  17 20   Temp: 98.1 F (36.7 C) 98.3 F (36.8 C)    TempSrc: Oral Oral    SpO2:  96% 94% 94%   General: Pt is alert, awake, not in acute distress Cardiovascular: RRR, S1/S2 +, no rubs, no gallops Respiratory: Diminished bilaterally, no wheezing, no rhonchi; unlabored breathing and not wearing any supplemental oxygen via nasal cannula Abdominal: Soft, NT, ND, bowel sounds + Extremities: no edema, no cyanosis  The results of significant diagnostics from this hospitalization (including imaging, microbiology, ancillary and laboratory) are listed below for reference.    Microbiology: Recent Results (from the past 240 hour(s))  Culture, blood (routine x 2)     Status: None   Collection Time: 10/10/20  3:00 PM   Specimen: BLOOD  Result Value Ref Range Status   Specimen Description BLOOD RIGHT  ANTECUBITAL  Final   Special Requests   Final    BOTTLES DRAWN AEROBIC AND ANAEROBIC Blood Culture adequate volume   Culture   Final    NO GROWTH 5 DAYS Performed at [facility], [address], [place], [place] [address]    Report Status 10/15/2020 FINAL  Final  Resp Panel by RT-PCR (Flu A&B, Covid) Nasopharyngeal Swab     Status: None   Collection Time: 10/10/20  3:29 PM   Specimen: Nasopharyngeal Swab; Nasopharyngeal(NP) swabs in vial transport medium  Result Value Ref Range Status   SARS Coronavirus 2 by RT PCR NEGATIVE NEGATIVE Final    Comment: (NOTE) SARS-CoV-2 target nucleic acids are NOT DETECTED.  The SARS-CoV-2 RNA is generally detectable in upper respiratory specimens during the acute phase of infection. The lowest concentration of SARS-CoV-2 viral copies this assay can detect is 138 copies/mL. A negative result does not preclude SARS-Cov-2 infection and should not be used as the sole basis for treatment or other patient management decisions. A negative result may occur with  improper specimen collection/handling, submission of specimen other than nasopharyngeal swab, presence of viral mutation(s) within the areas targeted by this assay, and inadequate number of viral copies(<138 copies/mL). A negative result must be combined with clinical observations, patient history, and epidemiological information. The expected result is Negative.  Fact Sheet for Patients:  EntrepreneurPulse.com.au  Fact Sheet for Healthcare Providers:  IncredibleEmployment.be  This test is no t yet approved or cleared by the [place] FDA and  has been authorized for detection and/or diagnosis of SARS-CoV-2 by FDA under an Emergency Use Authorization (EUA). This EUA will remain  in effect (meaning this test can be used) for the duration of the COVID-19 declaration under Section 564(b)(1) of the Act, 21 U.S.C.section 360bbb-3(b)(1), unless the  authorization is terminated  or revoked sooner.       Influenza A by PCR NEGATIVE NEGATIVE Final   Influenza B by PCR NEGATIVE NEGATIVE Final    Comment: (NOTE) The Xpert Xpress SARS-CoV-2/FLU/RSV plus assay is intended as an aid in the diagnosis of influenza from Nasopharyngeal swab specimens and should not be used as a sole basis for treatment. Nasal washings and aspirates are unacceptable for Xpert Xpress SARS-CoV-2/FLU/RSV testing.  Fact Sheet for Patients: EntrepreneurPulse.com.au  Fact Sheet for Healthcare Providers: IncredibleEmployment.be  This test is not yet approved or cleared by the [place] FDA and has been authorized for detection and/or diagnosis of SARS-CoV-2 by FDA under an Emergency Use Authorization (EUA). This EUA will remain in effect (meaning this test can be used) for the duration of  the COVID-19 declaration under Section 564(b)(1) of the Act, 21 U.S.C. section 360bbb-3(b)(1), unless the authorization is terminated or revoked.  Performed at [facility], [place] [address] [place], [place] [address]   Culture, blood (routine x 2)     Status: None   Collection Time: 10/10/20  4:06 PM   Specimen: BLOOD RIGHT WRIST  Result Value Ref Range Status   Specimen Description BLOOD RIGHT WRIST  Final   Special Requests   Final    BOTTLES DRAWN AEROBIC AND ANAEROBIC Blood Culture results may not be optimal due to an inadequate volume of blood received in culture bottles   Culture   Final    NO GROWTH 5 DAYS Performed at [facility], [place] [address], [place], [place] [address]    Report Status 10/15/2020 FINAL  Final  MRSA Next Gen by PCR, Nasal     Status: None   Collection Time: 10/11/20  4:52 AM   Specimen: Nasal Mucosa; Nasal Swab  Result Value Ref Range Status   MRSA by PCR Next Gen NOT DETECTED NOT DETECTED Final    Comment: (NOTE) The GeneXpert MRSA Assay (FDA approved for NASAL specimens only), is one  component of a comprehensive MRSA colonization surveillance program. It is not intended to diagnose MRSA infection nor to guide or monitor treatment for MRSA infections. Test performance is not FDA approved in patients less than 46 years old. Performed at [facility], [place] [address] [place], [place] [address]   Resp Panel by RT-PCR (Flu A&B, Covid) Nasopharyngeal Swab     Status: None   Collection Time: 10/15/20  3:54 PM   Specimen: Nasopharyngeal Swab; Nasopharyngeal(NP) swabs in vial transport medium  Result Value Ref Range Status   SARS Coronavirus 2 by RT PCR NEGATIVE NEGATIVE Final    Comment: (NOTE) SARS-CoV-2 target nucleic acids are NOT DETECTED.  The SARS-CoV-2 RNA is generally detectable in upper respiratory specimens during the acute phase of infection. The lowest concentration of SARS-CoV-2 viral copies this assay can detect is 138 copies/mL. A negative result does not preclude SARS-Cov-2 infection and should not be used as the sole basis for treatment or other patient management decisions. A negative result may occur with  improper specimen collection/handling, submission of specimen other than nasopharyngeal swab, presence of viral mutation(s) within the areas targeted by this assay, and inadequate number of viral copies(<138 copies/mL). A negative result must be combined with clinical observations, patient history, and epidemiological information. The expected result is Negative.  Fact Sheet for Patients:  EntrepreneurPulse.com.au  Fact Sheet for Healthcare Providers:  IncredibleEmployment.be  This test is no t yet approved or cleared by the [place] FDA and  has been authorized for detection and/or diagnosis of SARS-CoV-2 by FDA under an Emergency Use Authorization (EUA). This EUA will remain  in effect (meaning this test can be used) for the duration of the COVID-19 declaration under Section 564(b)(1) of the Act,  21 U.S.C.section 360bbb-3(b)(1), unless the authorization is terminated  or revoked sooner.       Influenza A by PCR NEGATIVE NEGATIVE Final   Influenza B by PCR NEGATIVE NEGATIVE Final    Comment: (NOTE) The Xpert Xpress SARS-CoV-2/FLU/RSV plus assay is intended as an aid in the diagnosis of influenza from Nasopharyngeal swab specimens and should not be used as a sole basis for treatment. Nasal washings and aspirates are unacceptable for Xpert Xpress SARS-CoV-2/FLU/RSV testing.  Fact Sheet for Patients: EntrepreneurPulse.com.au  Fact Sheet for  Healthcare Providers: IncredibleEmployment.be  This test is not yet approved or cleared by the [place] and has been authorized for detection and/or diagnosis of SARS-CoV-2 by FDA under an Emergency Use Authorization (EUA). This EUA will remain in effect (meaning this test can be used) for the duration of the COVID-19 declaration under Section 564(b)(1) of the Act, 21 U.S.C. section 360bbb-3(b)(1), unless the authorization is terminated or revoked.  Performed at [facility], [place] [address] [place], [place] [address]   Resp Panel by RT-PCR (Flu A&B, Covid) Nasopharyngeal Swab     Status: None   Collection Time: 10/18/20  8:29 AM   Specimen: Nasopharyngeal Swab; Nasopharyngeal(NP) swabs in vial transport medium  Result Value Ref Range Status   SARS Coronavirus 2 by RT PCR NEGATIVE NEGATIVE Final    Comment: (NOTE) SARS-CoV-2 target nucleic acids are NOT DETECTED.  The SARS-CoV-2 RNA is generally detectable in upper respiratory specimens during the acute phase of infection. The lowest concentration of SARS-CoV-2 viral copies this assay can detect is 138 copies/mL. A negative result does not preclude SARS-Cov-2 infection and should not be used as the sole basis for treatment or other patient management decisions. A negative result may occur with  improper specimen  collection/handling, submission of specimen other than nasopharyngeal swab, presence of viral mutation(s) within the areas targeted by this assay, and inadequate number of viral copies(<138 copies/mL). A negative result must be combined with clinical observations, patient history, and epidemiological information. The expected result is Negative.  Fact Sheet for Patients:  EntrepreneurPulse.com.au  Fact Sheet for Healthcare Providers:  IncredibleEmployment.be  This test is no t yet approved or cleared by the [place] FDA and  has been authorized for detection and/or diagnosis of SARS-CoV-2 by FDA under an Emergency Use Authorization (EUA). This EUA will remain  in effect (meaning this test can be used) for the duration of the COVID-19 declaration under Section 564(b)(1) of the Act, 21 U.S.C.section 360bbb-3(b)(1), unless the authorization is terminated  or revoked sooner.       Influenza A by PCR NEGATIVE NEGATIVE Final   Influenza B by PCR NEGATIVE NEGATIVE Final    Comment: (NOTE) The Xpert Xpress SARS-CoV-2/FLU/RSV plus assay is intended as an aid in the diagnosis of influenza from Nasopharyngeal swab specimens and should not be used as a sole basis for treatment. Nasal washings and aspirates are unacceptable for Xpert Xpress SARS-CoV-2/FLU/RSV testing.  Fact Sheet for Patients: EntrepreneurPulse.com.au  Fact Sheet for Healthcare Providers: IncredibleEmployment.be  This test is not yet approved or cleared by the [place] FDA and has been authorized for detection and/or diagnosis of SARS-CoV-2 by FDA under an Emergency Use Authorization (EUA). This EUA will remain in effect (meaning this test can be used) for the duration of the COVID-19 declaration under Section 564(b)(1) of the Act, 21 U.S.C. section 360bbb-3(b)(1), unless the authorization is terminated or revoked.  Performed at [facility], [place] [address], [place], [place] [address]     Labs: BNP (last 3 results) No results for input(s): BNP in the last 8760 hours. Basic Metabolic Panel: Recent Labs  Lab 10/12/20 0358 10/13/20 0209 10/14/20 0112 10/17/20 0828 10/18/20 0358  NA 134* 138 137 136 138  K 4.1 4.2 3.6 4.4 3.8  CL 102 105 107 106 107  CO2 '23 22 22 23 '$ 21*  GLUCOSE 129* 91 115* 85 79  BUN '16 16 17 23 '$ 27*  CREATININE 0.92 0.84 0.77 0.77 0.79  CALCIUM 8.3*  8.6* 8.3* 8.1* 8.3*  MG  --   --   --  1.9 1.8  PHOS  --   --   --  3.3 3.5   Liver Function Tests: Recent Labs  Lab 10/12/20 0358 10/17/20 0828 10/18/20 0358  AST 58* 35 20  ALT 110* 29 25  ALKPHOS 97 70 65  BILITOT 0.7 2.0* 1.1  PROT 5.5* 4.9* 5.0*  ALBUMIN 2.8* 2.4* 2.5*   No results for input(s): LIPASE, AMYLASE in the last 168 hours. No results for input(s): AMMONIA in the last 168 hours. CBC: Recent Labs  Lab 10/12/20 0358 10/13/20 0209 10/14/20 0112 10/17/20 0828 10/18/20 0358  WBC 15.2* 15.0* 13.8* 10.5 9.4  NEUTROABS  --   --   --  8.6* 7.3  HGB 13.1 13.0 12.3 13.0 13.1  HCT 40.0 39.0 37.5 39.7 40.1  MCV 94.3 93.1 92.6 93.0 93.7  PLT 197 206 238 249 251   Cardiac Enzymes: No results for input(s): CKTOTAL, CKMB, CKMBINDEX, TROPONINI in the last 168 hours. BNP: Invalid input(s): POCBNP CBG: No results for input(s): GLUCAP in the last 168 hours. D-Dimer No results for input(s): DDIMER in the last 72 hours. Hgb A1c No results for input(s): HGBA1C in the last 72 hours. Lipid Profile No results for input(s): CHOL, HDL, LDLCALC, TRIG, CHOLHDL, LDLDIRECT in the last 72 hours. Thyroid function studies No results for input(s): TSH, T4TOTAL, T3FREE, THYROIDAB in the last 72 hours.  Invalid input(s): FREET3 Anemia work up No results for input(s): VITAMINB12, FOLATE, FERRITIN, TIBC, IRON, RETICCTPCT in the last 72 hours. Urinalysis    Component Value Date/Time   COLORURINE YELLOW 10/10/2020 1645    APPEARANCEUR HAZY (A) 10/10/2020 1645   LABSPEC 1.010 10/10/2020 1645   PHURINE 5.0 10/10/2020 1645   GLUCOSEU NEGATIVE 10/10/2020 1645   GLUCOSEU NEGATIVE 08/21/2014 1027   HGBUR MODERATE (A) 10/10/2020 1645   HGBUR negative 09/25/2009 1034   BILIRUBINUR NEGATIVE 10/10/2020 1645   BILIRUBINUR Neg 05/26/2013 0827   KETONESUR NEGATIVE 10/10/2020 1645   PROTEINUR 100 (A) 10/10/2020 1645   UROBILINOGEN 0.2 08/21/2014 1027   NITRITE POSITIVE (A) 10/10/2020 1645   LEUKOCYTESUR NEGATIVE 10/10/2020 1645   Sepsis Labs Invalid input(s): PROCALCITONIN,  WBC,  LACTICIDVEN Microbiology Recent Results (from the past 240 hour(s))  Culture, blood (routine x 2)     Status: None   Collection Time: 10/10/20  3:00 PM   Specimen: BLOOD  Result Value Ref Range Status   Specimen Description BLOOD RIGHT ANTECUBITAL  Final   Special Requests   Final    BOTTLES DRAWN AEROBIC AND ANAEROBIC Blood Culture adequate volume   Culture   Final    NO GROWTH 5 DAYS Performed at [facility], [address] [place], [place] [address]    Report Status 10/15/2020 FINAL  Final  Resp Panel by RT-PCR (Flu A&B, Covid) Nasopharyngeal Swab     Status: None   Collection Time: 10/10/20  3:29 PM   Specimen: Nasopharyngeal Swab; Nasopharyngeal(NP) swabs in vial transport medium  Result Value Ref Range Status   SARS Coronavirus 2 by RT PCR NEGATIVE NEGATIVE Final    Comment: (NOTE) SARS-CoV-2 target nucleic acids are NOT DETECTED.  The SARS-CoV-2 RNA is generally detectable in upper respiratory specimens during the acute phase of infection. The lowest concentration of SARS-CoV-2 viral copies this assay can detect is 138 copies/mL. A negative result does not preclude SARS-Cov-2 infection and should not be used as the sole basis for treatment or  other patient management decisions. A negative result may occur with  improper specimen collection/handling, submission of specimen other than nasopharyngeal swab,  presence of viral mutation(s) within the areas targeted by this assay, and inadequate number of viral copies(<138 copies/mL). A negative result must be combined with clinical observations, patient history, and epidemiological information. The expected result is Negative.  Fact Sheet for Patients:  EntrepreneurPulse.com.au  Fact Sheet for Healthcare Providers:  IncredibleEmployment.be  This test is no t yet approved or cleared by the [place] FDA and  has been authorized for detection and/or diagnosis of SARS-CoV-2 by FDA under an Emergency Use Authorization (EUA). This EUA will remain  in effect (meaning this test can be used) for the duration of the COVID-19 declaration under Section 564(b)(1) of the Act, 21 U.S.C.section 360bbb-3(b)(1), unless the authorization is terminated  or revoked sooner.       Influenza A by PCR NEGATIVE NEGATIVE Final   Influenza B by PCR NEGATIVE NEGATIVE Final    Comment: (NOTE) The Xpert Xpress SARS-CoV-2/FLU/RSV plus assay is intended as an aid in the diagnosis of influenza from Nasopharyngeal swab specimens and should not be used as a sole basis for treatment. Nasal washings and aspirates are unacceptable for Xpert Xpress SARS-CoV-2/FLU/RSV testing.  Fact Sheet for Patients: EntrepreneurPulse.com.au  Fact Sheet for Healthcare Providers: IncredibleEmployment.be  This test is not yet approved or cleared by the [place] FDA and has been authorized for detection and/or diagnosis of SARS-CoV-2 by FDA under an Emergency Use Authorization (EUA). This EUA will remain in effect (meaning this test can be used) for the duration of the COVID-19 declaration under Section 564(b)(1) of the Act, 21 U.S.C. section 360bbb-3(b)(1), unless the authorization is terminated or revoked.  Performed at [facility], [place] [address], [place], [place] [address]   Culture, blood  (routine x 2)     Status: None   Collection Time: 10/10/20  4:06 PM   Specimen: BLOOD RIGHT WRIST  Result Value Ref Range Status   Specimen Description BLOOD RIGHT WRIST  Final   Special Requests   Final    BOTTLES DRAWN AEROBIC AND ANAEROBIC Blood Culture results may not be optimal due to an inadequate volume of blood received in culture bottles   Culture   Final    NO GROWTH 5 DAYS Performed at [facility], [place] [address], [place], [place] [address]    Report Status 10/15/2020 FINAL  Final  MRSA Next Gen by PCR, Nasal     Status: None   Collection Time: 10/11/20  4:52 AM   Specimen: Nasal Mucosa; Nasal Swab  Result Value Ref Range Status   MRSA by PCR Next Gen NOT DETECTED NOT DETECTED Final    Comment: (NOTE) The GeneXpert MRSA Assay (FDA approved for NASAL specimens only), is one component of a comprehensive MRSA colonization surveillance program. It is not intended to diagnose MRSA infection nor to guide or monitor treatment for MRSA infections. Test performance is not FDA approved in patients less than 74 years old. Performed at [facility], [place] [address] [place], [place] [address]   Resp Panel by RT-PCR (Flu A&B, Covid) Nasopharyngeal Swab     Status: None   Collection Time: 10/15/20  3:54 PM   Specimen: Nasopharyngeal Swab; Nasopharyngeal(NP) swabs in vial transport medium  Result Value Ref Range Status   SARS Coronavirus 2 by RT PCR NEGATIVE NEGATIVE Final    Comment: (NOTE) SARS-CoV-2 target nucleic acids are NOT DETECTED.  The SARS-CoV-2 RNA is generally detectable in upper respiratory specimens during the acute phase of infection. The lowest concentration of SARS-CoV-2 viral copies this assay can detect is 138 copies/mL. A negative result does not preclude SARS-Cov-2 infection and should not be used as the sole basis for treatment or other patient management decisions. A negative result may occur with  improper specimen collection/handling,  submission of specimen other than nasopharyngeal swab, presence of viral mutation(s) within the areas targeted by this assay, and inadequate number of viral copies(<138 copies/mL). A negative result must be combined with clinical observations, patient history, and epidemiological information. The expected result is Negative.  Fact Sheet for Patients:  EntrepreneurPulse.com.au  Fact Sheet for Healthcare Providers:  IncredibleEmployment.be  This test is no t yet approved or cleared by the [place] FDA and  has been authorized for detection and/or diagnosis of SARS-CoV-2 by FDA under an Emergency Use Authorization (EUA). This EUA will remain  in effect (meaning this test can be used) for the duration of the COVID-19 declaration under Section 564(b)(1) of the Act, 21 U.S.C.section 360bbb-3(b)(1), unless the authorization is terminated  or revoked sooner.       Influenza A by PCR NEGATIVE NEGATIVE Final   Influenza B by PCR NEGATIVE NEGATIVE Final    Comment: (NOTE) The Xpert Xpress SARS-CoV-2/FLU/RSV plus assay is intended as an aid in the diagnosis of influenza from Nasopharyngeal swab specimens and should not be used as a sole basis for treatment. Nasal washings and aspirates are unacceptable for Xpert Xpress SARS-CoV-2/FLU/RSV testing.  Fact Sheet for Patients: EntrepreneurPulse.com.au  Fact Sheet for Healthcare Providers: IncredibleEmployment.be  This test is not yet approved or cleared by the [place] FDA and has been authorized for detection and/or diagnosis of SARS-CoV-2 by FDA under an Emergency Use Authorization (EUA). This EUA will remain in effect (meaning this test can be used) for the duration of the COVID-19 declaration under Section 564(b)(1) of the Act, 21 U.S.C. section 360bbb-3(b)(1), unless the authorization is terminated or revoked.  Performed at [facility], [place] [address], [place], [place] [address]   Resp Panel by RT-PCR (Flu A&B, Covid) Nasopharyngeal Swab     Status: None   Collection Time: 10/18/20  8:29 AM   Specimen: Nasopharyngeal Swab; Nasopharyngeal(NP) swabs in vial transport medium  Result Value Ref Range Status   SARS Coronavirus 2 by RT PCR NEGATIVE NEGATIVE Final    Comment: (NOTE) SARS-CoV-2 target nucleic acids are NOT DETECTED.  The SARS-CoV-2 RNA is generally detectable in upper respiratory specimens during the acute phase of infection. The lowest concentration of SARS-CoV-2 viral copies this assay can detect is 138 copies/mL. A negative result does not preclude SARS-Cov-2 infection and should not be used as the sole basis for treatment or other patient management decisions. A negative result may occur with  improper specimen collection/handling, submission of specimen other than nasopharyngeal swab, presence of viral mutation(s) within the areas targeted by this assay, and inadequate number of viral copies(<138 copies/mL). A negative result must be combined with clinical observations, patient history, and epidemiological information. The expected result is Negative.  Fact Sheet for Patients:  EntrepreneurPulse.com.au  Fact Sheet for Healthcare Providers:  IncredibleEmployment.be  This test is no t yet approved or cleared by the [place] FDA and  has been authorized for detection and/or diagnosis of SARS-CoV-2 by FDA under an Emergency Use Authorization (EUA). This EUA will remain  in effect (meaning this test can be used) for the duration  of the COVID-19 declaration under Section 564(b)(1) of the Act, 21 U.S.C.section 360bbb-3(b)(1), unless the authorization is terminated  or revoked sooner.       Influenza A by PCR NEGATIVE NEGATIVE Final   Influenza B by PCR NEGATIVE NEGATIVE Final    Comment: (NOTE) The Xpert Xpress SARS-CoV-2/FLU/RSV plus assay is intended as an aid in the  diagnosis of influenza from Nasopharyngeal swab specimens and should not be used as a sole basis for treatment. Nasal washings and aspirates are unacceptable for Xpert Xpress SARS-CoV-2/FLU/RSV testing.  Fact Sheet for Patients: EntrepreneurPulse.com.au  Fact Sheet for Healthcare Providers: IncredibleEmployment.be  This test is not yet approved or cleared by the [place] FDA and has been authorized for detection and/or diagnosis of SARS-CoV-2 by FDA under an Emergency Use Authorization (EUA). This EUA will remain in effect (meaning this test can be used) for the duration of the COVID-19 declaration under Section 564(b)(1) of the Act, 21 U.S.C. section 360bbb-3(b)(1), unless the authorization is terminated or revoked.  Performed at [facility], [place] [address], [place], [place] [address]    Time coordinating discharge: 35 minutes  SIGNED:  Kerney Elbe, DO [facility] 10/18/2020, 12:42 PM Pager is on [place]  If 7PM-7AM, please contact night-coverage [email or web address]

## 2020-10-18 NOTE — TOC Progression Note (Addendum)
Transition of Care [facility]) - Progression Note    Patient Details  Name: Crystal Osborne MRN: [id number] Date of Birth: 29-Dec-1931  Transition [facility]) CM/SW Contact  Joanne Chars, LCSW Phone Number: 10/18/2020, 3:54 PM  Clinical Narrative:   Updated clinicals faxed to [facility].  Pt auth expired 9/6 and will need new auth.      Expected Discharge Plan: [place] Barriers to Discharge: Continued Medical Work up, SNF Pending bed offer  Expected Discharge Plan and Services Expected Discharge Plan: [place] Choice: [place] arrangements for the past 2 months: Single Family Home Expected Discharge Date: 10/18/20                                     Social Determinants of Health (SDOH) Interventions    Readmission Risk Interventions No flowsheet data found.

## 2020-10-19 ENCOUNTER — Encounter: Payer: Self-pay | Admitting: Adult Health

## 2020-10-19 DIAGNOSIS — E782 Mixed hyperlipidemia: Secondary | ICD-10-CM | POA: Diagnosis not present

## 2020-10-19 DIAGNOSIS — I779 Disorder of arteries and arterioles, unspecified: Secondary | ICD-10-CM | POA: Diagnosis not present

## 2020-10-19 DIAGNOSIS — E890 Postprocedural hypothyroidism: Secondary | ICD-10-CM | POA: Diagnosis not present

## 2020-10-19 DIAGNOSIS — Z743 Need for continuous supervision: Secondary | ICD-10-CM | POA: Diagnosis not present

## 2020-10-19 DIAGNOSIS — R2681 Unsteadiness on feet: Secondary | ICD-10-CM | POA: Diagnosis not present

## 2020-10-19 DIAGNOSIS — I10 Essential (primary) hypertension: Secondary | ICD-10-CM | POA: Diagnosis not present

## 2020-10-19 DIAGNOSIS — I739 Peripheral vascular disease, unspecified: Secondary | ICD-10-CM | POA: Diagnosis not present

## 2020-10-19 DIAGNOSIS — J9601 Acute respiratory failure with hypoxia: Secondary | ICD-10-CM | POA: Diagnosis not present

## 2020-10-19 DIAGNOSIS — R410 Disorientation, unspecified: Secondary | ICD-10-CM | POA: Diagnosis not present

## 2020-10-19 DIAGNOSIS — J189 Pneumonia, unspecified organism: Secondary | ICD-10-CM | POA: Diagnosis not present

## 2020-10-19 DIAGNOSIS — M6281 Muscle weakness (generalized): Secondary | ICD-10-CM | POA: Diagnosis not present

## 2020-10-19 DIAGNOSIS — G9341 Metabolic encephalopathy: Secondary | ICD-10-CM | POA: Diagnosis not present

## 2020-10-19 DIAGNOSIS — J9690 Respiratory failure, unspecified, unspecified whether with hypoxia or hypercapnia: Secondary | ICD-10-CM | POA: Diagnosis not present

## 2020-10-19 DIAGNOSIS — R2689 Other abnormalities of gait and mobility: Secondary | ICD-10-CM | POA: Diagnosis not present

## 2020-10-19 DIAGNOSIS — I6350 Cerebral infarction due to unspecified occlusion or stenosis of unspecified cerebral artery: Secondary | ICD-10-CM | POA: Diagnosis not present

## 2020-10-19 DIAGNOSIS — R1310 Dysphagia, unspecified: Secondary | ICD-10-CM | POA: Diagnosis not present

## 2020-10-19 DIAGNOSIS — F17200 Nicotine dependence, unspecified, uncomplicated: Secondary | ICD-10-CM | POA: Diagnosis not present

## 2020-10-19 DIAGNOSIS — R1312 Dysphagia, oropharyngeal phase: Secondary | ICD-10-CM | POA: Diagnosis not present

## 2020-10-19 DIAGNOSIS — J449 Chronic obstructive pulmonary disease, unspecified: Secondary | ICD-10-CM | POA: Diagnosis not present

## 2020-10-19 NOTE — Plan of Care (Signed)

## 2020-10-19 NOTE — Plan of Care (Signed)
  Problem: Education: Goal: Knowledge of General Education information will improve Description: Including pain rating scale, medication(s)/side effects and non-pharmacologic comfort measures 10/19/2020 1327 by Morene Rankins, LPN Outcome: Adequate for Discharge 10/19/2020 0747 by Morene Rankins, LPN Outcome: Adequate for Discharge   Problem: Health Behavior/Discharge Planning: Goal: Ability to manage health-related needs will improve 10/19/2020 1327 by Morene Rankins, LPN Outcome: Adequate for Discharge 10/19/2020 0747 by Morene Rankins, LPN Outcome: Adequate for Discharge   Problem: Clinical Measurements: Goal: Ability to maintain clinical measurements within normal limits will improve 10/19/2020 1327 by Morene Rankins, LPN Outcome: Adequate for Discharge 10/19/2020 0747 by Morene Rankins, LPN Outcome: Adequate for Discharge Goal: Will remain free from infection 10/19/2020 1327 by Morene Rankins, LPN Outcome: Adequate for Discharge 10/19/2020 0747 by Morene Rankins, LPN Outcome: Adequate for Discharge Goal: Diagnostic test results will improve 10/19/2020 1327 by Morene Rankins, LPN Outcome: Adequate for Discharge 10/19/2020 0747 by Morene Rankins, LPN Outcome: Adequate for Discharge Goal: Respiratory complications will improve 10/19/2020 1327 by Morene Rankins, LPN Outcome: Adequate for Discharge 10/19/2020 0747 by Morene Rankins, LPN Outcome: Adequate for Discharge Goal: Cardiovascular complication will be avoided 10/19/2020 1327 by Morene Rankins, LPN Outcome: Adequate for Discharge 10/19/2020 0747 by Morene Rankins, LPN Outcome: Adequate for Discharge   Problem: Activity: Goal: Risk for activity intolerance will decrease 10/19/2020 1327 by Morene Rankins, LPN Outcome: Adequate for Discharge 10/19/2020 0747 by Morene Rankins, LPN Outcome: Adequate for Discharge   Problem: Nutrition: Goal: Adequate nutrition will be maintained 10/19/2020 1327 by Morene Rankins,  LPN Outcome: Adequate for Discharge 10/19/2020 0747 by Morene Rankins, LPN Outcome: Adequate for Discharge   Problem: Coping: Goal: Level of anxiety will decrease 10/19/2020 1327 by Morene Rankins, LPN Outcome: Adequate for Discharge 10/19/2020 0747 by Morene Rankins, LPN Outcome: Adequate for Discharge   Problem: Elimination: Goal: Will not experience complications related to bowel motility 10/19/2020 1327 by Morene Rankins, LPN Outcome: Adequate for Discharge 10/19/2020 0747 by Morene Rankins, LPN Outcome: Adequate for Discharge Goal: Will not experience complications related to urinary retention 10/19/2020 1327 by Morene Rankins, LPN Outcome: Adequate for Discharge 10/19/2020 0747 by Morene Rankins, LPN Outcome: Adequate for Discharge   Problem: Pain Managment: Goal: General experience of comfort will improve 10/19/2020 1327 by Morene Rankins, LPN Outcome: Adequate for Discharge 10/19/2020 0747 by Morene Rankins, LPN Outcome: Adequate for Discharge   Problem: Safety: Goal: Ability to remain free from injury will improve 10/19/2020 1327 by Morene Rankins, LPN Outcome: Adequate for Discharge 10/19/2020 0747 by Morene Rankins, LPN Outcome: Adequate for Discharge   Problem: Skin Integrity: Goal: Risk for impaired skin integrity will decrease 10/19/2020 1327 by Morene Rankins, LPN Outcome: Adequate for Discharge 10/19/2020 0747 by Morene Rankins, LPN Outcome: Adequate for Discharge   Problem: SLP Dysphagia Goals Goal: Patient will utilize recommended strategies Description: Patient will utilize recommended strategies during swallow to increase swallowing safety with Outcome: Adequate for Discharge

## 2020-10-19 NOTE — TOC Transition Note (Signed)
Transition of Care [facility]) - CM/SW Discharge Note   Patient Details  Name: Crystal Osborne MRN: [id number] Date of Birth: Apr 01, 1931  Transition of Care [facility]) CM/SW Contact:  Joanne Chars, LCSW Phone Number: 10/19/2020, 11:02 AM   Clinical Narrative:   Pt discharging to [facility].  RN call report to [phone].     Final next level of care: Skilled Nursing Facility Barriers to Discharge: Barriers Resolved   Patient Goals and CMS Choice Patient states their goals for this hospitalization and ongoing recovery are:: "back to where I was" CMS Medicare.gov Compare Post Acute Care list provided to:: Patient Choice offered to / list presented to : Patient  Discharge Placement              Patient chooses bed at:  [facility]) Patient to be transferred to facility by: [facility] Name of family member notified: son [name] Patient and family notified of of transfer: 10/19/20  Discharge Plan and Services     Post Acute Care Choice: [place]                               Social Determinants of Health (SDOH) Interventions     Readmission Risk Interventions No flowsheet data found.

## 2020-10-19 NOTE — Discharge Summary (Signed)
Physician Discharge Summary  ACEY DOBBERSTEIN [id number] DOB: 01/08/1932 DOA: 10/10/2020  PCP: Colon Branch, MD  Admit date: 10/10/2020 Discharge date: 10/19/2020  Admitted From: Home Disposition: SNF  Recommendations for Outpatient Follow-up:  Follow up with PCP in 1-2 weeks Continue Dysphagia 3 Diet with Nectar Thick Liquids Please obtain CMP/CBC, Mag, Phos in one week Please follow up on the following pending results:  Home Health: No Equipment/Devices: None  Discharge Condition: Stable CODE STATUS: DO NOT RESUSCITATE  Diet recommendation: Dysphagia 3 Diet with Nectar Thick Liquids   Brief/Interim Summary: The patient is a 87 year old Caucasian female with past medical history significant for but not admitted to essential hypertension, hypertrophic cardiomyopathy, tobacco abuse, history of COPD, hyperlipidemia as well as other comorbidities who was in her usual state of health until last few days when her patient's son noticed that she was confused.  She usually has normal cognitive function at baseline and denies any history of dementia.  Patient was quite agitated and unable to provide a subjective history during that admission.  Most of the history is obtained from the son and she has been experiencing shoulder discomfort about 2 to weeks for which she was prescribed tramadol.  She is also recently changed from lisinopril to metoprolol after seeing the cardiology clinic for her HOCM.  She also had a echocardiogram which she recently showed hypertrophic cardiomyopathy.  There is no evidence of any fevers or chills.  In the ED she was having a temperature of 97.3 and she is mildly tachycardic.  Initial saturations were 96% on 2 L.  She subsequently was noted to be more hypoxic with saturations in 80s and was placed on nonrebreather.  Chest x-ray obtained raise concern for left-sided pneumonia.  She is COVID-negative.  Initial WBC showed that she had a WBC of 12.9.  She was delirious and  hospitalized for further management and improved with her acute metabolic encephalopathy.  Hypoxic respiratory failure from community-acquired pneumonia is also improved.  She is stable to be discharged to SNF the day before yesterday however prior to discharge she complained of some dysphagia so discharge was canceled.  SLP evaluating and recommending MBS which was 10/18/20 SLP now recommends dysphagia 3 diet with nectar thick liquids.  She is deemed medically stable to be discharged at this time and continue diet advancement and repeat speech evaluation at SNF.  ADDENDUM 10/19/20: Patient was stable to D/C to SNF yesterday but Insurance Authorization had not been obtained. It has been obtained this AM. No acute changes overnight and remains medically stable to D/C to SNF.   Discharge Diagnoses:  Principal Problem:   CAP (community acquired pneumonia) Active Problems:   TOBACCO ABUSE   Essential hypertension   COPD (chronic obstructive pulmonary disease) (HCC)   Acute metabolic encephalopathy   Hypertrophic cardiomyopathy (HCC)  Acute Metabolic Encephalopathy, Acute Delirium -possibly multifactorial due to medication with tramadol, infectious process.   -She has been having intermittent nightly confusion likely due to hospital stay.  -She developed a tremor on 9/4 possibly due to ceftriaxone/Haldol, medications discontinued and tremor resolved.   -Currently she is alert and oriented x4 and back to baseline  -C/w Delirium Precautions    Acute Hypoxic Respiratory Failure due to Community-Acquired Pneumonia, Sepsis ruled out -She was on nonrebreather on admission, she was placed on broad-spectrum antibiotics with ceftriaxone and azithromycin with improvement in her respiratory status.   -SpO2: 94 % O2 Flow Rate (L/min): 2 L/min; No longer on Supplemental O2  -Once  ceftriaxone was discontinued due to tremor as above she was switched to doxycycline and has 1 additional days left for 7-day course  upon discharge.    Dysphagia -Improving; was able to swallow medications applesauce without difficulty and was also able to drink water without choking or coughing -SLP evaluating and wanting to do an MBS; MBS done and SLP recommending Dysphagia 3 Diet with Nectar Thick Liquids -Continue to Monitor in the outpaitent setting and have SLP evaluate at SNF for further diet advancement    Right Shoulder Pain -Possibly osteoarthritic in nature/small trauma history reports that occasionally she bumped her right shoulder into the wall when trying to ambulate.   -She has good range of motion, no ecchymosis or fracture.   -X-ray without acute findings.   -C/w Conservative management   Recently diagnosed hypertrophic cardiomyopathy -Continue Metoprolol 12.5 mg po Daily  -Follow up with Cardiology as an outpatient   COPD -Stable -C/w DuoNeb 3 mL Neb q4hprn Wheezing and SOB while hospitalized    Tobacco use -Smoking Cessation Counseling given  -smokes half a pack a day, hopefully she will quit -C/w Nicotine 14 mg TD patch q24h   Hyperlipidemia -Resume home Atorvastatin 40 mg po qHS   Hyperbilirubinemia -Mild and likely Reactive -Patient's T Bili went from 0.7 -> 2.0 -> 1.1 -Continue to Monitor and Trend    Elevated LFTs -Possibly due to acute illness and improved -Patient's AST is now 20 and ALT is now 25 -Continue to Monitor and Trend in the outpatient setting    Discharge Instructions  Discharge Instructions     Call MD for:  difficulty breathing, headache or visual disturbances   Complete by: As directed    Call MD for:  extreme fatigue   Complete by: As directed    Call MD for:  hives   Complete by: As directed    Call MD for:  persistant dizziness or light-headedness   Complete by: As directed    Call MD for:  persistant nausea and vomiting   Complete by: As directed    Call MD for:  redness, tenderness, or signs of infection (pain, swelling, redness, odor or green/yellow  discharge around incision site)   Complete by: As directed    Call MD for:  severe uncontrolled pain   Complete by: As directed    Call MD for:  temperature >100.4   Complete by: As directed    Diet - low sodium heart healthy   Complete by: As directed    Dysphagia 3 Diet with Nectar Thick Liquids   Discharge instructions   Complete by: As directed    You were cared for by a hospitalist during your hospital stay. If you have any questions about your discharge medications or the care you received while you were in the hospital after you are discharged, you can call the unit and ask to speak with the hospitalist on call if the hospitalist that took care of you is not available. Once you are discharged, your primary care physician will handle any further medical issues. Please note that NO REFILLS for any discharge medications will be authorized once you are discharged, as it is imperative that you return to your primary care physician (or establish a relationship with a primary care physician if you do not have one) for your aftercare needs so that they can reassess your need for medications and monitor your lab values.  Follow up with PCP and Cardiology as an outpatient. Take all medications as  prescribed. If symptoms change or worsen please return to the ED for evaluation   Increase activity slowly   Complete by: As directed       Allergies as of 10/19/2020       Reactions   Septra [sulfamethoxazole-trimethoprim] Itching   Pt states she had a severe allergic reaction; she was in the ICU for 2 days because of taking this.   Ciprofloxacin Hcl Itching   Codeine Rash   Made her feel very strange        Medication List     TAKE these medications    acetaminophen 500 MG tablet Commonly known as: TYLENOL Take 1,000 mg by mouth 2 (two) times daily as needed (for pain.).   albuterol 108 (90 Base) MCG/ACT inhaler Commonly known as: VENTOLIN HFA Inhale 2 puffs into the lungs every 6  (six) hours as needed for wheezing or shortness of breath.   ALPRAZolam 0.25 MG tablet Commonly known as: XANAX Take 1-2 tablets (0.25-0.5 mg total) by mouth at bedtime as needed for sleep.   AMBULATORY NON FORMULARY MEDICATION Right Breast Prosthesis  Dx: [id number]   aspirin EC 81 MG tablet Take 81 mg by mouth every evening.   atorvastatin 40 MG tablet Commonly known as: LIPITOR Take 1 tablet (40 mg total) by mouth daily. What changed: when to take this   metoprolol succinate 25 MG 24 hr tablet Commonly known as: Toprol XL Take 0.5 tablets (12.5 mg total) by mouth daily.   POTASSIUM PO Take 1 tablet by mouth daily. OTC   psyllium 28 % packet Commonly known as: METAMUCIL SMOOTH TEXTURE Take 1 packet by mouth 2 (two) times daily as needed (constipation).       ASK your doctor about these medications    doxycycline 100 MG tablet Commonly known as: VIBRA-TABS Take 1 tablet (100 mg total) by mouth every 12 (twelve) hours for 2 days. Ask about: Should I take this medication?        Contact information for after-discharge care     Destination     [facility] Preferred SNF .   Service: Skilled Nursing Contact information: [id number] [address] [phone]                    Allergies  Allergen Reactions   Septra [Sulfamethoxazole-Trimethoprim] Itching    Pt states she had a severe allergic reaction; she was in the ICU for 2 days because of taking this.   Ciprofloxacin Hcl Itching   Codeine Rash    Made her feel very strange    Consultations: SLP  Procedures/Studies: DG Chest 1 View  Result Date: 10/10/2020 CLINICAL DATA:  Altered mental status, shortness of breath EXAM: CHEST  1 VIEW COMPARISON:  Chest radiograph 11/09/2015 FINDINGS: The heart is enlarged. The mediastinal contours are within normal limits. There is calcified atherosclerotic plaque of the aortic arch. There are coarsened  interstitial markings throughout both lungs, likely chronic. There is a small left pleural effusion with adjacent airspace disease. The right costophrenic angle is cut off. There is no overt pulmonary edema. There is no pneumothorax. There is no acute osseous abnormality. IMPRESSION: 1. Small left pleural effusion with adjacent airspace disease which may reflect atelectasis or pneumonia. 2. Mild cardiomegaly. Electronically Signed   By: Valetta Mole M.D.   On: 10/10/2020 16:01   CT Head Wo Contrast  Result Date: 10/10/2020 CLINICAL DATA:  Mental status change EXAM: CT HEAD WITHOUT CONTRAST  TECHNIQUE: Contiguous axial images were obtained from the base of the skull through the vertex without intravenous contrast. COMPARISON:  CT 07/20/2016, MRI 09/22/2020 FINDINGS: Brain: Motion degradation. No acute territorial infarction, hemorrhage or intracranial mass. Moderate severe chronic small vessel ischemic changes of the white matter. Chronic lacunar infarct within the left white matter and basal ganglia. Chronic left cerebellar infarct. Stable ventricle size. Atrophy Vascular: No hyperdense vessels. Carotid vascular and vertebral calcification Skull: Normal. Negative for fracture or focal lesion. Sinuses/Orbits: No acute finding. Other: None IMPRESSION: 1. Motion degraded study. 2. No definite CT evidence for acute intracranial abnormality. Atrophy and chronic small vessel ischemic changes of the white matter. Electronically Signed   By: Donavan Foil M.D.   On: 10/10/2020 19:42   MR Brain Wo Contrast  Result Date: 09/23/2020 CLINICAL DATA:  Dizziness, persistent/recurrent, cardiac or vascular cause suspected. Ataxia 2-3 months. EXAM: MRI HEAD WITHOUT CONTRAST TECHNIQUE: Multiplanar, multiecho pulse sequences of the brain and surrounding structures were obtained without intravenous contrast. COMPARISON:  Head CT 07/20/2016.  MRI 11/16/2005. FINDINGS: Brain: Diffusion imaging does not show any acute or subacute  infarction. No focal abnormality affects the brainstem. Old small vessel infarction within the left cerebellum. Cerebral hemispheres show moderate chronic small-vessel ischemic changes throughout the white matter. No cortical or large vessel territory infarction. No mass lesion, hemorrhage, hydrocephalus or extra-axial collection. Findings are progressive since 2007 as expected. Vascular: Major vessels at the base of the brain show flow. Skull and upper cervical spine: Negative Sinuses/Orbits: Clear/normal Other: None IMPRESSION: No acute or reversible finding. Old left cerebellar small vessel infarction. Moderate chronic small-vessel ischemic changes of the cerebral hemispheric white matter, progressive since 2007. Electronically Signed   By: Nelson Chimes M.D.   On: 09/23/2020 15:35   DG CHEST PORT 1 VIEW  Result Date: 10/18/2020 CLINICAL DATA:  SOB (shortness of breath) hx copd EXAM: PORTABLE CHEST - 1 VIEW COMPARISON:  10/10/2020 FINDINGS: Interval improvement in interstitial edema. Persistent left retrocardiac consolidation/atelectasis. No new airspace disease. Heart size and mediastinal contours are within normal limits. Aortic Atherosclerosis (ICD10-170.0). Small bilateral pleural effusions. Visualized bones unremarkable. IMPRESSION: Improving interstitial edema or infiltrates, with persistent small pleural effusions. Electronically Signed   By: Lucrezia Europe M.D.   On: 10/18/2020 09:25   DG Shoulder Right Port  Result Date: 10/13/2020 CLINICAL DATA:  Right shoulder pain. EXAM: PORTABLE RIGHT SHOULDER COMPARISON:  None. FINDINGS: No acute fracture or dislocation identified. Density following the course of the lateral humeral head may relate to prior surgery/rotator cuff repair. Mild degenerative disease of the [facility] joint. No bony lesions or destruction. Soft tissues are unremarkable. IMPRESSION: No acute findings. Density following the lateral humeral head may relate to prior rotator cuff repair.  Electronically Signed   By: Aletta Edouard M.D.   On: 10/13/2020 14:00   DG Swallowing Func-Speech Pathology  Result Date: 10/18/2020 Table formatting from the original result was not included. Objective Swallowing Evaluation: Type of Study: MBS-Modified Barium Swallow Study  Patient Details Name: CARTHA PORRAS MRN: [id number] Date of Birth: 1931/05/11 Today's Date: 10/18/2020 Time: SLP Start Time (ACUTE ONLY): 1008 -SLP Stop Time (ACUTE ONLY): 70 SLP Time Calculation (min) (ACUTE ONLY): 11 min Past Medical History: Past Medical History: Diagnosis Date  AAA (abdominal aortic aneurysm) [facility])   sees Dr Eden Lathe   B12 deficiency   Cancer of right breast [facility]) [place] 11/28/2008  sees Dr Oneida Alar   COPD (chronic obstructive pulmonary disease) ([place]) 01/25/2014  CVA (  cerebrovascular accident) ([place]) 2006  Diabetes mellitus ([place]) 05/29/2010  A1c 6.0 on October 2011   Hyperlipidemia   Hypertension   Osteoporosis 2003  Pneumonia ~ 2014; ~ 2015  Prolapse of female bladder, acquired   Shingles   repeated episone 9/11 went to a UC  TIA (transient ischemic attack) ~ 2005  Transient hypothyroidism   d/t thyroidectomy  UTI (lower urinary tract infection) May 2013 Past Surgical History: Past Surgical History: Procedure Laterality Date  ABDOMINAL HYSTERECTOMY    no oophorectomy  CATARACT EXTRACTION W/ INTRAOCULAR LENS  IMPLANT, BILATERAL Bilateral   DILATION AND CURETTAGE OF UTERUS    MASTECTOMY, RADICAL Right 1960s  remotely   PERIPHERAL VASCULAR CATHETERIZATION  12/03/2015  PERIPHERAL VASCULAR CATHETERIZATION N/A 12/03/2015  Procedure: Abdominal Aortogram;  Surgeon: Lorretta Harp, MD;  Location: [place] [facility];  Service: Cardiovascular;  Laterality: N/A;  PERIPHERAL VASCULAR CATHETERIZATION Bilateral 12/03/2015  Procedure: Lower Extremity Angiography;  Surgeon: Lorretta Harp, MD;  Location: [place] [facility];  Service: Cardiovascular;  Laterality: Bilateral;  PERIPHERAL VASCULAR CATHETERIZATION Bilateral  12/03/2015  Procedure: Peripheral Vascular Intervention;  Surgeon: Lorretta Harp, MD;  Location: [place] [facility];  Service: Cardiovascular;  Laterality: Bilateral;  52mx58mm [place] bilateral Common Iliac Arteries  PERIPHERAL VASCULAR CATHETERIZATION Bilateral 12/03/2015  Procedure: Peripheral Vascular Atherectomy;  Surgeon: JLorretta Harp MD;  [facility];  Service: Cardiovascular;  Laterality: Bilateral;  Common Iliac Arteries  THYROIDECTOMY   HPI: Pt is an 87y.o. female who presented with confusion. CT head negative.    CXR 8/31: Small left pleural effusion with adjacent airspace disease which may reflect atelectasis or pneumonia. Pt reported to MD having difficulty swallow and being fearful of choking; pt stated at that time that she comforatbly eats mashed foods like banana, but gets scared if food has increased consistency. Discharge cancelled and SLP consulted. RN's note on 9/7 denies pt having difficulty. PMH: essential hypertension, hypertrophic cardiomyopathy, tobacco abuse, history of COPD, hyperlipidemia.  No data recorded Assessment / Plan / Recommendation CHL IP CLINICAL IMPRESSIONS 10/18/2020 Clinical Impression Pt presents with oropharyngeal dysphagia characterized by impaired bolus cohesion, a pharyngeal delay, reduced pharyngeal constriction, and reduced anterior laryngeal movement. She demonstrated premature spillage to the pyriform sinuses, posterior pharyngeal wall residue, and mild pyriform sinus residue. Penetration (PAS 2) was noted with consecutive swallows of nectar thick liquids and aspiration (PAS 7) was observed once when a pill was taken with nectar thick liquids via straw. Penetration (PAS 3,5) and silent aspiration (PAS 8) were noted with thin liquids via cup. Laryngeal invasion was secondary to the pharyngeal delay and only larger amounts of aspiration triggered coughing, but this did not facilitate expulsion of the aspirate. A chin tuck posture and  effortful swallows were ineffective in eliminating laryngeal invasion, but an effortful swallow did improve pharyngeal clearance. A dysphagia 3 diet with nectar thick liquids is recommended at this time. SLP will follow for treatment. SLP Visit Diagnosis Dysphagia, oropharyngeal phase (R13.12) Attention and concentration deficit following -- Frontal lobe and executive function deficit following -- Impact on safety and function Mild aspiration risk   CHL IP TREATMENT RECOMMENDATION 10/18/2020 Treatment Recommendations Therapy as outlined in treatment plan below   Prognosis 10/18/2020 Prognosis for Safe Diet Advancement Good Barriers to Reach Goals -- Barriers/Prognosis Comment -- CHL IP DIET RECOMMENDATION 10/18/2020 SLP Diet Recommendations Dysphagia 3 (Mech soft) solids;Nectar thick liquid Liquid Administration via Cup;No straw Medication Administration Whole meds with puree Compensations Slow rate;Small sips/bites Postural  Changes Seated upright at 90 degrees   CHL IP OTHER RECOMMENDATIONS 10/18/2020 Recommended Consults -- Oral Care Recommendations Oral care BID Other Recommendations --   CHL IP FOLLOW UP RECOMMENDATIONS 10/18/2020 Follow up Recommendations Skilled Nursing facility   [facility] IP FREQUENCY AND DURATION 10/18/2020 Speech Therapy Frequency (ACUTE ONLY) min 2x/week Treatment Duration 2 weeks      CHL IP ORAL PHASE 10/18/2020 Oral Phase Impaired Oral - Pudding Teaspoon -- Oral - Pudding Cup -- Oral - Honey Teaspoon -- Oral - Honey Cup -- Oral - Nectar Teaspoon -- Oral - Nectar Cup Decreased bolus cohesion;Premature spillage Oral - Nectar Straw Decreased bolus cohesion;Premature spillage Oral - Thin Teaspoon -- Oral - Thin Cup Decreased bolus cohesion;Premature spillage Oral - Thin Straw Decreased velopharyngeal closure Oral - Puree Decreased bolus cohesion;Premature spillage Oral - Mech Soft -- Oral - Regular Impaired mastication Oral - Multi-Consistency -- Oral - Pill Decreased bolus cohesion;Premature spillage Oral  Phase - Comment --  CHL IP PHARYNGEAL PHASE 10/18/2020 Pharyngeal Phase Impaired Pharyngeal- Pudding Teaspoon -- Pharyngeal -- Pharyngeal- Pudding Cup -- Pharyngeal -- Pharyngeal- Honey Teaspoon -- Pharyngeal -- Pharyngeal- Honey Cup -- Pharyngeal -- Pharyngeal- Nectar Teaspoon -- Pharyngeal -- Pharyngeal- Nectar Cup Delayed swallow initiation-pyriform sinuses;Reduced pharyngeal peristalsis;Pharyngeal residue - pyriform;Pharyngeal residue - posterior pharnyx;Penetration/Aspiration during swallow;Reduced anterior laryngeal mobility Pharyngeal Material enters airway, remains ABOVE vocal cords then ejected out Pharyngeal- Nectar Straw Delayed swallow initiation-pyriform sinuses;Reduced pharyngeal peristalsis;Pharyngeal residue - pyriform;Pharyngeal residue - posterior pharnyx;Penetration/Aspiration during swallow;Reduced anterior laryngeal mobility Pharyngeal Material enters airway, remains ABOVE vocal cords then ejected out;Material enters airway, passes BELOW cords and not ejected out despite cough attempt by patient Pharyngeal- Thin Teaspoon -- Pharyngeal -- Pharyngeal- Thin Cup Delayed swallow initiation-pyriform sinuses;Reduced pharyngeal peristalsis;Pharyngeal residue - pyriform;Pharyngeal residue - posterior pharnyx;Penetration/Aspiration during swallow;Reduced anterior laryngeal mobility Pharyngeal Material enters airway, remains ABOVE vocal cords and not ejected out;Material enters airway, CONTACTS cords and not ejected out;Material enters airway, passes BELOW cords without attempt by patient to eject out (silent aspiration) Pharyngeal- Thin Straw -- Pharyngeal -- Pharyngeal- Puree Delayed swallow initiation-pyriform sinuses;Reduced pharyngeal peristalsis;Pharyngeal residue - pyriform;Pharyngeal residue - posterior pharnyx;Penetration/Aspiration during swallow;Reduced anterior laryngeal mobility Pharyngeal -- Pharyngeal- Mechanical Soft -- Pharyngeal -- Pharyngeal- Regular Delayed swallow initiation-pyriform  sinuses;Reduced pharyngeal peristalsis;Pharyngeal residue - pyriform;Pharyngeal residue - posterior pharnyx;Reduced anterior laryngeal mobility Pharyngeal -- Pharyngeal- Multi-consistency -- Pharyngeal -- Pharyngeal- Pill Delayed swallow initiation-pyriform sinuses;Reduced pharyngeal peristalsis;Pharyngeal residue - pyriform;Pharyngeal residue - posterior pharnyx;Reduced anterior laryngeal mobility Pharyngeal -- Pharyngeal Comment --  CHL IP CERVICAL ESOPHAGEAL PHASE 10/18/2020 Cervical Esophageal Phase WFL Pudding Teaspoon -- Pudding Cup -- Honey Teaspoon -- Honey Cup -- Nectar Teaspoon -- Nectar Cup -- Nectar Straw -- Thin Teaspoon -- Thin Cup -- Thin Straw -- Puree -- Mechanical Soft -- Regular -- Multi-consistency -- Pill -- Cervical Esophageal Comment -- Shanika I. Hardin Negus, [place], [place] Office number [phone] Pager [phone] Horton Marshall 10/18/2020, 11:01 AM              ECHOCARDIOGRAM COMPLETE  Result Date: 10/01/2020    ECHOCARDIOGRAM REPORT   Patient Name:   Crystal Osborne Date of Exam: 10/01/2020 Medical Rec #:  [id number]     Height:       65.0 in Accession #:    [id number]    Weight:       109.3 lb Date of Birth:  [birth year]     BSA:          1.530 m Patient Age:  87 years      BP:           168/82 mmHg Patient Gender: F             HR:           43 bpm. Exam Location:  [address] Procedure: 2D Echo, Intracardiac Opacification Agent, Cardiac Doppler and Color            Doppler Indications:    R55 Pre-Syncope  History:        Patient has prior history of Echocardiogram examinations, most                 recent 09/25/2016. CAD, PAD, COPD and TIA, Arrythmias:WPW; Risk                 Factors:Diabetes and HLD.  Sonographer:    Marygrace Drought RCS Referring Phys: [id number] [place]  1. Significant hypertrophy of LV apex with slit like cavity of LV apex. Frequent ectopy/bigeminy, makes assessment of LVEF difficult OVerall LVEF appears mildly  decreased.. The left ventricular internal cavity size was mildly dilated.  2. Right ventricular systolic function is low normal. The right ventricular size is normal. There is mildly elevated pulmonary artery systolic pressure.  3. Left atrial size was mildly dilated.  4. Mild mitral valve regurgitation.  5. Tricuspid valve regurgitation is mild to moderate.  6. The aortic valve is tricuspid. Aortic valve regurgitation is not visualized. Mild to moderate aortic valve sclerosis/calcification is present, without any evidence of aortic stenosis.  7. The inferior vena cava is normal in size with greater than 50% respiratory variability, suggesting right atrial pressure of 3 mmHg. FINDINGS  Left Ventricle: Significant hypertrophy of LV apex with slit like cavity of LV apex. Frequent ectopy/bigeminy, makes assessment of LVEF difficult OVerall LVEF appears mildly decreased. The left ventricular internal cavity size was mildly dilated. There is apical left ventricular hypertrophy. Right Ventricle: The right ventricular size is normal. Right vetricular wall thickness was not assessed. Right ventricular systolic function is low normal. There is mildly elevated pulmonary artery systolic pressure. The tricuspid regurgitant velocity is  3.18 m/s, and with an assumed right atrial pressure of 3 mmHg, the estimated right ventricular systolic pressure is [id number] mmHg. Left Atrium: Left atrial size was mildly dilated. Right Atrium: Right atrial size was normal in size. Pericardium: There is no evidence of pericardial effusion. Mitral Valve: There is mild thickening of the mitral valve leaflet(s). Mild to moderate mitral annular calcification. Mild mitral valve regurgitation. Tricuspid Valve: The tricuspid valve is normal in structure. Tricuspid valve regurgitation is mild to moderate. Aortic Valve: The aortic valve is tricuspid. Aortic valve regurgitation is not visualized. Mild to moderate aortic valve sclerosis/calcification is  present, without any evidence of aortic stenosis. Pulmonic Valve: The pulmonic valve was normal in structure. Pulmonic valve regurgitation is trivial. Aorta: The aortic root and ascending aorta are structurally normal, with no evidence of dilitation. Venous: The inferior vena cava is normal in size with greater than 50% respiratory variability, suggesting right atrial pressure of 3 mmHg. IAS/Shunts: No atrial level shunt detected by color flow Doppler.  LEFT VENTRICLE PLAX 2D LVIDd:         5.30 cm  Diastology LVIDs:         4.30 cm  LV e' medial:    4.79 cm/s LV PW:         1.00 cm  LV E/e' medial:  23.0 LV IVS:  1.00 cm  LV e' lateral:   9.14 cm/s LVOT diam:     1.80 cm  LV E/e' lateral: 12.0 LV SV:         53 LV SV Index:   35 LVOT Area:     2.54 cm  RIGHT VENTRICLE RV Basal diam:  4.00 cm RVSP:           43.4 mmHg LEFT ATRIUM             Index       RIGHT ATRIUM           Index LA diam:        3.10 cm 2.03 cm/m  RA Pressure: 3.00 mmHg LA Vol (A2C):   54.5 ml 35.61 ml/m RA Area:     14.70 cm LA Vol (A4C):   54.7 ml 35.74 ml/m RA Volume:   37.40 ml  24.44 ml/m LA Biplane Vol: 56.3 ml 36.79 ml/m  AORTIC VALVE LVOT Vmax:   89.40 cm/s LVOT Vmean:  63.400 cm/s LVOT VTI:    0.208 m  AORTA Ao Root diam: 3.00 cm Ao Asc diam:  2.90 cm MITRAL VALVE                TRICUSPID VALVE MV Area (PHT):              TR Peak grad:   40.4 mmHg MV Decel Time:              TR Vmax:        318.00 cm/s MR Peak grad: 135.5 mmHg    Estimated RAP:  3.00 mmHg MR Mean grad: 93.0 mmHg     RVSP:           43.4 mmHg MR Vmax:      582.00 cm/s MR Vmean:     458.0 cm/s    SHUNTS MV E velocity: 110.00 cm/s  Systemic VTI:  0.21 m MV A velocity: 48.00 cm/s   Systemic Diam: 1.80 cm MV E/A ratio:  2.29 Dorris Carnes MD Electronically signed by Dorris Carnes MD Signature Date/Time: 10/01/2020/10:11:08 PM    Final    LONG TERM MONITOR (3-14 DAYS)  Result Date: 10/02/2020  Frequent PVCs (15% of beats)  7 episodes of SVT, longest lasting 14  seconds with average rate 107 bpm  1 episode of NSVT lasting 8 beats  Patch Wear Time:  3 days and 3 hours (2022-08-07T09:07:09-398 to 2022-08-10T13:04:10-398) Patient had a min HR of 52 bpm, max HR of 182 bpm, and avg HR of 70 bpm. Predominant underlying rhythm was Sinus Rhythm. 1 run of Ventricular Tachycardia occurred lasting 8 beats with a max rate of 141 bpm (avg 131 bpm). 7 Supraventricular Tachycardia runs occurred, the run with the fastest interval lasting 7 beats with a max rate of 182 bpm, the longest lasting 13.6 secs with an avg rate of 107 bpm. Isolated SVEs were rare (<1.0%), SVE Couplets were rare (<1.0%), and SVE Triplets were rare (<1.0%). Isolated VEs were frequent (15.2%, 48737), VE Couplets were rare (<1.0%, 564), and VE Triplets were rare (<1.0%, 66). Ventricular Bigeminy and Trigeminy were present.  6 patient triggered events, corresponding to sinus rhythm  PACs/PVCs and ventricular bigeminy.    Subjective: Seen and examined at bedside and she had just come back from her modified barium swallow.  She denied complaints of pain.  No nausea or vomiting.  Wanted to rest.  No other concerns or complaints at this time is stable for  discharge at this time  Discharge Exam: Vitals:   10/19/20 0400 10/19/20 0744  BP: (!) 154/61 (!) 154/70  Pulse: 66   Resp: 20   Temp: 98.1 F (36.7 C)   SpO2: 94%    Vitals:   10/19/20 0010 10/19/20 0057 10/19/20 0400 10/19/20 0744  BP: (!) 178/80 (!) 162/68 (!) 154/61 (!) 154/70  Pulse: 71 81 66   Resp: '20 20 20   '$ Temp: 98.2 F (36.8 C)  98.1 F (36.7 C)   TempSrc: Axillary  Oral   SpO2: 93% 93% 94%    General: Pt is alert, awake, not in acute distress Cardiovascular: RRR, S1/S2 +, no rubs, no gallops Respiratory: Diminished bilaterally, no wheezing, no rhonchi; unlabored breathing and not wearing any supplemental oxygen via nasal cannula Abdominal: Soft, NT, ND, bowel sounds + Extremities: no edema, no cyanosis  The results of  significant diagnostics from this hospitalization (including imaging, microbiology, ancillary and laboratory) are listed below for reference.    Microbiology: Recent Results (from the past 240 hour(s))  Culture, blood (routine x 2)     Status: None   Collection Time: 10/10/20  3:00 PM   Specimen: BLOOD  Result Value Ref Range Status   Specimen Description BLOOD RIGHT ANTECUBITAL  Final   Special Requests   Final    BOTTLES DRAWN AEROBIC AND ANAEROBIC Blood Culture adequate volume   Culture   Final    NO GROWTH 5 DAYS Performed at [facility], [address], [place], [place] [address]    Report Status 10/15/2020 FINAL  Final  Resp Panel by RT-PCR (Flu A&B, Covid) Nasopharyngeal Swab     Status: None   Collection Time: 10/10/20  3:29 PM   Specimen: Nasopharyngeal Swab; Nasopharyngeal(NP) swabs in vial transport medium  Result Value Ref Range Status   SARS Coronavirus 2 by RT PCR NEGATIVE NEGATIVE Final    Comment: (NOTE) SARS-CoV-2 target nucleic acids are NOT DETECTED.  The SARS-CoV-2 RNA is generally detectable in upper respiratory specimens during the acute phase of infection. The lowest concentration of SARS-CoV-2 viral copies this assay can detect is 138 copies/mL. A negative result does not preclude SARS-Cov-2 infection and should not be used as the sole basis for treatment or other patient management decisions. A negative result may occur with  improper specimen collection/handling, submission of specimen other than nasopharyngeal swab, presence of viral mutation(s) within the areas targeted by this assay, and inadequate number of viral copies(<138 copies/mL). A negative result must be combined with clinical observations, patient history, and epidemiological information. The expected result is Negative.  Fact Sheet for Patients:  EntrepreneurPulse.com.au  Fact Sheet for Healthcare Providers:  IncredibleEmployment.be  This test  is no t yet approved or cleared by the [place] FDA and  has been authorized for detection and/or diagnosis of SARS-CoV-2 by FDA under an Emergency Use Authorization (EUA). This EUA will remain  in effect (meaning this test can be used) for the duration of the COVID-19 declaration under Section 564(b)(1) of the Act, 21 U.S.C.section 360bbb-3(b)(1), unless the authorization is terminated  or revoked sooner.       Influenza A by PCR NEGATIVE NEGATIVE Final   Influenza B by PCR NEGATIVE NEGATIVE Final    Comment: (NOTE) The Xpert Xpress SARS-CoV-2/FLU/RSV plus assay is intended as an aid in the diagnosis of influenza from Nasopharyngeal swab specimens and should not be used as a sole basis for treatment. Nasal washings and aspirates are unacceptable for Xpert Xpress  SARS-CoV-2/FLU/RSV testing.  Fact Sheet for Patients: EntrepreneurPulse.com.au  Fact Sheet for Healthcare Providers: IncredibleEmployment.be  This test is not yet approved or cleared by the [place] FDA and has been authorized for detection and/or diagnosis of SARS-CoV-2 by FDA under an Emergency Use Authorization (EUA). This EUA will remain in effect (meaning this test can be used) for the duration of the COVID-19 declaration under Section 564(b)(1) of the Act, 21 U.S.C. section 360bbb-3(b)(1), unless the authorization is terminated or revoked.  Performed at [facility], [place] [address], [place], [place] [address]   Culture, blood (routine x 2)     Status: None   Collection Time: 10/10/20  4:06 PM   Specimen: BLOOD RIGHT WRIST  Result Value Ref Range Status   Specimen Description BLOOD RIGHT WRIST  Final   Special Requests   Final    BOTTLES DRAWN AEROBIC AND ANAEROBIC Blood Culture results may not be optimal due to an inadequate volume of blood received in culture bottles   Culture   Final    NO GROWTH 5 DAYS Performed at [facility], [place] [address], [place], [place] [address]    Report Status 10/15/2020 FINAL  Final  MRSA Next Gen by PCR, Nasal     Status: None   Collection Time: 10/11/20  4:52 AM   Specimen: Nasal Mucosa; Nasal Swab  Result Value Ref Range Status   MRSA by PCR Next Gen NOT DETECTED NOT DETECTED Final    Comment: (NOTE) The GeneXpert MRSA Assay (FDA approved for NASAL specimens only), is one component of a comprehensive MRSA colonization surveillance program. It is not intended to diagnose MRSA infection nor to guide or monitor treatment for MRSA infections. Test performance is not FDA approved in patients less than 68 years old. Performed at [facility], [place] [address], [place], [place] [address]   Resp Panel by RT-PCR (Flu A&B, Covid) Nasopharyngeal Swab     Status: None   Collection Time: 10/15/20  3:54 PM   Specimen: Nasopharyngeal Swab; Nasopharyngeal(NP) swabs in vial transport medium  Result Value Ref Range Status   SARS Coronavirus 2 by RT PCR NEGATIVE NEGATIVE Final    Comment: (NOTE) SARS-CoV-2 target nucleic acids are NOT DETECTED.  The SARS-CoV-2 RNA is generally detectable in upper respiratory specimens during the acute phase of infection. The lowest concentration of SARS-CoV-2 viral copies this assay can detect is 138 copies/mL. A negative result does not preclude SARS-Cov-2 infection and should not be used as the sole basis for treatment or other patient management decisions. A negative result may occur with  improper specimen collection/handling, submission of specimen other than nasopharyngeal swab, presence of viral mutation(s) within the areas targeted by this assay, and inadequate number of viral copies(<138 copies/mL). A negative result must be combined with clinical observations, patient history, and epidemiological information. The expected result is Negative.  Fact Sheet for Patients:  EntrepreneurPulse.com.au  Fact Sheet for Healthcare Providers:   IncredibleEmployment.be  This test is no t yet approved or cleared by the [place] FDA and  has been authorized for detection and/or diagnosis of SARS-CoV-2 by FDA under an Emergency Use Authorization (EUA). This EUA will remain  in effect (meaning this test can be used) for the duration of the COVID-19 declaration under Section 564(b)(1) of the Act, 21 U.S.C.section 360bbb-3(b)(1), unless the authorization is terminated  or revoked sooner.       Influenza A by PCR NEGATIVE NEGATIVE Final   Influenza  B by PCR NEGATIVE NEGATIVE Final    Comment: (NOTE) The Xpert Xpress SARS-CoV-2/FLU/RSV plus assay is intended as an aid in the diagnosis of influenza from Nasopharyngeal swab specimens and should not be used as a sole basis for treatment. Nasal washings and aspirates are unacceptable for Xpert Xpress SARS-CoV-2/FLU/RSV testing.  Fact Sheet for Patients: EntrepreneurPulse.com.au  Fact Sheet for Healthcare Providers: IncredibleEmployment.be  This test is not yet approved or cleared by the [place] FDA and has been authorized for detection and/or diagnosis of SARS-CoV-2 by FDA under an Emergency Use Authorization (EUA). This EUA will remain in effect (meaning this test can be used) for the duration of the COVID-19 declaration under Section 564(b)(1) of the Act, 21 U.S.C. section 360bbb-3(b)(1), unless the authorization is terminated or revoked.  Performed at [facility], [place] [address] [place], [place] [address]   Resp Panel by RT-PCR (Flu A&B, Covid) Nasopharyngeal Swab     Status: None   Collection Time: 10/18/20  8:29 AM   Specimen: Nasopharyngeal Swab; Nasopharyngeal(NP) swabs in vial transport medium  Result Value Ref Range Status   SARS Coronavirus 2 by RT PCR NEGATIVE NEGATIVE Final    Comment: (NOTE) SARS-CoV-2 target nucleic acids are NOT DETECTED.  The SARS-CoV-2 RNA is generally detectable in  upper respiratory specimens during the acute phase of infection. The lowest concentration of SARS-CoV-2 viral copies this assay can detect is 138 copies/mL. A negative result does not preclude SARS-Cov-2 infection and should not be used as the sole basis for treatment or other patient management decisions. A negative result may occur with  improper specimen collection/handling, submission of specimen other than nasopharyngeal swab, presence of viral mutation(s) within the areas targeted by this assay, and inadequate number of viral copies(<138 copies/mL). A negative result must be combined with clinical observations, patient history, and epidemiological information. The expected result is Negative.  Fact Sheet for Patients:  EntrepreneurPulse.com.au  Fact Sheet for Healthcare Providers:  IncredibleEmployment.be  This test is no t yet approved or cleared by the [place] FDA and  has been authorized for detection and/or diagnosis of SARS-CoV-2 by FDA under an Emergency Use Authorization (EUA). This EUA will remain  in effect (meaning this test can be used) for the duration of the COVID-19 declaration under Section 564(b)(1) of the Act, 21 U.S.C.section 360bbb-3(b)(1), unless the authorization is terminated  or revoked sooner.       Influenza A by PCR NEGATIVE NEGATIVE Final   Influenza B by PCR NEGATIVE NEGATIVE Final    Comment: (NOTE) The Xpert Xpress SARS-CoV-2/FLU/RSV plus assay is intended as an aid in the diagnosis of influenza from Nasopharyngeal swab specimens and should not be used as a sole basis for treatment. Nasal washings and aspirates are unacceptable for Xpert Xpress SARS-CoV-2/FLU/RSV testing.  Fact Sheet for Patients: EntrepreneurPulse.com.au  Fact Sheet for Healthcare Providers: IncredibleEmployment.be  This test is not yet approved or cleared by the [place] FDA and has been  authorized for detection and/or diagnosis of SARS-CoV-2 by FDA under an Emergency Use Authorization (EUA). This EUA will remain in effect (meaning this test can be used) for the duration of the COVID-19 declaration under Section 564(b)(1) of the Act, 21 U.S.C. section 360bbb-3(b)(1), unless the authorization is terminated or revoked.  Performed at [facility], [place] [address] [place], [place] [address]     Labs: BNP (last 3 results) No results for input(s): BNP in the last 8760 hours. Basic Metabolic Panel: Recent Labs  Lab 10/13/20 0209 10/14/20 0112 10/17/20 0828 10/18/20 0358  NA 138 137 136 138  K 4.2 3.6 4.4 3.8  CL 105 107 106 107  CO2 '22 22 23 '$ 21*  GLUCOSE 91 115* 85 79  BUN '16 17 23 '$ 27*  CREATININE 0.84 0.77 0.77 0.79  CALCIUM 8.6* 8.3* 8.1* 8.3*  MG  --   --  1.9 1.8  PHOS  --   --  3.3 3.5    Liver Function Tests: Recent Labs  Lab 10/17/20 0828 10/18/20 0358  AST 35 20  ALT 29 25  ALKPHOS 70 65  BILITOT 2.0* 1.1  PROT 4.9* 5.0*  ALBUMIN 2.4* 2.5*    No results for input(s): LIPASE, AMYLASE in the last 168 hours. No results for input(s): AMMONIA in the last 168 hours. CBC: Recent Labs  Lab 10/13/20 0209 10/14/20 0112 10/17/20 0828 10/18/20 0358  WBC 15.0* 13.8* 10.5 9.4  NEUTROABS  --   --  8.6* 7.3  HGB 13.0 12.3 13.0 13.1  HCT 39.0 37.5 39.7 40.1  MCV 93.1 92.6 93.0 93.7  PLT 206 238 249 251    Cardiac Enzymes: No results for input(s): CKTOTAL, CKMB, CKMBINDEX, TROPONINI in the last 168 hours. BNP: Invalid input(s): POCBNP CBG: No results for input(s): GLUCAP in the last 168 hours. D-Dimer No results for input(s): DDIMER in the last 72 hours. Hgb A1c No results for input(s): HGBA1C in the last 72 hours. Lipid Profile No results for input(s): CHOL, HDL, LDLCALC, TRIG, CHOLHDL, LDLDIRECT in the last 72 hours. Thyroid function studies No results for input(s): TSH, T4TOTAL, T3FREE, THYROIDAB in the last 72 hours.  Invalid  input(s): FREET3 Anemia work up No results for input(s): VITAMINB12, FOLATE, FERRITIN, TIBC, IRON, RETICCTPCT in the last 72 hours. Urinalysis    Component Value Date/Time   COLORURINE YELLOW 10/10/2020 1645   APPEARANCEUR HAZY (A) 10/10/2020 1645   LABSPEC 1.010 10/10/2020 1645   PHURINE 5.0 10/10/2020 1645   GLUCOSEU NEGATIVE 10/10/2020 1645   GLUCOSEU NEGATIVE 08/21/2014 1027   HGBUR MODERATE (A) 10/10/2020 1645   HGBUR negative 09/25/2009 1034   BILIRUBINUR NEGATIVE 10/10/2020 1645   BILIRUBINUR Neg 05/26/2013 0827   KETONESUR NEGATIVE 10/10/2020 1645   PROTEINUR 100 (A) 10/10/2020 1645   UROBILINOGEN 0.2 08/21/2014 1027   NITRITE POSITIVE (A) 10/10/2020 1645   LEUKOCYTESUR NEGATIVE 10/10/2020 1645   Sepsis Labs Invalid input(s): PROCALCITONIN,  WBC,  LACTICIDVEN Microbiology Recent Results (from the past 240 hour(s))  Culture, blood (routine x 2)     Status: None   Collection Time: 10/10/20  3:00 PM   Specimen: BLOOD  Result Value Ref Range Status   Specimen Description BLOOD RIGHT ANTECUBITAL  Final   Special Requests   Final    BOTTLES DRAWN AEROBIC AND ANAEROBIC Blood Culture adequate volume   Culture   Final    NO GROWTH 5 DAYS Performed at [facility], [address] [place], [place] [address]    Report Status 10/15/2020 FINAL  Final  Resp Panel by RT-PCR (Flu A&B, Covid) Nasopharyngeal Swab     Status: None   Collection Time: 10/10/20  3:29 PM   Specimen: Nasopharyngeal Swab; Nasopharyngeal(NP) swabs in vial transport medium  Result Value Ref Range Status   SARS Coronavirus 2 by RT PCR NEGATIVE NEGATIVE Final    Comment: (NOTE) SARS-CoV-2 target nucleic acids are NOT DETECTED.  The SARS-CoV-2 RNA is generally detectable in upper respiratory specimens during the acute phase of infection. The lowest  concentration of SARS-CoV-2 viral copies this assay can detect is 138 copies/mL. A negative result does not preclude SARS-Cov-2 infection and should not  be used as the sole basis for treatment or other patient management decisions. A negative result may occur with  improper specimen collection/handling, submission of specimen other than nasopharyngeal swab, presence of viral mutation(s) within the areas targeted by this assay, and inadequate number of viral copies(<138 copies/mL). A negative result must be combined with clinical observations, patient history, and epidemiological information. The expected result is Negative.  Fact Sheet for Patients:  EntrepreneurPulse.com.au  Fact Sheet for Healthcare Providers:  IncredibleEmployment.be  This test is no t yet approved or cleared by the [place] FDA and  has been authorized for detection and/or diagnosis of SARS-CoV-2 by FDA under an Emergency Use Authorization (EUA). This EUA will remain  in effect (meaning this test can be used) for the duration of the COVID-19 declaration under Section 564(b)(1) of the Act, 21 U.S.C.section 360bbb-3(b)(1), unless the authorization is terminated  or revoked sooner.       Influenza A by PCR NEGATIVE NEGATIVE Final   Influenza B by PCR NEGATIVE NEGATIVE Final    Comment: (NOTE) The Xpert Xpress SARS-CoV-2/FLU/RSV plus assay is intended as an aid in the diagnosis of influenza from Nasopharyngeal swab specimens and should not be used as a sole basis for treatment. Nasal washings and aspirates are unacceptable for Xpert Xpress SARS-CoV-2/FLU/RSV testing.  Fact Sheet for Patients: EntrepreneurPulse.com.au  Fact Sheet for Healthcare Providers: IncredibleEmployment.be  This test is not yet approved or cleared by the [place] FDA and has been authorized for detection and/or diagnosis of SARS-CoV-2 by FDA under an Emergency Use Authorization (EUA). This EUA will remain in effect (meaning this test can be used) for the duration of the COVID-19 declaration under Section  564(b)(1) of the Act, 21 U.S.C. section 360bbb-3(b)(1), unless the authorization is terminated or revoked.  Performed at [facility], [place] [address], [place], [place] [address]   Culture, blood (routine x 2)     Status: None   Collection Time: 10/10/20  4:06 PM   Specimen: BLOOD RIGHT WRIST  Result Value Ref Range Status   Specimen Description BLOOD RIGHT WRIST  Final   Special Requests   Final    BOTTLES DRAWN AEROBIC AND ANAEROBIC Blood Culture results may not be optimal due to an inadequate volume of blood received in culture bottles   Culture   Final    NO GROWTH 5 DAYS Performed at [facility], [place] [address], [place], [place] [address]    Report Status 10/15/2020 FINAL  Final  MRSA Next Gen by PCR, Nasal     Status: None   Collection Time: 10/11/20  4:52 AM   Specimen: Nasal Mucosa; Nasal Swab  Result Value Ref Range Status   MRSA by PCR Next Gen NOT DETECTED NOT DETECTED Final    Comment: (NOTE) The GeneXpert MRSA Assay (FDA approved for NASAL specimens only), is one component of a comprehensive MRSA colonization surveillance program. It is not intended to diagnose MRSA infection nor to guide or monitor treatment for MRSA infections. Test performance is not FDA approved in patients less than 71 years old. Performed at [facility], [place] [address], [place], [place] [address]   Resp Panel by RT-PCR (Flu A&B, Covid) Nasopharyngeal Swab     Status: None   Collection Time: 10/15/20  3:54 PM   Specimen: Nasopharyngeal Swab; Nasopharyngeal(NP) swabs in  vial transport medium  Result Value Ref Range Status   SARS Coronavirus 2 by RT PCR NEGATIVE NEGATIVE Final    Comment: (NOTE) SARS-CoV-2 target nucleic acids are NOT DETECTED.  The SARS-CoV-2 RNA is generally detectable in upper respiratory specimens during the acute phase of infection. The lowest concentration of SARS-CoV-2 viral copies this assay can detect is 138 copies/mL. A negative result does not  preclude SARS-Cov-2 infection and should not be used as the sole basis for treatment or other patient management decisions. A negative result may occur with  improper specimen collection/handling, submission of specimen other than nasopharyngeal swab, presence of viral mutation(s) within the areas targeted by this assay, and inadequate number of viral copies(<138 copies/mL). A negative result must be combined with clinical observations, patient history, and epidemiological information. The expected result is Negative.  Fact Sheet for Patients:  EntrepreneurPulse.com.au  Fact Sheet for Healthcare Providers:  IncredibleEmployment.be  This test is no t yet approved or cleared by the [place] FDA and  has been authorized for detection and/or diagnosis of SARS-CoV-2 by FDA under an Emergency Use Authorization (EUA). This EUA will remain  in effect (meaning this test can be used) for the duration of the COVID-19 declaration under Section 564(b)(1) of the Act, 21 U.S.C.section 360bbb-3(b)(1), unless the authorization is terminated  or revoked sooner.       Influenza A by PCR NEGATIVE NEGATIVE Final   Influenza B by PCR NEGATIVE NEGATIVE Final    Comment: (NOTE) The Xpert Xpress SARS-CoV-2/FLU/RSV plus assay is intended as an aid in the diagnosis of influenza from Nasopharyngeal swab specimens and should not be used as a sole basis for treatment. Nasal washings and aspirates are unacceptable for Xpert Xpress SARS-CoV-2/FLU/RSV testing.  Fact Sheet for Patients: EntrepreneurPulse.com.au  Fact Sheet for Healthcare Providers: IncredibleEmployment.be  This test is not yet approved or cleared by the [place] FDA and has been authorized for detection and/or diagnosis of SARS-CoV-2 by FDA under an Emergency Use Authorization (EUA). This EUA will remain in effect (meaning this test can be used) for the  duration of the COVID-19 declaration under Section 564(b)(1) of the Act, 21 U.S.C. section 360bbb-3(b)(1), unless the authorization is terminated or revoked.  Performed at [facility], [place] [address], [place], [place] [id number]   Resp Panel by RT-PCR (Flu A&B, Covid) Nasopharyngeal Swab     Status: None   Collection Time: 10/18/20  8:29 AM   Specimen: Nasopharyngeal Swab; Nasopharyngeal(NP) swabs in vial transport medium  Result Value Ref Range Status   SARS Coronavirus 2 by RT PCR NEGATIVE NEGATIVE Final    Comment: (NOTE) SARS-CoV-2 target nucleic acids are NOT DETECTED.  The SARS-CoV-2 RNA is generally detectable in upper respiratory specimens during the acute phase of infection. The lowest concentration of SARS-CoV-2 viral copies this assay can detect is 138 copies/mL. A negative result does not preclude SARS-Cov-2 infection and should not be used as the sole basis for treatment or other patient management decisions. A negative result may occur with  improper specimen collection/handling, submission of specimen other than nasopharyngeal swab, presence of viral mutation(s) within the areas targeted by this assay, and inadequate number of viral copies(<138 copies/mL). A negative result must be combined with clinical observations, patient history, and epidemiological information. The expected result is Negative.  Fact Sheet for Patients:  EntrepreneurPulse.com.au  Fact Sheet for Healthcare Providers:  IncredibleEmployment.be  This test is no t yet approved or cleared by the [place] and  has been authorized for detection and/or diagnosis of SARS-CoV-2 by FDA under an Emergency Use Authorization (EUA). This EUA will remain  in effect (meaning this test can be used) for the duration of the COVID-19 declaration under Section 564(b)(1) of the Act, 21 U.S.C.section 360bbb-3(b)(1), unless the authorization is terminated  or  revoked sooner.       Influenza A by PCR NEGATIVE NEGATIVE Final   Influenza B by PCR NEGATIVE NEGATIVE Final    Comment: (NOTE) The Xpert Xpress SARS-CoV-2/FLU/RSV plus assay is intended as an aid in the diagnosis of influenza from Nasopharyngeal swab specimens and should not be used as a sole basis for treatment. Nasal washings and aspirates are unacceptable for Xpert Xpress SARS-CoV-2/FLU/RSV testing.  Fact Sheet for Patients: EntrepreneurPulse.com.au  Fact Sheet for Healthcare Providers: IncredibleEmployment.be  This test is not yet approved or cleared by the [place] FDA and has been authorized for detection and/or diagnosis of SARS-CoV-2 by FDA under an Emergency Use Authorization (EUA). This EUA will remain in effect (meaning this test can be used) for the duration of the COVID-19 declaration under Section 564(b)(1) of the Act, 21 U.S.C. section 360bbb-3(b)(1), unless the authorization is terminated or revoked.  Performed at [facility], [place] [address] [place], [place] [address]    Time coordinating discharge: 35 minutes  SIGNED:  Kerney Elbe, DO [facility] 10/19/2020, 8:22 AM Pager is on [place]  If 7PM-7AM, please contact night-coverage [email or web address]

## 2020-10-19 NOTE — Progress Notes (Signed)
  Speech Language Pathology Treatment: Dysphagia  Patient Details Name: Crystal Osborne MRN: [id number] DOB: [birth year] Today's Date: 10/19/2020 Time: [id number] SLP Time Calculation (min) (ACUTE ONLY): 18 min  Assessment / Plan / Recommendation Clinical Impression  Pt was seen for dysphagia treatment. Her meal tray was present and barely touched, but pt reported that she does not eat much, and that she did not want anything else during the session. Pt was educated regarding the results of the modified barium swallow study, diet recommendations, and swallowing precautions. Video recording of the study was used to facilitate education and pt verbalized understanding regarding all areas of education. Pt stated that she does not like straws anyway and that she does not currently mind the nectar thick liquids since it is reminiscent of hot chocolate that has melted marshmallows. Pt currently has discharge orders and she verbalized agreement with SLP services for dysphagia treatment following discharge.   HPI HPI: Pt is an 86 y.o. female who presented with confusion. CT head negative.    CXR 8/31: Small left pleural effusion with adjacent airspace disease which may reflect atelectasis or pneumonia. Pt reported to MD having difficulty swallow and being fearful of choking; pt stated at that time that she comforatbly eats mashed foods like banana, but gets scared if food has increased consistency. Discharge cancelled and SLP consulted. RN's note on 9/7 denies pt having difficulty. PMH: essential hypertension, hypertrophic cardiomyopathy, tobacco abuse, history of COPD, hyperlipidemia.      SLP Plan  Continue with current plan of care       Recommendations  Diet recommendations: Dysphagia 3 (mechanical soft);Thin liquid Liquids provided via: Cup;No straw Medication Administration: Whole meds with puree Supervision: Staff to assist with self feeding Compensations: Slow rate;Small sips/bites Postural  Changes and/or Swallow Maneuvers: Seated upright 90 degrees                Oral Care Recommendations: Oral care BID Follow up Recommendations: Skilled Nursing facility SLP Visit Diagnosis: Dysphagia, oropharyngeal phase (R13.12) Plan: Continue with current plan of care       Abigayle Wilinski I. Hardin Negus, [place], [place] Office number [phone] Pager [phone]                Horton Marshall 10/19/2020, 12:19 PM

## 2020-10-19 NOTE — Progress Notes (Signed)
Occupational Therapy Treatment Patient Details Name: Crystal Osborne MRN: [id number] DOB: 1931-02-15 Today's Date: 10/19/2020    History of present illness 87 yo admitted 8/31 with AMS and hypoxia with head CT (-). Pt with CAP. PMhx: HTN, cardiomyopathy, COPD, HLD, DM   OT comments  Patient seen by skilled OT seated on EOB for light ADLs.  Patient was able to get to eob with supervision and bathed feet and donned socks with min assist.  Patient asked to remain on eob.  Patient performed grooming with washing face and combing hair.  Static standing performed with min assist to stand.  Patient believes she is leaving today rehab. Acute OT to follow if patient remains in hospital.   Follow Up Recommendations  SNF    Equipment Recommendations  None recommended by OT    Recommendations for Other Services      Precautions / Restrictions Precautions Precautions: Fall Precaution Comments: 1 fall last week. Restrictions Weight Bearing Restrictions: No       Mobility Bed Mobility Overal bed mobility: Needs Assistance Bed Mobility: Supine to Sit;Sit to Supine     Supine to sit: Min guard;HOB elevated Sit to supine: Min assist;HOB elevated   General bed mobility comments: required assistance to scoot up in bed wih rails    Transfers       Sit to Stand: Min assist         General transfer comment: cues to push up from bed and assistance with body mechainics    Balance Overall balance assessment: Needs assistance Sitting-balance support: No upper extremity supported;Feet supported Sitting balance-Leahy Scale: Fair Sitting balance - Comments: EOB without UE support   Standing balance support: Bilateral upper extremity supported Standing balance-Leahy Scale: Fair Standing balance comment: reliant on the RW in standing                           ADL either performed or assessed with clinical judgement   ADL Overall ADL's : Needs assistance/impaired      Grooming: Wash/dry hands;Wash/dry face Grooming Details (indicate cue type and reason): Patient asked to perform seated on eob     Lower Body Bathing: Minimal assistance;Sitting/lateral leans Lower Body Bathing Details (indicate cue type and reason): to wash feet     Lower Body Dressing: Minimal assistance Lower Body Dressing Details (indicate cue type and reason): donned footwear               General ADL Comments: Patient asked to remain on eob, but did perform static standing     Vision       Perception     Praxis      Cognition Arousal/Alertness: Awake/alert Behavior During Therapy: WFL for tasks assessed/performed Overall Cognitive Status: Within Functional Limits for tasks assessed Area of Impairment: Safety/judgement                 Orientation Level: Person;Place;Time;Situation       Safety/Judgement: Decreased awareness of safety     General Comments: Patient demonstrated good orientation        Exercises     Shoulder Instructions       General Comments      Pertinent Vitals/ Pain       Pain Assessment: No/denies pain  Home Living  Prior Functioning/Environment              Frequency  Min 2X/week        Progress Toward Goals  OT Goals(current goals can now be found in the care plan section)  Progress towards OT goals: Progressing toward goals  Acute Rehab OT Goals Patient Stated Goal: to go home OT Goal Formulation: With patient Time For Goal Achievement: 10/25/20 Potential to Achieve Goals: Good ADL Goals Pt Will Perform Grooming: with modified independence;standing Pt Will Perform Upper Body Bathing: with modified independence;sitting Pt Will Perform Lower Body Bathing: with modified independence;sit to/from stand Pt Will Perform Upper Body Dressing: with modified independence;sitting Pt Will Perform Lower Body Dressing: with modified independence;sit  to/from stand Pt Will Transfer to Toilet: with modified independence;stand pivot transfer;ambulating Pt Will Perform Toileting - Clothing Manipulation and hygiene: with modified independence;sit to/from stand  Plan Discharge plan remains appropriate    Co-evaluation                 AM-PAC OT "6 Clicks" Daily Activity     Outcome Measure   Help from another person eating meals?: A Little Help from another person taking care of personal grooming?: A Little Help from another person toileting, which includes using toliet, bedpan, or urinal?: A Lot Help from another person bathing (including washing, rinsing, drying)?: A Little Help from another person to put on and taking off regular upper body clothing?: A Little Help from another person to put on and taking off regular lower body clothing?: A Little 6 Click Score: 17    End of Session Equipment Utilized During Treatment: Rolling walker  OT Visit Diagnosis: Unsteadiness on feet (R26.81)   Activity Tolerance Patient tolerated treatment well   Patient Left in bed;with call bell/phone within reach;with nursing/sitter in room   Nurse Communication Mobility status        Time: [id number] OT Time Calculation (min): 26 min  Charges: OT General Charges $OT Visit: 1 Visit OT Treatments $Self Care/Home Management : 8-22 mins $Therapeutic Activity: 8-22 mins  Crystal Osborne, OTA    Crystal Osborne 10/19/2020, 9:38 AM

## 2020-10-19 NOTE — TOC Progression Note (Signed)
Transition of Care [facility]) - Progression Note    Patient Details  Name: JULINE LUCCA MRN: [id number] Date of Birth: 01-12-1932  Transition of Care [facility]) CM/SW Contact  Joanne Chars, LCSW Phone Number: 10/19/2020, 8:14 AM  Clinical Narrative:    Candace Cruise approved at 1759 on 9/8: Plan Auth ID: [id number]. [place] [id number]. 5 days approved 9/8-9/12.   LM with pt son with update.      Expected Discharge Plan: [place] Barriers to Discharge: Continued Medical Work up, SNF Pending bed offer  Expected Discharge Plan and Services Expected Discharge Plan: [place] Choice: [place] arrangements for the past 2 months: Single Family Home Expected Discharge Date: 10/19/20                                     Social Determinants of Health (SDOH) Interventions    Readmission Risk Interventions No flowsheet data found.

## 2020-10-19 NOTE — Progress Notes (Signed)
Location:  [place] Room Number: 211 A Place of Service:  SNF (31) Provider:  Durenda Age, DNP, FNP-BC  Patient Care Team: Colon Branch, MD as PCP - General Gwenlyn Found Pearletha Forge, MD as PCP - Cardiology (Cardiology) Lorretta Harp, MD as Consulting Physician (Cardiology)  Extended Emergency Contact Information Primary Emergency Contact: Barrasso,[name] Address: [address]          [place], [place] [address] [name] Work Phone: [phone] Mobile Phone: [phone] Relation: Son  Code Status:  DNR  Goals of care: Advanced Directive information Advanced Directives 03/19/2017  Does Patient Have a Medical Advance Directive? Yes  Type of Paramedic of [place];Living will  Does patient want to make changes to medical advance directive? -  Copy of [place] in Chart? No - copy requested  Would patient like information on creating a medical advance directive? -     Chief Complaint  Patient presents with   [facility] follow up    HPI:  Pt is a 87 y.o. female seen today for medical management of chronic diseases.     Past Medical History:  Diagnosis Date   AAA (abdominal aortic aneurysm) [facility])    sees Dr Eden Lathe    B12 deficiency    Cancer of right breast [facility]) [place] 11/28/2008   sees Dr Oneida Alar    COPD (chronic obstructive pulmonary disease) ([place]) 01/25/2014   CVA (cerebrovascular accident) ([place]) 2006   Diabetes mellitus ([place]) 05/29/2010   A1c 6.0 on October 2011    Hyperlipidemia    Hypertension    Osteoporosis 2003   Pneumonia ~ 2014; ~ 2015   Prolapse of female bladder, acquired    Shingles    repeated episone 9/11 went to a UC   TIA (transient ischemic attack) ~ 2005   Transient hypothyroidism    d/t thyroidectomy   UTI (lower urinary tract infection) May 2013   Past Surgical History:  Procedure Laterality Date   ABDOMINAL  HYSTERECTOMY     no oophorectomy   CATARACT EXTRACTION W/ INTRAOCULAR LENS  IMPLANT, BILATERAL Bilateral    DILATION AND CURETTAGE OF UTERUS     MASTECTOMY, RADICAL Right 1960s   remotely    PERIPHERAL VASCULAR CATHETERIZATION  12/03/2015   PERIPHERAL VASCULAR CATHETERIZATION N/A 12/03/2015   Procedure: Abdominal Aortogram;  Surgeon: Lorretta Harp, MD;  Location: [place] CV LAB;  Service: Cardiovascular;  Laterality: N/A;   PERIPHERAL VASCULAR CATHETERIZATION Bilateral 12/03/2015   Procedure: Lower Extremity Angiography;  Surgeon: Lorretta Harp, MD;  Location: [place] CV LAB;  Service: Cardiovascular;  Laterality: Bilateral;   PERIPHERAL VASCULAR CATHETERIZATION Bilateral 12/03/2015   Procedure: Peripheral Vascular Intervention;  Surgeon: Lorretta Harp, MD;  Location: [place] CV LAB;  Service: Cardiovascular;  Laterality: Bilateral;  59mx58mm Lifestream bilateral Common Iliac Arteries   PERIPHERAL VASCULAR CATHETERIZATION Bilateral 12/03/2015   Procedure: Peripheral Vascular Atherectomy;  Surgeon: JLorretta Harp MD;  [place] [facility];  Service: Cardiovascular;  Laterality: Bilateral;  Common Iliac Arteries   THYROIDECTOMY      Allergies  Allergen Reactions   Septra [Sulfamethoxazole-Trimethoprim] Itching    Pt states she had a severe allergic reaction; she was in the ICU for 2 days because of taking this.   Ciprofloxacin Hcl Itching   Codeine Rash    Made her feel very strange    Facility-Administered Encounter Medications as  of 10/19/2020  Medication   acetaminophen (TYLENOL) tablet 650 mg   Or   acetaminophen (TYLENOL) suppository 650 mg   atorvastatin (LIPITOR) tablet 40 mg   enoxaparin (LOVENOX) injection 40 mg   hydrALAZINE (APRESOLINE) injection 10 mg   ipratropium-albuterol (DUONEB) 0.5-2.5 (3) MG/3ML nebulizer solution 3 mL   MEDLINE mouth rinse   metoprolol succinate (TOPROL-XL) 24 hr tablet 12.5 mg   nicotine (NICODERM CQ - dosed in  mg/24 hours) patch 14 mg   ondansetron (ZOFRAN) tablet 4 mg   Or   ondansetron (ZOFRAN) injection 4 mg   thiamine tablet 100 mg   Outpatient Encounter Medications as of 10/19/2020  Medication Sig   acetaminophen (TYLENOL) 500 MG tablet Take 1,000 mg by mouth 2 (two) times daily as needed (for pain.).   albuterol (VENTOLIN HFA) 108 (90 Base) MCG/ACT inhaler Inhale 2 puffs into the lungs every 6 (six) hours as needed for wheezing or shortness of breath.   ALPRAZolam (XANAX) 0.25 MG tablet Take 1-2 tablets (0.25-0.5 mg total) by mouth at bedtime as needed for sleep.   AMBULATORY NON FORMULARY MEDICATION Right Breast Prosthesis  Dx: [id number]   aspirin EC 81 MG tablet Take 81 mg by mouth every evening.   atorvastatin (LIPITOR) 40 MG tablet Take 1 tablet (40 mg total) by mouth daily. (Patient taking differently: Take 40 mg by mouth at bedtime.)   metoprolol succinate (TOPROL XL) 25 MG 24 hr tablet Take 0.5 tablets (12.5 mg total) by mouth daily.   POTASSIUM PO Take 1 tablet by mouth daily. OTC   psyllium (METAMUCIL SMOOTH TEXTURE) 28 % packet Take 1 packet by mouth 2 (two) times daily as needed (constipation).    Review of Systems  GENERAL: No change in appetite, no fatigue, no weight changes, no fever, chills or weakness SKIN: Denies rash, itching, wounds, ulcer sores, or nail abnormalities EYES: Denies change in vision, dry eyes, eye pain, itching or discharge EARS: Denies change in hearing, ringing in ears, or earache NOSE: Denies nasal congestion or epistaxis MOUTH and THROAT: Denies oral discomfort, gingival pain or bleeding, pain from teeth or hoarseness   RESPIRATORY: no cough, SOB, DOE, wheezing, hemoptysis CARDIAC: No chest pain, edema or palpitations GI: No abdominal pain, diarrhea, constipation, heart burn, nausea or vomiting GU: Denies dysuria, frequency, hematuria, incontinence, or discharge MUSCULOSKELETAL: Denies joint pain, muscle pain, back pain, restricted movement, or unusual  weakness CIRCULATION: Denies claudication, edema of legs, varicosities, or cold extremities NEUROLOGICAL: Denies dizziness, syncope, numbness, or headache PSYCHIATRIC: Denies feelings of depression or anxiety. No report of hallucinations, insomnia, paranoia, or agitation ENDOCRINE: Denies polyphagia, polyuria, polydipsia, heat or cold intolerance HEME/LYMPH: Denies excessive bruising, petechia, enlarged lymph nodes, or bleeding problems IMMUNOLOGIC: Denies history of frequent infections, AIDS, or use of immunosuppressive agents   Immunization History  Administered Date(s) Administered   Influenza Split 12/18/2010, 11/07/2013   Influenza Whole 12/08/2006, 12/15/2007, 11/14/2008, 11/03/2012   Influenza, High Dose Seasonal PF 11/06/2014, 10/29/2015   Influenza-Unspecified 10/10/2016, 11/12/2017, 11/23/2018, 11/25/2019   PFIZER(Purple Top)SARS-COV-2 Vaccination 02/25/2019, 03/18/2019   Pneumococcal Conjugate-13 09/18/2016   Pneumococcal Polysaccharide-23 02/10/1998, 01/14/2019   Td 02/10/1997, 04/18/2008   Zoster, Live 04/18/2008, 09/17/2010   Pertinent  Health Maintenance Due  Topic Date Due   DEXA SCAN  Never done   INFLUENZA VACCINE  09/10/2020   PNA vac Low Risk Adult  Completed   Fall Risk  10/04/2020 05/21/2020 01/14/2019 11/04/2018 03/19/2017  Falls in the past year? 0 0 0 0 No  Number  falls in past yr: - 0 - - -  Injury with Fall? - 0 - - -  Follow up - - Falls evaluation completed Falls evaluation completed -     There were no vitals filed for this visit. There is no height or weight on file to calculate BMI.  Physical Exam  GENERAL APPEARANCE: Well nourished. In no acute distress. Normal body habitus SKIN:  Skin is warm and dry. There are no suspicious lesions or rash HEAD: Normal in size and contour. No evidence of trauma EYES: Lids open and close normally. No blepharitis, entropion or ectropion. PERRL. Conjunctivae are clear and sclerae are white. Lenses are without  opacity EARS: Pinnae are normal. Patient hears normal voice tunes of the examiner MOUTH and THROAT: Lips are without lesions. Oral mucosa is moist and without lesions. Tongue is normal in shape, size, and color and without lesions NECK: supple, trachea midline, no neck masses, no thyroid tenderness, no thyromegaly LYMPHATICS: No LAN in the neck, no supraclavicular LAN RESPIRATORY: Breathing is even & unlabored, BS CTAB CARDIAC: RRR, no murmur,no extra heart sounds, no edema GI: Abdomen soft, normal BS, no masses, no tenderness, no hepatomegaly, no splenomegaly MUSCULOSKELETAL: No deformities. Movement at each extremity is full and painless. Strength is 5/5 at each extremity. Back is without kyphosis or scoliosis CIRCULATION: Pedal pulses are 2+. There is no edema of the legs, ankles and feet NEUROLOGICAL: There is no tremor. Speech is clear PSYCHIATRIC: Alert and oriented X 3. Affect and behavior are appropriate  Labs reviewed: Recent Labs    09/11/20 1626 09/26/20 2112 10/14/20 0112 10/17/20 0828 10/18/20 0358  NA 138   < > 137 136 138  K 4.8   < > 3.6 4.4 3.8  CL 104   < > 107 106 107  CO2 24   < > 22 23 21*  GLUCOSE 88   < > 115* 85 79  BUN 19   < > 17 23 27*  CREATININE 1.05   < > 0.77 0.77 0.79  CALCIUM 9.2   < > 8.3* 8.1* 8.3*  MG 1.9  --   --  1.9 1.8  PHOS  --   --   --  3.3 3.5   < > = values in this interval not displayed.   Recent Labs    10/12/20 0358 10/17/20 0828 10/18/20 0358  AST 58* 35 20  ALT 110* 29 25  ALKPHOS 97 70 65  BILITOT 0.7 2.0* 1.1  PROT 5.5* 4.9* 5.0*  ALBUMIN 2.8* 2.4* 2.5*   Recent Labs    10/10/20 1524 10/11/20 0042 10/14/20 0112 10/17/20 0828 10/18/20 0358  WBC 12.9*   < > 13.8* 10.5 9.4  NEUTROABS 10.1*  --   --  8.6* 7.3  HGB 14.1   < > 12.3 13.0 13.1  HCT 43.2   < > 37.5 39.7 40.1  MCV 93.9   < > 92.6 93.0 93.7  PLT 238   < > 238 249 251   < > = values in this interval not displayed.   Lab Results  Component Value Date    TSH 1.765 10/11/2020   Lab Results  Component Value Date   HGBA1C 5.7 05/21/2020   Lab Results  Component Value Date   CHOL 134 10/24/2019   HDL 51 10/24/2019   LDLCALC 63 10/24/2019   LDLDIRECT 149.2 08/03/2006   TRIG 117 10/24/2019   CHOLHDL 2.6 10/24/2019    Significant Diagnostic Results in last 30  days:  DG Chest 1 View  Result Date: 10/10/2020 CLINICAL DATA:  Altered mental status, shortness of breath EXAM: CHEST  1 VIEW COMPARISON:  Chest radiograph 11/09/2015 FINDINGS: The heart is enlarged. The mediastinal contours are within normal limits. There is calcified atherosclerotic plaque of the aortic arch. There are coarsened interstitial markings throughout both lungs, likely chronic. There is a small left pleural effusion with adjacent airspace disease. The right costophrenic angle is cut off. There is no overt pulmonary edema. There is no pneumothorax. There is no acute osseous abnormality. IMPRESSION: 1. Small left pleural effusion with adjacent airspace disease which may reflect atelectasis or pneumonia. 2. Mild cardiomegaly. Electronically Signed   By: Valetta Mole M.D.   On: 10/10/2020 16:01   CT Head Wo Contrast  Result Date: 10/10/2020 CLINICAL DATA:  Mental status change EXAM: CT HEAD WITHOUT CONTRAST TECHNIQUE: Contiguous axial images were obtained from the base of the skull through the vertex without intravenous contrast. COMPARISON:  CT 07/20/2016, MRI 09/22/2020 FINDINGS: Brain: Motion degradation. No acute territorial infarction, hemorrhage or intracranial mass. Moderate severe chronic small vessel ischemic changes of the white matter. Chronic lacunar infarct within the left white matter and basal ganglia. Chronic left cerebellar infarct. Stable ventricle size. Atrophy Vascular: No hyperdense vessels. Carotid vascular and vertebral calcification Skull: Normal. Negative for fracture or focal lesion. Sinuses/Orbits: No acute finding. Other: None IMPRESSION: 1. Motion  degraded study. 2. No definite CT evidence for acute intracranial abnormality. Atrophy and chronic small vessel ischemic changes of the white matter. Electronically Signed   By: Donavan Foil M.D.   On: 10/10/2020 19:42   MR Brain Wo Contrast  Result Date: 09/23/2020 CLINICAL DATA:  Dizziness, persistent/recurrent, cardiac or vascular cause suspected. Ataxia 2-3 months. EXAM: MRI HEAD WITHOUT CONTRAST TECHNIQUE: Multiplanar, multiecho pulse sequences of the brain and surrounding structures were obtained without intravenous contrast. COMPARISON:  Head CT 07/20/2016.  MRI 11/16/2005. FINDINGS: Brain: Diffusion imaging does not show any acute or subacute infarction. No focal abnormality affects the brainstem. Old small vessel infarction within the left cerebellum. Cerebral hemispheres show moderate chronic small-vessel ischemic changes throughout the white matter. No cortical or large vessel territory infarction. No mass lesion, hemorrhage, hydrocephalus or extra-axial collection. Findings are progressive since 2007 as expected. Vascular: Major vessels at the base of the brain show flow. Skull and upper cervical spine: Negative Sinuses/Orbits: Clear/normal Other: None IMPRESSION: No acute or reversible finding. Old left cerebellar small vessel infarction. Moderate chronic small-vessel ischemic changes of the cerebral hemispheric white matter, progressive since 2007. Electronically Signed   By: Nelson Chimes M.D.   On: 09/23/2020 15:35   DG CHEST PORT 1 VIEW  Result Date: 10/18/2020 CLINICAL DATA:  SOB (shortness of breath) hx copd EXAM: PORTABLE CHEST - 1 VIEW COMPARISON:  10/10/2020 FINDINGS: Interval improvement in interstitial edema. Persistent left retrocardiac consolidation/atelectasis. No new airspace disease. Heart size and mediastinal contours are within normal limits. Aortic Atherosclerosis (ICD10-170.0). Small bilateral pleural effusions. Visualized bones unremarkable. IMPRESSION: Improving interstitial  edema or infiltrates, with persistent small pleural effusions. Electronically Signed   By: Lucrezia Europe M.D.   On: 10/18/2020 09:25   DG Shoulder Right Port  Result Date: 10/13/2020 CLINICAL DATA:  Right shoulder pain. EXAM: PORTABLE RIGHT SHOULDER COMPARISON:  None. FINDINGS: No acute fracture or dislocation identified. Density following the course of the lateral humeral head may relate to prior surgery/rotator cuff repair. Mild degenerative disease of the [facility] joint. No bony lesions or destruction. Soft tissues are unremarkable.  IMPRESSION: No acute findings. Density following the lateral humeral head may relate to prior rotator cuff repair. Electronically Signed   By: Aletta Edouard M.D.   On: 10/13/2020 14:00   DG Swallowing Func-Speech Pathology  Result Date: 10/18/2020 Table formatting from the original result was not included. Objective Swallowing Evaluation: Type of Study: MBS-Modified Barium Swallow Study  Patient Details Name: KAREL GOULET MRN: [id number] Date of Birth: 1931/08/05 Today's Date: 10/18/2020 Time: SLP Start Time (ACUTE ONLY): 1008 -SLP Stop Time (ACUTE ONLY): [id number] SLP Time Calculation (min) (ACUTE ONLY): 11 min Past Medical History: Past Medical History: Diagnosis Date  AAA (abdominal aortic aneurysm) ([place])   sees Dr Eden Lathe   B12 deficiency   Cancer of right breast [facility]) [place] 11/28/2008  sees Dr Oneida Alar   COPD (chronic obstructive pulmonary disease) ([place]) 01/25/2014  CVA (cerebrovascular accident) ([place]) 2006  Diabetes mellitus ([place]) 05/29/2010  A1c 6.0 on October 2011   Hyperlipidemia   Hypertension   Osteoporosis 2003  Pneumonia ~ 2014; ~ 2015  Prolapse of female bladder, acquired   Shingles   repeated episone 9/11 went to a UC  TIA (transient ischemic attack) ~ 2005  Transient hypothyroidism   d/t thyroidectomy  UTI (lower urinary tract infection) May 2013 Past Surgical History: Past Surgical History: Procedure Laterality Date  ABDOMINAL HYSTERECTOMY    no oophorectomy   CATARACT EXTRACTION W/ INTRAOCULAR LENS  IMPLANT, BILATERAL Bilateral   DILATION AND CURETTAGE OF UTERUS    MASTECTOMY, RADICAL Right 1960s  remotely   PERIPHERAL VASCULAR CATHETERIZATION  12/03/2015  PERIPHERAL VASCULAR CATHETERIZATION N/A 12/03/2015  Procedure: Abdominal Aortogram;  Surgeon: Lorretta Harp, MD;  Location: [place] CV LAB;  Service: Cardiovascular;  Laterality: N/A;  PERIPHERAL VASCULAR CATHETERIZATION Bilateral 12/03/2015  Procedure: Lower Extremity Angiography;  Surgeon: Lorretta Harp, MD;  Location: [place] [facility] LAB;  Service: Cardiovascular;  Laterality: Bilateral;  PERIPHERAL VASCULAR CATHETERIZATION Bilateral 12/03/2015  Procedure: Peripheral Vascular Intervention;  Surgeon: Lorretta Harp, MD;  Location: [place] [facility] LAB;  Service: Cardiovascular;  Laterality: Bilateral;  62mx58mm Lifestream bilateral Common Iliac Arteries  PERIPHERAL VASCULAR CATHETERIZATION Bilateral 12/03/2015  Procedure: Peripheral Vascular Atherectomy;  Surgeon: JLorretta Harp MD;  [facility] [place];  Service: Cardiovascular;  Laterality: Bilateral;  Common Iliac Arteries  THYROIDECTOMY   HPI: Pt is an 87y.o. female who presented with confusion. CT head negative.    CXR 8/31: Small left pleural effusion with adjacent airspace disease which may reflect atelectasis or pneumonia. Pt reported to MD having difficulty swallow and being fearful of choking; pt stated at that time that she comforatbly eats mashed foods like banana, but gets scared if food has increased consistency. Discharge cancelled and SLP consulted. RN's note on 9/7 denies pt having difficulty. PMH: essential hypertension, hypertrophic cardiomyopathy, tobacco abuse, history of COPD, hyperlipidemia.  No data recorded Assessment / Plan / Recommendation CHL IP CLINICAL IMPRESSIONS 10/18/2020 Clinical Impression Pt presents with oropharyngeal dysphagia characterized by impaired bolus cohesion, a pharyngeal delay, reduced pharyngeal  constriction, and reduced anterior laryngeal movement. She demonstrated premature spillage to the pyriform sinuses, posterior pharyngeal wall residue, and mild pyriform sinus residue. Penetration (PAS 2) was noted with consecutive swallows of nectar thick liquids and aspiration (PAS 7) was observed once when a pill was taken with nectar thick liquids via straw. Penetration (PAS 3,5) and silent aspiration (PAS 8) were noted with thin liquids via cup. Laryngeal invasion was secondary to the pharyngeal delay and only  larger amounts of aspiration triggered coughing, but this did not facilitate expulsion of the aspirate. A chin tuck posture and effortful swallows were ineffective in eliminating laryngeal invasion, but an effortful swallow did improve pharyngeal clearance. A dysphagia 3 diet with nectar thick liquids is recommended at this time. SLP will follow for treatment. SLP Visit Diagnosis Dysphagia, oropharyngeal phase (R13.12) Attention and concentration deficit following -- Frontal lobe and executive function deficit following -- Impact on safety and function Mild aspiration risk   CHL IP TREATMENT RECOMMENDATION 10/18/2020 Treatment Recommendations Therapy as outlined in treatment plan below   Prognosis 10/18/2020 Prognosis for Safe Diet Advancement Good Barriers to Reach Goals -- Barriers/Prognosis Comment -- CHL IP DIET RECOMMENDATION 10/18/2020 SLP Diet Recommendations Dysphagia 3 (Mech soft) solids;Nectar thick liquid Liquid Administration via Cup;No straw Medication Administration Whole meds with puree Compensations Slow rate;Small sips/bites Postural Changes Seated upright at 90 degrees   CHL IP OTHER RECOMMENDATIONS 10/18/2020 Recommended Consults -- Oral Care Recommendations Oral care BID Other Recommendations --   CHL IP FOLLOW UP RECOMMENDATIONS 10/18/2020 Follow up Recommendations Skilled Nursing facility   [facility] IP FREQUENCY AND DURATION 10/18/2020 Speech Therapy Frequency (ACUTE ONLY) min 2x/week Treatment  Duration 2 weeks      CHL IP ORAL PHASE 10/18/2020 Oral Phase Impaired Oral - Pudding Teaspoon -- Oral - Pudding Cup -- Oral - Honey Teaspoon -- Oral - Honey Cup -- Oral - Nectar Teaspoon -- Oral - Nectar Cup Decreased bolus cohesion;Premature spillage Oral - Nectar Straw Decreased bolus cohesion;Premature spillage Oral - Thin Teaspoon -- Oral - Thin Cup Decreased bolus cohesion;Premature spillage Oral - Thin Straw Decreased velopharyngeal closure Oral - Puree Decreased bolus cohesion;Premature spillage Oral - Mech Soft -- Oral - Regular Impaired mastication Oral - Multi-Consistency -- Oral - Pill Decreased bolus cohesion;Premature spillage Oral Phase - Comment --  CHL IP PHARYNGEAL PHASE 10/18/2020 Pharyngeal Phase Impaired Pharyngeal- Pudding Teaspoon -- Pharyngeal -- Pharyngeal- Pudding Cup -- Pharyngeal -- Pharyngeal- Honey Teaspoon -- Pharyngeal -- Pharyngeal- Honey Cup -- Pharyngeal -- Pharyngeal- Nectar Teaspoon -- Pharyngeal -- Pharyngeal- Nectar Cup Delayed swallow initiation-pyriform sinuses;Reduced pharyngeal peristalsis;Pharyngeal residue - pyriform;Pharyngeal residue - posterior pharnyx;Penetration/Aspiration during swallow;Reduced anterior laryngeal mobility Pharyngeal Material enters airway, remains ABOVE vocal cords then ejected out Pharyngeal- Nectar Straw Delayed swallow initiation-pyriform sinuses;Reduced pharyngeal peristalsis;Pharyngeal residue - pyriform;Pharyngeal residue - posterior pharnyx;Penetration/Aspiration during swallow;Reduced anterior laryngeal mobility Pharyngeal Material enters airway, remains ABOVE vocal cords then ejected out;Material enters airway, passes BELOW cords and not ejected out despite cough attempt by patient Pharyngeal- Thin Teaspoon -- Pharyngeal -- Pharyngeal- Thin Cup Delayed swallow initiation-pyriform sinuses;Reduced pharyngeal peristalsis;Pharyngeal residue - pyriform;Pharyngeal residue - posterior pharnyx;Penetration/Aspiration during swallow;Reduced anterior  laryngeal mobility Pharyngeal Material enters airway, remains ABOVE vocal cords and not ejected out;Material enters airway, CONTACTS cords and not ejected out;Material enters airway, passes BELOW cords without attempt by patient to eject out (silent aspiration) Pharyngeal- Thin Straw -- Pharyngeal -- Pharyngeal- Puree Delayed swallow initiation-pyriform sinuses;Reduced pharyngeal peristalsis;Pharyngeal residue - pyriform;Pharyngeal residue - posterior pharnyx;Penetration/Aspiration during swallow;Reduced anterior laryngeal mobility Pharyngeal -- Pharyngeal- Mechanical Soft -- Pharyngeal -- Pharyngeal- Regular Delayed swallow initiation-pyriform sinuses;Reduced pharyngeal peristalsis;Pharyngeal residue - pyriform;Pharyngeal residue - posterior pharnyx;Reduced anterior laryngeal mobility Pharyngeal -- Pharyngeal- Multi-consistency -- Pharyngeal -- Pharyngeal- Pill Delayed swallow initiation-pyriform sinuses;Reduced pharyngeal peristalsis;Pharyngeal residue - pyriform;Pharyngeal residue - posterior pharnyx;Reduced anterior laryngeal mobility Pharyngeal -- Pharyngeal Comment --  CHL IP CERVICAL ESOPHAGEAL PHASE 10/18/2020 Cervical Esophageal Phase WFL Pudding Teaspoon -- Pudding Cup -- Honey Teaspoon -- Honey Cup -- Nectar Teaspoon --  Nectar Cup -- [employer] -- Thin Teaspoon -- Thin Cup -- Thin Straw -- Puree -- Mechanical Soft -- Regular -- Multi-consistency -- Pill -- Cervical Esophageal Comment -- Shanika I. Hardin Negus, [place], [place] Office number [phone] Pager [phone] [place] 10/18/2020, 11:01 AM              ECHOCARDIOGRAM COMPLETE  Result Date: 10/01/2020    ECHOCARDIOGRAM REPORT   Patient Name:   EVIENNE HIBBERT Date of Exam: 10/01/2020 Medical Rec #:  [id number]     Height:       65.0 in Accession #:    [id number]    Weight:       109.3 lb Date of Birth:  [birth year]     BSA:          1.530 m Patient Age:    83 years      BP:           168/82 mmHg Patient Gender: F              HR:           43 bpm. Exam Location:  [place] [facility] Procedure: 2D Echo, Intracardiac Opacification Agent, Cardiac Doppler and Color            Doppler Indications:    R55 Pre-Syncope  History:        Patient has prior history of Echocardiogram examinations, most                 recent 09/25/2016. CAD, PAD, COPD and TIA, Arrythmias:WPW; Risk                 Factors:Diabetes and HLD.  Sonographer:    Marygrace Drought RCS Referring Phys: [id number] [place]  1. Significant hypertrophy of LV apex with slit like cavity of LV apex. Frequent ectopy/bigeminy, makes assessment of LVEF difficult OVerall LVEF appears mildly decreased.. The left ventricular internal cavity size was mildly dilated.  2. Right ventricular systolic function is low normal. The right ventricular size is normal. There is mildly elevated pulmonary artery systolic pressure.  3. Left atrial size was mildly dilated.  4. Mild mitral valve regurgitation.  5. Tricuspid valve regurgitation is mild to moderate.  6. The aortic valve is tricuspid. Aortic valve regurgitation is not visualized. Mild to moderate aortic valve sclerosis/calcification is present, without any evidence of aortic stenosis.  7. The inferior vena cava is normal in size with greater than 50% respiratory variability, suggesting right atrial pressure of 3 mmHg. FINDINGS  Left Ventricle: Significant hypertrophy of LV apex with slit like cavity of LV apex. Frequent ectopy/bigeminy, makes assessment of LVEF difficult OVerall LVEF appears mildly decreased. The left ventricular internal cavity size was mildly dilated. There is apical left ventricular hypertrophy. Right Ventricle: The right ventricular size is normal. Right vetricular wall thickness was not assessed. Right ventricular systolic function is low normal. There is mildly elevated pulmonary artery systolic pressure. The tricuspid regurgitant velocity is  3.18 m/s, and with an assumed right atrial pressure of 3  mmHg, the estimated right ventricular systolic pressure is [id number] mmHg. Left Atrium: Left atrial size was mildly dilated. Right Atrium: Right atrial size was normal in size. Pericardium: There is no evidence of pericardial effusion. Mitral Valve: There is mild thickening of the mitral valve leaflet(s). Mild to moderate mitral annular calcification. Mild mitral valve regurgitation. Tricuspid Valve: The tricuspid valve is normal in structure. Tricuspid valve regurgitation is mild to moderate.  Aortic Valve: The aortic valve is tricuspid. Aortic valve regurgitation is not visualized. Mild to moderate aortic valve sclerosis/calcification is present, without any evidence of aortic stenosis. Pulmonic Valve: The pulmonic valve was normal in structure. Pulmonic valve regurgitation is trivial. Aorta: The aortic root and ascending aorta are structurally normal, with no evidence of dilitation. Venous: The inferior vena cava is normal in size with greater than 50% respiratory variability, suggesting right atrial pressure of 3 mmHg. IAS/Shunts: No atrial level shunt detected by color flow Doppler.  LEFT VENTRICLE PLAX 2D LVIDd:         5.30 cm  Diastology LVIDs:         4.30 cm  LV e' medial:    4.79 cm/s LV PW:         1.00 cm  LV E/e' medial:  23.0 LV IVS:        1.00 cm  LV e' lateral:   9.14 cm/s LVOT diam:     1.80 cm  LV E/e' lateral: 12.0 LV SV:         53 LV SV Index:   35 LVOT Area:     2.54 cm  RIGHT VENTRICLE RV Basal diam:  4.00 cm RVSP:           43.4 mmHg LEFT ATRIUM             Index       RIGHT ATRIUM           Index LA diam:        3.10 cm 2.03 cm/m  RA Pressure: 3.00 mmHg LA Vol (A2C):   54.5 ml 35.61 ml/m RA Area:     14.70 cm LA Vol (A4C):   54.7 ml 35.74 ml/m RA Volume:   37.40 ml  24.44 ml/m LA Biplane Vol: 56.3 ml 36.79 ml/m  AORTIC VALVE LVOT Vmax:   89.40 cm/s LVOT Vmean:  63.400 cm/s LVOT VTI:    0.208 m  AORTA Ao Root diam: 3.00 cm Ao Asc diam:  2.90 cm MITRAL VALVE                TRICUSPID VALVE  MV Area (PHT):              TR Peak grad:   40.4 mmHg MV Decel Time:              TR Vmax:        318.00 cm/s MR Peak grad: 135.5 mmHg    Estimated RAP:  3.00 mmHg MR Mean grad: 93.0 mmHg     RVSP:           43.4 mmHg MR Vmax:      582.00 cm/s MR Vmean:     458.0 cm/s    SHUNTS MV E velocity: 110.00 cm/s  Systemic VTI:  0.21 m MV A velocity: 48.00 cm/s   Systemic Diam: 1.80 cm MV E/A ratio:  2.29 Dorris Carnes MD Electronically signed by Dorris Carnes MD Signature Date/Time: 10/01/2020/10:11:08 PM    Final    LONG TERM MONITOR (3-14 DAYS)  Result Date: 10/02/2020  Frequent PVCs (15% of beats)  7 episodes of SVT, longest lasting 14 seconds with average rate 107 bpm  1 episode of NSVT lasting 8 beats  Patch Wear Time:  3 days and 3 hours (2022-08-07T09:07:09-398 to 2022-08-10T13:04:10-398) Patient had a min HR of 52 bpm, max HR of 182 bpm, and avg HR of 70 bpm. Predominant underlying rhythm was Sinus Rhythm. 1  run of Ventricular Tachycardia occurred lasting 8 beats with a max rate of 141 bpm (avg 131 bpm). 7 Supraventricular Tachycardia runs occurred, the run with the fastest interval lasting 7 beats with a max rate of 182 bpm, the longest lasting 13.6 secs with an avg rate of 107 bpm. Isolated SVEs were rare (<1.0%), SVE Couplets were rare (<1.0%), and SVE Triplets were rare (<1.0%). Isolated VEs were frequent (15.2%, [id number]), VE Couplets were rare (<1.0%, 564), and VE Triplets were rare (<1.0%, 66). Ventricular Bigeminy and Trigeminy were present.  6 patient triggered events, corresponding to sinus rhythm  PACs/PVCs and ventricular bigeminy.    Assessment/Plan    Family/ staff Communication:   Labs/tests ordered:    Goals of care:      Durenda Age, DNP, MSN, FNP-BC [facility] [phone] (Monday-Friday 8:00 a.m. - 5:00 p.m.) [phone] (after hours)

## 2020-10-23 ENCOUNTER — Encounter: Payer: Self-pay | Admitting: Internal Medicine

## 2020-10-23 ENCOUNTER — Non-Acute Institutional Stay (SKILLED_NURSING_FACILITY): Payer: Medicare Other | Admitting: Internal Medicine

## 2020-10-23 DIAGNOSIS — F17200 Nicotine dependence, unspecified, uncomplicated: Secondary | ICD-10-CM

## 2020-10-23 DIAGNOSIS — J189 Pneumonia, unspecified organism: Secondary | ICD-10-CM

## 2020-10-23 DIAGNOSIS — G9341 Metabolic encephalopathy: Secondary | ICD-10-CM

## 2020-10-23 DIAGNOSIS — E890 Postprocedural hypothyroidism: Secondary | ICD-10-CM

## 2020-10-23 NOTE — Assessment & Plan Note (Addendum)
10/11/2020 TSH 1.765 despite profound lethargy.

## 2020-10-23 NOTE — Assessment & Plan Note (Addendum)
33.5-pack-year history of smoking.  Smoking cessation counseling provided in the hospital along with initiation of nicotine patch.

## 2020-10-23 NOTE — Assessment & Plan Note (Signed)
She is profoundly lethargic and difficult to arouse.  She falls asleep after a few words.  It will be verified that she is not on alprazolam 0.25 mg 1-2 pills at bedtime asleep as this is contraindicated.

## 2020-10-23 NOTE — Progress Notes (Signed)
NURSING HOME LOCATION:  [facility] ROOM NUMBER:  [id number]  CODE STATUS:  DNR  PCP:  Kathlene November MD  This is a comprehensive admission note to this SNFperformed on this date less than 30 days from date of admission. Included are preadmission medical/surgical history; reconciled medication list; family history; social history and comprehensive review of systems.  Corrections and additions to the records were documented. Comprehensive physical exam was also performed. Additionally a clinical summary was entered for each active diagnosis pertinent to this admission in the Problem List to enhance continuity of care.  HPI: She was hospitalized 8/31 - 10/19/2020 , admitted from home with acute mental status changes associated with agitation.  According to her son she been experiencing shoulder discomfort for approximately 2 weeks PTA for which she was prescribed tramadol.  Imaging revealed no acute findings. In the ED mild tachycardia was present; initial O2 sats were 96% on 2 L but she desaturated into the 80% + range.  Nonrebreather was initiated.  Chest x-ray was of concern for possible left-sided pneumonia.  Initial white count was 12,900.  She was diagnosed with acute metabolic encephalopathy/acute delirium in the context of community-acquired pneumonia with acute hypoxic respiratory failure.  Broad-spectrum antibiotics with ceftriaxone and azithromycin were initiated with clinical improvement. Course was complicated by onset of the tremor possibly related to Haldol which was discontinued with resolution of the tremor. She was switched to doxycycline for a full 7-day course. The day prior to discharge she complained of some dysphagia.  Discharge was canceled and speech therapy evaluated the patient.  MBS was completed 9/8; dysphagia 3 diet with nectar thick liquids was recommended. She did exhibit elevated LFTs attributed to the acute illness; at discharge AST was 20 and ALT 25.   Hyperbilirubinemia was felt to be reactive.  The total bilirubin had peaked at 1.1.  Past medical and surgical history: Includes history of abdominal aortic aneurysm, history of B12 deficiency, history of breast cancer, CAD, COPD, history of stroke, diabetes with neurovascular complications, essential hypertension, dyslipidemia, hypertrophic cardiomyopathy, and history of osteoporosis.  Surgeries and procedures include abdominal hysterectomy, right radical mastectomy, thyroidectomy, and multiple peripheral vascular interventions.  Social history: Nondrinker; active smoker PTA with almost 34-pack-year history.  Smoking cessation counseling was provided while hospitalized.  Nicotine patch was initiated.  Family history: Noncontributory due to advanced age   Review of systems: Lethargy prevented ROS completion.  She was difficult to arouse and after few words would fall back asleep.  The med list includes alprazolam 0.25 mg 1-2 nightly at bedtime as needed for rest.  The nurse reviewed the AHT med list and alprazolam is not included. She did tell me that she was "tired".  She knew that she been in the hospital because of pneumonia.  She seemed to indicate that her breathing was stable.  Physical exam:  Pertinent or positive findings: As noted she is markedly lethargic.  Hair is disheveled.  Facies are weathered.  When she would open her eyes ptosis was present bilaterally.  Anisocoria was suggested with the right pupil larger than the left.  Her speech is weak and slurred.  The upper teeth are immaculate and bright white. The mandible is edentulous.  A grade 1/2 systolic murmur is present at the base.  Breath sounds are decreased.  The xiphoid is significantly enlarged & easily palpated. Pedal pulses are decreased.  She has clubbing of the nailbeds.  Limbs are thin and interosseous wasting is present.  She  has scattered bruising over the forearms.  General appearance: no acute distress, increased work  of breathing is present.   Lymphatic: No lymphadenopathy about the head, neck, axilla. Eyes: No conjunctival inflammation or lid edema is present. There is no scleral icterus. Ears:  External ear exam shows no significant lesions or deformities.   Nose:  External nasal examination shows no deformity or inflammation. Nasal mucosa are pink and moist without lesions, exudates Oral exam: Lips and gums are healthy appearing.There is no oropharyngeal erythema or exudate. Neck:  No thyromegaly, masses, tenderness noted.    Heart:  Normal rate and regular rhythm. S1 and S2 normal without gallop, click, rub.  Lungs:  without wheezes, rhonchi, rales, rubs. Abdomen: Bowel sounds are normal.  Abdomen is soft and nontender with no organomegaly, hernias, masses. GU: Deferred  Extremities:  No cyanosis, edema. Neurologic exam:Balance, Rhomberg, finger to nose testing could not be completed due to clinical state Skin: Warm & dry w/o tenting. No significant  rash.  See clinical summary under each active problem in the Problem List with associated updated therapeutic plan

## 2020-10-23 NOTE — Assessment & Plan Note (Addendum)
Denies acute pulmonary symptoms @ present. O2 sats good.

## 2020-10-23 NOTE — Progress Notes (Signed)
This encounter was created in error - please disregard.

## 2020-10-23 NOTE — Patient Instructions (Signed)
See assessment and plan under each diagnosis in the problem list and acutely for this visit 

## 2020-10-29 ENCOUNTER — Other Ambulatory Visit: Payer: Self-pay | Admitting: Internal Medicine

## 2020-11-02 ENCOUNTER — Encounter: Payer: Self-pay | Admitting: Adult Health

## 2020-11-02 ENCOUNTER — Non-Acute Institutional Stay (SKILLED_NURSING_FACILITY): Payer: Medicare Other | Admitting: Adult Health

## 2020-11-02 DIAGNOSIS — J189 Pneumonia, unspecified organism: Secondary | ICD-10-CM

## 2020-11-02 DIAGNOSIS — F17200 Nicotine dependence, unspecified, uncomplicated: Secondary | ICD-10-CM | POA: Diagnosis not present

## 2020-11-02 DIAGNOSIS — J449 Chronic obstructive pulmonary disease, unspecified: Secondary | ICD-10-CM

## 2020-11-02 DIAGNOSIS — I10 Essential (primary) hypertension: Secondary | ICD-10-CM | POA: Diagnosis not present

## 2020-11-02 DIAGNOSIS — E782 Mixed hyperlipidemia: Secondary | ICD-10-CM

## 2020-11-02 DIAGNOSIS — R1312 Dysphagia, oropharyngeal phase: Secondary | ICD-10-CM | POA: Diagnosis not present

## 2020-11-02 NOTE — Progress Notes (Signed)
Location:  [facility] Room Number: 211-A Place of Service:  SNF (31) Provider:  Durenda Age, DNP, FNP-BC  Patient Care Team: Colon Branch, MD as PCP - General Gwenlyn Found Pearletha Forge, MD as PCP - Cardiology (Cardiology) Lorretta Harp, MD as Consulting Physician (Cardiology)  Extended Emergency Contact Information Primary Emergency Contact: Crystal Osborne,[name] Address: [address]          [place], [place] [address] Crystal Osborne of [place] Work Phone: [phone] Mobile Phone: [phone] Relation: Son  Code Status:  DNR  Goals of care: Advanced Directive information Advanced Directives 11/02/2020  Does Patient Have a Medical Advance Directive? Yes  Type of Advance Directive Out of facility DNR (pink MOST or yellow form)  Does patient want to make changes to medical advance directive? No - Patient declined  Copy of [place] in Chart? -  Would patient like information on creating a medical advance directive? -     Chief Complaint  Patient presents with   Discharge Note    For discharge home on 11/03/20 with Home health PT, OT and  Nursing    HPI:  Pt is a 87 y.o. female who is for discharge home with Home health PT, OT and Nursing.  She was admitted to [facility] on 10/16/20 post hospitalization 10/10/2020 to 10/19/2020.  She has a PMH of essential hypertension, hypertrophic cardiomyopathy, tobacco abuse, history of COPD and hyperlipidemia.  She was brought to the hospital by son due to confusion.  She has known history of dementia and has abnormal cognitive function at baseline.  It was reported that he was having shoulder discomfort about 2 weeks for which she was prescribed tramadol.  She was also recently changed from lisinopril to metoprolol after seeing the cardiology clinic for her HOCM.  In the ED she was noted to be hypoxic with saturations in the 80s and was placed on nonrebreather.  Chest x-ray  obtained raise concern for left-sided pneumonia.  COVID test was negative.  Initial WBC showed 12.9.  She was started on ceftriaxone but was discontinued due to tremor.  She was shifted to doxycycline for 7-day course of treatment.  Her confusion has resolved.  Prior to discharge, she complained of dysphagia.  SLP evaluated her and recommended MBS which was done on 10/18/2020.  SLP recommended dysphagia 3 diet with nectar thick liquids.  Patient was admitted to this facility for short-term rehabilitation after the patient's recent hospitalization.  Patient has completed SNF rehabilitation and therapy has cleared the patient for discharge.   Past Medical History:  Diagnosis Date   AAA (abdominal aortic aneurysm) [facility])    sees Dr Crystal Osborne    B12 deficiency    Cancer of right breast [facility]) [place] 11/28/2008   sees Dr Crystal Osborne    COPD (chronic obstructive pulmonary disease) ([place]) 01/25/2014   CVA (cerebrovascular accident) ([place]) 2006   Diabetes mellitus ([place]) 05/29/2010   A1c 6.0 on October 2011    Hyperlipidemia    Hypertension    Osteoporosis 2003   Pneumonia ~ 2014; ~ 2015   Prolapse of female bladder, acquired    Shingles    repeated episone 9/11 went to a UC   TIA (transient ischemic attack) ~ 2005   Transient hypothyroidism    d/t thyroidectomy   UTI (lower urinary tract infection) May 2013   Past Surgical History:  Procedure Laterality Date   ABDOMINAL HYSTERECTOMY     no  oophorectomy   CATARACT EXTRACTION W/ INTRAOCULAR LENS  IMPLANT, BILATERAL Bilateral    DILATION AND CURETTAGE OF UTERUS     MASTECTOMY, RADICAL Right 1960s   remotely    PERIPHERAL VASCULAR CATHETERIZATION  12/03/2015   PERIPHERAL VASCULAR CATHETERIZATION N/A 12/03/2015   Procedure: Abdominal Aortogram;  Surgeon: Lorretta Harp, MD;  Location: [place] CV LAB;  Service: Cardiovascular;  Laterality: N/A;   PERIPHERAL VASCULAR CATHETERIZATION Bilateral 12/03/2015   Procedure: Lower  Extremity Angiography;  Surgeon: Lorretta Harp, MD;  Location: [place] CV LAB;  Service: Cardiovascular;  Laterality: Bilateral;   PERIPHERAL VASCULAR CATHETERIZATION Bilateral 12/03/2015   Procedure: Peripheral Vascular Intervention;  Surgeon: Lorretta Harp, MD;  Location: [place] CV LAB;  Service: Cardiovascular;  Laterality: Bilateral;  44mmx58mm Lifestream bilateral Common Iliac Arteries   PERIPHERAL VASCULAR CATHETERIZATION Bilateral 12/03/2015   Procedure: Peripheral Vascular Atherectomy;  Surgeon: Lorretta Harp, MD;  Location: [place] CV LAB;  Service: Cardiovascular;  Laterality: Bilateral;  Common Iliac Arteries   THYROIDECTOMY      Allergies  Allergen Reactions   Septra [Sulfamethoxazole-Trimethoprim] Itching    Pt states she had a severe allergic reaction; she was in the ICU for 2 days because of taking this.   Ciprofloxacin Hcl Itching   Codeine Rash    Made her feel very strange    Outpatient Encounter Medications as of 11/02/2020  Medication Sig   acetaminophen (TYLENOL) 500 MG tablet Take 1,000 mg by mouth 2 (two) times daily as needed (for pain.).   albuterol (VENTOLIN HFA) 108 (90 Base) MCG/ACT inhaler Inhale 2 puffs into the lungs every 6 (six) hours as needed for wheezing or shortness of breath.   aspirin EC 81 MG tablet Take 81 mg by mouth every evening.   atorvastatin (LIPITOR) 40 MG tablet TAKE 1 TABLET(40 MG) BY MOUTH DAILY   bisacodyl (DULCOLAX) 10 MG suppository Place 10 mg rectally as needed for moderate constipation.   Magnesium Hydroxide (MILK OF MAGNESIA PO) Take by mouth as needed.   METAMUCIL FIBER PO Take 1 packet by mouth 2 (two) times daily.   metoprolol succinate (TOPROL XL) 25 MG 24 hr tablet Take 0.5 tablets (12.5 mg total) by mouth daily.   Sodium Phosphates (RA SALINE ENEMA RE) Place rectally as needed.   [DISCONTINUED] ALPRAZolam (XANAX) 0.25 MG tablet Take 1-2 tablets (0.25-0.5 mg total) by mouth at bedtime as needed for sleep.    [DISCONTINUED] AMBULATORY NON FORMULARY MEDICATION Right Breast Prosthesis  Dx: V15.29   [DISCONTINUED] POTASSIUM PO Take 1 tablet by mouth daily. OTC   [DISCONTINUED] psyllium (METAMUCIL SMOOTH TEXTURE) 28 % packet Take 1 packet by mouth 2 (two) times daily as needed (constipation).   No facility-administered encounter medications on file as of 11/02/2020.    Review of Systems  GENERAL: No change in appetite, no fatigue, no weight changes, no fever, chills or weakness MOUTH and THROAT: Denies oral discomfort, gingival pain or bleeding, pain from teeth or hoarseness   RESPIRATORY: no cough, SOB, DOE, wheezing, hemoptysis CARDIAC: No chest pain, edema or palpitations GI: No abdominal pain, diarrhea, constipation, heart burn, nausea or vomiting GU: Denies dysuria, frequency, hematuria, incontinence, or discharge NEUROLOGICAL: Denies dizziness, syncope, numbness, or headache PSYCHIATRIC: Denies feelings of depression or anxiety. No report of hallucinations, insomnia, paranoia, or agitation   Immunization History  Administered Date(s) Administered   Influenza Split 12/18/2010, 11/07/2013   Influenza Whole 12/08/2006, 12/15/2007, 11/14/2008, 11/03/2012   Influenza, High Dose Seasonal PF 11/06/2014, 10/29/2015  Influenza-Unspecified 10/10/2016, 11/12/2017, 11/23/2018, 11/25/2019   PFIZER(Purple Top)SARS-COV-2 Vaccination 02/25/2019, 03/18/2019   Pneumococcal Conjugate-13 09/18/2016   Pneumococcal Polysaccharide-23 02/10/1998, 01/14/2019   Td 02/10/1997, 04/18/2008   Zoster, Live 04/18/2008, 09/17/2010   Pertinent  Health Maintenance Due  Topic Date Due   DEXA SCAN  Never done   INFLUENZA VACCINE  09/10/2020   Fall Risk  10/04/2020 05/21/2020 01/14/2019 11/04/2018 03/19/2017  Falls in the past year? 0 0 0 0 No  Number falls in past yr: - 0 - - -  Injury with Fall? - 0 - - -  Follow up - - Falls evaluation completed Falls evaluation completed -     Vitals:   11/02/20 0946  BP:  119/70  Pulse: 66  Resp: 18  Temp: 97.7 F (36.5 C)  Weight: 101 lb 12.8 oz (46.2 kg)  Height: 5\' 5"  (1.651 m)   Body mass index is 16.94 kg/m.  Physical Exam  GENERAL APPEARANCE:  In no acute distress.  SKIN:  Skin is warm and dry.  MOUTH and THROAT: Lips are without lesions. Oral mucosa is moist and without lesions.  RESPIRATORY: Breathing is even & unlabored, BS CTAB CARDIAC: RRR, no murmur,no extra heart sounds, no edema GI: Abdomen soft, normal BS, no masses, no tenderness EXTREMITIES:  Able to move X 4 extremities. NEUROLOGICAL: There is no tremor. Speech is clear. Alert and oriented X 3.  PSYCHIATRIC: Affect and behavior are appropriate  Labs reviewed: Recent Labs    09/11/20 1626 09/26/20 2112 10/14/20 0112 10/17/20 0828 10/18/20 0358  NA 138   < > 137 136 138  K 4.8   < > 3.6 4.4 3.8  CL 104   < > 107 106 107  CO2 24   < > 22 23 21*  GLUCOSE 88   < > 115* 85 79  BUN 19   < > 17 23 27*  CREATININE 1.05   < > 0.77 0.77 0.79  CALCIUM 9.2   < > 8.3* 8.1* 8.3*  MG 1.9  --   --  1.9 1.8  PHOS  --   --   --  3.3 3.5   < > = values in this interval not displayed.   Recent Labs    10/12/20 0358 10/17/20 0828 10/18/20 0358  AST 58* 35 20  ALT 110* 29 25  ALKPHOS 97 70 65  BILITOT 0.7 2.0* 1.1  PROT 5.5* 4.9* 5.0*  ALBUMIN 2.8* 2.4* 2.5*   Recent Labs    10/10/20 1524 10/11/20 0042 10/14/20 0112 10/17/20 0828 10/18/20 0358  WBC 12.9*   < > 13.8* 10.5 9.4  NEUTROABS 10.1*  --   --  8.6* 7.3  HGB 14.1   < > 12.3 13.0 13.1  HCT 43.2   < > 37.5 39.7 40.1  MCV 93.9   < > 92.6 93.0 93.7  PLT 238   < > 238 249 251   < > = values in this interval not displayed.   Lab Results  Component Value Date   TSH 1.765 10/11/2020   Lab Results  Component Value Date   HGBA1C 5.7 05/21/2020   Lab Results  Component Value Date   CHOL 134 10/24/2019   HDL 51 10/24/2019   LDLCALC 63 10/24/2019   LDLDIRECT 149.2 08/03/2006   TRIG 117 10/24/2019   CHOLHDL 2.6  10/24/2019    Significant Diagnostic Results in last 30 days:  DG Chest 1 View  Result Date: 10/10/2020 CLINICAL DATA:  Altered  mental status, shortness of breath EXAM: CHEST  1 VIEW COMPARISON:  Chest radiograph 11/09/2015 FINDINGS: The heart is enlarged. The mediastinal contours are within normal limits. There is calcified atherosclerotic plaque of the aortic arch. There are coarsened interstitial markings throughout both lungs, likely chronic. There is a small left pleural effusion with adjacent airspace disease. The right costophrenic angle is cut off. There is no overt pulmonary edema. There is no pneumothorax. There is no acute osseous abnormality. IMPRESSION: 1. Small left pleural effusion with adjacent airspace disease which may reflect atelectasis or pneumonia. 2. Mild cardiomegaly. Electronically Signed   By: Valetta Mole M.D.   On: 10/10/2020 16:01   CT Head Wo Contrast  Result Date: 10/10/2020 CLINICAL DATA:  Mental status change EXAM: CT HEAD WITHOUT CONTRAST TECHNIQUE: Contiguous axial images were obtained from the base of the skull through the vertex without intravenous contrast. COMPARISON:  CT 07/20/2016, MRI 09/22/2020 FINDINGS: Brain: Motion degradation. No acute territorial infarction, hemorrhage or intracranial mass. Moderate severe chronic small vessel ischemic changes of the white matter. Chronic lacunar infarct within the left white matter and basal ganglia. Chronic left cerebellar infarct. Stable ventricle size. Atrophy Vascular: No hyperdense vessels. Carotid vascular and vertebral calcification Skull: Normal. Negative for fracture or focal lesion. Sinuses/Orbits: No acute finding. Other: None IMPRESSION: 1. Motion degraded study. 2. No definite CT evidence for acute intracranial abnormality. Atrophy and chronic small vessel ischemic changes of the white matter. Electronically Signed   By: Donavan Foil M.D.   On: 10/10/2020 19:42   DG CHEST PORT 1 VIEW  Result Date:  10/18/2020 CLINICAL DATA:  SOB (shortness of breath) hx copd EXAM: PORTABLE CHEST - 1 VIEW COMPARISON:  10/10/2020 FINDINGS: Interval improvement in interstitial edema. Persistent left retrocardiac consolidation/atelectasis. No new airspace disease. Heart size and mediastinal contours are within normal limits. Aortic Atherosclerosis (ICD10-170.0). Small bilateral pleural effusions. Visualized bones unremarkable. IMPRESSION: Improving interstitial edema or infiltrates, with persistent small pleural effusions. Electronically Signed   By: Lucrezia Europe M.D.   On: 10/18/2020 09:25   DG Shoulder Right Port  Result Date: 10/13/2020 CLINICAL DATA:  Right shoulder pain. EXAM: PORTABLE RIGHT SHOULDER COMPARISON:  None. FINDINGS: No acute fracture or dislocation identified. Density following the course of the lateral humeral head may relate to prior surgery/rotator cuff repair. Mild degenerative disease of the [facility] joint. No bony lesions or destruction. Soft tissues are unremarkable. IMPRESSION: No acute findings. Density following the lateral humeral head may relate to prior rotator cuff repair. Electronically Signed   By: Aletta Edouard M.D.   On: 10/13/2020 14:00   DG Swallowing Func-Speech Pathology  Result Date: 10/18/2020 Table formatting from the original result was not included. Objective Swallowing Evaluation: Type of Study: MBS-Modified Barium Swallow Study  Patient Details Name: Crystal Osborne MRN: [id number] Date of Birth: 08-16-1931 Today's Date: 10/18/2020 Time: SLP Start Time (ACUTE ONLY): 1008 -SLP Stop Time (ACUTE ONLY): 1884 SLP Time Calculation (min) (ACUTE ONLY): 11 min Past Medical History: Past Medical History: Diagnosis Date  AAA (abdominal aortic aneurysm) [facility])   sees Dr Crystal Osborne   B12 deficiency   Cancer of right breast [facility]) [place] 11/28/2008  sees Dr Crystal Osborne   COPD (chronic obstructive pulmonary disease) ([place]) 01/25/2014  CVA (cerebrovascular accident) ([place]) 2006  Diabetes mellitus  ([place]) 05/29/2010  A1c 6.0 on October 2011   Hyperlipidemia   Hypertension   Osteoporosis 2003  Pneumonia ~ 2014; ~ 2015  Prolapse of female bladder, acquired  Shingles   repeated episone 9/11 went to a UC  TIA (transient ischemic attack) ~ 2005  Transient hypothyroidism   d/t thyroidectomy  UTI (lower urinary tract infection) May 2013 Past Surgical History: Past Surgical History: Procedure Laterality Date  ABDOMINAL HYSTERECTOMY    no oophorectomy  CATARACT EXTRACTION W/ INTRAOCULAR LENS  IMPLANT, BILATERAL Bilateral   DILATION AND CURETTAGE OF UTERUS    MASTECTOMY, RADICAL Right 1960s  remotely   PERIPHERAL VASCULAR CATHETERIZATION  12/03/2015  PERIPHERAL VASCULAR CATHETERIZATION N/A 12/03/2015  Procedure: Abdominal Aortogram;  Surgeon: Lorretta Harp, MD;  Location: [place] CV LAB;  Service: Cardiovascular;  Laterality: N/A;  PERIPHERAL VASCULAR CATHETERIZATION Bilateral 12/03/2015  Procedure: Lower Extremity Angiography;  Surgeon: Lorretta Harp, MD;  Location: [place] CV LAB;  Service: Cardiovascular;  Laterality: Bilateral;  PERIPHERAL VASCULAR CATHETERIZATION Bilateral 12/03/2015  Procedure: Peripheral Vascular Intervention;  Surgeon: Lorretta Harp, MD;  Location: [place] CV LAB;  Service: Cardiovascular;  Laterality: Bilateral;  86mmx58mm Lifestream bilateral Common Iliac Arteries  PERIPHERAL VASCULAR CATHETERIZATION Bilateral 12/03/2015  Procedure: Peripheral Vascular Atherectomy;  Surgeon: Lorretta Harp, MD;  Location: [place] CV LAB;  Service: Cardiovascular;  Laterality: Bilateral;  Common Iliac Arteries  THYROIDECTOMY   HPI: Pt is an 87 y.o. female who presented with confusion. CT head negative.    CXR 8/31: Small left pleural effusion with adjacent airspace disease which may reflect atelectasis or pneumonia. Pt reported to MD having difficulty swallow and being fearful of choking; pt stated at that time that she comforatbly eats mashed foods like banana, but gets scared if food  has increased consistency. Discharge cancelled and SLP consulted. RN's note on 9/7 denies pt having difficulty. PMH: essential hypertension, hypertrophic cardiomyopathy, tobacco abuse, history of COPD, hyperlipidemia.  No data recorded Assessment / Plan / Recommendation CHL IP CLINICAL IMPRESSIONS 10/18/2020 Clinical Impression Pt presents with oropharyngeal dysphagia characterized by impaired bolus cohesion, a pharyngeal delay, reduced pharyngeal constriction, and reduced anterior laryngeal movement. She demonstrated premature spillage to the pyriform sinuses, posterior pharyngeal wall residue, and mild pyriform sinus residue. Penetration (PAS 2) was noted with consecutive swallows of nectar thick liquids and aspiration (PAS 7) was observed once when a pill was taken with nectar thick liquids via straw. Penetration (PAS 3,5) and silent aspiration (PAS 8) were noted with thin liquids via cup. Laryngeal invasion was secondary to the pharyngeal delay and only larger amounts of aspiration triggered coughing, but this did not facilitate expulsion of the aspirate. A chin tuck posture and effortful swallows were ineffective in eliminating laryngeal invasion, but an effortful swallow did improve pharyngeal clearance. A dysphagia 3 diet with nectar thick liquids is recommended at this time. SLP will follow for treatment. SLP Visit Diagnosis Dysphagia, oropharyngeal phase (R13.12) Attention and concentration deficit following -- Frontal lobe and executive function deficit following -- Impact on safety and function Mild aspiration risk   CHL IP TREATMENT RECOMMENDATION 10/18/2020 Treatment Recommendations Therapy as outlined in treatment plan below   Prognosis 10/18/2020 Prognosis for Safe Diet Advancement Good Barriers to Reach Goals -- Barriers/Prognosis Comment -- CHL IP DIET RECOMMENDATION 10/18/2020 SLP Diet Recommendations Dysphagia 3 (Mech soft) solids;Nectar thick liquid Liquid Administration via Cup;No straw Medication  Administration Whole meds with puree Compensations Slow rate;Small sips/bites Postural Changes Seated upright at 90 degrees   CHL IP OTHER RECOMMENDATIONS 10/18/2020 Recommended Consults -- Oral Care Recommendations Oral care BID Other Recommendations --   CHL IP FOLLOW UP RECOMMENDATIONS 10/18/2020 Follow up Recommendations Skilled Nursing facility  CHL IP FREQUENCY AND DURATION 10/18/2020 Speech Therapy Frequency (ACUTE ONLY) min 2x/week Treatment Duration 2 weeks      CHL IP ORAL PHASE 10/18/2020 Oral Phase Impaired Oral - Pudding Teaspoon -- Oral - Pudding Cup -- Oral - Honey Teaspoon -- Oral - Honey Cup -- Oral - Nectar Teaspoon -- Oral - Nectar Cup Decreased bolus cohesion;Premature spillage Oral - Nectar Straw Decreased bolus cohesion;Premature spillage Oral - Thin Teaspoon -- Oral - Thin Cup Decreased bolus cohesion;Premature spillage Oral - Thin Straw Decreased velopharyngeal closure Oral - Puree Decreased bolus cohesion;Premature spillage Oral - Mech Soft -- Oral - Regular Impaired mastication Oral - Multi-Consistency -- Oral - Pill Decreased bolus cohesion;Premature spillage Oral Phase - Comment --  CHL IP PHARYNGEAL PHASE 10/18/2020 Pharyngeal Phase Impaired Pharyngeal- Pudding Teaspoon -- Pharyngeal -- Pharyngeal- Pudding Cup -- Pharyngeal -- Pharyngeal- Honey Teaspoon -- Pharyngeal -- Pharyngeal- Honey Cup -- Pharyngeal -- Pharyngeal- Nectar Teaspoon -- Pharyngeal -- Pharyngeal- Nectar Cup Delayed swallow initiation-pyriform sinuses;Reduced pharyngeal peristalsis;Pharyngeal residue - pyriform;Pharyngeal residue - posterior pharnyx;Penetration/Aspiration during swallow;Reduced anterior laryngeal mobility Pharyngeal Material enters airway, remains ABOVE vocal cords then ejected out Pharyngeal- Nectar Straw Delayed swallow initiation-pyriform sinuses;Reduced pharyngeal peristalsis;Pharyngeal residue - pyriform;Pharyngeal residue - posterior pharnyx;Penetration/Aspiration during swallow;Reduced anterior laryngeal  mobility Pharyngeal Material enters airway, remains ABOVE vocal cords then ejected out;Material enters airway, passes BELOW cords and not ejected out despite cough attempt by patient Pharyngeal- Thin Teaspoon -- Pharyngeal -- Pharyngeal- Thin Cup Delayed swallow initiation-pyriform sinuses;Reduced pharyngeal peristalsis;Pharyngeal residue - pyriform;Pharyngeal residue - posterior pharnyx;Penetration/Aspiration during swallow;Reduced anterior laryngeal mobility Pharyngeal Material enters airway, remains ABOVE vocal cords and not ejected out;Material enters airway, CONTACTS cords and not ejected out;Material enters airway, passes BELOW cords without attempt by patient to eject out (silent aspiration) Pharyngeal- Thin Straw -- Pharyngeal -- Pharyngeal- Puree Delayed swallow initiation-pyriform sinuses;Reduced pharyngeal peristalsis;Pharyngeal residue - pyriform;Pharyngeal residue - posterior pharnyx;Penetration/Aspiration during swallow;Reduced anterior laryngeal mobility Pharyngeal -- Pharyngeal- Mechanical Soft -- Pharyngeal -- Pharyngeal- Regular Delayed swallow initiation-pyriform sinuses;Reduced pharyngeal peristalsis;Pharyngeal residue - pyriform;Pharyngeal residue - posterior pharnyx;Reduced anterior laryngeal mobility Pharyngeal -- Pharyngeal- Multi-consistency -- Pharyngeal -- Pharyngeal- Pill Delayed swallow initiation-pyriform sinuses;Reduced pharyngeal peristalsis;Pharyngeal residue - pyriform;Pharyngeal residue - posterior pharnyx;Reduced anterior laryngeal mobility Pharyngeal -- Pharyngeal Comment --  CHL IP CERVICAL ESOPHAGEAL PHASE 10/18/2020 Cervical Esophageal Phase WFL Pudding Teaspoon -- Pudding Cup -- Honey Teaspoon -- Honey Cup -- Nectar Teaspoon -- Nectar Cup -- Nectar Straw -- Thin Teaspoon -- Thin Cup -- Thin Straw -- Puree -- Mechanical Soft -- Regular -- Multi-consistency -- Pill -- Cervical Esophageal Comment -- Shanika I. Hardin Negus, [place], [place] Office number  [phone] Pager [phone] Horton Marshall 10/18/2020, 11:01 AM               Assessment/Plan  1. Community acquired pneumonia of left lower lobe of lung -  completed antibiotics, resolved  2. Essential hypertension - metoprolol succinate (TOPROL XL) 25 MG 24 hr tablet; Take 0.5 tablets (12.5 mg total) by mouth daily.  Dispense: 15 tablet; Refill: 0  3. TOBACCO ABUSE - counseled  4. Mixed hyperlipidemia - atorvastatin (LIPITOR) 40 MG tablet; TAKE 1 TABLET(40 MG) BY MOUTH DAILY  Dispense: 30 tablet; Refill: 0  5. COPD -  no SOB/wheezing, now on room air - albuterol (VENTOLIN HFA) 108 (90 Base) MCG/ACT inhaler; Inhale 2 puffs into the lungs every 6 (six) hours as needed for wheezing or shortness of breath.  Dispense: 8 g; Refill: 0  6.  Oropharyngeal dysphagia -  speech therapy evaluated and treated her and now on regular diet with thin liquids    I have filled out patient's discharge paperwork ande-prescribed medications.  Patient will have home health PT, OT and Nursing..  DME provided:  wheelchair  Patient suffers from COPD which he has had ability to perform daily activities like toileting, dressing, grooming and bathing in the home.  A cane or walker will not resolve the issue with performing activities of daily living.  A wheelchair will allow patient to safely perform daily activities.  Patient can safely propel the wheelchair in the home.   Total discharge time: Greater than 30 minutes  Discharge time involved coordination of the discharge process with social worker, nursing staff and therapy department. Medical justification for home health services/DME verified.    Crystal Osborne Age, DNP, MSN, FNP-BC [facility] [phone] (Monday-Friday 8:00 a.m. - 5:00 p.m.) [phone] (after hours)

## 2020-11-03 ENCOUNTER — Other Ambulatory Visit: Payer: Self-pay | Admitting: Adult Health

## 2020-11-03 DIAGNOSIS — J449 Chronic obstructive pulmonary disease, unspecified: Secondary | ICD-10-CM

## 2020-11-03 MED ORDER — METOPROLOL SUCCINATE ER 25 MG PO TB24: 12.5000 mg | ORAL_TABLET | Freq: Every day | ORAL | 0 refills | Status: AC

## 2020-11-03 MED ORDER — ALBUTEROL SULFATE HFA 108 (90 BASE) MCG/ACT IN AERS: 2.0000 | INHALATION_SPRAY | Freq: Four times a day (QID) | RESPIRATORY_TRACT | 0 refills | Status: DC | PRN

## 2020-11-03 MED ORDER — ATORVASTATIN CALCIUM 40 MG PO TABS: ORAL_TABLET | ORAL | 0 refills | Status: DC

## 2020-11-05 ENCOUNTER — Telehealth: Payer: Self-pay | Admitting: Internal Medicine

## 2020-11-05 DIAGNOSIS — M810 Age-related osteoporosis without current pathological fracture: Secondary | ICD-10-CM | POA: Diagnosis not present

## 2020-11-05 DIAGNOSIS — Z9011 Acquired absence of right breast and nipple: Secondary | ICD-10-CM | POA: Diagnosis not present

## 2020-11-05 DIAGNOSIS — E785 Hyperlipidemia, unspecified: Secondary | ICD-10-CM | POA: Diagnosis not present

## 2020-11-05 DIAGNOSIS — J9601 Acute respiratory failure with hypoxia: Secondary | ICD-10-CM | POA: Diagnosis not present

## 2020-11-05 DIAGNOSIS — I10 Essential (primary) hypertension: Secondary | ICD-10-CM | POA: Diagnosis not present

## 2020-11-05 DIAGNOSIS — J440 Chronic obstructive pulmonary disease with acute lower respiratory infection: Secondary | ICD-10-CM | POA: Diagnosis not present

## 2020-11-05 DIAGNOSIS — Z853 Personal history of malignant neoplasm of breast: Secondary | ICD-10-CM | POA: Diagnosis not present

## 2020-11-05 DIAGNOSIS — Z8673 Personal history of transient ischemic attack (TIA), and cerebral infarction without residual deficits: Secondary | ICD-10-CM | POA: Diagnosis not present

## 2020-11-05 DIAGNOSIS — G9341 Metabolic encephalopathy: Secondary | ICD-10-CM | POA: Diagnosis not present

## 2020-11-05 DIAGNOSIS — I2510 Atherosclerotic heart disease of native coronary artery without angina pectoris: Secondary | ICD-10-CM | POA: Diagnosis not present

## 2020-11-05 DIAGNOSIS — I429 Cardiomyopathy, unspecified: Secondary | ICD-10-CM | POA: Diagnosis not present

## 2020-11-05 DIAGNOSIS — F17210 Nicotine dependence, cigarettes, uncomplicated: Secondary | ICD-10-CM | POA: Diagnosis not present

## 2020-11-05 DIAGNOSIS — Z7982 Long term (current) use of aspirin: Secondary | ICD-10-CM | POA: Diagnosis not present

## 2020-11-05 DIAGNOSIS — R451 Restlessness and agitation: Secondary | ICD-10-CM | POA: Diagnosis not present

## 2020-11-05 NOTE — Telephone Encounter (Signed)
April from [facility] called and stated she needs verbal orders for nursing. 2 times a week for 1 week and 1 time a week for 2 weeks. She is also inquiring if the patient is a diabetic. Please advise. [phone]

## 2020-11-05 NOTE — Telephone Encounter (Signed)
Spoke w/ April- verbal orders given. Chart reviewed- Pt is not diabetic.

## 2020-11-06 DIAGNOSIS — R451 Restlessness and agitation: Secondary | ICD-10-CM | POA: Diagnosis not present

## 2020-11-06 DIAGNOSIS — F17210 Nicotine dependence, cigarettes, uncomplicated: Secondary | ICD-10-CM | POA: Diagnosis not present

## 2020-11-06 DIAGNOSIS — Z7982 Long term (current) use of aspirin: Secondary | ICD-10-CM | POA: Diagnosis not present

## 2020-11-06 DIAGNOSIS — E785 Hyperlipidemia, unspecified: Secondary | ICD-10-CM | POA: Diagnosis not present

## 2020-11-06 DIAGNOSIS — J440 Chronic obstructive pulmonary disease with acute lower respiratory infection: Secondary | ICD-10-CM | POA: Diagnosis not present

## 2020-11-06 DIAGNOSIS — Z8673 Personal history of transient ischemic attack (TIA), and cerebral infarction without residual deficits: Secondary | ICD-10-CM | POA: Diagnosis not present

## 2020-11-06 DIAGNOSIS — Z853 Personal history of malignant neoplasm of breast: Secondary | ICD-10-CM | POA: Diagnosis not present

## 2020-11-06 DIAGNOSIS — I429 Cardiomyopathy, unspecified: Secondary | ICD-10-CM | POA: Diagnosis not present

## 2020-11-06 DIAGNOSIS — M810 Age-related osteoporosis without current pathological fracture: Secondary | ICD-10-CM | POA: Diagnosis not present

## 2020-11-06 DIAGNOSIS — G9341 Metabolic encephalopathy: Secondary | ICD-10-CM | POA: Diagnosis not present

## 2020-11-06 DIAGNOSIS — I10 Essential (primary) hypertension: Secondary | ICD-10-CM | POA: Diagnosis not present

## 2020-11-06 DIAGNOSIS — J9601 Acute respiratory failure with hypoxia: Secondary | ICD-10-CM | POA: Diagnosis not present

## 2020-11-06 DIAGNOSIS — Z9011 Acquired absence of right breast and nipple: Secondary | ICD-10-CM | POA: Diagnosis not present

## 2020-11-06 DIAGNOSIS — I2510 Atherosclerotic heart disease of native coronary artery without angina pectoris: Secondary | ICD-10-CM | POA: Diagnosis not present

## 2020-11-07 ENCOUNTER — Emergency Department (HOSPITAL_COMMUNITY): Payer: Medicare Other

## 2020-11-07 ENCOUNTER — Encounter: Payer: Self-pay | Admitting: Internal Medicine

## 2020-11-07 ENCOUNTER — Ambulatory Visit: Payer: Medicare Other | Admitting: Medical

## 2020-11-07 ENCOUNTER — Encounter (HOSPITAL_COMMUNITY): Payer: Self-pay

## 2020-11-07 ENCOUNTER — Inpatient Hospital Stay (HOSPITAL_COMMUNITY)
Admission: EM | Admit: 2020-11-07 | Discharge: 2020-11-14 | DRG: 563 | Disposition: A | Discharge status: Skilled Nursing Facility | Payer: Medicare Other | Attending: Family Medicine | Admitting: Family Medicine

## 2020-11-07 DIAGNOSIS — E1151 Type 2 diabetes mellitus with diabetic peripheral angiopathy without gangrene: Secondary | ICD-10-CM | POA: Diagnosis not present

## 2020-11-07 DIAGNOSIS — F17210 Nicotine dependence, cigarettes, uncomplicated: Secondary | ICD-10-CM | POA: Diagnosis present

## 2020-11-07 DIAGNOSIS — I422 Other hypertrophic cardiomyopathy: Secondary | ICD-10-CM | POA: Diagnosis present

## 2020-11-07 DIAGNOSIS — J181 Lobar pneumonia, unspecified organism: Secondary | ICD-10-CM | POA: Diagnosis not present

## 2020-11-07 DIAGNOSIS — S4992XA Unspecified injury of left shoulder and upper arm, initial encounter: Secondary | ICD-10-CM | POA: Diagnosis not present

## 2020-11-07 DIAGNOSIS — Z9841 Cataract extraction status, right eye: Secondary | ICD-10-CM | POA: Diagnosis not present

## 2020-11-07 DIAGNOSIS — R0902 Hypoxemia: Secondary | ICD-10-CM | POA: Diagnosis not present

## 2020-11-07 DIAGNOSIS — W010XXA Fall on same level from slipping, tripping and stumbling without subsequent striking against object, initial encounter: Secondary | ICD-10-CM | POA: Diagnosis present

## 2020-11-07 DIAGNOSIS — Z7401 Bed confinement status: Secondary | ICD-10-CM | POA: Diagnosis not present

## 2020-11-07 DIAGNOSIS — S0990XA Unspecified injury of head, initial encounter: Secondary | ICD-10-CM | POA: Diagnosis not present

## 2020-11-07 DIAGNOSIS — E43 Unspecified severe protein-calorie malnutrition: Secondary | ICD-10-CM | POA: Diagnosis not present

## 2020-11-07 DIAGNOSIS — Z79899 Other long term (current) drug therapy: Secondary | ICD-10-CM

## 2020-11-07 DIAGNOSIS — I69828 Other speech and language deficits following other cerebrovascular disease: Secondary | ICD-10-CM | POA: Diagnosis not present

## 2020-11-07 DIAGNOSIS — J90 Pleural effusion, not elsewhere classified: Secondary | ICD-10-CM | POA: Diagnosis not present

## 2020-11-07 DIAGNOSIS — M810 Age-related osteoporosis without current pathological fracture: Secondary | ICD-10-CM | POA: Diagnosis present

## 2020-11-07 DIAGNOSIS — S43005D Unspecified dislocation of left shoulder joint, subsequent encounter: Secondary | ICD-10-CM | POA: Diagnosis not present

## 2020-11-07 DIAGNOSIS — Y92009 Unspecified place in unspecified non-institutional (private) residence as the place of occurrence of the external cause: Secondary | ICD-10-CM | POA: Diagnosis not present

## 2020-11-07 DIAGNOSIS — J9811 Atelectasis: Secondary | ICD-10-CM | POA: Diagnosis not present

## 2020-11-07 DIAGNOSIS — I493 Ventricular premature depolarization: Secondary | ICD-10-CM | POA: Diagnosis present

## 2020-11-07 DIAGNOSIS — I509 Heart failure, unspecified: Secondary | ICD-10-CM | POA: Diagnosis not present

## 2020-11-07 DIAGNOSIS — E890 Postprocedural hypothyroidism: Secondary | ICD-10-CM | POA: Diagnosis present

## 2020-11-07 DIAGNOSIS — I714 Abdominal aortic aneurysm, without rupture, unspecified: Secondary | ICD-10-CM | POA: Diagnosis not present

## 2020-11-07 DIAGNOSIS — W19XXXA Unspecified fall, initial encounter: Secondary | ICD-10-CM | POA: Diagnosis not present

## 2020-11-07 DIAGNOSIS — I739 Peripheral vascular disease, unspecified: Secondary | ICD-10-CM | POA: Diagnosis not present

## 2020-11-07 DIAGNOSIS — I6381 Other cerebral infarction due to occlusion or stenosis of small artery: Secondary | ICD-10-CM | POA: Diagnosis not present

## 2020-11-07 DIAGNOSIS — Z9181 History of falling: Secondary | ICD-10-CM | POA: Diagnosis not present

## 2020-11-07 DIAGNOSIS — Z9011 Acquired absence of right breast and nipple: Secondary | ICD-10-CM | POA: Diagnosis not present

## 2020-11-07 DIAGNOSIS — I272 Pulmonary hypertension, unspecified: Secondary | ICD-10-CM | POA: Diagnosis not present

## 2020-11-07 DIAGNOSIS — M2550 Pain in unspecified joint: Secondary | ICD-10-CM | POA: Diagnosis not present

## 2020-11-07 DIAGNOSIS — Z743 Need for continuous supervision: Secondary | ICD-10-CM | POA: Diagnosis not present

## 2020-11-07 DIAGNOSIS — E1169 Type 2 diabetes mellitus with other specified complication: Secondary | ICD-10-CM | POA: Diagnosis not present

## 2020-11-07 DIAGNOSIS — Z885 Allergy status to narcotic agent status: Secondary | ICD-10-CM

## 2020-11-07 DIAGNOSIS — Z66 Do not resuscitate: Secondary | ICD-10-CM | POA: Diagnosis not present

## 2020-11-07 DIAGNOSIS — F0390 Unspecified dementia without behavioral disturbance: Secondary | ICD-10-CM | POA: Diagnosis present

## 2020-11-07 DIAGNOSIS — S43015A Anterior dislocation of left humerus, initial encounter: Secondary | ICD-10-CM | POA: Diagnosis not present

## 2020-11-07 DIAGNOSIS — R1319 Other dysphagia: Secondary | ICD-10-CM | POA: Diagnosis not present

## 2020-11-07 DIAGNOSIS — R1312 Dysphagia, oropharyngeal phase: Secondary | ICD-10-CM | POA: Diagnosis not present

## 2020-11-07 DIAGNOSIS — I779 Disorder of arteries and arterioles, unspecified: Secondary | ICD-10-CM | POA: Diagnosis not present

## 2020-11-07 DIAGNOSIS — I2510 Atherosclerotic heart disease of native coronary artery without angina pectoris: Secondary | ICD-10-CM | POA: Diagnosis not present

## 2020-11-07 DIAGNOSIS — J449 Chronic obstructive pulmonary disease, unspecified: Secondary | ICD-10-CM | POA: Diagnosis present

## 2020-11-07 DIAGNOSIS — G9341 Metabolic encephalopathy: Secondary | ICD-10-CM | POA: Diagnosis not present

## 2020-11-07 DIAGNOSIS — S43085D Other dislocation of left shoulder joint, subsequent encounter: Secondary | ICD-10-CM | POA: Diagnosis not present

## 2020-11-07 DIAGNOSIS — S4292XD Fracture of left shoulder girdle, part unspecified, subsequent encounter for fracture with routine healing: Secondary | ICD-10-CM | POA: Diagnosis not present

## 2020-11-07 DIAGNOSIS — E785 Hyperlipidemia, unspecified: Secondary | ICD-10-CM | POA: Diagnosis present

## 2020-11-07 DIAGNOSIS — Z90710 Acquired absence of both cervix and uterus: Secondary | ICD-10-CM

## 2020-11-07 DIAGNOSIS — M25519 Pain in unspecified shoulder: Secondary | ICD-10-CM | POA: Diagnosis not present

## 2020-11-07 DIAGNOSIS — S43035A Inferior dislocation of left humerus, initial encounter: Secondary | ICD-10-CM | POA: Diagnosis not present

## 2020-11-07 DIAGNOSIS — J811 Chronic pulmonary edema: Secondary | ICD-10-CM | POA: Diagnosis not present

## 2020-11-07 DIAGNOSIS — R2681 Unsteadiness on feet: Secondary | ICD-10-CM | POA: Diagnosis not present

## 2020-11-07 DIAGNOSIS — S42252A Displaced fracture of greater tuberosity of left humerus, initial encounter for closed fracture: Secondary | ICD-10-CM | POA: Diagnosis not present

## 2020-11-07 DIAGNOSIS — I10 Essential (primary) hypertension: Secondary | ICD-10-CM | POA: Diagnosis present

## 2020-11-07 DIAGNOSIS — S43006A Unspecified dislocation of unspecified shoulder joint, initial encounter: Secondary | ICD-10-CM

## 2020-11-07 DIAGNOSIS — R451 Restlessness and agitation: Secondary | ICD-10-CM | POA: Diagnosis not present

## 2020-11-07 DIAGNOSIS — Z853 Personal history of malignant neoplasm of breast: Secondary | ICD-10-CM

## 2020-11-07 DIAGNOSIS — Z20822 Contact with and (suspected) exposure to covid-19: Secondary | ICD-10-CM | POA: Diagnosis not present

## 2020-11-07 DIAGNOSIS — Z7982 Long term (current) use of aspirin: Secondary | ICD-10-CM

## 2020-11-07 DIAGNOSIS — S42292A Other displaced fracture of upper end of left humerus, initial encounter for closed fracture: Secondary | ICD-10-CM | POA: Diagnosis not present

## 2020-11-07 DIAGNOSIS — S199XXA Unspecified injury of neck, initial encounter: Secondary | ICD-10-CM | POA: Diagnosis not present

## 2020-11-07 DIAGNOSIS — Z961 Presence of intraocular lens: Secondary | ICD-10-CM | POA: Diagnosis not present

## 2020-11-07 DIAGNOSIS — Z881 Allergy status to other antibiotic agents status: Secondary | ICD-10-CM

## 2020-11-07 DIAGNOSIS — S4292XA Fracture of left shoulder girdle, part unspecified, initial encounter for closed fracture: Secondary | ICD-10-CM | POA: Diagnosis not present

## 2020-11-07 DIAGNOSIS — E875 Hyperkalemia: Secondary | ICD-10-CM | POA: Diagnosis not present

## 2020-11-07 DIAGNOSIS — R262 Difficulty in walking, not elsewhere classified: Secondary | ICD-10-CM | POA: Diagnosis not present

## 2020-11-07 DIAGNOSIS — Z8673 Personal history of transient ischemic attack (TIA), and cerebral infarction without residual deficits: Secondary | ICD-10-CM

## 2020-11-07 DIAGNOSIS — Z882 Allergy status to sulfonamides status: Secondary | ICD-10-CM

## 2020-11-07 DIAGNOSIS — M25512 Pain in left shoulder: Secondary | ICD-10-CM | POA: Diagnosis not present

## 2020-11-07 DIAGNOSIS — Z9842 Cataract extraction status, left eye: Secondary | ICD-10-CM | POA: Diagnosis not present

## 2020-11-07 DIAGNOSIS — S43085A Other dislocation of left shoulder joint, initial encounter: Secondary | ICD-10-CM | POA: Diagnosis not present

## 2020-11-07 DIAGNOSIS — R1310 Dysphagia, unspecified: Secondary | ICD-10-CM | POA: Insufficient documentation

## 2020-11-07 DIAGNOSIS — E119 Type 2 diabetes mellitus without complications: Secondary | ICD-10-CM | POA: Diagnosis not present

## 2020-11-07 DIAGNOSIS — J9601 Acute respiratory failure with hypoxia: Secondary | ICD-10-CM | POA: Diagnosis not present

## 2020-11-07 DIAGNOSIS — I429 Cardiomyopathy, unspecified: Secondary | ICD-10-CM | POA: Diagnosis not present

## 2020-11-07 DIAGNOSIS — Z01810 Encounter for preprocedural cardiovascular examination: Secondary | ICD-10-CM | POA: Diagnosis not present

## 2020-11-07 DIAGNOSIS — S43005A Unspecified dislocation of left shoulder joint, initial encounter: Secondary | ICD-10-CM | POA: Diagnosis not present

## 2020-11-07 DIAGNOSIS — M6281 Muscle weakness (generalized): Secondary | ICD-10-CM | POA: Diagnosis not present

## 2020-11-07 DIAGNOSIS — R6889 Other general symptoms and signs: Secondary | ICD-10-CM | POA: Diagnosis not present

## 2020-11-07 DIAGNOSIS — M4312 Spondylolisthesis, cervical region: Secondary | ICD-10-CM | POA: Diagnosis not present

## 2020-11-07 DIAGNOSIS — I421 Obstructive hypertrophic cardiomyopathy: Secondary | ICD-10-CM | POA: Diagnosis not present

## 2020-11-07 DIAGNOSIS — I340 Nonrheumatic mitral (valve) insufficiency: Secondary | ICD-10-CM | POA: Diagnosis not present

## 2020-11-07 DIAGNOSIS — I517 Cardiomegaly: Secondary | ICD-10-CM | POA: Diagnosis not present

## 2020-11-07 DIAGNOSIS — I69398 Other sequelae of cerebral infarction: Secondary | ICD-10-CM | POA: Diagnosis not present

## 2020-11-07 DIAGNOSIS — J440 Chronic obstructive pulmonary disease with acute lower respiratory infection: Secondary | ICD-10-CM | POA: Diagnosis not present

## 2020-11-07 LAB — CBC
HCT: 38.5 % (ref 36.0–46.0)
Hemoglobin: 12.6 g/dL (ref 12.0–15.0)
MCH: 30.5 pg (ref 26.0–34.0)
MCHC: 32.7 g/dL (ref 30.0–36.0)
MCV: 93.2 fL (ref 80.0–100.0)
Platelets: 244 10*3/uL (ref 150–400)
RBC: 4.13 MIL/uL (ref 3.87–5.11)
RDW: 14.3 % (ref 11.5–15.5)
WBC: 10.7 10*3/uL — ABNORMAL HIGH (ref 4.0–10.5)
nRBC: 0 % (ref 0.0–0.2)

## 2020-11-07 LAB — BASIC METABOLIC PANEL
Anion gap: 7 (ref 5–15)
BUN: 18 mg/dL (ref 8–23)
CO2: 27 mmol/L (ref 22–32)
Calcium: 8.1 mg/dL — ABNORMAL LOW (ref 8.9–10.3)
Chloride: 105 mmol/L (ref 98–111)
Creatinine, Ser: 0.74 mg/dL (ref 0.44–1.00)
GFR, Estimated: >60 mL/min (ref >60)
Glucose, Bld: 105 mg/dL — ABNORMAL HIGH (ref 70–99)
Potassium: 5.5 mmol/L — ABNORMAL HIGH (ref 3.5–5.1)
Sodium: 139 mmol/L (ref 135–145)

## 2020-11-07 LAB — TYPE AND SCREEN
ABO/RH(D): AB POS
Antibody Screen: NEGATIVE

## 2020-11-07 LAB — RESP PANEL BY RT-PCR (FLU A&B, COVID) ARPGX2
Influenza A by PCR: NEGATIVE
Influenza B by PCR: NEGATIVE
SARS Coronavirus 2 by RT PCR: NEGATIVE

## 2020-11-07 MED ORDER — HYDROMORPHONE HCL 1 MG/ML IJ SOLN
0.5000 mg | INTRAMUSCULAR | Status: DC | PRN
Start: 2020-11-07 — End: 2020-11-14
  Administered 2020-11-07: 1 mg via INTRAVENOUS
  Filled 2020-11-07: qty 1

## 2020-11-07 MED ORDER — FENTANYL CITRATE PF 50 MCG/ML IJ SOSY
50.0000 ug | PREFILLED_SYRINGE | Freq: Once | INTRAMUSCULAR | Status: AC
  Administered 2020-11-07: 50 ug via INTRAVENOUS
  Filled 2020-11-07: qty 1

## 2020-11-07 MED ORDER — METOPROLOL SUCCINATE ER 25 MG PO TB24
12.5000 mg | ORAL_TABLET | Freq: Every day | ORAL | Status: DC
  Administered 2020-11-08 – 2020-11-13 (×6): 12.5 mg via ORAL
  Filled 2020-11-07 (×6): qty 1

## 2020-11-07 MED ORDER — ACETAMINOPHEN 650 MG RE SUPP: 650.0000 mg | Freq: Four times a day (QID) | RECTAL | Status: DC | PRN

## 2020-11-07 MED ORDER — ENSURE ENLIVE PO LIQD: 237.0000 mL | Freq: Two times a day (BID) | ORAL | Status: DC

## 2020-11-07 MED ORDER — ACETAMINOPHEN 325 MG PO TABS
650.0000 mg | ORAL_TABLET | Freq: Four times a day (QID) | ORAL | Status: DC | PRN
  Administered 2020-11-12: 650 mg via ORAL
  Filled 2020-11-07: qty 2

## 2020-11-07 MED ORDER — ONDANSETRON HCL 4 MG/2ML IJ SOLN
4.0000 mg | Freq: Four times a day (QID) | INTRAMUSCULAR | Status: DC | PRN
  Administered 2020-11-07: 4 mg via INTRAVENOUS
  Filled 2020-11-07: qty 2

## 2020-11-07 MED ORDER — HYDROMORPHONE HCL 1 MG/ML IJ SOLN
0.5000 mg | Freq: Once | INTRAMUSCULAR | Status: AC
  Administered 2020-11-07: 0.5 mg via INTRAVENOUS
  Filled 2020-11-07: qty 1

## 2020-11-07 MED ORDER — HYDRALAZINE HCL 20 MG/ML IJ SOLN
10.0000 mg | INTRAMUSCULAR | Status: DC | PRN
  Administered 2020-11-07: 20 mg via INTRAVENOUS
  Filled 2020-11-07: qty 1

## 2020-11-07 MED ORDER — ONDANSETRON HCL 4 MG PO TABS: 4.0000 mg | ORAL_TABLET | Freq: Four times a day (QID) | ORAL | Status: DC | PRN

## 2020-11-07 NOTE — Progress Notes (Signed)
I was contacted about patient's left shoulder fracture/dislocation. Dr. Alvino Chapel attempted a closed reduction in ED but notes that the patient's shoulder was immobile, and he had concerns about sedation. I reviewed patient's CT and xrays and have spoken with Dr. Tamera Punt. He feels that best course of action would be a closed and possible open reduction in the operating room tomorrow, given the complexity of her dislocation and fracture. The current plan is for the patient to get admitted to medicine, to be made NPO after midnight tonight, and to go the operating room tomorrow. Dr. Tamera Punt will discuss additional details of the plan tomorrow with Ms. Stage manager.

## 2020-11-07 NOTE — ED Triage Notes (Signed)
Pt presents to the ED via EMS for a fall and injury to the left shoulder prior to arrival. Per EMS, pt was standing upright and "her feet got tangled up" and fell onto her outstretched left arm. Pt hit her head when she fell and has an abrasion to the left temple. Pt does not take blood thinners and did not lose consciousness.  Per EMS pt has a hx of A-fib which she takes Metoprolol. Pt received 265mcg of Fentanyl en route IV via EMS.

## 2020-11-07 NOTE — ED Notes (Signed)
Pt back from imaging at this time.

## 2020-11-07 NOTE — H&P (Addendum)
History and Physical    TORIANNA Osborne SNK:[id number] DOB: Aug 25, 1931 DOA: 11/07/2020  PCP: Colon Branch, MD  Patient coming from: Home  I have personally briefly reviewed patient's old medical records in [place]  Chief Complaint: Fall, Shoulder pain  HPI: Crystal Osborne is a 87 y.o. female with medical history significant of Dementia, HTN, adult failure to thrive.  2d echo on 8/22 appeared to show new finding of hypertrophic cardiomyopathy (see Echo results).  Before this could be followed up as outpt with office visit and the recd cardiac MRI -> pt admitted to hospitalist service with delirium and acute resp failure from PNA 8/31-9/9.  This got better -> Went to SNF for rehab from 9/9-9/23.  Discharged from [facility] 9/23.  Today pt had mechanical fall at home, "legs got tangled up".  Caught self with outstretched L hand, no LOC, no syncope.  Has severe shoulder pain post fall, immobility.  Symptoms constant, nothing makes better or worse.  Symptoms persistent and severe.  No fevers, chills, dysuria.  ED Course: Has fracture dislocation of L shoulder as demonstrated on CT scan.  Also BP running high.  CXR today actually shows Bilateral interstitial thickening concerning for mild interstitial edema with a small left pleural effusion.   Review of Systems: As per HPI, otherwise all review of systems negative.  Past Medical History:  Diagnosis Date   AAA (abdominal aortic aneurysm) [facility])    sees Dr Eden Lathe    B12 deficiency    Cancer of right breast [facility]) [place] 11/28/2008   sees Dr Oneida Alar    COPD (chronic obstructive pulmonary disease) ([place]) 01/25/2014   CVA (cerebrovascular accident) ([place]) 2006   Diabetes mellitus ([place]) 05/29/2010   A1c 6.0 on October 2011    Hyperlipidemia    Hypertension    Osteoporosis 2003   Pneumonia ~ 2014; ~ 2015   Prolapse of female bladder, acquired    Shingles    repeated episone 9/11 went to a UC   TIA (transient  ischemic attack) ~ 2005   Transient hypothyroidism    d/t thyroidectomy   UTI (lower urinary tract infection) May 2013    Past Surgical History:  Procedure Laterality Date   ABDOMINAL HYSTERECTOMY     no oophorectomy   CATARACT EXTRACTION W/ INTRAOCULAR LENS  IMPLANT, BILATERAL Bilateral    DILATION AND CURETTAGE OF UTERUS     MASTECTOMY, RADICAL Right 1960s   remotely    PERIPHERAL VASCULAR CATHETERIZATION  12/03/2015   PERIPHERAL VASCULAR CATHETERIZATION N/A 12/03/2015   Procedure: Abdominal Aortogram;  Surgeon: Lorretta Harp, MD;  Location: [place] [facility];  Service: Cardiovascular;  Laterality: N/A;   PERIPHERAL VASCULAR CATHETERIZATION Bilateral 12/03/2015   Procedure: Lower Extremity Angiography;  Surgeon: Lorretta Harp, MD;  Location: [place] [facility];  Service: Cardiovascular;  Laterality: Bilateral;   PERIPHERAL VASCULAR CATHETERIZATION Bilateral 12/03/2015   Procedure: Peripheral Vascular Intervention;  Surgeon: Lorretta Harp, MD;  Location: [place] [facility];  Service: Cardiovascular;  Laterality: Bilateral;  24mmx58mm Lifestream bilateral Common Iliac Arteries   PERIPHERAL VASCULAR CATHETERIZATION Bilateral 12/03/2015   Procedure: Peripheral Vascular Atherectomy;  Surgeon: Lorretta Harp, MD;  Location: [place] [facility];  Service: Cardiovascular;  Laterality: Bilateral;  Common Iliac Arteries   THYROIDECTOMY       reports that she has been smoking cigarettes. She has a 33.50 pack-year smoking history. She has never used smokeless tobacco. She reports that she  does not drink alcohol and does not use drugs.  Allergies  Allergen Reactions   Septra [Sulfamethoxazole-Trimethoprim] Itching    Pt states she had a severe allergic reaction; she was in the ICU for 2 days because of taking this.   Ciprofloxacin Hcl Itching   Codeine Rash    Made her feel very strange    Family History  Problem Relation Age of Onset   Cancer Other        sister (lung) ,  brother (mouth)   Colon cancer Neg Hx    Coronary artery disease Neg Hx    Diabetes type II Neg Hx    Breast cancer Neg Hx      Prior to Admission medications   Medication Sig Start Date End Date Taking? Authorizing Provider  acetaminophen (TYLENOL) 500 MG tablet Take 1,000 mg by mouth 2 (two) times daily as needed (for pain.).    [provider]  albuterol (VENTOLIN HFA) 108 (90 Base) MCG/ACT inhaler Inhale 2 puffs into the lungs every 6 (six) hours as needed for wheezing or shortness of breath. 11/03/20   Medina-Vargas, Monina C, NP  aspirin EC 81 MG tablet Take 81 mg by mouth every evening.    [provider]  atorvastatin (LIPITOR) 40 MG tablet TAKE 1 TABLET(40 MG) BY MOUTH DAILY 11/03/20   Medina-Vargas, Monina C, NP  bisacodyl (DULCOLAX) 10 MG suppository Place 10 mg rectally as needed for moderate constipation.    [provider]  Magnesium Hydroxide (MILK OF MAGNESIA PO) Take by mouth as needed.    [provider]  METAMUCIL FIBER PO Take 1 packet by mouth 2 (two) times daily.    [provider]  metoprolol succinate (TOPROL XL) 25 MG 24 hr tablet Take 0.5 tablets (12.5 mg total) by mouth daily. 11/03/20   Medina-Vargas, Monina C, NP  Sodium Phosphates (RA SALINE ENEMA RE) Place rectally as needed.    [provider]    Physical Exam: Vitals:   11/07/20 1830 11/07/20 1845 11/07/20 1900 11/07/20 2005  BP: (!) 193/94 (!) 188/97 (!) 187/85 (!) 188/104  Pulse: 77 72 72 71  Resp: 19 16 18 11   Temp:      SpO2: 96% 94% 95% 94%    Constitutional: NAD, calm, comfortable Eyes: PERRL, lids and conjunctivae normal, Lac L orbital ridge ENMT: Mucous membranes are moist. Posterior pharynx clear of any exudate or lesions.Normal dentition.  Neck: normal, supple, no masses, no thyromegaly Respiratory: clear to auscultation bilaterally, no wheezing, no crackles. Normal respiratory effort. No accessory muscle use.  Cardiovascular: Regular  rate and rhythm, no murmurs / rubs / gallops. No extremity edema. 2+ pedal pulses. No carotid bruits.  Abdomen: no tenderness, no masses palpated. No hepatosplenomegaly. Bowel sounds positive.  Musculoskeletal: TTP over proximal L shoulder. Skin: no rashes, lesions, ulcers. No induration Neurologic: CN 2-12 grossly intact. Sensation intact, DTR normal. Strength 5/5 in all 4.  Psychiatric: Normal judgment and insight. Alert and oriented x 3. Normal mood.    Labs on Admission: I have personally reviewed following labs and imaging studies  CBC: Recent Labs  Lab 11/07/20 1931  WBC 10.7*  HGB 12.6  HCT 38.5  MCV 93.2  PLT 540   Basic Metabolic Panel: No results for input(s): NA, K, CL, CO2, GLUCOSE, BUN, CREATININE, CALCIUM, MG, PHOS in the last 168 hours. GFR: Estimated Creatinine Clearance: 36.1 mL/min (by C-G formula based on SCr of 0.79 mg/dL). Liver Function Tests: No results for input(s): AST,  ALT, ALKPHOS, BILITOT, PROT, ALBUMIN in the last 168 hours. No results for input(s): LIPASE, AMYLASE in the last 168 hours. No results for input(s): AMMONIA in the last 168 hours. Coagulation Profile: No results for input(s): INR, PROTIME in the last 168 hours. Cardiac Enzymes: No results for input(s): CKTOTAL, CKMB, CKMBINDEX, TROPONINI in the last 168 hours. BNP (last 3 results) No results for input(s): PROBNP in the last 8760 hours. HbA1C: No results for input(s): HGBA1C in the last 72 hours. CBG: No results for input(s): GLUCAP in the last 168 hours. Lipid Profile: No results for input(s): CHOL, HDL, LDLCALC, TRIG, CHOLHDL, LDLDIRECT in the last 72 hours. Thyroid Function Tests: No results for input(s): TSH, T4TOTAL, FREET4, T3FREE, THYROIDAB in the last 72 hours. Anemia Panel: No results for input(s): VITAMINB12, FOLATE, FERRITIN, TIBC, IRON, RETICCTPCT in the last 72 hours. Urine analysis:    Component Value Date/Time   COLORURINE YELLOW 10/10/2020 1645   APPEARANCEUR HAZY  (A) 10/10/2020 1645   LABSPEC 1.010 10/10/2020 1645   PHURINE 5.0 10/10/2020 1645   GLUCOSEU NEGATIVE 10/10/2020 1645   GLUCOSEU NEGATIVE 08/21/2014 1027   HGBUR MODERATE (A) 10/10/2020 1645   HGBUR negative 09/25/2009 1034   BILIRUBINUR NEGATIVE 10/10/2020 1645   BILIRUBINUR Neg 05/26/2013 0827   KETONESUR NEGATIVE 10/10/2020 1645   PROTEINUR 100 (A) 10/10/2020 1645   UROBILINOGEN 0.2 08/21/2014 1027   NITRITE POSITIVE (A) 10/10/2020 1645   LEUKOCYTESUR NEGATIVE 10/10/2020 1645    Radiological Exams on Admission: CT Head Wo Contrast  Result Date: 11/07/2020 CLINICAL DATA:  [place], left shoulder injury EXAM: CT HEAD WITHOUT CONTRAST TECHNIQUE: Contiguous axial images were obtained from the base of the skull through the vertex without intravenous contrast. COMPARISON:  10/10/2020 FINDINGS: Brain: Stable confluent hypodensities throughout the periventricular white matter consistent with chronic small-vessel ischemic changes. Chronic lacunar infarcts are again seen within the left basal ganglia. No signs of acute infarct or hemorrhage. Lateral ventricles and remaining midline structures are unremarkable. No acute extra-axial fluid collections. No mass effect. Vascular: Stable atherosclerosis.  No hyperdense vessel. Skull: Normal. Negative for fracture or focal lesion. Sinuses/Orbits: No acute finding. Other: None. IMPRESSION: 1. Stable chronic ischemic changes.  No acute intracranial process. Electronically Signed   By: Randa Ngo M.D.   On: 11/07/2020 17:31   CT Cervical Spine Wo Contrast  Result Date: 11/07/2020 CLINICAL DATA:  [place], left shoulder injury EXAM: CT CERVICAL SPINE WITHOUT CONTRAST TECHNIQUE: Multidetector CT imaging of the cervical spine was performed without intravenous contrast. Multiplanar CT image reconstructions were also generated. COMPARISON:  None. FINDINGS: Alignment: There is mild retrolisthesis of C4 on C5 and C5 on C6 likely due to spondylosis and facet hypertrophy.  Otherwise alignment is anatomic. Skull base and vertebrae: No acute fracture. No primary bone lesion or focal pathologic process. Soft tissues and spinal canal: No prevertebral fluid or swelling. No visible canal hematoma. Disc levels: There is diffuse spondylosis from C3 through C7. Multilevel facet hypertrophy greatest at C2-3. Marked hypertrophic changes at the C1-2 interface. Upper chest: Airway is patent. Dependent atelectasis right upper lobe. Other: Reconstructed images demonstrate no additional findings. IMPRESSION: 1. No acute cervical spine fracture. 2. Multilevel degenerative changes. Electronically Signed   By: Randa Ngo M.D.   On: 11/07/2020 17:35   CT Shoulder Left Wo Contrast  Result Date: 11/07/2020 CLINICAL DATA:  Shoulder trauma, scapular fracture EXAM: CT OF THE UPPER LEFT EXTREMITY WITHOUT CONTRAST TECHNIQUE: Multidetector CT imaging of the upper left extremity was performed according to  the standard protocol. COMPARISON:  None. FINDINGS: Bones/Joint/Cartilage Comminuted fracture of the left greater tuberosity with 2 cm of lateral displacement. Anterior shoulder dislocation with the humeral head perched on the anterior glenoid neck. No other acute fracture or dislocation. No aggressive osseous lesion. Mild arthropathy of the acromioclavicular joint. Ligaments Ligaments are suboptimally evaluated by CT. Muscles and Tendons Muscles are normal.  No muscle atrophy. Soft tissue No fluid collection or hematoma. No soft tissue mass. Thoracic aortic atherosclerosis. Carotid artery atherosclerosis. IMPRESSION: 1. Comminuted fracture of the left greater tuberosity with 2 cm of lateral displacement. Anterior shoulder dislocation with the humeral head perched on the anterior glenoid neck. Electronically Signed   By: Kathreen Devoid M.D.   On: 11/07/2020 19:37   DG Chest Portable 1 View  Result Date: 11/07/2020 CLINICAL DATA:  Status post fall, left shoulder pain EXAM: PORTABLE CHEST 1 VIEW  COMPARISON:  10/18/2020 FINDINGS: Mild bilateral interstitial thickening. Small left pleural effusion. Mild left basilar airspace disease concerning for atelectasis. Stable cardiomediastinal silhouette. No acute osseous abnormality. IMPRESSION: 1. Bilateral interstitial thickening concerning for mild interstitial edema with a small left pleural effusion. Electronically Signed   By: Kathreen Devoid M.D.   On: 11/07/2020 19:33   DG Shoulder Left  Result Date: 11/07/2020 CLINICAL DATA:  Fall EXAM: LEFT SHOULDER - 2+ VIEW COMPARISON:  None. FINDINGS: Anterior inferior dislocation of left humeral head with respect to the glenoid fossa. Comminuted displaced fracture involving the superolateral humeral head with probable fracture at the inferior glenoid. IMPRESSION: Anterior dislocation of the left humeral head with associated comminuted fracture of the superolateral humeral head and suspected fracture of the inferior glenoid Electronically Signed   By: Donavan Foil M.D.   On: 11/07/2020 17:05    EKG: Independently reviewed.  Assessment/Plan Principal Problem:   Fracture dislocation of left shoulder joint, closed, initial encounter Active Problems:   Essential hypertension   History of thyroidectomy   Hypertrophic cardiomyopathy (HCC)   Dysphagia   Dementia without behavioral disturbance (HCC)    Fracture dislocation of L shoulder - Plan per ortho = ORIF in hospital, possibly tomorrow NPO after MN Need cardiac clearance given the HCM new diagnosis (possibly with new onset CHF findings based on CXR). HCM - New / recent diagnosis on echo (see HPI). Tele monitor EKG Message sent to P. Trent for cards eval / clearance for surgery in AM ? New onset CHF - Mild at this point Will start by trying to control BP: HTN - Hydralazine IV PRN Cont metoprolol Dementia - Chronic and baseline High risk for delirium during stay (had during PNA admit earlier this month) Note: Tremor during last admit  attributed to rocephin / haldol H/o Dysphagia - Apparently resolved during stay at SNF, re-evaled by SLP: was on regular diet with thin liquids at time of DC from SNF on the 23rd H/o Thyroidectomy - Med rec this admit pending. Dont see that she is on any thyroid replacement therapy? Despite this, TSH was normal on 9/1  DVT prophylaxis: SCDs Code Status: DNR Family Communication: No family in room Disposition Plan: TBD, probably SNF after ORIF Consults called: Message sent to P. Trent for AM cards eval Admission status: Admit to inpatient  Severity of Illness: The appropriate patient status for this patient is INPATIENT. Inpatient status is judged to be reasonable and necessary in order to provide the required intensity of service to ensure the patient's safety. The patient's presenting symptoms, physical exam findings, and initial radiographic and laboratory data  in the context of their chronic comorbidities is felt to place them at high risk for further clinical deterioration. Furthermore, it is not anticipated that the patient will be medically stable for discharge from the hospital within 2 midnights of admission. The following factors support the patient status of inpatient.   IP status for ORIF of shoulder fracture-dislocation.  Further complicated by presence of HOCM, need for cards eval and clearance.  * I certify that at the point of admission it is my clinical judgment that the patient will require inpatient hospital care spanning beyond 2 midnights from the point of admission due to high intensity of service, high risk for further deterioration and high frequency of surveillance required.*   Naaman Curro M. DO [facility]  How to contact the [facility] Attending or Consulting provider [place] or covering provider during after hours [place], for this patient?  Check the care team in [facility] and look for a) attending/consulting [facility] provider listed and b) the [facility] team listed Log  into [email or web address]  Amion Physician Scheduling and messaging for groups and whole hospitals  On call and physician scheduling software for group practices, residents, hospitalists and other medical providers for call, clinic, rotation and shift schedules. OnCall [employer] is a hospital-wide system for scheduling doctors and paging doctors on call. EasyPlot is for scientific plotting and data analysis.  [email or web address]  and use [place]'s universal password to access. If you do not have the password, please contact the hospital operator.  Locate the [facility] provider you are looking for under [facility] and page to a number that you can be directly reached. If you still have difficulty reaching the provider, please page the [facility] (Director on Call) for the Hospitalists listed on amion for assistance.  11/07/2020, 8:31 PM

## 2020-11-07 NOTE — ED Provider Notes (Signed)
[facility] DEPT Provider Note   CSN: [id number] Arrival date & time: 11/07/20  1556     History Chief Complaint  Patient presents with   Fall    W/ left shoulder injury    Crystal Osborne is a 87 y.o. female.   Fall Pertinent negatives include no chest pain and no abdominal pain. Patient presents after a fall.  Lost her footing tripped her feet and fell down.  Pain in her left shoulder.  Abrasion to left head.  Not on blood thinners.  Recently got out of the hospital for pneumonia.  States her breathing has been doing well.  Has not been ambulatory since the fall.  Complaining only of the wound on her head and left shoulder pain.  No back pain.  No loss conscious.     Past Medical History:  Diagnosis Date   AAA (abdominal aortic aneurysm) [facility])    sees Dr Eden Lathe    B12 deficiency    Cancer of right breast [facility]) [place] 11/28/2008   sees Dr Oneida Alar    COPD (chronic obstructive pulmonary disease) ([place]) 01/25/2014   CVA (cerebrovascular accident) ([place]) 2006   Diabetes mellitus ([place]) 05/29/2010   A1c 6.0 on October 2011    Hyperlipidemia    Hypertension    Osteoporosis 2003   Pneumonia ~ 2014; ~ 2015   Prolapse of female bladder, acquired    Shingles    repeated episone 9/11 went to a UC   TIA (transient ischemic attack) ~ 2005   Transient hypothyroidism    d/t thyroidectomy   UTI (lower urinary tract infection) May 2013    Patient Active Problem List   Diagnosis Date Noted   Dysphagia 11/07/2020   Fracture dislocation of left shoulder joint, closed, initial encounter 11/07/2020   Dementia without behavioral disturbance ([place]) 11/07/2020   CAP (community acquired pneumonia) 29/47/6546   Acute metabolic encephalopathy 50/35/4656   Hypertrophic cardiomyopathy ([place]) 10/10/2020   Shingles 07/29/2017   Syncope 07/25/2016   Claudication ([place]) 12/03/2015   PCP NOTES >>>>> 01/10/2015   COPD (chronic obstructive pulmonary  disease) ([place]) 01/25/2014   B12 deficiency 01/25/2014   Anxiety and depression 07/26/2012   Insomnia 08/12/2011   Annual physical exam 05/29/2010   Hyperglycemia 05/29/2010   Diarrhea, chronic , on-off 05/29/2010   TOBACCO ABUSE 11/15/2009   Disorder of bladder 09/25/2009   SHOULDER PAIN 04/10/2009   PERSONAL HX BREAST CANCER 12/12/2008   Carotid artery disease ([place]) 11/28/2008   PVD (peripheral vascular disease) ([place]) 04/18/2008   BLIND LOOP SYNDROME 11/09/2007   DIVERTICULOSIS, COLON 10/14/2007   COLONIC POLYPS 08/03/2006   Essential hypertension 08/03/2006   CVA 08/03/2006   OSTEOPOROSIS NOS 08/03/2006   History of thyroidectomy 08/03/2006   Hyperlipidemia 03/05/2006   Abdominal aortic aneurysm ([place]) 03/05/2006    Past Surgical History:  Procedure Laterality Date   ABDOMINAL HYSTERECTOMY     no oophorectomy   CATARACT EXTRACTION W/ INTRAOCULAR LENS  IMPLANT, BILATERAL Bilateral    DILATION AND CURETTAGE OF UTERUS     MASTECTOMY, RADICAL Right 1960s   remotely    PERIPHERAL VASCULAR CATHETERIZATION  12/03/2015   PERIPHERAL VASCULAR CATHETERIZATION N/A 12/03/2015   Procedure: Abdominal Aortogram;  Surgeon: Lorretta Harp, MD;  Location: [place] CV LAB;  Service: Cardiovascular;  Laterality: N/A;   PERIPHERAL VASCULAR CATHETERIZATION Bilateral 12/03/2015   Procedure: Lower Extremity Angiography;  Surgeon: Lorretta Harp, MD;  Location: [place] CV LAB;  Service: Cardiovascular;  Laterality: Bilateral;   PERIPHERAL VASCULAR CATHETERIZATION Bilateral 12/03/2015   Procedure: Peripheral Vascular Intervention;  Surgeon: Lorretta Harp, MD;  Location: [place] [facility] LAB;  Service: Cardiovascular;  Laterality: Bilateral;  76mmx58mm [place] bilateral Common Iliac Arteries   PERIPHERAL VASCULAR CATHETERIZATION Bilateral 12/03/2015   Procedure: Peripheral Vascular Atherectomy;  Surgeon: Lorretta Harp, MD;  Location: [place];  Service: Cardiovascular;   Laterality: Bilateral;  Common Iliac Arteries   THYROIDECTOMY       OB History   No obstetric history on file.     Family History  Problem Relation Age of Onset   Cancer Other        sister (lung) , brother (mouth)   Colon cancer Neg Hx    Coronary artery disease Neg Hx    Diabetes type II Neg Hx    Breast cancer Neg Hx     Social History   Tobacco Use   Smoking status: Every Day    Packs/day: 0.50    Years: 67.00    Pack years: 33.50    Types: Cigarettes   Smokeless tobacco: Never   Tobacco comments:    1/2 ppd  Substance Use Topics   Alcohol use: No    Alcohol/week: 0.0 standard drinks   Drug use: No    Home Medications Prior to Admission medications   Medication Sig Start Date End Date Taking? Authorizing Provider  acetaminophen (TYLENOL) 500 MG tablet Take 1,000 mg by mouth 2 (two) times daily as needed for moderate pain (for pain.).   Yes [provider]  aspirin EC 81 MG tablet Take 81 mg by mouth every evening.   Yes [provider]  atorvastatin (LIPITOR) 40 MG tablet TAKE 1 TABLET(40 MG) BY MOUTH DAILY Patient taking differently: Take 40 mg by mouth daily. 11/03/20  Yes Medina-Vargas, Monina C, NP  hydroxypropyl methylcellulose / hypromellose (ISOPTO TEARS / GONIOVISC) 2.5 % ophthalmic solution Place 1 drop into both eyes 2 (two) times daily as needed for dry eyes.   Yes [provider]  Magnesium Hydroxide (MILK OF MAGNESIA PO) Take 1 Dose by mouth daily as needed (constipation).   Yes [provider]  METAMUCIL FIBER PO Take 1 packet by mouth daily.   Yes [provider]  metoprolol succinate (TOPROL XL) 25 MG 24 hr tablet Take 0.5 tablets (12.5 mg total) by mouth daily. Patient taking differently: Take 12.5 mg by mouth 2 (two) times daily. 11/03/20  Yes Medina-Vargas, Monina C, NP  Sodium Phosphates (RA SALINE ENEMA RE) Place 1 Dose rectally daily as needed (constipation).   Yes [provider]  vitamin  B-12 (CYANOCOBALAMIN) 1000 MCG tablet Take 1,000 mcg by mouth daily.   Yes [provider]  albuterol (VENTOLIN HFA) 108 (90 Base) MCG/ACT inhaler Inhale 2 puffs into the lungs every 6 (six) hours as needed for wheezing or shortness of breath. 11/03/20   Medina-Vargas, Monina C, NP    Allergies    Septra [sulfamethoxazole-trimethoprim], Ciprofloxacin hcl, and Codeine  Review of Systems   Review of Systems  Constitutional:  Negative for appetite change.  HENT:  Negative for congestion.   Respiratory:  Positive for cough.   Cardiovascular:  Negative for chest pain.  Gastrointestinal:  Negative for abdominal pain.  Genitourinary:  Negative for flank pain.  Musculoskeletal:  Positive for neck pain. Negative for back pain.       Left shoulder pain.  Neurological:  Negative for weakness.  Psychiatric/Behavioral:  Negative for confusion.    Physical Exam Updated Vital Signs BP (!) 193/85 (BP Location: Right Arm)   Pulse 75   Temp 97.7 F (36.5 C) (Oral)   Resp 17   SpO2 96%   Physical Exam Vitals and nursing note reviewed.  HENT:     Head: Normocephalic.     Comments: Laceartion left orbital ridge. Eyes:     Pupils: Pupils are equal, round, and reactive to light.  Cardiovascular:     Rate and Rhythm: Regular rhythm.  Pulmonary:     Breath sounds: No wheezing or rhonchi.  Abdominal:     Tenderness: There is no abdominal tenderness.  Musculoskeletal:        General: Tenderness present.     Cervical back: Neck supple. No tenderness.     Comments: Tenderness over proximal left shoulder.  No tenderness over elbow.  Neurovascular tact in left hand.  No wrist pain.  Skin:    General: Skin is warm.     Capillary Refill: Capillary refill takes less than 2 seconds.  Neurological:     Mental Status: She is alert and oriented to person, place, and time.    ED Results / Procedures / Treatments   Labs (all labs ordered are listed, but only abnormal results are  displayed) Labs Reviewed  BASIC METABOLIC PANEL - Abnormal; Notable for the following components:      Result Value   Potassium 5.5 (*)    Glucose, Bld 105 (*)    Calcium 8.1 (*)    All other components within normal limits  CBC - Abnormal; Notable for the following components:   WBC 10.7 (*)    All other components within normal limits  RESP PANEL BY RT-PCR (FLU A&B, COVID) ARPGX2  TYPE AND SCREEN    EKG None  Radiology CT Head Wo Contrast  Result Date: 11/07/2020 CLINICAL DATA:  [place], left shoulder injury EXAM: CT HEAD WITHOUT CONTRAST TECHNIQUE: Contiguous axial images were obtained from the base of the skull through the vertex without intravenous contrast. COMPARISON:  10/10/2020 FINDINGS: Brain: Stable confluent hypodensities throughout the periventricular white matter consistent with chronic small-vessel ischemic changes. Chronic lacunar infarcts are again seen within the left basal ganglia. No signs of acute infarct or hemorrhage. Lateral ventricles and remaining midline structures are unremarkable. No acute extra-axial fluid collections. No mass effect. Vascular: Stable atherosclerosis.  No hyperdense vessel. Skull: Normal. Negative for fracture or focal lesion. Sinuses/Orbits: No acute finding. Other: None. IMPRESSION: 1. Stable chronic ischemic changes.  No acute intracranial process. Electronically Signed   By: Randa Ngo M.D.   On: 11/07/2020 17:31   CT Cervical Spine Wo Contrast  Result Date: 11/07/2020 CLINICAL DATA:  [place], left shoulder injury EXAM: CT CERVICAL SPINE WITHOUT CONTRAST TECHNIQUE: Multidetector CT imaging of the cervical spine was performed without intravenous contrast. Multiplanar CT image reconstructions were also generated. COMPARISON:  None. FINDINGS: Alignment: There is mild retrolisthesis of C4 on C5 and C5 on C6 likely due to spondylosis and facet hypertrophy. Otherwise alignment is anatomic. Skull base and vertebrae: No acute fracture. No primary bone  lesion or focal pathologic process. Soft tissues and spinal canal: No prevertebral fluid or swelling. No visible canal hematoma. Disc levels: There is diffuse spondylosis from C3 through C7. Multilevel facet hypertrophy greatest at C2-3. Marked hypertrophic changes at the C1-2 interface. Upper chest: Airway is patent. Dependent atelectasis right upper lobe. Other: Reconstructed images demonstrate no additional findings. IMPRESSION: 1. No acute cervical spine fracture.  2. Multilevel degenerative changes. Electronically Signed   By: Randa Ngo M.D.   On: 11/07/2020 17:35   CT Shoulder Left Wo Contrast  Result Date: 11/07/2020 CLINICAL DATA:  Shoulder trauma, scapular fracture EXAM: CT OF THE UPPER LEFT EXTREMITY WITHOUT CONTRAST TECHNIQUE: Multidetector CT imaging of the upper left extremity was performed according to the standard protocol. COMPARISON:  None. FINDINGS: Bones/Joint/Cartilage Comminuted fracture of the left greater tuberosity with 2 cm of lateral displacement. Anterior shoulder dislocation with the humeral head perched on the anterior glenoid neck. No other acute fracture or dislocation. No aggressive osseous lesion. Mild arthropathy of the acromioclavicular joint. Ligaments Ligaments are suboptimally evaluated by CT. Muscles and Tendons Muscles are normal.  No muscle atrophy. Soft tissue No fluid collection or hematoma. No soft tissue mass. Thoracic aortic atherosclerosis. Carotid artery atherosclerosis. IMPRESSION: 1. Comminuted fracture of the left greater tuberosity with 2 cm of lateral displacement. Anterior shoulder dislocation with the humeral head perched on the anterior glenoid neck. Electronically Signed   By: Kathreen Devoid M.D.   On: 11/07/2020 19:37   DG Chest Portable 1 View  Result Date: 11/07/2020 CLINICAL DATA:  Status post fall, left shoulder pain EXAM: PORTABLE CHEST 1 VIEW COMPARISON:  10/18/2020 FINDINGS: Mild bilateral interstitial thickening. Small left pleural effusion.  Mild left basilar airspace disease concerning for atelectasis. Stable cardiomediastinal silhouette. No acute osseous abnormality. IMPRESSION: 1. Bilateral interstitial thickening concerning for mild interstitial edema with a small left pleural effusion. Electronically Signed   By: Kathreen Devoid M.D.   On: 11/07/2020 19:33   DG Shoulder Left  Result Date: 11/07/2020 CLINICAL DATA:  Fall EXAM: LEFT SHOULDER - 2+ VIEW COMPARISON:  None. FINDINGS: Anterior inferior dislocation of left humeral head with respect to the glenoid fossa. Comminuted displaced fracture involving the superolateral humeral head with probable fracture at the inferior glenoid. IMPRESSION: Anterior dislocation of the left humeral head with associated comminuted fracture of the superolateral humeral head and suspected fracture of the inferior glenoid Electronically Signed   By: Donavan Foil M.D.   On: 11/07/2020 17:05    Procedures Procedures   Medications Ordered in ED Medications  HYDROmorphone (DILAUDID) injection 0.5-1 mg (1 mg Intravenous Given 11/07/20 2243)  metoprolol succinate (TOPROL-XL) 24 hr tablet 12.5 mg (has no administration in time range)  acetaminophen (TYLENOL) tablet 650 mg (has no administration in time range)    Or  acetaminophen (TYLENOL) suppository 650 mg (has no administration in time range)  ondansetron (ZOFRAN) tablet 4 mg ( Oral See Alternative 11/07/20 2303)    Or  ondansetron (ZOFRAN) injection 4 mg (4 mg Intravenous Given 11/07/20 2303)  hydrALAZINE (APRESOLINE) injection 10-20 mg (20 mg Intravenous Given 11/07/20 2242)  feeding supplement (ENSURE ENLIVE / ENSURE PLUS) liquid 237 mL (has no administration in time range)  fentaNYL (SUBLIMAZE) injection 50 mcg (50 mcg Intravenous Given 11/07/20 1824)  HYDROmorphone (DILAUDID) injection 0.5 mg (0.5 mg Intravenous Given 11/07/20 2001)    ED Course  I have reviewed the triage vital signs and the nursing notes.  Pertinent labs & imaging results that  were available during my care of the patient were reviewed by me and considered in my medical decision making (see chart for details).    MDM Rules/Calculators/A&P                           Patient with fall.  Hit head.  Small wound on forehead does not appear to need suturing.  Bleeding controlled.  However unfortunately has fracture of humeral head and dislocation out of the glenoid.  Unable to reduce easily in the ER.  Tried with pain medicine but patient is not an ideal candidate for ER sedation.  His recent hypoxia.  Also on anticoagulation.  CT scan done after discussion with Dr. Lynann Bologna from orthopedic surgery.  Likely will have close reduction and possible surgery tomorrow.  N.p.o. at midnight.  Medical maximization prior to this.  Is on anticoagulation.  Had her last Eliquis dose this morning Final Clinical Impression(s) / ED Diagnoses Final diagnoses:  Fall, initial encounter  Closed fracture dislocation of joint of left shoulder girdle, initial encounter    Rx / DC Orders ED Discharge Orders     None        Davonna Belling, MD 11/07/20 2325

## 2020-11-07 NOTE — ED Notes (Signed)
Pt given crackers and juice

## 2020-11-07 NOTE — ED Notes (Signed)
Son updated that pt will be transferred to 4E

## 2020-11-08 ENCOUNTER — Inpatient Hospital Stay (HOSPITAL_COMMUNITY): Payer: Medicare Other | Admitting: Anesthesiology

## 2020-11-08 ENCOUNTER — Inpatient Hospital Stay (HOSPITAL_COMMUNITY): Payer: Medicare Other

## 2020-11-08 ENCOUNTER — Encounter (HOSPITAL_COMMUNITY)
Admission: EM | Disposition: A | Discharge status: Skilled Nursing Facility | Payer: Self-pay | Source: Home / Self Care | Attending: Family Medicine

## 2020-11-08 ENCOUNTER — Other Ambulatory Visit: Payer: Self-pay

## 2020-11-08 ENCOUNTER — Telehealth: Payer: Self-pay | Admitting: Internal Medicine

## 2020-11-08 ENCOUNTER — Encounter (HOSPITAL_COMMUNITY): Payer: Self-pay | Admitting: Internal Medicine

## 2020-11-08 DIAGNOSIS — I421 Obstructive hypertrophic cardiomyopathy: Secondary | ICD-10-CM | POA: Diagnosis not present

## 2020-11-08 DIAGNOSIS — I340 Nonrheumatic mitral (valve) insufficiency: Secondary | ICD-10-CM

## 2020-11-08 DIAGNOSIS — I10 Essential (primary) hypertension: Secondary | ICD-10-CM | POA: Diagnosis not present

## 2020-11-08 DIAGNOSIS — S4292XA Fracture of left shoulder girdle, part unspecified, initial encounter for closed fracture: Secondary | ICD-10-CM | POA: Diagnosis not present

## 2020-11-08 DIAGNOSIS — E890 Postprocedural hypothyroidism: Secondary | ICD-10-CM | POA: Diagnosis not present

## 2020-11-08 DIAGNOSIS — F0390 Unspecified dementia without behavioral disturbance: Secondary | ICD-10-CM | POA: Diagnosis not present

## 2020-11-08 DIAGNOSIS — Z01810 Encounter for preprocedural cardiovascular examination: Secondary | ICD-10-CM | POA: Diagnosis not present

## 2020-11-08 DIAGNOSIS — I272 Pulmonary hypertension, unspecified: Secondary | ICD-10-CM

## 2020-11-08 HISTORY — PX: SHOULDER CLOSED REDUCTION: SHX1051

## 2020-11-08 LAB — BASIC METABOLIC PANEL
Anion gap: 5 (ref 5–15)
BUN: 15 mg/dL (ref 8–23)
CO2: 26 mmol/L (ref 22–32)
Calcium: 8.8 mg/dL — ABNORMAL LOW (ref 8.9–10.3)
Chloride: 108 mmol/L (ref 98–111)
Creatinine, Ser: 0.49 mg/dL (ref 0.44–1.00)
GFR, Estimated: >60 mL/min (ref >60)
Glucose, Bld: 116 mg/dL — ABNORMAL HIGH (ref 70–99)
Potassium: 3.8 mmol/L (ref 3.5–5.1)
Sodium: 139 mmol/L (ref 135–145)

## 2020-11-08 LAB — BRAIN NATRIURETIC PEPTIDE: B Natriuretic Peptide: 2830.3 pg/mL — ABNORMAL HIGH (ref 0.0–100.0)

## 2020-11-08 LAB — ABO/RH: ABO/RH(D): AB POS

## 2020-11-08 LAB — CBC
HCT: 38.9 % (ref 36.0–46.0)
Hemoglobin: 12.8 g/dL (ref 12.0–15.0)
MCH: 30.6 pg (ref 26.0–34.0)
MCHC: 32.9 g/dL (ref 30.0–36.0)
MCV: 93.1 fL (ref 80.0–100.0)
Platelets: 260 10*3/uL (ref 150–400)
RBC: 4.18 MIL/uL (ref 3.87–5.11)
RDW: 14.4 % (ref 11.5–15.5)
WBC: 11.4 10*3/uL — ABNORMAL HIGH (ref 4.0–10.5)
nRBC: 0 % (ref 0.0–0.2)

## 2020-11-08 LAB — SURGICAL PCR SCREEN
MRSA, PCR: NEGATIVE
Staphylococcus aureus: NEGATIVE

## 2020-11-08 LAB — GLUCOSE, CAPILLARY: Glucose-Capillary: 106 mg/dL — ABNORMAL HIGH (ref 70–99)

## 2020-11-08 SURGERY — MANIPULATION, JOINT, SHOULDER, WITH ANESTHESIA
Anesthesia: Regional | Site: Shoulder | Laterality: Left

## 2020-11-08 MED ORDER — FENTANYL CITRATE PF 50 MCG/ML IJ SOSY
PREFILLED_SYRINGE | INTRAMUSCULAR | Status: AC
  Administered 2020-11-08: 25 ug via INTRAVENOUS
  Filled 2020-11-08: qty 1

## 2020-11-08 MED ORDER — MUPIROCIN 2 % EX OINT
1.0000 "application " | TOPICAL_OINTMENT | Freq: Two times a day (BID) | CUTANEOUS | Status: AC
  Administered 2020-11-08 – 2020-11-12 (×10): 1 via NASAL
  Filled 2020-11-08: qty 22

## 2020-11-08 MED ORDER — ATORVASTATIN CALCIUM 40 MG PO TABS
40.0000 mg | ORAL_TABLET | Freq: Every day | ORAL | Status: DC
  Administered 2020-11-08 – 2020-11-13 (×6): 40 mg via ORAL
  Filled 2020-11-08 (×7): qty 1

## 2020-11-08 MED ORDER — ONDANSETRON HCL 4 MG PO TABS: 4.0000 mg | ORAL_TABLET | Freq: Four times a day (QID) | ORAL | Status: DC | PRN

## 2020-11-08 MED ORDER — ROPIVACAINE HCL 5 MG/ML IJ SOLN
INTRAMUSCULAR | Status: DC | PRN
  Administered 2020-11-08: 20 mL via PERINEURAL

## 2020-11-08 MED ORDER — ADULT MULTIVITAMIN W/MINERALS CH
1.0000 | ORAL_TABLET | Freq: Every day | ORAL | Status: DC
  Administered 2020-11-09 – 2020-11-13 (×5): 1 via ORAL
  Filled 2020-11-08 (×5): qty 1

## 2020-11-08 MED ORDER — SODIUM CHLORIDE 0.9 % IV SOLN: INTRAVENOUS | Status: DC

## 2020-11-08 MED ORDER — ACETAMINOPHEN 500 MG PO TABS
1000.0000 mg | ORAL_TABLET | Freq: Two times a day (BID) | ORAL | Status: DC | PRN
  Administered 2020-11-10 – 2020-11-12 (×2): 1000 mg via ORAL
  Filled 2020-11-08 (×2): qty 2

## 2020-11-08 MED ORDER — ONDANSETRON HCL 4 MG/2ML IJ SOLN: 4.0000 mg | Freq: Four times a day (QID) | INTRAMUSCULAR | Status: DC | PRN

## 2020-11-08 MED ORDER — HYPROMELLOSE (GONIOSCOPIC) 2.5 % OP SOLN: 1.0000 [drp] | Freq: Two times a day (BID) | OPHTHALMIC | Status: DC | PRN

## 2020-11-08 MED ORDER — FENTANYL CITRATE PF 50 MCG/ML IJ SOSY: 25.0000 ug | PREFILLED_SYRINGE | INTRAMUSCULAR | Status: DC | PRN

## 2020-11-08 MED ORDER — HYDRALAZINE HCL 25 MG PO TABS
25.0000 mg | ORAL_TABLET | Freq: Two times a day (BID) | ORAL | Status: DC
  Administered 2020-11-08 – 2020-11-09 (×2): 25 mg via ORAL
  Filled 2020-11-08 (×2): qty 1

## 2020-11-08 MED ORDER — MENTHOL 3 MG MT LOZG: 1.0000 | LOZENGE | OROMUCOSAL | Status: DC | PRN

## 2020-11-08 MED ORDER — ASPIRIN EC 81 MG PO TBEC
81.0000 mg | DELAYED_RELEASE_TABLET | Freq: Every evening | ORAL | Status: DC
  Administered 2020-11-08 – 2020-11-13 (×6): 81 mg via ORAL
  Filled 2020-11-08 (×6): qty 1

## 2020-11-08 MED ORDER — FENTANYL CITRATE PF 50 MCG/ML IJ SOSY: 25.0000 ug | PREFILLED_SYRINGE | INTRAMUSCULAR | Status: AC

## 2020-11-08 MED ORDER — DOCUSATE SODIUM 100 MG PO CAPS
100.0000 mg | ORAL_CAPSULE | Freq: Two times a day (BID) | ORAL | Status: DC
  Administered 2020-11-08 – 2020-11-13 (×9): 100 mg via ORAL
  Filled 2020-11-08 (×10): qty 1

## 2020-11-08 MED ORDER — DROPERIDOL 2.5 MG/ML IJ SOLN: 0.6250 mg | Freq: Once | INTRAMUSCULAR | Status: DC | PRN

## 2020-11-08 MED ORDER — PHENOL 1.4 % MT LIQD: 1.0000 | OROMUCOSAL | Status: DC | PRN

## 2020-11-08 MED ORDER — VITAMIN B-12 1000 MCG PO TABS
1000.0000 ug | ORAL_TABLET | Freq: Every day | ORAL | Status: DC
  Administered 2020-11-08 – 2020-11-13 (×6): 1000 ug via ORAL
  Filled 2020-11-08 (×6): qty 1

## 2020-11-08 MED ORDER — LACTATED RINGERS IV SOLN: INTRAVENOUS | Status: DC

## 2020-11-08 MED ORDER — BOOST / RESOURCE BREEZE PO LIQD CUSTOM
1.0000 | ORAL | Status: DC
  Administered 2020-11-10 – 2020-11-12 (×2): 1 via ORAL

## 2020-11-08 MED ORDER — ENSURE SURGERY PO LIQD
237.0000 mL | Freq: Two times a day (BID) | ORAL | Status: DC
  Administered 2020-11-08 – 2020-11-13 (×3): 237 mL via ORAL
  Filled 2020-11-08 (×12): qty 237

## 2020-11-08 MED ORDER — DEXMEDETOMIDINE (PRECEDEX) IN NS 20 MCG/5ML (4 MCG/ML) IV SYRINGE
PREFILLED_SYRINGE | INTRAVENOUS | Status: DC | PRN
  Administered 2020-11-08: 4 ug via INTRAVENOUS
  Administered 2020-11-08: 8 ug via INTRAVENOUS

## 2020-11-08 MED ORDER — ONDANSETRON HCL 4 MG/2ML IJ SOLN
INTRAMUSCULAR | Status: AC
  Filled 2020-11-08: qty 2

## 2020-11-08 MED ORDER — POLYVINYL ALCOHOL 1.4 % OP SOLN: 1.0000 [drp] | OPHTHALMIC | Status: DC | PRN

## 2020-11-08 MED ORDER — ALBUTEROL SULFATE (2.5 MG/3ML) 0.083% IN NEBU: 3.0000 mL | INHALATION_SOLUTION | Freq: Four times a day (QID) | RESPIRATORY_TRACT | Status: DC | PRN

## 2020-11-08 SURGICAL SUPPLY — 56 items
AID PSTN UNV HD RSTRNT DISP (MISCELLANEOUS) ×1
APL SKNCLS STERI-STRIP NONHPOA (GAUZE/BANDAGES/DRESSINGS)
BAG COUNTER SPONGE SURGICOUNT (BAG) IMPLANT
BAG SPNG CNTER NS LX DISP (BAG)
BENZOIN TINCTURE PRP APPL 2/3 (GAUZE/BANDAGES/DRESSINGS) IMPLANT
BLADE HEX COATED 2.75 (ELECTRODE) ×2 IMPLANT
BLADE SURG 15 STRL LF DISP TIS (BLADE) ×1 IMPLANT
BLADE SURG 15 STRL SS (BLADE) ×2
CLSR STERI-STRIP ANTIMIC 1/2X4 (GAUZE/BANDAGES/DRESSINGS) IMPLANT
COVER SURGICAL LIGHT HANDLE (MISCELLANEOUS) ×2 IMPLANT
DECANTER SPIKE VIAL GLASS SM (MISCELLANEOUS) IMPLANT
DRAPE C-ARM 42X120 X-RAY (DRAPES) ×2 IMPLANT
DRAPE INCISE IOBAN 66X45 STRL (DRAPES) ×2 IMPLANT
DRAPE ORTHO SPLIT 77X108 STRL (DRAPES) ×4
DRAPE SHEET LG 3/4 BI-LAMINATE (DRAPES) IMPLANT
DRAPE SURG ORHT 6 SPLT 77X108 (DRAPES) ×2 IMPLANT
DRAPE U-SHAPE 47X51 STRL (DRAPES) ×4 IMPLANT
DURAPREP 26ML APPLICATOR (WOUND CARE) ×2 IMPLANT
ELECT REM PT RETURN 15FT ADLT (MISCELLANEOUS) ×2 IMPLANT
GAUZE SPONGE 4X4 12PLY STRL (GAUZE/BANDAGES/DRESSINGS) ×2 IMPLANT
GLOVE SRG 8 PF TXTR STRL LF DI (GLOVE) ×1 IMPLANT
GLOVE SURG ENC MOIS LTX SZ7.5 (GLOVE) ×2 IMPLANT
GLOVE SURG ENC MOIS LTX SZ8.5 (GLOVE) ×2 IMPLANT
GLOVE SURG POLYISO LF SZ6.5 (GLOVE) ×2 IMPLANT
GLOVE SURG UNDER POLY LF SZ6.5 (GLOVE) ×2 IMPLANT
GLOVE SURG UNDER POLY LF SZ8 (GLOVE) ×2
GOWN STRL REUS W/ TWL XL LVL3 (GOWN DISPOSABLE) ×1 IMPLANT
GOWN STRL REUS W/TWL LRG LVL3 (GOWN DISPOSABLE) ×2 IMPLANT
GOWN STRL REUS W/TWL XL LVL3 (GOWN DISPOSABLE) ×4 IMPLANT
KIT BASIN OR (CUSTOM PROCEDURE TRAY) ×2 IMPLANT
KIT TURNOVER KIT A (KITS) ×2 IMPLANT
NS IRRIG 1000ML POUR BTL (IV SOLUTION) IMPLANT
PACK SHOULDER (CUSTOM PROCEDURE TRAY) ×2 IMPLANT
PENCIL SMOKE EVACUATOR (MISCELLANEOUS) IMPLANT
PROTECTOR NERVE ULNAR (MISCELLANEOUS) ×2 IMPLANT
RESTRAINT HEAD UNIVERSAL NS (MISCELLANEOUS) ×2 IMPLANT
RETRIEVER SUT HEWSON (MISCELLANEOUS) ×2 IMPLANT
SLING ARM IMMOBILIZER LRG (SOFTGOODS) IMPLANT
SLING ARM IMMOBILIZER MED (SOFTGOODS) IMPLANT
SPONGE T-LAP 18X18 ~~LOC~~+RFID (SPONGE) ×2 IMPLANT
SPONGE T-LAP 4X18 ~~LOC~~+RFID (SPONGE) ×2 IMPLANT
SUCTION FRAZIER HANDLE 10FR (MISCELLANEOUS)
SUCTION TUBE FRAZIER 10FR DISP (MISCELLANEOUS) IMPLANT
SUPPORT WRAP ARM LG (MISCELLANEOUS) ×2 IMPLANT
SUT ETHIBOND 2 OS 4 DA (SUTURE) IMPLANT
SUT FIBERWIRE #2 38 T-5 BLUE (SUTURE)
SUT MNCRL AB 4-0 PS2 18 (SUTURE) ×2 IMPLANT
SUT VIC AB 0 CT1 27 (SUTURE) ×4
SUT VIC AB 0 CT1 27XBRD ANTBC (SUTURE) ×2 IMPLANT
SUT VIC AB 1 CT1 27 (SUTURE) ×4
SUT VIC AB 1 CT1 27XBRD ANTBC (SUTURE) ×2 IMPLANT
SUT VIC AB 2-0 CT1 27 (SUTURE) ×6
SUT VIC AB 2-0 CT1 TAPERPNT 27 (SUTURE) ×3 IMPLANT
SUTURE FIBERWR #2 38 T-5 BLUE (SUTURE) IMPLANT
TOWEL OR 17X26 10 PK STRL BLUE (TOWEL DISPOSABLE) ×4 IMPLANT
TOWEL OR NON WOVEN STRL DISP B (DISPOSABLE) ×2 IMPLANT

## 2020-11-08 NOTE — Anesthesia Preprocedure Evaluation (Addendum)
Anesthesia Evaluation  Patient identified by MRN, date of birth, ID band  Reviewed: Allergy & Precautions, NPO status , Patient's Chart, lab work & pertinent test results, reviewed documented beta blocker date and time   Airway Mallampati: II  TM Distance: >3 FB Neck ROM: Full    Dental  (+) Dental Advisory Given, Missing   Pulmonary pneumonia, COPD, Current Smoker and Patient abstained from smoking.,    Pulmonary exam normal breath sounds clear to auscultation       Cardiovascular hypertension, Pt. on medications and Pt. on home beta blockers + Peripheral Vascular Disease  Normal cardiovascular exam Rhythm:Regular Rate:Normal  Echo 10/01/2020 1. Significant hypertrophy of LV apex with slit like cavity of LV apex. Frequent ectopy/bigeminy, makes assessment of LVEF difficult OVerall LVEF appears mildly decreased.. The left ventricular internal cavity size was mildly dilated.  2. Right ventricular systolic function is low normal. The right ventricular size is normal. There is mildly elevated pulmonary artery systolic pressure.  3. Left atrial size was mildly dilated.  4. Mild mitral valve regurgitation.  5. Tricuspid valve regurgitation is mild to moderate.  6. The aortic valve is tricuspid. Aortic valve regurgitation is not visualized. Mild to moderate aortic valve sclerosis/calcification is present, without any evidence of aortic stenosis.  7. The inferior vena cava is normal in size with greater than 50% respiratory variability, suggesting right atrial pressure of 3 mmHg.    Neuro/Psych PSYCHIATRIC DISORDERS Anxiety Depression Dementia TIACVA    GI/Hepatic negative GI ROS, Neg liver ROS,   Endo/Other  diabetesHypothyroidism   Renal/GU negative Renal ROS     Musculoskeletal negative musculoskeletal ROS (+)   Abdominal   Peds  Hematology negative hematology ROS (+)   Anesthesia Other Findings    Reproductive/Obstetrics                          Anesthesia Physical Anesthesia Plan  ASA: 4  Anesthesia Plan: Regional   Post-op Pain Management:    Induction: Intravenous  PONV Risk Score and Plan: 1 and Ondansetron, Treatment may vary due to age or medical condition, TIVA and Propofol infusion  Airway Management Planned: Natural Airway  Additional Equipment:   Intra-op Plan:   Post-operative Plan:   Informed Consent: I have reviewed the patients History and Physical, chart, labs and discussed the procedure including the risks, benefits and alternatives for the proposed anesthesia with the patient or authorized representative who has indicated his/her understanding and acceptance.   Patient has DNR.  Discussed DNR with patient, Discussed DNR with power of attorney and Suspend DNR.   Dental advisory given  Plan Discussed with: CRNA  Anesthesia Plan Comments: (Plan closed reduction under block. Discussed plan with POA (son Thena Devora) and limited code intraop.)       Anesthesia Quick Evaluation

## 2020-11-08 NOTE — Discharge Instructions (Signed)
Wear sling on left shoulder at all times  Follow up in office in 2 weeks with Dr Tamera Punt

## 2020-11-08 NOTE — Anesthesia Procedure Notes (Signed)
Anesthesia Regional Block: Interscalene brachial plexus block   Pre-Anesthetic Checklist: , timeout performed,  Correct Patient, Correct Site, Correct Laterality,  Correct Procedure, Correct Position, site marked,  Risks and benefits discussed,  Surgical consent,  Pre-op evaluation,  At surgeon's request and post-op pain management  Laterality: Upper and Left  Prep: chloraprep       Needles:  Injection technique: Single-shot  Needle Type: Stimulator Needle - 40     Needle Length: 4cm  Needle Gauge: 22     Additional Needles:   Procedures:,,,, ultrasound used (permanent image in chart),,    Narrative:  Start time: 11/08/2020 1:05 PM End time: 11/08/2020 1:25 PM Injection made incrementally with aspirations every 5 mL.  Performed by: Personally  Anesthesiologist: Nolon Nations, MD  Additional Notes: BP cuff, SpO2 and EKG monitors applied. Sedation begun. Nerve location verified with ultrasound. Anesthetic injected incrementally, slowly, and after neg aspirations under direct u/s guidance. Good perineural spread. Tolerated well.

## 2020-11-08 NOTE — Progress Notes (Addendum)
PROGRESS NOTE  JULIE NAY  TML:[id number] DOB: 1931-03-12 DOA: 11/07/2020 PCP: Colon Branch, MD   Brief Narrative: CYNDE MENARD is a 87 y.o. female with a history of AAA, right breast CA, COPD, CVA, HTN, HLD, prediabetes, admission for pneumonia complicated by delirium earlier this month, subsequently discharged to [facility] 9/9, then to home 9/23, and fell at home with [facility] and subsequent left shoulder fracture and dislocation not able to be reduced in ED. Orthopedics is consulted, recommending reduction (closed vs. open) in OR. Cardiology is consulted for recent echo suggesting apical hypertrophic cardiomyopathy and interstitial thickening and small left pleural effusion on CXR here suggestive of CHF.   Assessment & Plan: Principal Problem:   Fracture dislocation of left shoulder joint, closed, initial encounter Active Problems:   Essential hypertension   History of thyroidectomy   Hypertrophic cardiomyopathy (HCC)   Dysphagia   Dementia without behavioral disturbance (HCC)  Left shoulder fracture, dislocation:  - Orthopedics consulted, planning OR 9/29 with Dr. Tamera Punt. Pt is NPO, though would recommend cardiac evaluation prior to proceeding (consulted)  Apical hypertrophic cardiomyopathy, frequent PVCs:  - Continue metoprolol succinate.  - Cardiology is consulted for preoperative evaluation.   Interstitial pulmonary edema, small left pleural effusion suggestive of acute HFrEF: Last echo LVEF appeared mildly decreased.   - Since NPO, holding diuretic for now. No pulmonary symptoms or hypoxia at this time. Will defer to cardiology. Add-on BNP.  HTN: BP elevated, likely pain contributing. - Continue metoprolol - Heart healthy diet once no longer NPO  Hyperkalemia: Check BMP and if remains, give lokelma and other mitigating medications.  HLD:  - Continue statin once taking po.   History of CVA:  - Continue ASA, statin once taking po.  Dementia with history of acute hospital  delirium:  - Delirium precautions.  - Would avoid haldol as this may be implicated in tremors previously.   History of thyroidectomy: Must not have been total as she's not on supplement and TSH is wnl.  DVT prophylaxis: SCDs Code Status: DNR Family Communication: None at bedside Disposition Plan:  Status is: Inpatient  Remains inpatient appropriate because:Ongoing diagnostic testing needed not appropriate for outpatient work up and Inpatient level of care appropriate due to severity of illness  Dispo: The patient is from: Home              Anticipated d/c is to:  TBD              Patient currently is not medically stable to d/c.  Consultants:  Orthopedics Cardiology  Procedures:  TBD  Antimicrobials: None   Subjective: Pt reports soreness in the left shoulder but not really pain. No numbness or tingling, no dyspnea or chest pain currently or recently.   Objective: Vitals:   11/07/20 2005 11/07/20 2124 11/08/20 0050 11/08/20 0502  BP: (!) 188/104 (!) 193/85 (!) 165/82 (!) 167/79  Pulse: 71 75 91 81  Resp: 11 17 17 17   Temp:  97.7 F (36.5 C) (!) 97.5 F (36.4 C) 98.2 F (36.8 C)  TempSrc:  Oral Oral Oral  SpO2: 94% 96% 93% 95%  Weight:  47 kg    Height:  5\' 5"  (1.651 m)     No intake or output data in the 24 hours ending 11/08/20 0844 Filed Weights   11/07/20 2124  Weight: 47 kg    Gen: Elderly female in no distress Pulm: Non-labored breathing room air. Clear to auscultation bilaterally.  CV: Regular rate and rhythm.  No murmur, rub, or gallop. No JVD, no pitting pedal edema. GI: Abdomen soft, non-tender, non-distended, with normoactive bowel sounds. No organomegaly or masses felt. Ext: Warm, Left arm in sling with TTP over entire right shoulder area. No skin tenting. Distally NVI. Skin: Left periorbital laceration. Neuro: Alert and oriented. No focal neurological deficits. Psych: Judgement and insight appear normal. Mood & affect appropriate.   Data  Reviewed: I have personally reviewed following labs and imaging studies  CBC: Recent Labs  Lab 11/07/20 1931 11/08/20 0716  WBC 10.7* 11.4*  HGB 12.6 12.8  HCT 38.5 38.9  MCV 93.2 93.1  PLT 244 572   Basic Metabolic Panel: Recent Labs  Lab 11/07/20 1931 11/08/20 0716  NA 139 139  K 5.5* 3.8  CL 105 108  CO2 27 26  GLUCOSE 105* 116*  BUN 18 15  CREATININE 0.74 0.49  CALCIUM 8.1* 8.8*   GFR: Estimated Creatinine Clearance: 36.8 mL/min (by C-G formula based on SCr of 0.49 mg/dL). Liver Function Tests: No results for input(s): AST, ALT, ALKPHOS, BILITOT, PROT, ALBUMIN in the last 168 hours. No results for input(s): LIPASE, AMYLASE in the last 168 hours. No results for input(s): AMMONIA in the last 168 hours. Coagulation Profile: No results for input(s): INR, PROTIME in the last 168 hours. Cardiac Enzymes: No results for input(s): CKTOTAL, CKMB, CKMBINDEX, TROPONINI in the last 168 hours. BNP (last 3 results) No results for input(s): PROBNP in the last 8760 hours. HbA1C: No results for input(s): HGBA1C in the last 72 hours. CBG: No results for input(s): GLUCAP in the last 168 hours. Lipid Profile: No results for input(s): CHOL, HDL, LDLCALC, TRIG, CHOLHDL, LDLDIRECT in the last 72 hours. Thyroid Function Tests: No results for input(s): TSH, T4TOTAL, FREET4, T3FREE, THYROIDAB in the last 72 hours. Anemia Panel: No results for input(s): VITAMINB12, FOLATE, FERRITIN, TIBC, IRON, RETICCTPCT in the last 72 hours. Urine analysis:    Component Value Date/Time   COLORURINE YELLOW 10/10/2020 1645   APPEARANCEUR HAZY (A) 10/10/2020 1645   LABSPEC 1.010 10/10/2020 1645   PHURINE 5.0 10/10/2020 1645   GLUCOSEU NEGATIVE 10/10/2020 1645   GLUCOSEU NEGATIVE 08/21/2014 1027   HGBUR MODERATE (A) 10/10/2020 1645   HGBUR negative 09/25/2009 1034   BILIRUBINUR NEGATIVE 10/10/2020 1645   BILIRUBINUR Neg 05/26/2013 0827   KETONESUR NEGATIVE 10/10/2020 1645   PROTEINUR 100 (A)  10/10/2020 1645   UROBILINOGEN 0.2 08/21/2014 1027   NITRITE POSITIVE (A) 10/10/2020 1645   LEUKOCYTESUR NEGATIVE 10/10/2020 1645   Recent Results (from the past 240 hour(s))  Resp Panel by RT-PCR (Flu A&B, Covid) Nasopharyngeal Swab     Status: None   Collection Time: 11/07/20  7:31 PM   Specimen: Nasopharyngeal Swab; Nasopharyngeal(NP) swabs in vial transport medium  Result Value Ref Range Status   SARS Coronavirus 2 by RT PCR NEGATIVE NEGATIVE Final    Comment: (NOTE) SARS-CoV-2 target nucleic acids are NOT DETECTED.  The SARS-CoV-2 RNA is generally detectable in upper respiratory specimens during the acute phase of infection. The lowest concentration of SARS-CoV-2 viral copies this assay can detect is 138 copies/mL. A negative result does not preclude SARS-Cov-2 infection and should not be used as the sole basis for treatment or other patient management decisions. A negative result may occur with  improper specimen collection/handling, submission of specimen other than nasopharyngeal swab, presence of viral mutation(s) within the areas targeted by this assay, and inadequate number of viral copies(<138 copies/mL). A negative result must be combined with clinical observations,  patient history, and epidemiological information. The expected result is Negative.  Fact Sheet for Patients:  EntrepreneurPulse.com.au  Fact Sheet for Healthcare Providers:  IncredibleEmployment.be  This test is no t yet approved or cleared by the [place] FDA and  has been authorized for detection and/or diagnosis of SARS-CoV-2 by FDA under an Emergency Use Authorization (EUA). This EUA will remain  in effect (meaning this test can be used) for the duration of the COVID-19 declaration under Section 564(b)(1) of the Act, 21 U.S.C.section 360bbb-3(b)(1), unless the authorization is terminated  or revoked sooner.       Influenza A by PCR NEGATIVE NEGATIVE Final    Influenza B by PCR NEGATIVE NEGATIVE Final    Comment: (NOTE) The Xpert Xpress SARS-CoV-2/FLU/RSV plus assay is intended as an aid in the diagnosis of influenza from Nasopharyngeal swab specimens and should not be used as a sole basis for treatment. Nasal washings and aspirates are unacceptable for Xpert Xpress SARS-CoV-2/FLU/RSV testing.  Fact Sheet for Patients: EntrepreneurPulse.com.au  Fact Sheet for Healthcare Providers: IncredibleEmployment.be  This test is not yet approved or cleared by the [place] FDA and has been authorized for detection and/or diagnosis of SARS-CoV-2 by FDA under an Emergency Use Authorization (EUA). This EUA will remain in effect (meaning this test can be used) for the duration of the COVID-19 declaration under Section 564(b)(1) of the Act, 21 U.S.C. section 360bbb-3(b)(1), unless the authorization is terminated or revoked.  Performed at [facility], [place] [address] [place], [place] [address]   Surgical PCR screen     Status: None   Collection Time: 11/08/20  1:44 AM   Specimen: Nasal Mucosa; Nasal Swab  Result Value Ref Range Status   MRSA, PCR NEGATIVE NEGATIVE Final   Staphylococcus aureus NEGATIVE NEGATIVE Final    Comment: (NOTE) The Xpert SA Assay (FDA approved for NASAL specimens in patients 87 years of age and older), is one component of a comprehensive surveillance program. It is not intended to diagnose infection nor to guide or monitor treatment. Performed at [facility], [place] [address] [place], [place] [address]       Radiology Studies: CT Head Wo Contrast  Result Date: 11/07/2020 CLINICAL DATA:  [place], left shoulder injury EXAM: CT HEAD WITHOUT CONTRAST TECHNIQUE: Contiguous axial images were obtained from the base of the skull through the vertex without intravenous contrast. COMPARISON:  10/10/2020 FINDINGS: Brain: Stable confluent hypodensities  throughout the periventricular white matter consistent with chronic small-vessel ischemic changes. Chronic lacunar infarcts are again seen within the left basal ganglia. No signs of acute infarct or hemorrhage. Lateral ventricles and remaining midline structures are unremarkable. No acute extra-axial fluid collections. No mass effect. Vascular: Stable atherosclerosis.  No hyperdense vessel. Skull: Normal. Negative for fracture or focal lesion. Sinuses/Orbits: No acute finding. Other: None. IMPRESSION: 1. Stable chronic ischemic changes.  No acute intracranial process. Electronically Signed   By: Randa Ngo M.D.   On: 11/07/2020 17:31   CT Cervical Spine Wo Contrast  Result Date: 11/07/2020 CLINICAL DATA:  [place], left shoulder injury EXAM: CT CERVICAL SPINE WITHOUT CONTRAST TECHNIQUE: Multidetector CT imaging of the cervical spine was performed without intravenous contrast. Multiplanar CT image reconstructions were also generated. COMPARISON:  None. FINDINGS: Alignment: There is mild retrolisthesis of C4 on C5 and C5 on C6 likely due to spondylosis and facet hypertrophy. Otherwise alignment is anatomic. Skull base and vertebrae: No acute fracture. No primary bone lesion or focal pathologic process. Soft tissues and  spinal canal: No prevertebral fluid or swelling. No visible canal hematoma. Disc levels: There is diffuse spondylosis from C3 through C7. Multilevel facet hypertrophy greatest at C2-3. Marked hypertrophic changes at the C1-2 interface. Upper chest: Airway is patent. Dependent atelectasis right upper lobe. Other: Reconstructed images demonstrate no additional findings. IMPRESSION: 1. No acute cervical spine fracture. 2. Multilevel degenerative changes. Electronically Signed   By: Randa Ngo M.D.   On: 11/07/2020 17:35   CT Shoulder Left Wo Contrast  Result Date: 11/07/2020 CLINICAL DATA:  Shoulder trauma, scapular fracture EXAM: CT OF THE UPPER LEFT EXTREMITY WITHOUT CONTRAST TECHNIQUE:  Multidetector CT imaging of the upper left extremity was performed according to the standard protocol. COMPARISON:  None. FINDINGS: Bones/Joint/Cartilage Comminuted fracture of the left greater tuberosity with 2 cm of lateral displacement. Anterior shoulder dislocation with the humeral head perched on the anterior glenoid neck. No other acute fracture or dislocation. No aggressive osseous lesion. Mild arthropathy of the acromioclavicular joint. Ligaments Ligaments are suboptimally evaluated by CT. Muscles and Tendons Muscles are normal.  No muscle atrophy. Soft tissue No fluid collection or hematoma. No soft tissue mass. Thoracic aortic atherosclerosis. Carotid artery atherosclerosis. IMPRESSION: 1. Comminuted fracture of the left greater tuberosity with 2 cm of lateral displacement. Anterior shoulder dislocation with the humeral head perched on the anterior glenoid neck. Electronically Signed   By: Kathreen Devoid M.D.   On: 11/07/2020 19:37   DG Chest Portable 1 View  Result Date: 11/07/2020 CLINICAL DATA:  Status post fall, left shoulder pain EXAM: PORTABLE CHEST 1 VIEW COMPARISON:  10/18/2020 FINDINGS: Mild bilateral interstitial thickening. Small left pleural effusion. Mild left basilar airspace disease concerning for atelectasis. Stable cardiomediastinal silhouette. No acute osseous abnormality. IMPRESSION: 1. Bilateral interstitial thickening concerning for mild interstitial edema with a small left pleural effusion. Electronically Signed   By: Kathreen Devoid M.D.   On: 11/07/2020 19:33   DG Shoulder Left  Result Date: 11/07/2020 CLINICAL DATA:  Fall EXAM: LEFT SHOULDER - 2+ VIEW COMPARISON:  None. FINDINGS: Anterior inferior dislocation of left humeral head with respect to the glenoid fossa. Comminuted displaced fracture involving the superolateral humeral head with probable fracture at the inferior glenoid. IMPRESSION: Anterior dislocation of the left humeral head with associated comminuted fracture of  the superolateral humeral head and suspected fracture of the inferior glenoid Electronically Signed   By: Donavan Foil M.D.   On: 11/07/2020 17:05    Scheduled Meds:  feeding supplement  237 mL Oral BID BM   metoprolol succinate  12.5 mg Oral Daily   mupirocin ointment  1 application Nasal BID   Continuous Infusions:   LOS: 1 day   Time spent: 25 minutes.  Patrecia Pour, MD [facility] [email or web address] 11/08/2020, 8:44 AM

## 2020-11-08 NOTE — Telephone Encounter (Signed)
PCP is aware  

## 2020-11-08 NOTE — Consult Note (Signed)
Reason for Consult: Left shoulder fracture dislocation Referring Physician: EDP  LERIN JECH is an 87 y.o. female.  HPI: 87 year old female status post fall yesterday with left proximal humeral fracture dislocation which was not reducible in the emergency department.  I was consulted for evaluation and management.  Past Medical History:  Diagnosis Date   AAA (abdominal aortic aneurysm) [facility])    sees Dr Eden Lathe    B12 deficiency    Cancer of right breast [facility]) [place] 11/28/2008   sees Dr Oneida Alar    COPD (chronic obstructive pulmonary disease) ([place]) 01/25/2014   CVA (cerebrovascular accident) ([place]) 2006   Diabetes mellitus ([place]) 05/29/2010   A1c 6.0 on October 2011    Hyperlipidemia    Hypertension    Osteoporosis 2003   Pneumonia ~ 2014; ~ 2015   Prolapse of female bladder, acquired    Shingles    repeated episone 9/11 went to a UC   TIA (transient ischemic attack) ~ 2005   Transient hypothyroidism    d/t thyroidectomy   UTI (lower urinary tract infection) May 2013    Past Surgical History:  Procedure Laterality Date   ABDOMINAL HYSTERECTOMY     no oophorectomy   CATARACT EXTRACTION W/ INTRAOCULAR LENS  IMPLANT, BILATERAL Bilateral    DILATION AND CURETTAGE OF UTERUS     MASTECTOMY, RADICAL Right 1960s   remotely    PERIPHERAL VASCULAR CATHETERIZATION  12/03/2015   PERIPHERAL VASCULAR CATHETERIZATION N/A 12/03/2015   Procedure: Abdominal Aortogram;  Surgeon: Lorretta Harp, MD;  Location: [place] [facility];  Service: Cardiovascular;  Laterality: N/A;   PERIPHERAL VASCULAR CATHETERIZATION Bilateral 12/03/2015   Procedure: Lower Extremity Angiography;  Surgeon: Lorretta Harp, MD;  Location: [place] [facility];  Service: Cardiovascular;  Laterality: Bilateral;   PERIPHERAL VASCULAR CATHETERIZATION Bilateral 12/03/2015   Procedure: Peripheral Vascular Intervention;  Surgeon: Lorretta Harp, MD;  Location: [place] [facility];  Service:  Cardiovascular;  Laterality: Bilateral;  30mmx58mm Lif[place]ream bilateral Common Iliac Arteries   PERIPHERAL VASCULAR CATHETERIZATION Bilateral 12/03/2015   Procedure: Peripheral Vascular Atherectomy;  Surgeon: Lorretta Harp, MD;  Location: [place] [facility];  Service: Cardiovascular;  Laterality: Bilateral;  Common Iliac Arteries   THYROIDECTOMY      Family History  Problem Relation Age of Onset   Cancer Other        sister (lung) , brother (mouth)   Colon cancer Neg Hx    Coronary artery disease Neg Hx    Diabetes type II Neg Hx    Breast cancer Neg Hx     Social History:  reports that she has been smoking cigarettes. She has a 33.50 pack-year smoking history. She has never used smokeless tobacco. She reports that she does not drink alcohol and does not use drugs.  Allergies:  Allergies  Allergen Reactions   Septra [Sulfamethoxazole-Trimethoprim] Itching    Pt states she had a severe allergic reaction; she was in the ICU for 2 days because of taking this.   Ciprofloxacin Hcl Itching   Codeine Rash    Made her feel very strange    Medications: I have reviewed the patient's current medications.  Results for orders placed or performed during the hospital encounter of 11/07/20 (from the past 48 hour(s))  Basic metabolic panel     Status: Abnormal   Collection Time: 11/07/20  7:31 PM  Result Value Ref Range   Sodium 139 135 - 145 mmol/L   Potassium  5.5 (H) 3.5 - 5.1 mmol/L   Chloride 105 98 - 111 mmol/L   CO2 27 22 - 32 mmol/L   Glucose, Bld 105 (H) 70 - 99 mg/dL    Comment: Glucose reference range applies only to samples taken after fasting for at least 8 hours.   BUN 18 8 - 23 mg/dL   Creatinine, Ser 0.74 0.44 - 1.00 mg/dL   Calcium 8.1 (L) 8.9 - 10.3 mg/dL   GFR, Estimated >60 >60 mL/min    Comment: (NOTE) Calculated using the CKD-EPI Creatinine Equation (2021)    Anion gap 7 5 - 15    Comment: Performed at [facility], [place] [address],  [place], [place] [address]  CBC     Status: Abnormal   Collection Time: 11/07/20  7:31 PM  Result Value Ref Range   WBC 10.7 (H) 4.0 - 10.5 K/uL   RBC 4.13 3.87 - 5.11 MIL/uL   Hemoglobin 12.6 12.0 - 15.0 g/dL   HCT 38.5 36.0 - 46.0 %   MCV 93.2 80.0 - 100.0 fL   MCH 30.5 26.0 - 34.0 pg   MCHC 32.7 30.0 - 36.0 g/dL   RDW 14.3 11.5 - 15.5 %   Platelets 244 150 - 400 K/uL   nRBC 0.0 0.0 - 0.2 %    Comment: Performed at [facility], [place] [address], [place], [place] [address]  Resp Panel by RT-PCR (Flu A&B, Covid) Nasopharyngeal Swab     Status: None   Collection Time: 11/07/20  7:31 PM   Specimen: Nasopharyngeal Swab; Nasopharyngeal(NP) swabs in vial transport medium  Result Value Ref Range   SARS Coronavirus 2 by RT PCR NEGATIVE NEGATIVE    Comment: (NOTE) SARS-CoV-2 target nucleic acids are NOT DETECTED.  The SARS-CoV-2 RNA is generally detectable in upper respiratory specimens during the acute phase of infection. The lowest concentration of SARS-CoV-2 viral copies this assay can detect is 138 copies/mL. A negative result does not preclude SARS-Cov-2 infection and should not be used as the sole basis for treatment or other patient management decisions. A negative result may occur with  improper specimen collection/handling, submission of specimen other than nasopharyngeal swab, presence of viral mutation(s) within the areas targeted by this assay, and inadequate number of viral copies(<138 copies/mL). A negative result must be combined with clinical observations, patient history, and epidemiological information. The expected result is Negative.  Fact Sheet for Patients:  EntrepreneurPulse.com.au  Fact Sheet for Healthcare Providers:  IncredibleEmployment.be  This test is no t yet approved or cleared by the [place] FDA and  has been authorized for detection and/or diagnosis of SARS-CoV-2 by FDA under an Emergency Use  Authorization (EUA). This EUA will remain  in effect (meaning this test can be used) for the duration of the COVID-19 declaration under Section 564(b)(1) of the Act, 21 U.S.C.section 360bbb-3(b)(1), unless the authorization is terminated  or revoked sooner.       Influenza A by PCR NEGATIVE NEGATIVE   Influenza B by PCR NEGATIVE NEGATIVE    Comment: (NOTE) The Xpert Xpress SARS-CoV-2/FLU/RSV plus assay is intended as an aid in the diagnosis of influenza from Nasopharyngeal swab specimens and should not be used as a sole basis for treatment. Nasal washings and aspirates are unacceptable for Xpert Xpress SARS-CoV-2/FLU/RSV testing.  Fact Sheet for Patients: EntrepreneurPulse.com.au  Fact Sheet for Healthcare Providers: IncredibleEmployment.be  This test is not yet approved or cleared by the [place] and has been authorized for  detection and/or diagnosis of SARS-CoV-2 by FDA under an Emergency Use Authorization (EUA). This EUA will remain in effect (meaning this test can be used) for the duration of the COVID-19 declaration under Section 564(b)(1) of the Act, 21 U.S.C. section 360bbb-3(b)(1), unless the authorization is terminated or revoked.  Performed at [facility], [place] [address] [place], [place] [address]   Type and screen [place]     Status: None   Collection Time: 11/07/20  8:20 PM  Result Value Ref Range   ABO/RH(D) AB POS    Antibody Screen NEG    Sample Expiration      11/10/2020,2359 Performed at [facility], [place] [address] [place], [place] [address]   Surgical PCR screen     Status: None   Collection Time: 11/08/20  1:44 AM   Specimen: Nasal Mucosa; Nasal Swab  Result Value Ref Range   MRSA, PCR NEGATIVE NEGATIVE   Staphylococcus aureus NEGATIVE NEGATIVE    Comment: (NOTE) The Xpert SA Assay (FDA approved for NASAL specimens in patients 85 years of  age and older), is one component of a comprehensive surveillance program. It is not intended to diagnose infection nor to guide or monitor treatment. Performed at [facility], [place] [address], [place], [place] [address]   ABO/Rh     Status: None   Collection Time: 11/08/20  7:15 AM  Result Value Ref Range   ABO/RH(D)      AB POS Performed at [facility], [place] [address], [place], [place] [address]   Basic metabolic panel     Status: Abnormal   Collection Time: 11/08/20  7:16 AM  Result Value Ref Range   Sodium 139 135 - 145 mmol/L   Potassium 3.8 3.5 - 5.1 mmol/L    Comment: DELTA CHECK NOTED   Chloride 108 98 - 111 mmol/L   CO2 26 22 - 32 mmol/L   Glucose, Bld 116 (H) 70 - 99 mg/dL    Comment: Glucose reference range applies only to samples taken after fasting for at least 8 hours.   BUN 15 8 - 23 mg/dL   Creatinine, Ser 0.49 0.44 - 1.00 mg/dL   Calcium 8.8 (L) 8.9 - 10.3 mg/dL   GFR, Estimated >60 >60 mL/min    Comment: (NOTE) Calculated using the CKD-EPI Creatinine Equation (2021)    Anion gap 5 5 - 15    Comment: Performed at [facility], [place] [address], [place], [place] [address]  CBC     Status: Abnormal   Collection Time: 11/08/20  7:16 AM  Result Value Ref Range   WBC 11.4 (H) 4.0 - 10.5 K/uL   RBC 4.18 3.87 - 5.11 MIL/uL   Hemoglobin 12.8 12.0 - 15.0 g/dL   HCT 38.9 36.0 - 46.0 %   MCV 93.1 80.0 - 100.0 fL   MCH 30.6 26.0 - 34.0 pg   MCHC 32.9 30.0 - 36.0 g/dL   RDW 14.4 11.5 - 15.5 %   Platelets 260 150 - 400 K/uL   nRBC 0.0 0.0 - 0.2 %    Comment: Performed at [facility], [place] [address] [place], [place] [address]  Brain natriuretic peptide     Status: Abnormal   Collection Time: 11/08/20 10:18 AM  Result Value Ref Range   B Natriuretic Peptide 2,830.3 (H) 0.0 - 100.0 pg/mL    Comment: Performed at [facility], [place] [address]  [place], [place] [address]     CT Head Wo Contrast  Result Date: 11/07/2020 CLINICAL DATA:  [place], left shoulder injury EXAM: CT HEAD WITHOUT CONTRAST TECHNIQUE: Contiguous axial images were obtained from the base of the skull through the vertex without intravenous contrast. COMPARISON:  10/10/2020 FINDINGS: Brain: Stable confluent hypodensities throughout the periventricular white matter consistent with chronic small-vessel ischemic changes. Chronic lacunar infarcts are again seen within the left basal ganglia. No signs of acute infarct or hemorrhage. Lateral ventricles and remaining midline structures are unremarkable. No acute extra-axial fluid collections. No mass effect. Vascular: Stable atherosclerosis.  No hyperdense vessel. Skull: Normal. Negative for fracture or focal lesion. Sinuses/Orbits: No acute finding. Other: None. IMPRESSION: 1. Stable chronic ischemic changes.  No acute intracranial process. Electronically Signed   By: Randa Ngo M.D.   On: 11/07/2020 17:31   CT Cervical Spine Wo Contrast  Result Date: 11/07/2020 CLINICAL DATA:  [place], left shoulder injury EXAM: CT CERVICAL SPINE WITHOUT CONTRAST TECHNIQUE: Multidetector CT imaging of the cervical spine was performed without intravenous contrast. Multiplanar CT image reconstructions were also generated. COMPARISON:  None. FINDINGS: Alignment: There is mild retrolisthesis of C4 on C5 and C5 on C6 likely due to spondylosis and facet hypertrophy. Otherwise alignment is anatomic. Skull base and vertebrae: No acute fracture. No primary bone lesion or focal pathologic process. Soft tissues and spinal canal: No prevertebral fluid or swelling. No visible canal hematoma. Disc levels: There is diffuse spondylosis from C3 through C7. Multilevel facet hypertrophy greatest at C2-3. Marked hypertrophic changes at the C1-2 interface. Upper chest: Airway is patent. Dependent atelectasis right upper lobe. Other: Reconstructed images demonstrate no additional findings. IMPRESSION:  1. No acute cervical spine fracture. 2. Multilevel degenerative changes. Electronically Signed   By: Randa Ngo M.D.   On: 11/07/2020 17:35   CT Shoulder Left Wo Contrast  Result Date: 11/07/2020 CLINICAL DATA:  Shoulder trauma, scapular fracture EXAM: CT OF THE UPPER LEFT EXTREMITY WITHOUT CONTRAST TECHNIQUE: Multidetector CT imaging of the upper left extremity was performed according to the standard protocol. COMPARISON:  None. FINDINGS: Bones/Joint/Cartilage Comminuted fracture of the left greater tuberosity with 2 cm of lateral displacement. Anterior shoulder dislocation with the humeral head perched on the anterior glenoid neck. No other acute fracture or dislocation. No aggressive osseous lesion. Mild arthropathy of the acromioclavicular joint. Ligaments Ligaments are suboptimally evaluated by CT. Muscles and Tendons Muscles are normal.  No muscle atrophy. Soft tissue No fluid collection or hematoma. No soft tissue mass. Thoracic aortic atherosclerosis. Carotid artery atherosclerosis. IMPRESSION: 1. Comminuted fracture of the left greater tuberosity with 2 cm of lateral displacement. Anterior shoulder dislocation with the humeral head perched on the anterior glenoid neck. Electronically Signed   By: Kathreen Devoid M.D.   On: 11/07/2020 19:37   DG Chest Portable 1 View  Result Date: 11/07/2020 CLINICAL DATA:  Status post fall, left shoulder pain EXAM: PORTABLE CHEST 1 VIEW COMPARISON:  10/18/2020 FINDINGS: Mild bilateral interstitial thickening. Small left pleural effusion. Mild left basilar airspace disease concerning for atelectasis. Stable cardiomediastinal silhouette. No acute osseous abnormality. IMPRESSION: 1. Bilateral interstitial thickening concerning for mild interstitial edema with a small left pleural effusion. Electronically Signed   By: Kathreen Devoid M.D.   On: 11/07/2020 19:33   DG Shoulder Left  Result Date: 11/07/2020 CLINICAL DATA:  Fall EXAM: LEFT SHOULDER - 2+ VIEW COMPARISON:   None. FINDINGS: Anterior inferior dislocation of left humeral head with respect to the glenoid fossa. Comminuted displaced fracture involving  the superolateral humeral head with probable fracture at the inferior glenoid. IMPRESSION: Anterior dislocation of the left humeral head with associated comminuted fracture of the superolateral humeral head and suspected fracture of the inferior glenoid Electronically Signed   By: Donavan Foil M.D.   On: 11/07/2020 17:05    Review of Systems  Unable to perform ROS: Dementia  Blood pressure (!) 179/81, pulse 82, temperature 98.2 F (36.8 C), temperature source Oral, resp. rate 16, height 5\' 5"  (1.651 m), weight 47 kg, SpO2 94 %. Physical Exam HENT:     Head: Atraumatic.  Eyes:     Extraocular Movements: Extraocular movements intact.  Cardiovascular:     Pulses: Normal pulses.  Pulmonary:     Effort: Pulmonary effort is normal.  Musculoskeletal:     Comments: Left shoulder with anterior muscular indentation.  Pain with any shoulder range of motion.  Distally neurovascularly intact.  Neurological:     Mental Status: She is alert.    Assessment/Plan: Left shoulder fracture dislocation Patient will be taken to the operating room this afternoon where with regional anesthesia we will attempt to close reduce the dislocation.  If this is not reducible in a closed fashion she may require open reduction. Risks / benefits of surgery discussed Consent on chart  NPO for OR  Rhae Hammock 11/08/2020, 12:06 PM

## 2020-11-08 NOTE — TOC Initial Note (Signed)
Transition of Care [facility]) - Initial/Assessment Note    Patient Details  Name: Crystal Osborne MRN: [id number] Date of Birth: 11-25-1931  [facility] CM/SW Contact:    Crystal Phi, RN Phone Number: 11/08/2020, 12:32 PM  Clinical Narrative:  From home-left vm w/Crystal Osborne-to discuss d/c plans-L shoulder ORIF today. Await PT eval & recc.                 Expected Discharge Plan: Skilled Nursing Facility Barriers to Discharge: Continued Medical Work up   Patient Goals and CMS Choice   CMS Medicare.gov Compare Post Acute Care list provided to:: Patient Represenative (must comment) (Crystal Osborne [phone]-left vm) Choice offered to / list presented to : Adult Children  Expected Discharge Plan and Services Expected Discharge Plan: [place]   Discharge Planning Services: CM Consult   Living arrangements for the past 2 months: Single Family Home                                      Prior Living Arrangements/Services Living arrangements for the past 2 months: Single Family Home Lives with:: Self Patient language and need for interpreter reviewed:: Yes Do you feel safe going back to the place where you live?: Yes      Need for Family Participation in Patient Care: No (Comment) Care giver support system in place?: Yes (comment)   Criminal Activity/Legal Involvement Pertinent to Current Situation/Hospitalization: No - Comment as needed  Activities of Daily Living Home Assistive Devices/Equipment: Dentures (specify type) (dentures - pt unsure what kind (Pt just had pain medication)) ADL Screening (condition at time of admission) Patient's cognitive ability adequate to safely complete daily activities?: Yes Is the patient deaf or have difficulty hearing?: Yes Does the patient have difficulty seeing, even when wearing glasses/contacts?: No Does the patient have difficulty concentrating, remembering, or making decisions?: Yes Patient able to express  need for assistance with ADLs?: Yes Does the patient have difficulty dressing or bathing?: Yes Independently performs ADLs?: No Communication: Independent Dressing (OT): Needs assistance Is this a change from baseline?: Pre-admission baseline Grooming: Needs assistance Is this a change from baseline?: Pre-admission baseline Feeding: Independent Bathing: Needs assistance Is this a change from baseline?: Pre-admission baseline Toileting: Needs assistance Is this a change from baseline?: Pre-admission baseline In/Out Bed: Needs assistance Is this a change from baseline?: Pre-admission baseline Walks in Home: Needs assistance Is this a change from baseline?: Pre-admission baseline Does the patient have difficulty walking or climbing stairs?: Yes Weakness of Legs: None Weakness of Arms/Hands: Left  Permission Sought/Granted Permission sought to share information with : Case Manager Permission granted to share information with : Yes, Verbal Permission Granted  Share Information with NAME: Case Manager     Permission granted to share info w Relationship: [phone]     Emotional Assessment Appearance:: Appears stated age Attitude/Demeanor/Rapport: Gracious Affect (typically observed): Accepting Orientation: : Oriented to Self, Oriented to Place Alcohol / Substance Use: Not Applicable Psych Involvement: No (comment)  Admission diagnosis:  Fall, initial encounter [W19.XXXA] Fracture dislocation of left shoulder joint, closed, initial encounter [S42.92XA] Closed fracture dislocation of joint of left shoulder girdle, initial encounter [S42.92XA] Patient Active Problem List   Diagnosis Date Noted   Dysphagia 11/07/2020   Fracture dislocation of left shoulder joint, closed, initial encounter 11/07/2020   Dementia without behavioral disturbance ([place]) 11/07/2020   CAP (community  acquired pneumonia) 10/14/147   Acute metabolic encephalopathy 96/92/4932   Hypertrophic  cardiomyopathy ([place]) 10/10/2020   Shingles 07/29/2017   Syncope 07/25/2016   Claudication ([place]) 12/03/2015   PCP NOTES >>>>> 01/10/2015   COPD (chronic obstructive pulmonary disease) ([place]) 01/25/2014   B12 deficiency 01/25/2014   Anxiety and depression 07/26/2012   Insomnia 08/12/2011   Annual physical exam 05/29/2010   Hyperglycemia 05/29/2010   Diarrhea, chronic , on-off 05/29/2010   TOBACCO ABUSE 11/15/2009   Disorder of bladder 09/25/2009   SHOULDER PAIN 04/10/2009   PERSONAL HX BREAST CANCER 12/12/2008   Carotid artery disease ([place]) 11/28/2008   PVD (peripheral vascular disease) ([place]) 04/18/2008   BLIND LOOP SYNDROME 11/09/2007   DIVERTICULOSIS, COLON 10/14/2007   COLONIC POLYPS 08/03/2006   Essential hypertension 08/03/2006   CVA 08/03/2006   OSTEOPOROSIS NOS 08/03/2006   History of thyroidectomy 08/03/2006   Hyperlipidemia 03/05/2006   Abdominal aortic aneurysm ([place]) 03/05/2006   PCP:  Colon Branch, MD Pharmacy:   [facility] [phone] [address], [address] AT [facility] OF [address] [place] [address] Phone: [phone] Fax: [phone]     Social Determinants of Health ([place]) Interventions    Readmission Risk Interventions No flowsheet data found.

## 2020-11-08 NOTE — Plan of Care (Signed)
  Problem: Education: Goal: Knowledge of General Education information will improve Description: Including pain rating scale, medication(s)/side effects and non-pharmacologic comfort measures Outcome: Progressing   Problem: Health Behavior/Discharge Planning: Goal: Ability to manage health-related needs will improve Outcome: Progressing   Problem: Clinical Measurements: Goal: Ability to maintain clinical measurements within normal limits will improve Outcome: Progressing Goal: Will remain free from infection Outcome: Progressing Goal: Diagnostic test results will improve Outcome: Progressing Goal: Respiratory complications will improve Outcome: Progressing   Problem: Activity: Goal: Risk for activity intolerance will decrease Outcome: Progressing   Problem: Nutrition: Goal: Adequate nutrition will be maintained Outcome: Progressing   Problem: Coping: Goal: Level of anxiety will decrease Outcome: Progressing   Problem: Elimination: Goal: Will not experience complications related to bowel motility Outcome: Progressing Goal: Will not experience complications related to urinary retention Outcome: Progressing   Problem: Pain Managment: Goal: General experience of comfort will improve Outcome: Progressing   Problem: Safety: Goal: Ability to remain free from injury will improve Outcome: Progressing   Problem: Skin Integrity: Goal: Risk for impaired skin integrity will decrease Outcome: Progressing   Problem: Education: Goal: Knowledge of the prescribed therapeutic regimen will improve Outcome: Progressing Goal: Understanding of activity limitations/precautions following surgery will improve Outcome: Progressing Goal: Individualized Educational Video(s) Outcome: Progressing   Problem: Activity: Goal: Ability to tolerate increased activity will improve Outcome: Progressing   Problem: Pain Management: Goal: Pain level will decrease with appropriate  interventions Outcome: Progressing

## 2020-11-08 NOTE — Plan of Care (Signed)
  Problem: Coping: Goal: Level of anxiety will decrease Outcome: Progressing   Problem: Pain Management: Goal: Pain level will decrease with appropriate interventions Outcome: Progressing

## 2020-11-08 NOTE — Progress Notes (Signed)
Initial Nutrition Assessment  DOCUMENTATION CODES:   Underweight  INTERVENTION:  - diet advancement as medically feasible post-op. - will order Ensure Surgery BID, each supplement provides 330 kcal and 18 grams of protein. - will order Boost Breeze once/day, each supplement provides 250 kcal and 9 grams of protein. - will order 1 tablet multivitamin with minerals/day. - complete NFPE when feasible.    NUTRITION DIAGNOSIS:   Increased nutrient needs related to post-op healing as evidenced by estimated needs.  GOAL:   Patient will meet greater than or equal to 90% of their needs  MONITOR:   Diet advancement, PO intake, Supplement acceptance, Labs, Weight trends, Skin  REASON FOR ASSESSMENT:   Malnutrition Screening Tool  ASSESSMENT:   87 y.o. female with medical history of AAA, R breast cancer, COPD, CVA, HTN, HLD, pre-diabetes, admission for PNA complicated by delirium earlier this month, subsequently discharged to SNF on 9/9 then home on 9/23. She presented to the ED after a fall at home with resultant L shoulder fx and dislocation which was not able to be reduced in the ED.  Patient is noted to be a/o to person and time.  She is out of the room to OR for fixation of L shoulder fracture. She has been NPO since admission.   She has not been seen by a [place] RD at any time in the past.   Yesterday she weighed 104 lb and weight on 8/2 was 113 lb. This indicates 9 lb weight loss (8% body weight) in the past ~2 months; significant for time frames.    Labs reviewed; Ca: 8.8 mg/dl.  Medications reviewed.  IVF; LR @ 50 ml/hr.    NUTRITION - FOCUSED PHYSICAL EXAM:  Unable to complete at this time.  Diet Order:   Diet Order             Diet NPO time specified Except for: Sips with Meds  Diet effective midnight                   EDUCATION NEEDS:   No education needs have been identified at this time  Skin:  Skin Assessment: Reviewed RN Assessment  Last  BM:  PTA/unknown  Height:   Ht Readings from Last 1 Encounters:  11/07/20 5\' 5"  (1.651 m)    Weight:   Wt Readings from Last 1 Encounters:  11/07/20 47 kg     Estimated Nutritional Needs:  Kcal:  1600-1800 kcal Protein:  85-100 grams Fluid:  >/= 1.8 L/day      Jarome Matin, MS, RD, LDN, CNSC Inpatient Clinical Dietitian RD pager # available in [place]  After hours/weekend pager # available in [facility]

## 2020-11-08 NOTE — Transfer of Care (Signed)
Immediate Anesthesia Transfer of Care Note  Patient: Crystal Osborne  Procedure(s) Performed: CLOSED MANIPULATION SHOULDER (Left: Shoulder)  Patient Location: PACU  Anesthesia Type:MAC  Level of Consciousness: awake, alert , oriented and patient cooperative  Airway & Oxygen Therapy: Patient Spontanous Breathing and Patient connected to face mask oxygen  Post-op Assessment: Report given to RN, Post -op Vital signs reviewed and stable and Patient moving all extremities  Post vital signs: Reviewed and stable  Last Vitals:  Vitals Value Taken Time  BP 152/62 11/08/20 1433  Temp    Pulse 64 11/08/20 1435  Resp 14 11/08/20 1435  SpO2 99 % 11/08/20 1435  Vitals shown include unvalidated device data.  Last Pain:  Vitals:   11/08/20 1143  TempSrc: Oral  PainSc:          Complications: No notable events documented.

## 2020-11-08 NOTE — Op Note (Signed)
Procedure(s): CLOSED MANIPULATION SHOULDER Procedure Note  Crystal Osborne female 87 y.o. 11/08/2020   Preoperative diagnosis: Left shoulder fracture dislocation  Postoperative diagnosis: Same  Procedure performed: Closed reduction left shoulder fracture dislocation under anesthesia  Surgeon(s) and Role:    Tania Ade, MD - Primary     Surgeon: Rhae Hammock   Assistants: None  Anesthesia: Interscalene regional block given by the attending anesthesiologist   Procedure Detail   Estimated Blood Loss: None         Drains: none  Blood Given: none         Specimens: none        Complications:  * No complications entered in OR log *         Disposition: PACU - hemodynamically stable.         Condition: stable    Procedure:  The patient was identified in the preoperative holding area where I personally marked the site after verifying site side and procedure with the patient.  She was taken back to the operating room where she was placed on the operative table.  She had a preoperative interscalene block given by the attending anesthesiologist.  Fluoroscopy was used to verify the fracture dislocation and using gentle traction forward flexion and rotation I was able to reduce the shoulder without significant difficulty.  Fluoroscopy demonstrated anatomic reduction with the greater tuberosity fragments in acceptable position.  Stability was tested and noted to be grossly stable.  The sling was reapplied.  The patient was then transferred back to the stretcher and taken to the recovery room in stable condition.

## 2020-11-08 NOTE — Consult Note (Addendum)
Cardiology Consultation:   Patient ID: Crystal Osborne MRN: [id number]; DOB: March 25, 1931  Admit date: 11/07/2020 Date of Consult: 11/08/2020  PCP:  Colon Branch, MD   [facility] Cardiologist:  Quay Burow, MD   {    Patient Profile:   Crystal Osborne is a 87 y.o. female with PMH of PAD, tobacco use, HTN, HLD, TIA, type 2 DM, COPD, carotid artery disease, hypertrophic cardiomyopathy, memory loss, who is being seen 11/08/2020 for the evaluation of pre-op risk evaluation at the request of Dr Alcario Drought.  History of Present Illness:   Crystal Osborne follows Dr Gwenlyn Found historically for PAD, underwent angiography 12/03/15 revealing high-grade calcified ostial bilateral iliac artery stenoses with three-vessel runoff. She was treated with diamondback orbital rotational atherectomy, PTA and covered stenting using Lifestream cover stents of both iliac ostia.  Follow-up Dopplers on 08/20/20 revealed  stable findings, her claudication symptoms had resolved.  Echocardiogram 09/15/2016 showed an LVEF of 60-65%, G1 DD, mild-moderate tricuspid regurgitation, mildly elevated pulmonary arterial systolic pressure.  She saw Dr Gardiner Rhyme 09/13/20 for dizziness, near syncope, and palpitations. EKG showed frequent PVCs. She was placed on Zio monitor for 3 days, which showed frequent PVCs.  Last Echo from 10/01/20 showed apical hypertrophic cardiomyopathy, LVEF appears mildly decreased (difficult to assess due to frequent ectopy/bigeminy), LV size mildly dilated, RV systolic function low normal, mildly elevated RVSP 43.4 mmHg, mild dilation of LA, mild MR, mild to moderate TR, mild to moderate aortic sclerosis. She followed up on 10/04/20 at the office, reporting taking  tramadol and a muscle relaxer for shoulder pain, continue feel dizzy. Her felodipine and lisinopril were stopped due to low normal BP, metoprolol succinate 12.5 mg was started for frequent PVCs/HTN.  She has refused cardiac MRI for further evaluation of  hypertrophic cardiomyopathy, does not wish any invasive procedures as well.  She was also advised to not take muscle relaxant given ongoing near-syncope/palpitation.   She had a recent hospitalization from 08/04/61-09/18/3732 for metabolic encephalopathy with acute delirium.  She has no known history of dementia documented.  Also reported 2 weeks onset of shoulder pain.  She was found to be hypoxic requiring 2LNC and NRB support. CXR was concerning for left-sided pneumonia.  She was treated with a course of antibiotic, eventually weaned off oxygen, encephalopathy was felt multifactorial due to infection plus polypharmacy plus hospitalization.  She was evaluated by SLP and dysphagia 3 diet with nectar thickened liquids was recommended.  She was discharged to SNF on 10/19/2020, released from SNF on 11/02/2020 to home.  She presented to the ER 11/07/2020 after a mechanical fall, she tripped over her feet and lost balance, fell onto her left arm.  She did hit her head and suffered a left temporal abrasion.  No LOC.  She developed acute pain of her left shoulder following the fall.    She has memory loss for 6 month to 1 year, no official diagnosis of dementia.  During encounter, she reports feeling very tired and did not sleep all night.  She states her son Crystal Osborne is looking for and helps her make medical decisions.  She is aware that she is currently hospitalized at [facility] long.  She states her left shoulder pain is overall improving today.  She does not recall recent cardiology appointment in August, does not recall any dizziness or heart palpitation, she denies any current chest pain, chest pressure, shortness of breath, dizziness, syncope.  She states she walks in the house with a walker, needs  help with her ADLs.  She is an overall limited historian.  She continued to not wish any further cardiac work-up such as MRI.  Called patient's son Crystal Osborne, who reports that since recent hospitalization for pneumonia, patient has  been overall deconditioned and weak, she is able to walk on the ground level with a walker briefly, she is not able to walk 1-2 blocks or climb stairs, she was able to do some laundry, she needs assistance with taking a shower/getting dressed/or any moderate activity since the hospitalization.  Son felt patient is overall improving from pneumonia standpoint, but remains very weak.  Son has consented for patient to go for left shoulder surgery today, understands the risk based on her age and comorbidity.   Admission diagnostic revealed mild hyperkalemia 5.5, mild leukocytosis 10700, flu and COVID-19 negative.  Left shoulder x-ray revealed anterior dislocation of left humeral head with associated comminuted fracture of superolateral humeral head and suspected fracture of inferior glenoid.  CT head no acute ICH.  CT cervical spine without fracture.  Chest x-ray revealed bilateral interstitial thickening concerning for mild interstitial edema with small left pleural effusion. CT shoulder W/O showed comminuted fracture of the left greater tuberosity with 2 cm of lateral displacement. Anterior shoulder dislocation with the humeral head perched on the anterior glenoid neck. EKG showed sinus rhythm with ventricular rate of 81, LVH, T wave flattening of 1 and aVL, LAE, no significant change.  She is admitted to hospital medicine service, orthopedic consultation was requested, plan for OR 11/08/2020.  Cardiology is consulted today for preop evaluation.     Past Medical History:  Diagnosis Date   AAA (abdominal aortic aneurysm) [facility])    sees Dr Eden Lathe    B12 deficiency    Cancer of right breast [facility]) [place] 11/28/2008   sees Dr [place] Alar    COPD (chronic obstructive pulmonary disease) ([place]) 01/25/2014   CVA (cerebrovascular accident) ([place]) 2006   Diabetes mellitus ([place]) 05/29/2010   A1c 6.0 on October 2011    Hyperlipidemia    Hypertension    Osteoporosis 2003   Pneumonia ~ 2014; ~ 2015    Prolapse of female bladder, acquired    Shingles    repeated episone 9/11 went to a UC   TIA (transient ischemic attack) ~ 2005   Transient hypothyroidism    d/t thyroidectomy   UTI (lower urinary tract infection) May 2013    Past Surgical History:  Procedure Laterality Date   ABDOMINAL HYSTERECTOMY     no oophorectomy   CATARACT EXTRACTION W/ INTRAOCULAR LENS  IMPLANT, BILATERAL Bilateral    DILATION AND CURETTAGE OF UTERUS     MASTECTOMY, RADICAL Right 1960s   remotely    PERIPHERAL VASCULAR CATHETERIZATION  12/03/2015   PERIPHERAL VASCULAR CATHETERIZATION N/A 12/03/2015   Procedure: Abdominal Aortogram;  Surgeon: Lorretta Harp, MD;  Location: [place] CV LAB;  Service: Cardiovascular;  Laterality: N/A;   PERIPHERAL VASCULAR CATHETERIZATION Bilateral 12/03/2015   Procedure: Lower Extremity Angiography;  Surgeon: Lorretta Harp, MD;  Location: [place] [facility];  Service: Cardiovascular;  Laterality: Bilateral;   PERIPHERAL VASCULAR CATHETERIZATION Bilateral 12/03/2015   Procedure: Peripheral Vascular Intervention;  Surgeon: Lorretta Harp, MD;  Location: [place] [facility];  Service: Cardiovascular;  Laterality: Bilateral;  70mmx58mm Lifestream bilateral Common Iliac Arteries   PERIPHERAL VASCULAR CATHETERIZATION Bilateral 12/03/2015   Procedure: Peripheral Vascular Atherectomy;  Surgeon: Lorretta Harp, MD;  Location: [place] [facility];  Service:  Cardiovascular;  Laterality: Bilateral;  Common Iliac Arteries   THYROIDECTOMY       Home Medications:  Prior to Admission medications   Medication Sig Start Date End Date Taking? Authorizing Provider  acetaminophen (TYLENOL) 500 MG tablet Take 1,000 mg by mouth 2 (two) times daily as needed for moderate pain (for pain.).   Yes [provider]  aspirin EC 81 MG tablet Take 81 mg by mouth every evening.   Yes [provider]  atorvastatin (LIPITOR) 40 MG tablet TAKE 1 TABLET(40 MG) BY MOUTH DAILY Patient  taking differently: Take 40 mg by mouth daily. 11/03/20  Yes Medina-Vargas, Monina C, NP  hydroxypropyl methylcellulose / hypromellose (ISOPTO TEARS / GONIOVISC) 2.5 % ophthalmic solution Place 1 drop into both eyes 2 (two) times daily as needed for dry eyes.   Yes [provider]  Magnesium Hydroxide (MILK OF MAGNESIA PO) Take 1 Dose by mouth daily as needed (constipation).   Yes [provider]  METAMUCIL FIBER PO Take 1 packet by mouth daily.   Yes [provider]  metoprolol succinate (TOPROL XL) 25 MG 24 hr tablet Take 0.5 tablets (12.5 mg total) by mouth daily. Patient taking differently: Take 12.5 mg by mouth 2 (two) times daily. 11/03/20  Yes Medina-Vargas, Monina C, NP  Sodium Phosphates (RA SALINE ENEMA RE) Place 1 Dose rectally daily as needed (constipation).   Yes [provider]  vitamin B-12 (CYANOCOBALAMIN) 1000 MCG tablet Take 1,000 mcg by mouth daily.   Yes [provider]  albuterol (VENTOLIN HFA) 108 (90 Base) MCG/ACT inhaler Inhale 2 puffs into the lungs every 6 (six) hours as needed for wheezing or shortness of breath. 11/03/20   Medina-Vargas, Monina C, NP    Inpatient Medications: Scheduled Meds:  feeding supplement  237 mL Oral BID BM   metoprolol succinate  12.5 mg Oral Daily   mupirocin ointment  1 application Nasal BID   Continuous Infusions:  PRN Meds: acetaminophen **OR** acetaminophen, hydrALAZINE, HYDROmorphone (DILAUDID) injection, ondansetron **OR** ondansetron (ZOFRAN) IV  Allergies:    Allergies  Allergen Reactions   Septra [Sulfamethoxazole-Trimethoprim] Itching    Pt states she had a severe allergic reaction; she was in the ICU for 2 days because of taking this.   Ciprofloxacin Hcl Itching   Codeine Rash    Made her feel very strange    Social History:   Social History   Socioeconomic History   Marital status: Divorced    Spouse name: Not on file   Number of children: 3   Years of education: Not on  file   Highest education level: Not on file  Occupational History   Occupation: retired   Tobacco Use   Smoking status: Every Day    Packs/day: 0.50    Years: 67.00    Pack years: 33.50    Types: Cigarettes   Smokeless tobacco: Never   Tobacco comments:    1/2 ppd  Substance and Sexual Activity   Alcohol use: No    Alcohol/week: 0.0 standard drinks   Drug use: No   Sexual activity: Never  Other Topics Concern   Not on file  Social History Narrative   Lives by herself , still drives some    Has 3 children, 1 living son    Emergency contact: Gyanna Jarema [phone], he lives in the [place] area   Lost a son ~ 2017, he had  lung ca, stroke (09-2011)    Lost another son Dominica Severin) 01/2019  Lost a sister died 12-15           Social Determinants of Radio broadcast assistant Strain: Not on file  Food Insecurity: Not on file  Transportation Needs: Not on file  Physical Activity: Not on file  Stress: Not on file  Social Connections: Not on file  Intimate Partner Violence: Not on file    Family History:    Family History  Problem Relation Age of Onset   Cancer Other        sister (lung) , brother (mouth)   Colon cancer Neg Hx    Coronary artery disease Neg Hx    Diabetes type II Neg Hx    Breast cancer Neg Hx      ROS:  Constitutional: Denied fever, chills Eyes: Denied vision change or loss Ears/Nose/Mouth/Throat: Denied ear ache, sore throat, coughing, sinus pain Cardiovascular: denied chest pain/pressure Respiratory: denied shortness of breath Gastrointestinal: Denied nausea, vomiting, abdominal pain, diarrhea Genital/Urinary: Denied dysuria Musculoskeletal: left shoulder pain Skin: Denied rash, wound Neuro: Denied headache, dizziness, syncope Psych: denied history of depression/anxiety  Endocrine: history of diabetes    Physical Exam/Data:   Vitals:   11/07/20 2005 11/07/20 2124 11/08/20 0050 11/08/20 0502  BP: (!) 188/104 (!) 193/85 (!) 165/82 (!) 167/79   Pulse: 71 75 91 81  Resp: 11 17 17 17   Temp:  97.7 F (36.5 C) (!) 97.5 F (36.4 C) 98.2 F (36.8 C)  TempSrc:  Oral Oral Oral  SpO2: 94% 96% 93% 95%  Weight:  47 kg    Height:  5\' 5"  (1.651 m)     No intake or output data in the 24 hours ending 11/08/20 0810 Last 3 Weights 11/07/2020 11/02/2020 10/08/2020  Weight (lbs) 103 lb 9.9 oz 101 lb 12.8 oz 115 lb 6 oz  Weight (kg) 47 kg 46.176 kg 52.334 kg     Body mass index is 17.24 kg/m.   Vitals:  Vitals:   11/08/20 0050 11/08/20 0502  BP: (!) 165/82 (!) 167/79  Pulse: 91 81  Resp: 17 17  Temp: (!) 97.5 F (36.4 C) 98.2 F (36.8 C)  SpO2: 93% 95%   General Appearance: In no apparent distress, laying in bed, frail elderly HEENT: Left temporal abrasion with bruise noted, EOMI Neck: Supple, trachea midline, no JVDs Cardiovascular: Regular rate and rhythm, normal S1-S2,  no murmur/rub/gallop Respiratory: Resting breathing unlabored, lungs sounds clear to auscultation bilaterally, no use of accessory muscles. On room air.  No wheezes, rales or rhonchi.   Gastrointestinal: Bowel sounds positive, abdomen soft Extremities: LUE in sling, left hand is warm to touch, able to sense touch, radial pulse +; no BLE edema  Genitourinary: No CVA tenderness / genital exam not performed Musculoskeletal: Generalized muscular atrophy, LUE in sling Skin: Intact, warm, dry. No rashes or petechiae noted in exposed areas.  Neurologic: Alert, oriented to slef/place/time, + memory loss and mild cognitive deficit. Fluent speech, no gross focal neuro deficit Psychiatric: Normal affect. Mood is appropriate.    EKG:  The EKG was personally reviewed and demonstrates:    EKG showed sinus rhythm with ventricular rate of 81, LVH, T wave flattening of 1 and aVL, LAE, no significant change.  Telemetry:  Telemetry was personally reviewed and demonstrates:    Sinus rhythm with ventricular rate of 80s this morning, intermittent PVCs, brief run of sinus  tachycardia with rate 140s noted this a.m.   Relevant CV Studies:  Echo 10/01/20:   1. Significant hypertrophy of  LV apex with slit like cavity of LV apex.  Frequent ectopy/bigeminy, makes assessment of LVEF difficult OVerall LVEF  appears mildly decreased.. The left ventricular internal cavity size was  mildly dilated.   2. Right ventricular systolic function is low normal. The right  ventricular size is normal. There is mildly elevated pulmonary artery  systolic pressure.   3. Left atrial size was mildly dilated.   4. Mild mitral valve regurgitation.   5. Tricuspid valve regurgitation is mild to moderate.   6. The aortic valve is tricuspid. Aortic valve regurgitation is not  visualized. Mild to moderate aortic valve sclerosis/calcification is  present, without any evidence of aortic stenosis.   7. The inferior vena cava is normal in size with greater than 50%  respiratory variability, suggesting right atrial pressure of 3 mmHg.   Zio monitor 09/13/20:  Patch Wear Time:  3 days and 3 hours (2022-08-07T09:07:09-398 to 2022-08-10T13:04:10-398)   Patient had a min HR of 52 bpm, max HR of 182 bpm, and avg HR of 70 bpm. Predominant underlying rhythm was Sinus Rhythm. 1 run of Ventricular Tachycardia occurred lasting 8 beats with a max rate of 141 bpm (avg 131 bpm). 7 Supraventricular Tachycardia  runs occurred, the run with the fastest interval lasting 7 beats with a max rate of 182 bpm, the longest lasting 13.6 secs with an avg rate of 107 bpm. Isolated SVEs were rare (<1.0%), SVE Couplets were rare (<1.0%), and SVE Triplets were rare (<1.0%).  Isolated VEs were frequent (15.2%, 48737), VE Couplets were rare (<1.0%, 564), and VE Triplets were rare (<1.0%, 66). Ventricular Bigeminy and Trigeminy were present.  6 patient triggered events, corresponding to sinus rhythm  PACs/PVCs and ventricular bigeminy.   Echo from 09/25/2016:  - Left ventricle: The cavity size was normal. Wall thickness  was    normal. Systolic function was normal. The estimated ejection    fraction was in the range of 60% to 65%. Although no diagnostic    regional wall motion abnormality was identified, this possibility    cannot be completely excluded on the basis of this study. Doppler    parameters are consistent with abnormal left ventricular    relaxation (grade 1 diastolic dysfunction).  - Tricuspid valve: There was mild-moderate regurgitation directed    centrally.  - Pulmonary arteries: Systolic pressure was mildly increased. PA    peak pressure: 37 mm Hg (S).   Impressions:   - Images of left ventricle are foreshortened and difficult to    evaluate even with Definity contrast. There is a suggestion of    apical variant hypertrophic cardiomyopathy.    Laboratory Data:  High Sensitivity Troponin:  No results for input(s): TROPONINIHS in the last 720 hours.   Chemistry Recent Labs  Lab 11/07/20 1931 11/08/20 0716  NA 139 139  K 5.5* 3.8  CL 105 108  CO2 27 26  GLUCOSE 105* 116*  BUN 18 15  CREATININE 0.74 0.49  CALCIUM 8.1* 8.8*  GFRNONAA >60 >60  ANIONGAP 7 5    No results for input(s): PROT, ALBUMIN, AST, ALT, ALKPHOS, BILITOT in the last 168 hours. Lipids No results for input(s): CHOL, TRIG, HDL, LABVLDL, LDLCALC, CHOLHDL in the last 168 hours.  Hematology Recent Labs  Lab 11/07/20 1931 11/08/20 0716  WBC 10.7* 11.4*  RBC 4.13 4.18  HGB 12.6 12.8  HCT 38.5 38.9  MCV 93.2 93.1  MCH 30.5 30.6  MCHC 32.7 32.9  RDW 14.3 14.4  PLT 244 260  Thyroid No results for input(s): TSH, FREET4 in the last 168 hours.  BNPNo results for input(s): BNP, PROBNP in the last 168 hours.  DDimer No results for input(s): DDIMER in the last 168 hours.   Radiology/Studies:  CT Head Wo Contrast  Result Date: 11/07/2020 CLINICAL DATA:  [place], left shoulder injury EXAM: CT HEAD WITHOUT CONTRAST TECHNIQUE: Contiguous axial images were obtained from the base of the skull through the vertex  without intravenous contrast. COMPARISON:  10/10/2020 FINDINGS: Brain: Stable confluent hypodensities throughout the periventricular white matter consistent with chronic small-vessel ischemic changes. Chronic lacunar infarcts are again seen within the left basal ganglia. No signs of acute infarct or hemorrhage. Lateral ventricles and remaining midline structures are unremarkable. No acute extra-axial fluid collections. No mass effect. Vascular: Stable atherosclerosis.  No hyperdense vessel. Skull: Normal. Negative for fracture or focal lesion. Sinuses/Orbits: No acute finding. Other: None. IMPRESSION: 1. Stable chronic ischemic changes.  No acute intracranial process. Electronically Signed   By: Randa Ngo M.D.   On: 11/07/2020 17:31   CT Cervical Spine Wo Contrast  Result Date: 11/07/2020 CLINICAL DATA:  [place], left shoulder injury EXAM: CT CERVICAL SPINE WITHOUT CONTRAST TECHNIQUE: Multidetector CT imaging of the cervical spine was performed without intravenous contrast. Multiplanar CT image reconstructions were also generated. COMPARISON:  None. FINDINGS: Alignment: There is mild retrolisthesis of C4 on C5 and C5 on C6 likely due to spondylosis and facet hypertrophy. Otherwise alignment is anatomic. Skull base and vertebrae: No acute fracture. No primary bone lesion or focal pathologic process. Soft tissues and spinal canal: No prevertebral fluid or swelling. No visible canal hematoma. Disc levels: There is diffuse spondylosis from C3 through C7. Multilevel facet hypertrophy greatest at C2-3. Marked hypertrophic changes at the C1-2 interface. Upper chest: Airway is patent. Dependent atelectasis right upper lobe. Other: Reconstructed images demonstrate no additional findings. IMPRESSION: 1. No acute cervical spine fracture. 2. Multilevel degenerative changes. Electronically Signed   By: Randa Ngo M.D.   On: 11/07/2020 17:35   CT Shoulder Left Wo Contrast  Result Date: 11/07/2020 CLINICAL DATA:   Shoulder trauma, scapular fracture EXAM: CT OF THE UPPER LEFT EXTREMITY WITHOUT CONTRAST TECHNIQUE: Multidetector CT imaging of the upper left extremity was performed according to the standard protocol. COMPARISON:  None. FINDINGS: Bones/Joint/Cartilage Comminuted fracture of the left greater tuberosity with 2 cm of lateral displacement. Anterior shoulder dislocation with the humeral head perched on the anterior glenoid neck. No other acute fracture or dislocation. No aggressive osseous lesion. Mild arthropathy of the acromioclavicular joint. Ligaments Ligaments are suboptimally evaluated by CT. Muscles and Tendons Muscles are normal.  No muscle atrophy. Soft tissue No fluid collection or hematoma. No soft tissue mass. Thoracic aortic atherosclerosis. Carotid artery atherosclerosis. IMPRESSION: 1. Comminuted fracture of the left greater tuberosity with 2 cm of lateral displacement. Anterior shoulder dislocation with the humeral head perched on the anterior glenoid neck. Electronically Signed   By: Kathreen Devoid M.D.   On: 11/07/2020 19:37   DG Chest Portable 1 View  Result Date: 11/07/2020 CLINICAL DATA:  Status post fall, left shoulder pain EXAM: PORTABLE CHEST 1 VIEW COMPARISON:  10/18/2020 FINDINGS: Mild bilateral interstitial thickening. Small left pleural effusion. Mild left basilar airspace disease concerning for atelectasis. Stable cardiomediastinal silhouette. No acute osseous abnormality. IMPRESSION: 1. Bilateral interstitial thickening concerning for mild interstitial edema with a small left pleural effusion. Electronically Signed   By: Kathreen Devoid M.D.   On: 11/07/2020 19:33   DG Shoulder Left  Result  Date: 11/07/2020 CLINICAL DATA:  Fall EXAM: LEFT SHOULDER - 2+ VIEW COMPARISON:  None. FINDINGS: Anterior inferior dislocation of left humeral head with respect to the glenoid fossa. Comminuted displaced fracture involving the superolateral humeral head with probable fracture at the inferior glenoid.  IMPRESSION: Anterior dislocation of the left humeral head with associated comminuted fracture of the superolateral humeral head and suspected fracture of the inferior glenoid Electronically Signed   By: Donavan Foil M.D.   On: 11/07/2020 17:05     Assessment and Plan:    Preop cardiac evaluation - RCRI score 1, 0.9 percent perioperative risk of major cardiac event - DASI score 7.2, functional capacity METs 3.63 - Based on her age and comorbidity, she is at moderate to high risk for upcoming shoulder surgery, she is clinically optimized without absolute contraindication for surgery today  - pending final MD recommendation for pre-op clearance, see addendum   Hypertrophic cardiomyopathy -Apical hypertrophic cardiomyopathy noted on echo 10/01/2020 -EF 60 to 65% from echo 09/25/2016 - she does not re-call any dizziness or heart palpitation at home, denied any chest pain or syncope  -She has refused cardiac MRI for any invasive diagnostic procedure for further work-up, remains the same wish today - Continue low-dose beta-blocker - we will follow post-operatively to see cardiac needs   Mild pulmonary hypertension Mild to moderate tricuspid regurgitation -Noted on echo from 09/25/2016 as well as 10/01/2020 -Clinically she is euvolemic today -She does not want invasive diagnostic procedure for workup, encouraged quitting smoking  Hypertension - BP quite elevated up to 212/89, likely due to pain - Optimize pain control, continue metoprolol XL 12.5mg  daily -Felodipine and lisinopril recently discontinued in August 2022 office visit due to low normal BP, will start low dose hydralazine 25mg  BID today for BP control while in house   Hyperlipidemia -LDL 79 07/19/2018 -Resume aspirin and statin if no contraindication per primary team   PAD -No significant change on Doppler study 08/2020, follow-up with Dr. Gwenlyn Found upon discharge, has not been seen in over 2 years -Resume aspirin and statin if no  contraindication   Left humeral head dislocation with comminuted fracture of the superolateral humeral head Mechanical fall  Tobacco abuse Type 2 diabetes History of TIA COPD Debility -Managed per IM       For questions or updates, please contact [facility] Please consult [email or web address] for contact info under    Signed, Margie Billet, NP  11/08/2020 8:10 AM  Patient seen and examined with Margie Billet NP.  Agree as above, with the following exceptions and changes as noted below. Ms. Bain is an 87 yo female with new dx of apical hypertrophic cardiomyopathy in hte setting of dizziness, presyncope and palpitations. Hx of HTN, HLD, TIA, DM2, and COPD, carotid artery disease, and some memory loss. Dr. Lissa Hoard in anesthesia is on the phone with the patient's son reviewing anesthesia plan at the time of my visit. I spoke to the patient who states she has not had recent worsening of SOB, no LE edema, and no chest pain. Gen: NAD, CV: RRR, no murmurs, Lungs: clear, Abd: soft, Extrem: Warm, well perfused, no edema, Neuro/Psych: resting but responds appropriately to questions. All available labs, radiology testing, previous records reviewed.   I've reviewed the images from her echocardiogram which show apical HCM with possible intracavitary obstruction in the apex, likely not hemodynamically significant. No apical pouch as a nidus for ventricular arrhythmia. No LVOT obstruction. Moderate MR.  The patient is moderate-high risk for intermediate risk  procedure from a cardiovascular perspective. Risk contributed to by vascular disease, recent ventricular ectopy in setting of apical HCM, and mild pulmonary HTN.   No further cardiovascular testing is required prior to the procedure.  If this level of risk is acceptable to the patient and surgical team, the patient should be considered optimized from a cardiovascular standpoint. The patient states she is agreeable to proceed particularly since her son has  been involved in the conversation. Plan is for regional anesthesia with the hope to avoid general anesthesia given patient's frail status and recent episodes of encephalopathy.   Elouise Munroe, MD 11/08/20 12:47 PM

## 2020-11-08 NOTE — Telephone Encounter (Signed)
Crystal Osborne from [facility] called to inform Larose Kells that on 9/28 patient fell and broke her arm.She went to the hospital and has to have surgery. If any new questions develop please contact [phone]. Please advise.

## 2020-11-09 ENCOUNTER — Inpatient Hospital Stay (HOSPITAL_COMMUNITY): Payer: Medicare Other

## 2020-11-09 DIAGNOSIS — S4292XA Fracture of left shoulder girdle, part unspecified, initial encounter for closed fracture: Secondary | ICD-10-CM | POA: Diagnosis not present

## 2020-11-09 DIAGNOSIS — F0390 Unspecified dementia without behavioral disturbance: Secondary | ICD-10-CM | POA: Diagnosis not present

## 2020-11-09 DIAGNOSIS — I10 Essential (primary) hypertension: Secondary | ICD-10-CM | POA: Diagnosis not present

## 2020-11-09 DIAGNOSIS — E890 Postprocedural hypothyroidism: Secondary | ICD-10-CM | POA: Diagnosis not present

## 2020-11-09 LAB — CBC
HCT: 38.2 % (ref 36.0–46.0)
Hemoglobin: 12.6 g/dL (ref 12.0–15.0)
MCH: 30.7 pg (ref 26.0–34.0)
MCHC: 33 g/dL (ref 30.0–36.0)
MCV: 93.2 fL (ref 80.0–100.0)
Platelets: 261 10*3/uL (ref 150–400)
RBC: 4.1 MIL/uL (ref 3.87–5.11)
RDW: 14.3 % (ref 11.5–15.5)
WBC: 9.3 10*3/uL (ref 4.0–10.5)
nRBC: 0 % (ref 0.0–0.2)

## 2020-11-09 LAB — BASIC METABOLIC PANEL
Anion gap: 10 (ref 5–15)
BUN: 13 mg/dL (ref 8–23)
CO2: 23 mmol/L (ref 22–32)
Calcium: 8.3 mg/dL — ABNORMAL LOW (ref 8.9–10.3)
Chloride: 104 mmol/L (ref 98–111)
Creatinine, Ser: 0.44 mg/dL (ref 0.44–1.00)
GFR, Estimated: >60 mL/min (ref >60)
Glucose, Bld: 111 mg/dL — ABNORMAL HIGH (ref 70–99)
Potassium: 3.8 mmol/L (ref 3.5–5.1)
Sodium: 137 mmol/L (ref 135–145)

## 2020-11-09 MED ORDER — HYDRALAZINE HCL 25 MG PO TABS
25.0000 mg | ORAL_TABLET | Freq: Three times a day (TID) | ORAL | Status: DC
  Administered 2020-11-09 – 2020-11-11 (×5): 25 mg via ORAL
  Filled 2020-11-09 (×6): qty 1

## 2020-11-09 NOTE — Care Management Important Message (Signed)
Medicare IM printed for Social Work at [facility] to give to the patient 

## 2020-11-09 NOTE — Progress Notes (Signed)
Pt slept quietly in no acute distress or discomfort. Denies any pain. Tolerated turning and repositioning . Has not been fully awake to do Incentive spirometry. SCDs conts.

## 2020-11-09 NOTE — Plan of Care (Signed)

## 2020-11-09 NOTE — Anesthesia Postprocedure Evaluation (Signed)
Anesthesia Post Note  Patient: Crystal Osborne  Procedure(s) Performed: CLOSED MANIPULATION SHOULDER (Left: Shoulder)     Patient location during evaluation: PACU Anesthesia Type: Regional Level of consciousness: awake and alert Pain management: pain level controlled Vital Signs Assessment: post-procedure vital signs reviewed and stable Respiratory status: spontaneous breathing Cardiovascular status: stable Anesthetic complications: no   No notable events documented.  Last Vitals:  Vitals:   11/09/20 1020 11/09/20 1416  BP: (!) 165/76 (!) 145/67  Pulse: 77 80  Resp:  20  Temp:  (!) 36.3 C  SpO2:  97%    Last Pain:  Vitals:   11/09/20 1416  TempSrc: Oral  PainSc:                  Nolon Nations

## 2020-11-09 NOTE — Plan of Care (Signed)

## 2020-11-09 NOTE — Progress Notes (Signed)
PATIENT ID: Crystal Osborne  MRN: [id number]  DOB/AGE:  1931-02-19 / 87 y.o.  1 Day Post-Op Procedure(s) (LRB): CLOSED MANIPULATION SHOULDER (Left)  Subjective: Patient sleeping in room upon entry during rounds. She then awoke reporting mild discomfort in the left shoulder. Was able to follow commands and move finger in left hand.   Objective: Vital signs in last 24 hours: Temp:  [97.6 F (36.4 C)-98.2 F (36.8 C)] 97.8 F (36.6 C) (09/30 0707) Pulse Rate:  [42-82] 80 (09/30 0707) Resp:  [14-34] 16 (09/30 0707) BP: (136-191)/(54-81) 177/73 (09/30 0707) SpO2:  [89 %-100 %] 99 % (09/30 0707)  Intake/Output from previous day: 09/29 0701 - 09/30 0700 In: 1961 [I.V.:1961] Out: 700 [Urine:700] Intake/Output this shift: No intake/output data recorded.  Recent Labs    11/07/20 1931 11/08/20 0716 11/09/20 0504  HGB 12.6 12.8 12.6   Recent Labs    11/08/20 0716 11/09/20 0504  WBC 11.4* 9.3  RBC 4.18 4.10  HCT 38.9 38.2  PLT 260 261   Recent Labs    11/08/20 0716 11/09/20 0504  NA 139 137  K 3.8 3.8  CL 108 104  CO2 26 23  BUN 15 13  CREATININE 0.49 0.44  GLUCOSE 116* 111*  CALCIUM 8.8* 8.3*    Physical Exam: Neurovascular intact No swelling or erythema over the left shoulder Left shoulder in sling as instructed Able to move fingers and hand of left UE but weak grip strength  Assessment/Plan: 1 Day Post-Op Procedure(s) (LRB): CLOSED MANIPULATION SHOULDER (Left)   Discharge disposition per medicine service Non Weight Bearing (NWB) left UE  Left UE must remain in sling at all times, including sleeping. It can be removed for bathing and dressing but the patient must keep her arm at her side. Follow up in office in 2 weeks with Dr Tamera Punt.    Langley Flatley L. [place], PA-C 11/09/2020, 8:03 AM

## 2020-11-09 NOTE — Consult Note (Signed)
   [facility] Inpatient Consult   11/09/2020  [name] [birth year] [id number]  Coverage for [employer] [ACO] Patient: Crystal Osborne Medicare  Primary Care Provider:  Colon Branch, MD, [facility], [place], is an Embedded provider  Patient screened for hospital readmission less than 30 days to assess for potential [facility] service needs for post hospital transition.   Plan:  Continue to follow progress and disposition when assessed by inpatient [facility] team for post hospital care management needs.    For questions contact:   Natividad Brood, RN BSN [facility] Liaison  [phone] business mobile phone Toll free office [phone]  Fax number: [phone] [email or web address]

## 2020-11-09 NOTE — Progress Notes (Signed)
PROGRESS NOTE  Crystal Osborne  TTS:[id number] DOB: [birth year] DOA: 11/07/2020 PCP: Colon Branch, MD   Brief Narrative: Crystal Osborne is a 87 y.o. female with a history of AAA, right breast CA, COPD, CVA, HTN, HLD, prediabetes, admission for pneumonia complicated by delirium earlier this month, subsequently discharged to [facility] 9/9, then to home 9/23, and fell at home with [name] and subsequent left shoulder fracture and dislocation not able to be reduced in ED. Orthopedics is consulted, recommending reduction (closed vs. open) in OR. Cardiology was consulted for recent echo suggesting apical hypertrophic cardiomyopathy and interstitial thickening and small left pleural effusion on CXR, deemed the patient at mod-severe risk for perioperative complications. On 9/29 she was taken to the OR for successful closed reduction under regional nerve block.  Assessment & Plan: Principal Problem:   Fracture dislocation of left shoulder joint, closed, initial encounter Active Problems:   Essential hypertension   History of thyroidectomy   Hypertrophic cardiomyopathy (HCC)   Dysphagia   Dementia without behavioral disturbance (HCC)  Left shoulder fracture, dislocation: s/p closed reduction under interscalene regional nerve block in OR 9/29.  - Orthopedics recommends NWB LUE, keep in sling at all times including sleeping, removed only for bathing or dressing. Follow up with Dr. Tamera Punt in 2 weeks.   Falls at home: Typically gets around with a walker and is now NWB to LUE.  - Will require intensive PT/OT to improve functional independence. PT eval pending  Apical hypertrophic cardiomyopathy, frequent PVCs:  - Continue metoprolol succinate.  - Cardiology is consulted - Has declined further investigations per cardiology.  Interstitial pulmonary edema, small left pleural effusion suggestive of acute HFrEF: Last echo LVEF appeared mildly decreased.   - BNP grossly elevated, though minimal adventitious lung sounds  and no peripheral edema. Will check CXR today, consider low dose lasix.   HTN: BP elevated, likely pain contributing. - Continue metoprolol, added hydralazine 9/29, increase to 25mg  TID.  - Heart healthy diet once no longer NPO  Hyperkalemia: Check BMP and if remains, give lokelma and other mitigating medications.  HLD:  - Continue statin  History of CVA:  - Continue ASA, statin once taking po.  PAD: Underwent angiography 12/03/15 revealing high-grade calcified ostial bilateral iliac artery stenoses with three-vessel runoff. She was treated with diamondback orbital rotational atherectomy, PTA and covered stenting using Lifestream cover stents of both iliac ostia. - Continue ASA, statin  Dementia with history of acute hospital delirium:  - Delirium precautions.  - Would avoid haldol as this may be implicated in tremors previously.   History of thyroidectomy: Must not have been total as she's not on supplement and TSH is wnl.  DVT prophylaxis: SCDs Code Status: DNR Family Communication: Son at bedside Disposition Plan:  Status is: Inpatient  Remains inpatient appropriate because:Ongoing diagnostic testing needed not appropriate for outpatient work up, Unsafe d/c plan, and Inpatient level of care appropriate due to severity of illness  Dispo: The patient is from: Home              Anticipated d/c is to: SNF most likely              Patient currently is not medically stable to d/c.  Consultants:  Orthopedics Cardiology  Procedures:  11/08/2020 Dr. Tamera Punt: Closed reduction left shoulder fracture dislocation under anesthesia  Antimicrobials: None   Subjective: Left shoulder soreness is painless when not moving, moderate with movements. No chest pain. She denies shortness of breath but requested to  be on oxygen earlier this morning. No cough or fever. No leg swelling. got IVF postop last night.  Objective: Vitals:   11/09/20 0110 11/09/20 0707 11/09/20 1019 11/09/20 1020   BP: (!) 174/79 (!) 177/73 (!) 165/76 (!) 165/76  Pulse: (!) 42 80  77  Resp: 20 16    Temp: 98.2 F (36.8 C) 97.8 F (36.6 C)    TempSrc: Oral Oral    SpO2: 98% 99%    Weight:      Height:        Intake/Output Summary (Last 24 hours) at 11/09/2020 1403 Last data filed at 11/09/2020 0600 Gross per 24 hour  Intake 1960.97 ml  Output 700 ml  Net 1260.97 ml   Filed Weights   11/07/20 2124  Weight: 47 kg   Gen: Pleasant, elderly female in no distress Pulm: Nonlabored breathing supplemental oxygen. Minimal crackles at bases.. CV: Regular rate and rhythm. No murmur, rub, or gallop. No JVD, no dependent edema. GI: Abdomen soft, non-tender, non-distended, with normoactive bowel sounds.  Ext: Warm, no deformities Skin: No new rashes, lesions or ulcers on visualized skin. Left frontotemporal abrasion is hemostatic, healing.  Neuro: Alert and incompletely oriented. No focal neurological deficits. Psych: Judgement and insight appear marginal. Mood euthymic & affect congruent. Behavior is appropriate.    Data Reviewed: I have personally reviewed following labs and imaging studies  CBC: Recent Labs  Lab 11/07/20 1931 11/08/20 0716 11/09/20 0504  WBC 10.7* 11.4* 9.3  HGB 12.6 12.8 12.6  HCT 38.5 38.9 38.2  MCV 93.2 93.1 93.2  PLT 244 260 867   Basic Metabolic Panel: Recent Labs  Lab 11/07/20 1931 11/08/20 0716 11/09/20 0504  NA 139 139 137  K 5.5* 3.8 3.8  CL 105 108 104  CO2 27 26 23   GLUCOSE 105* 116* 111*  BUN 18 15 13   CREATININE 0.74 0.49 0.44  CALCIUM 8.1* 8.8* 8.3*   GFR: Estimated Creatinine Clearance: 36.8 mL/min (by C-G formula based on SCr of 0.44 mg/dL). Liver Function Tests: No results for input(s): AST, ALT, ALKPHOS, BILITOT, PROT, ALBUMIN in the last 168 hours. No results for input(s): LIPASE, AMYLASE in the last 168 hours. No results for input(s): AMMONIA in the last 168 hours. Coagulation Profile: No results for input(s): INR, PROTIME in the last  168 hours. Cardiac Enzymes: No results for input(s): CKTOTAL, CKMB, CKMBINDEX, TROPONINI in the last 168 hours. BNP (last 3 results) No results for input(s): PROBNP in the last 8760 hours. HbA1C: No results for input(s): HGBA1C in the last 72 hours. CBG: Recent Labs  Lab 11/08/20 1449  GLUCAP 106*   Lipid Profile: No results for input(s): CHOL, HDL, LDLCALC, TRIG, CHOLHDL, LDLDIRECT in the last 72 hours. Thyroid Function Tests: No results for input(s): TSH, T4TOTAL, FREET4, T3FREE, THYROIDAB in the last 72 hours. Anemia Panel: No results for input(s): VITAMINB12, FOLATE, FERRITIN, TIBC, IRON, RETICCTPCT in the last 72 hours. Urine analysis:    Component Value Date/Time   COLORURINE YELLOW 10/10/2020 1645   APPEARANCEUR HAZY (A) 10/10/2020 1645   LABSPEC 1.010 10/10/2020 1645   PHURINE 5.0 10/10/2020 1645   GLUCOSEU NEGATIVE 10/10/2020 1645   GLUCOSEU NEGATIVE 08/21/2014 1027   HGBUR MODERATE (A) 10/10/2020 1645   HGBUR negative 09/25/2009 [place] 10/10/2020 1645   BILIRUBINUR Neg 05/26/2013 0827   KETONESUR NEGATIVE 10/10/2020 1645   PROTEINUR 100 (A) 10/10/2020 1645   UROBILINOGEN 0.2 08/21/2014 1027   NITRITE POSITIVE (A) 10/10/2020 1645  LEUKOCYTESUR NEGATIVE 10/10/2020 1645   Recent Results (from the past 240 hour(s))  Resp Panel by RT-PCR (Flu A&B, Covid) Nasopharyngeal Swab     Status: None   Collection Time: 11/07/20  7:31 PM   Specimen: Nasopharyngeal Swab; Nasopharyngeal(NP) swabs in vial transport medium  Result Value Ref Range Status   SARS Coronavirus 2 by RT PCR NEGATIVE NEGATIVE Final    Comment: (NOTE) SARS-CoV-2 target nucleic acids are NOT DETECTED.  The SARS-CoV-2 RNA is generally detectable in upper respiratory specimens during the acute phase of infection. The lowest concentration of SARS-CoV-2 viral copies this assay can detect is 138 copies/mL. A negative result does not preclude SARS-Cov-2 infection and should not be used  as the sole basis for treatment or other patient management decisions. A negative result may occur with  improper specimen collection/handling, submission of specimen other than nasopharyngeal swab, presence of viral mutation(s) within the areas targeted by this assay, and inadequate number of viral copies(<138 copies/mL). A negative result must be combined with clinical observations, patient history, and epidemiological information. The expected result is Negative.  Fact Sheet for Patients:  EntrepreneurPulse.com.au  Fact Sheet for Healthcare Providers:  IncredibleEmployment.be  This test is no t yet approved or cleared by the [place] FDA and  has been authorized for detection and/or diagnosis of SARS-CoV-2 by FDA under an Emergency Use Authorization (EUA). This EUA will remain  in effect (meaning this test can be used) for the duration of the COVID-19 declaration under Section 564(b)(1) of the Act, 21 U.S.C.section 360bbb-3(b)(1), unless the authorization is terminated  or revoked sooner.       Influenza A by PCR NEGATIVE NEGATIVE Final   Influenza B by PCR NEGATIVE NEGATIVE Final    Comment: (NOTE) The Xpert Xpress SARS-CoV-2/FLU/RSV plus assay is intended as an aid in the diagnosis of influenza from Nasopharyngeal swab specimens and should not be used as a sole basis for treatment. Nasal washings and aspirates are unacceptable for Xpert Xpress SARS-CoV-2/FLU/RSV testing.  Fact Sheet for Patients: EntrepreneurPulse.com.au  Fact Sheet for Healthcare Providers: IncredibleEmployment.be  This test is not yet approved or cleared by the [place] FDA and has been authorized for detection and/or diagnosis of SARS-CoV-2 by FDA under an Emergency Use Authorization (EUA). This EUA will remain in effect (meaning this test can be used) for the duration of the COVID-19 declaration under Section 564(b)(1)  of the Act, 21 U.S.C. section 360bbb-3(b)(1), unless the authorization is terminated or revoked.  Performed at [facility], [place] [address] [place], [place] [address]   Surgical PCR screen     Status: None   Collection Time: 11/08/20  1:44 AM   Specimen: Nasal Mucosa; Nasal Swab  Result Value Ref Range Status   MRSA, PCR NEGATIVE NEGATIVE Final   Staphylococcus aureus NEGATIVE NEGATIVE Final    Comment: (NOTE) The Xpert SA Assay (FDA approved for NASAL specimens in patients 84 years of age and older), is one component of a comprehensive surveillance program. It is not intended to diagnose infection nor to guide or monitor treatment. Performed at [facility], [place] [address] [place], [place] [address]       Radiology Studies: DG Shoulder 1V Left  Result Date: 11/08/2020 CLINICAL DATA:  Left shoulder reduction EXAM: LEFT SHOULDER; DG C-ARM 1-60 MIN-NO REPORT COMPARISON:  11/07/2020 FINDINGS: Single image obtained via portable C-arm radiography shows interval reduction of left glenohumeral joint dislocation. Fracture deformity involving the greater tuberosity of the proximal humerus  noted. IMPRESSION: Interval reduction of left glenohumeral joint dislocation. Electronically Signed   By: Kerby Moors M.D.   On: 11/08/2020 16:27   CT Head Wo Contrast  Result Date: 11/07/2020 CLINICAL DATA:  [place], left shoulder injury EXAM: CT HEAD WITHOUT CONTRAST TECHNIQUE: Contiguous axial images were obtained from the base of the skull through the vertex without intravenous contrast. COMPARISON:  10/10/2020 FINDINGS: Brain: Stable confluent hypodensities throughout the periventricular white matter consistent with chronic small-vessel ischemic changes. Chronic lacunar infarcts are again seen within the left basal ganglia. No signs of acute infarct or hemorrhage. Lateral ventricles and remaining midline structures are unremarkable. No acute extra-axial fluid  collections. No mass effect. Vascular: Stable atherosclerosis.  No hyperdense vessel. Skull: Normal. Negative for fracture or focal lesion. Sinuses/Orbits: No acute finding. Other: None. IMPRESSION: 1. Stable chronic ischemic changes.  No acute intracranial process. Electronically Signed   By: Randa Ngo M.D.   On: 11/07/2020 17:31   CT Cervical Spine Wo Contrast  Result Date: 11/07/2020 CLINICAL DATA:  [place], left shoulder injury EXAM: CT CERVICAL SPINE WITHOUT CONTRAST TECHNIQUE: Multidetector CT imaging of the cervical spine was performed without intravenous contrast. Multiplanar CT image reconstructions were also generated. COMPARISON:  None. FINDINGS: Alignment: There is mild retrolisthesis of C4 on C5 and C5 on C6 likely due to spondylosis and facet hypertrophy. Otherwise alignment is anatomic. Skull base and vertebrae: No acute fracture. No primary bone lesion or focal pathologic process. Soft tissues and spinal canal: No prevertebral fluid or swelling. No visible canal hematoma. Disc levels: There is diffuse spondylosis from C3 through C7. Multilevel facet hypertrophy greatest at C2-3. Marked hypertrophic changes at the C1-2 interface. Upper chest: Airway is patent. Dependent atelectasis right upper lobe. Other: Reconstructed images demonstrate no additional findings. IMPRESSION: 1. No acute cervical spine fracture. 2. Multilevel degenerative changes. Electronically Signed   By: Randa Ngo M.D.   On: 11/07/2020 17:35   CT Shoulder Left Wo Contrast  Result Date: 11/07/2020 CLINICAL DATA:  Shoulder trauma, scapular fracture EXAM: CT OF THE UPPER LEFT EXTREMITY WITHOUT CONTRAST TECHNIQUE: Multidetector CT imaging of the upper left extremity was performed according to the standard protocol. COMPARISON:  None. FINDINGS: Bones/Joint/Cartilage Comminuted fracture of the left greater tuberosity with 2 cm of lateral displacement. Anterior shoulder dislocation with the humeral head perched on the  anterior glenoid neck. No other acute fracture or dislocation. No aggressive osseous lesion. Mild arthropathy of the acromioclavicular joint. Ligaments Ligaments are suboptimally evaluated by CT. Muscles and Tendons Muscles are normal.  No muscle atrophy. Soft tissue No fluid collection or hematoma. No soft tissue mass. Thoracic aortic atherosclerosis. Carotid artery atherosclerosis. IMPRESSION: 1. Comminuted fracture of the left greater tuberosity with 2 cm of lateral displacement. Anterior shoulder dislocation with the humeral head perched on the anterior glenoid neck. Electronically Signed   By: Kathreen Devoid M.D.   On: 11/07/2020 19:37   DG Chest Portable 1 View  Result Date: 11/07/2020 CLINICAL DATA:  Status post fall, left shoulder pain EXAM: PORTABLE CHEST 1 VIEW COMPARISON:  10/18/2020 FINDINGS: Mild bilateral interstitial thickening. Small left pleural effusion. Mild left basilar airspace disease concerning for atelectasis. Stable cardiomediastinal silhouette. No acute osseous abnormality. IMPRESSION: 1. Bilateral interstitial thickening concerning for mild interstitial edema with a small left pleural effusion. Electronically Signed   By: Kathreen Devoid M.D.   On: 11/07/2020 19:33   DG Shoulder Left  Result Date: 11/07/2020 CLINICAL DATA:  Fall EXAM: LEFT SHOULDER - 2+ VIEW COMPARISON:  None.  FINDINGS: Anterior inferior dislocation of left humeral head with respect to the glenoid fossa. Comminuted displaced fracture involving the superolateral humeral head with probable fracture at the inferior glenoid. IMPRESSION: Anterior dislocation of the left humeral head with associated comminuted fracture of the superolateral humeral head and suspected fracture of the inferior glenoid Electronically Signed   By: Donavan Foil M.D.   On: 11/07/2020 17:05   DG C-Arm 1-60 Min-No Report  Result Date: 11/08/2020 CLINICAL DATA:  Left shoulder reduction EXAM: LEFT SHOULDER; DG C-ARM 1-60 MIN-NO REPORT COMPARISON:   11/07/2020 FINDINGS: Single image obtained via portable C-arm radiography shows interval reduction of left glenohumeral joint dislocation. Fracture deformity involving the greater tuberosity of the proximal humerus noted. IMPRESSION: Interval reduction of left glenohumeral joint dislocation. Electronically Signed   By: Kerby Moors M.D.   On: 11/08/2020 16:27    Scheduled Meds:  aspirin EC  81 mg Oral QPM   atorvastatin  40 mg Oral Daily   docusate sodium  100 mg Oral BID   feeding supplement  1 Container Oral Q24H   feeding supplement  237 mL Oral BID BM   hydrALAZINE  25 mg Oral BID   metoprolol succinate  12.5 mg Oral Daily   multivitamin with minerals  1 tablet Oral Daily   mupirocin ointment  1 application Nasal BID   vitamin B-12  1,000 mcg Oral Daily   Continuous Infusions:   LOS: 2 days   Time spent: 25 minutes.  Patrecia Pour, MD [facility] [email or web address] 11/09/2020, 2:03 PM

## 2020-11-10 ENCOUNTER — Encounter (HOSPITAL_COMMUNITY): Payer: Self-pay | Admitting: Orthopedic Surgery

## 2020-11-10 DIAGNOSIS — I422 Other hypertrophic cardiomyopathy: Secondary | ICD-10-CM | POA: Diagnosis not present

## 2020-11-10 DIAGNOSIS — S4292XA Fracture of left shoulder girdle, part unspecified, initial encounter for closed fracture: Secondary | ICD-10-CM | POA: Diagnosis not present

## 2020-11-10 DIAGNOSIS — I10 Essential (primary) hypertension: Secondary | ICD-10-CM | POA: Diagnosis not present

## 2020-11-10 DIAGNOSIS — E890 Postprocedural hypothyroidism: Secondary | ICD-10-CM | POA: Diagnosis not present

## 2020-11-10 MED ORDER — PSYLLIUM 95 % PO PACK
1.0000 | PACK | Freq: Every day | ORAL | Status: DC
  Filled 2020-11-10 (×4): qty 1

## 2020-11-10 NOTE — Evaluation (Signed)
Physical Therapy Evaluation Patient Details Name: Crystal Osborne MRN: [id number] DOB: [birth year] Today's Date: 11/10/2020  History of Present Illness  Crystal Osborne is an 87 y.o. female presents after fall with left proximal humeral fracture dislocation; s/p closed reduction L shoulder fracture dislocation under anesthesia 9/29. PMH: AAA, breast CA, COPD, CVA, diabetes, HTN, osteoporosis, shingles, TIA   Clinical Impression  Pt admitted with above diagnosis. At baseline, pt reports using RW in the home, aides come a few days/week to assist with needs, son completes grocery shopping and lawncare, pt able to complete simple meals and sponge bathing. Pt currently requiring min guard to power to stand and min guard/assist with ambulation in room and single HHA, minor LOB requiring min A to recover. Pt requires reminders of precautions and total A for sling placement in proper position. Pt states family won't let her stay home alone, but unable to states who specifically will be able to assist her. Recommend 24 hr assist at d/c, SNF vs HHPT pending support. Pt currently with functional limitations due to the deficits listed below (see PT Problem List). Pt will benefit from skilled PT to increase their independence and safety with mobility to allow discharge to the venue listed below.          Recommendations for follow up therapy are one component of a multi-disciplinary discharge planning process, led by the attending physician.  Recommendations may be updated based on patient status, additional functional criteria and insurance authorization.  Follow Up Recommendations Supervision/Assistance - 24 hour (SNF vs HHPT with family assist)    Equipment Recommendations  None recommended by PT    Recommendations for Other Services       Precautions / Restrictions Precautions Precautions: Fall Precaution Comments: 1 fall last week. NO ROM sling off for bathing/dressing only. Required Braces or Orthoses:  Sling Restrictions Weight Bearing Restrictions: Yes LUE Weight Bearing: Non weight bearing      Mobility  Bed Mobility Overal bed mobility: Needs Assistance Bed Mobility: Supine to Sit  Supine to sit: Min guard;HOB elevated  General bed mobility comments: slow to mobilize to EOB, VCs to avoid LUE use    Transfers Overall transfer level: Needs assistance Equipment used: 1 person hand held assist Transfers: Sit to/from Stand Sit to Stand: Min guard  General transfer comment: min guard to steady with powering to stand, RUE use to power up, weight posterior requiring VCs to shift weight anterior and place feet flat on floor  Ambulation/Gait Ambulation/Gait assistance: Min assist;Min guard Gait Distance (Feet): 20 Feet Assistive device: 1 person hand held assist Gait Pattern/deviations: Step-through pattern;Decreased stride length Gait velocity: decreased   General Gait Details: step through pattern with LUE in sling, single HHA on R side, minor LOB due to posterior weight requiring min A to recover  Stairs            Wheelchair Mobility    Modified Rankin (Stroke Patients Only)       Balance Overall balance assessment: Mild deficits observed, not formally tested        Pertinent Vitals/Pain Pain Assessment: No/denies pain    Home Living Family/patient expects to be discharged to:: Private residence Living Arrangements: Alone Available Help at Discharge: Family;Available PRN/intermittently;Personal care attendant Type of Home: House Home Access: Ramped entrance     Home Layout: Two level;Able to live on main level with bedroom/bathroom Home Equipment: Gilford Rile - 2 wheels;Cane - single point;Shower seat;Bedside commode Additional Comments: son assists with lawn and grocery shopping  Prior Function Level of Independence: Independent with assistive device(s)  Comments: pt reports son does lawn care and grocery shopping. pt taking sponge bath, simple microwave  cooking, household ambulator with RW. pt with aides 12-5pm 3-4 days/wk     Hand Dominance   Dominant Hand: Right    Extremity/Trunk Assessment   Upper Extremity Assessment Upper Extremity Assessment: Defer to OT evaluation LUE Deficits / Details: patient is NO ROM per Ortho and NWB with sling in place at all times unless bathing/dressing    Lower Extremity Assessment Lower Extremity Assessment: Overall WFL for tasks assessed (AROM WNL, strength grossly 4-/5, denies numbness/tingling)    Cervical / Trunk Assessment Cervical / Trunk Assessment: Normal  Communication   Communication: No difficulties  Cognition Arousal/Alertness: Awake/alert Behavior During Therapy: WFL for tasks assessed/performed Overall Cognitive Status: Within Functional Limits for tasks assessed       General Comments      Exercises     Assessment/Plan    PT Assessment Patient needs continued PT services  PT Problem List Decreased activity tolerance;Decreased balance;Decreased knowledge of use of DME;Decreased safety awareness;Decreased knowledge of precautions;Cardiopulmonary status limiting activity       PT Treatment Interventions DME instruction;Gait training;Functional mobility training;Therapeutic activities;Therapeutic exercise;Balance training;Patient/family education    PT Goals (Current goals can be found in the Care Plan section)  Acute Rehab PT Goals Patient Stated Goal: to go home PT Goal Formulation: With patient Time For Goal Achievement: 11/24/20 Potential to Achieve Goals: Good    Frequency Min 3X/week   Barriers to discharge        Co-evaluation PT/OT/SLP Co-Evaluation/Treatment: Yes Reason for Co-Treatment: To address functional/ADL transfers PT goals addressed during session: Mobility/safety with mobility;Balance OT goals addressed during session: ADL's and self-care       AM-PAC PT "6 Clicks" Mobility  Outcome Measure Help needed turning from your back to your  side while in a flat bed without using bedrails?: A Little Help needed moving from lying on your back to sitting on the side of a flat bed without using bedrails?: A Little Help needed moving to and from a bed to a chair (including a wheelchair)?: A Little Help needed standing up from a chair using your arms (e.g., wheelchair or bedside chair)?: A Little Help needed to walk in hospital room?: A Little Help needed climbing 3-5 steps with a railing? : A Lot 6 Click Score: 17    End of Session Equipment Utilized During Treatment: Gait belt Activity Tolerance: Patient tolerated treatment well Patient left: in chair;with call bell/phone within reach;with chair alarm set Nurse Communication: Mobility status PT Visit Diagnosis: History of falling (Z91.81);Difficulty in walking, not elsewhere classified (R26.2)    Time: 5993-5701 PT Time Calculation (min) (ACUTE ONLY): 24 min   Charges:   PT Evaluation $PT Eval Low Complexity: 1 Low           Tori Nikodem Leadbetter PT, DPT 11/10/20, 12:49 PM

## 2020-11-10 NOTE — Progress Notes (Addendum)
PROGRESS NOTE  Crystal Osborne  QQI:[id number] DOB: [birth year] DOA: 11/07/2020 PCP: Colon Branch, MD   Brief Narrative: Crystal Osborne is a 87 y.o. female with a history of AAA, right breast CA, COPD, CVA, HTN, HLD, prediabetes, admission for pneumonia complicated by delirium earlier this month, subsequently discharged to [facility] 9/9, then to home 9/23, and fell at home with [facility] and subsequent left shoulder fracture and dislocation not able to be reduced in ED. Orthopedics is consulted, recommending reduction (closed vs. open) in OR. Cardiology was consulted for recent echo suggesting apical hypertrophic cardiomyopathy and interstitial thickening and small left pleural effusion on CXR, deemed the patient at mod-severe risk for perioperative complications. On 9/29 she was taken to the OR for successful closed reduction under regional nerve block.  Assessment & Plan: Principal Problem:   Fracture dislocation of left shoulder joint, closed, initial encounter Active Problems:   Essential hypertension   History of thyroidectomy   Hypertrophic cardiomyopathy (HCC)   Dysphagia   Dementia without behavioral disturbance (HCC)  Left shoulder fracture, dislocation: s/p closed reduction under interscalene regional nerve block in OR 9/29.  - Orthopedics recommends NWB LUE, keep in sling at all times including sleeping, removed only for bathing or dressing. Follow up with Dr. Tamera Punt in 2 weeks.   Falls at home: Typically gets around with a walker and is now NWB to LUE.  - Will require intensive PT/OT to improve functional independence. PT eval pending  Apical hypertrophic cardiomyopathy, frequent PVCs:  - Continue metoprolol succinate.  - Cardiology has evaluated the patient this admission. Has declined further investigations per cardiology.  Interstitial pulmonary edema, small left pleural effusion suggestive of acute HFrEF: Last echo LVEF appeared mildly decreased.  No true hypoxia documented, will wean  to room air and monitor. - BNP grossly elevated, though no respiratory compromise and CXR this AM shows no significant edema on my review. Stable small left pleural effusion is noted. Suspect attempt at diuresis would drop preload unnecessarily.  HTN: BP elevated, likely pain contributing. - Continue metoprolol, added hydralazine which we'll continue. - Heart healthy diet    Hyperkalemia: Resolved.  HLD:  - Continue statin  History of CVA:  - Continue ASA, statin    PAD: Underwent angiography 12/03/15 revealing high-grade calcified ostial bilateral iliac artery stenoses with three-vessel runoff. She was treated with diamondback orbital rotational atherectomy, PTA and covered stenting using Lifestream cover stents of both iliac ostia. - Continue ASA, statin  Dementia with history of acute hospital delirium:  - Delirium precautions.  - Would avoid haldol as this may be implicated in tremors previously.   History of thyroidectomy: Must not have been total as she's not on supplement and TSH is wnl.  DVT prophylaxis: SCDs Code Status: DNR Family Communication: None at bedside Disposition Plan:  Status is: Inpatient  Remains inpatient appropriate because:Ongoing diagnostic testing needed not appropriate for outpatient work up, Unsafe d/c plan, and Inpatient level of care appropriate due to severity of illness  Dispo: The patient is from: Home              Anticipated d/c is to: SNF most likely              Patient currently is not medically stable to d/c.  Consultants:  Orthopedics Cardiology  Procedures:  11/08/2020 Dr. Tamera Punt: Closed reduction left shoulder fracture dislocation under anesthesia  Antimicrobials: None   Subjective: No complaints today, sitting in chair watching TV. Denies pain at rest. No  dyspnea, no chest pain or leg swelling. Short burden of asymptomatic PVCs this morning which is expected based on outpatient records.  Objective: Vitals:   11/09/20  1416 11/09/20 1953 11/10/20 0426 11/10/20 0831  BP: (!) 145/67 (!) 129/58 (!) 144/58   Pulse: 80 72 68 75  Resp: 20 18 18    Temp: (!) 97.4 F (36.3 C) 98.1 F (36.7 C) 97.7 F (36.5 C)   TempSrc: Oral Oral    SpO2: 97% 99% 97% 96%  Weight:      Height:        Intake/Output Summary (Last 24 hours) at 11/10/2020 1145 Last data filed at 11/10/2020 1000 Gross per 24 hour  Intake 600 ml  Output 350 ml  Net 250 ml   Filed Weights   11/07/20 2124  Weight: 47 kg   Gen: Elderly female in no distress Pulm: Nonlabored breathing room air. Clear. CV: Regular rate and rhythm. No murmur, rub, or gallop. No JVD, no dependent edema. GI: Abdomen soft, non-tender, non-distended, with normoactive bowel sounds.  Ext: Warm, no deformities. Left shoulder tender to palpation, in sling. Distally NVI. Skin: No new rashes, lesions or ulcers on visualized skin. Neuro: Alert and disoriented but interactive, conversant. No focal neurological deficits. Psych: Judgement and insight appear impaired. Mood euthymic & affect congruent. Behavior is appropriate.    Data Reviewed: I have personally reviewed following labs and imaging studies  CBC: Recent Labs  Lab 11/07/20 1931 11/08/20 0716 11/09/20 0504  WBC 10.7* 11.4* 9.3  HGB 12.6 12.8 12.6  HCT 38.5 38.9 38.2  MCV 93.2 93.1 93.2  PLT 244 260 099   Basic Metabolic Panel: Recent Labs  Lab 11/07/20 1931 11/08/20 0716 11/09/20 0504  NA 139 139 137  K 5.5* 3.8 3.8  CL 105 108 104  CO2 27 26 23   GLUCOSE 105* 116* 111*  BUN 18 15 13   CREATININE 0.74 0.49 0.44  CALCIUM 8.1* 8.8* 8.3*   GFR: Estimated Creatinine Clearance: 36.8 mL/min (by C-G formula based on SCr of 0.44 mg/dL). Liver Function Tests: No results for input(s): AST, ALT, ALKPHOS, BILITOT, PROT, ALBUMIN in the last 168 hours. No results for input(s): LIPASE, AMYLASE in the last 168 hours. No results for input(s): AMMONIA in the last 168 hours. Coagulation Profile: No results  for input(s): INR, PROTIME in the last 168 hours. Cardiac Enzymes: No results for input(s): CKTOTAL, CKMB, CKMBINDEX, TROPONINI in the last 168 hours. BNP (last 3 results) No results for input(s): PROBNP in the last 8760 hours. HbA1C: No results for input(s): HGBA1C in the last 72 hours. CBG: Recent Labs  Lab 11/08/20 1449  GLUCAP 106*   Lipid Profile: No results for input(s): CHOL, HDL, LDLCALC, TRIG, CHOLHDL, LDLDIRECT in the last 72 hours. Thyroid Function Tests: No results for input(s): TSH, T4TOTAL, FREET4, T3FREE, THYROIDAB in the last 72 hours. Anemia Panel: No results for input(s): VITAMINB12, FOLATE, FERRITIN, TIBC, IRON, RETICCTPCT in the last 72 hours. Urine analysis:    Component Value Date/Time   COLORURINE YELLOW 10/10/2020 1645   APPEARANCEUR HAZY (A) 10/10/2020 1645   LABSPEC 1.010 10/10/2020 1645   PHURINE 5.0 10/10/2020 1645   GLUCOSEU NEGATIVE 10/10/2020 1645   GLUCOSEU NEGATIVE 08/21/2014 1027   HGBUR MODERATE (A) 10/10/2020 1645   HGBUR negative 09/25/2009 1034   BILIRUBINUR NEGATIVE 10/10/2020 1645   BILIRUBINUR Neg 05/26/2013 0827   KETONESUR NEGATIVE 10/10/2020 1645   PROTEINUR 100 (A) 10/10/2020 1645   UROBILINOGEN 0.2 08/21/2014 1027   NITRITE POSITIVE (  A) 10/10/2020 1645   LEUKOCYTESUR NEGATIVE 10/10/2020 1645   Recent Results (from the past 240 hour(s))  Resp Panel by RT-PCR (Flu A&B, Covid) Nasopharyngeal Swab     Status: None   Collection Time: 11/07/20  7:31 PM   Specimen: Nasopharyngeal Swab; Nasopharyngeal(NP) swabs in vial transport medium  Result Value Ref Range Status   SARS Coronavirus 2 by RT PCR NEGATIVE NEGATIVE Final    Comment: (NOTE) SARS-CoV-2 target nucleic acids are NOT DETECTED.  The SARS-CoV-2 RNA is generally detectable in upper respiratory specimens during the acute phase of infection. The lowest concentration of SARS-CoV-2 viral copies this assay can detect is 138 copies/mL. A negative result does not preclude  SARS-Cov-2 infection and should not be used as the sole basis for treatment or other patient management decisions. A negative result may occur with  improper specimen collection/handling, submission of specimen other than nasopharyngeal swab, presence of viral mutation(s) within the areas targeted by this assay, and inadequate number of viral copies(<138 copies/mL). A negative result must be combined with clinical observations, patient history, and epidemiological information. The expected result is Negative.  Fact Sheet for Patients:  EntrepreneurPulse.com.au  Fact Sheet for Healthcare Providers:  IncredibleEmployment.be  This test is no t yet approved or cleared by the [place] FDA and  has been authorized for detection and/or diagnosis of SARS-CoV-2 by FDA under an Emergency Use Authorization (EUA). This EUA will remain  in effect (meaning this test can be used) for the duration of the COVID-19 declaration under Section 564(b)(1) of the Act, 21 U.S.C.section 360bbb-3(b)(1), unless the authorization is terminated  or revoked sooner.       Influenza A by PCR NEGATIVE NEGATIVE Final   Influenza B by PCR NEGATIVE NEGATIVE Final    Comment: (NOTE) The Xpert Xpress SARS-CoV-2/FLU/RSV plus assay is intended as an aid in the diagnosis of influenza from Nasopharyngeal swab specimens and should not be used as a sole basis for treatment. Nasal washings and aspirates are unacceptable for Xpert Xpress SARS-CoV-2/FLU/RSV testing.  Fact Sheet for Patients: EntrepreneurPulse.com.au  Fact Sheet for Healthcare Providers: IncredibleEmployment.be  This test is not yet approved or cleared by the [place] FDA and has been authorized for detection and/or diagnosis of SARS-CoV-2 by FDA under an Emergency Use Authorization (EUA). This EUA will remain in effect (meaning this test can be used) for the duration of  the COVID-19 declaration under Section 564(b)(1) of the Act, 21 U.S.C. section 360bbb-3(b)(1), unless the authorization is terminated or revoked.  Performed at [facility], [place] [address], [place], [place] [address]   Surgical PCR screen     Status: None   Collection Time: 11/08/20  1:44 AM   Specimen: Nasal Mucosa; Nasal Swab  Result Value Ref Range Status   MRSA, PCR NEGATIVE NEGATIVE Final   Staphylococcus aureus NEGATIVE NEGATIVE Final    Comment: (NOTE) The Xpert SA Assay (FDA approved for NASAL specimens in patients 33 years of age and older), is one component of a comprehensive surveillance program. It is not intended to diagnose infection nor to guide or monitor treatment. Performed at [facility], [place] [address] [place], [place] [address]       Radiology Studies: DG Shoulder 1V Left  Result Date: 11/08/2020 CLINICAL DATA:  Left shoulder reduction EXAM: LEFT SHOULDER; DG C-ARM 1-60 MIN-NO REPORT COMPARISON:  11/07/2020 FINDINGS: Single image obtained via portable C-arm radiography shows interval reduction of left glenohumeral joint dislocation. Fracture deformity involving the greater  tuberosity of the proximal humerus noted. IMPRESSION: Interval reduction of left glenohumeral joint dislocation. Electronically Signed   By: Kerby Moors M.D.   On: 11/08/2020 16:27   DG CHEST PORT 1 VIEW  Result Date: 11/09/2020 CLINICAL DATA:  Hypoxia. EXAM: PORTABLE CHEST 1 VIEW COMPARISON:  Chest x-ray 11/07/2020. CT shoulder 11/07/2020. FINDINGS: Small left pleural effusion is unchanged. The right lung is clear. There is no pneumothorax. The heart is enlarged, unchanged. There are atherosclerotic calcifications of the aorta. Displaced greater tuberosity fracture is again noted in the left shoulder. IMPRESSION: Stable small left pleural effusion. Stable cardiomegaly. Electronically Signed   By: Ronney Asters M.D.   On: 11/09/2020 16:13   DG C-Arm  1-60 Min-No Report  Result Date: 11/08/2020 CLINICAL DATA:  Left shoulder reduction EXAM: LEFT SHOULDER; DG C-ARM 1-60 MIN-NO REPORT COMPARISON:  11/07/2020 FINDINGS: Single image obtained via portable C-arm radiography shows interval reduction of left glenohumeral joint dislocation. Fracture deformity involving the greater tuberosity of the proximal humerus noted. IMPRESSION: Interval reduction of left glenohumeral joint dislocation. Electronically Signed   By: Kerby Moors M.D.   On: 11/08/2020 16:27    Scheduled Meds:  aspirin EC  81 mg Oral QPM   atorvastatin  40 mg Oral Daily   docusate sodium  100 mg Oral BID   feeding supplement  1 Container Oral Q24H   feeding supplement  237 mL Oral BID BM   hydrALAZINE  25 mg Oral TID   metoprolol succinate  12.5 mg Oral Daily   multivitamin with minerals  1 tablet Oral Daily   mupirocin ointment  1 application Nasal BID   vitamin B-12  1,000 mcg Oral Daily   Continuous Infusions:   LOS: 3 days   Time spent: 25 minutes.  Patrecia Pour, MD [facility] [email or web address] 11/10/2020, 11:45 AM

## 2020-11-10 NOTE — Evaluation (Signed)
Occupational Therapy Evaluation Patient Details Name: Crystal Osborne MRN: [id number] DOB: 13-Jun-1931 Today's Date: 11/10/2020   History of Present Illness Crystal Osborne is an 87 y.o. female presents after fall with left proximal humeral fracture dislocation; s/p closed reduction L shoulder fracture dislocation under anesthesia 9/29. PMH: AAA, breast CA, COPD, CVA, diabetes, HTN, osteoporosis, shingles, TIA   Clinical Impression   Patient is a 87 year old female who was admitted for above. Patient was living at home with some caregiver support prior level. Currently, patient is mod A for ADLs with education to maintain UE restrictions with patient unable to use RW that she uses at baseline. Patient was noted to have decreased functional activity tolerance, decreased endurance, decreased standing balance, decreased ability to maintain ROM and WB recommendations, and decreased safety awareness impacting patients ability to complete ADLs.  Patient would continue to benefit from skilled OT services at this time while admitted and after d/c to address noted deficits in order to improve overall safety and independence in ADLs.       Recommendations for follow up therapy are one component of a multi-disciplinary discharge planning process, led by the attending physician.  Recommendations may be updated based on patient status, additional functional criteria and insurance authorization.   Follow Up Recommendations  SNF (v.s. HH with 24/7 care)    Equipment Recommendations  None recommended by OT    Recommendations for Other Services       Precautions / Restrictions Precautions Precautions: Fall Precaution Comments: 1 fall last week. NO ROM sling off for bathing/dressing only. Required Braces or Orthoses: Sling Restrictions Weight Bearing Restrictions: Yes LUE Weight Bearing: Non weight bearing      Mobility Bed Mobility Overal bed mobility: Needs Assistance Bed Mobility: Supine to Sit;Sit  to Supine     Supine to sit: Min guard;HOB elevated          Transfers                      Balance Overall balance assessment: Mild deficits observed, not formally tested                                         ADL either performed or assessed with clinical judgement   ADL Overall ADL's : Needs assistance/impaired Eating/Feeding: Set up;Sitting Eating/Feeding Details (indicate cue type and reason): patient was able to pick up coffee cup and take drinks with no spillage noted from breakfast that patient had just finished. Grooming: Wash/dry hands;Wash/dry face;Sitting;Oral care;Set up   Upper Body Bathing: Moderate assistance;Sitting   Lower Body Bathing: Moderate assistance;Sitting/lateral leans   Upper Body Dressing : Moderate assistance;Sitting   Lower Body Dressing: Moderate assistance;Sitting/lateral leans   Toilet Transfer: Minimal assistance Toilet Transfer Details (indicate cue type and reason): patient was min A with hand held assistance to transfer from edge of bed to recliner in room. patient declined to use restroom at this time and reported she did nto want to walk that far. patietn was educated on using [facility]. nurse made aware as well. Toileting- Clothing Manipulation and Hygiene: Moderate assistance;Sit to/from stand       Functional mobility during ADLs: Minimal assistance General ADL Comments: patient required min A for hand held assistance for mobilty in room with one LOB noted.     Vision Patient Visual Report: No change from baseline Vision Assessment?: No  apparent visual deficits     Perception     Praxis      Pertinent Vitals/Pain Pain Assessment: No/denies pain     Hand Dominance Right   Extremity/Trunk Assessment Upper Extremity Assessment Upper Extremity Assessment: LUE deficits/detail LUE Deficits / Details: patient is NO ROM per Ortho and NWB with sling in place at all times unless bathing/dressing   Lower  Extremity Assessment Lower Extremity Assessment: Defer to PT evaluation       Communication Communication Communication: No difficulties   Cognition Arousal/Alertness: Awake/alert Behavior During Therapy: WFL for tasks assessed/performed Overall Cognitive Status: Within Functional Limits for tasks assessed                                     General Comments       Exercises     Shoulder Instructions      Home Living Family/patient expects to be discharged to:: Private residence Living Arrangements: Alone Available Help at Discharge: Family;Available PRN/intermittently;Personal care attendant Type of Home: House Home Access: Ramped entrance     Home Layout: Two level;Able to live on main level with bedroom/bathroom     Bathroom Shower/Tub: Teacher, early years/pre: Standard     Home Equipment: Environmental consultant - 2 wheels;Cane - single point;Shower seat;Bedside commode   Additional Comments: son assists with lawn and grocery shopping      Prior Functioning/Environment Level of Independence: Independent with assistive device(s)        Comments: pt reports son does lawn care and grocery shopping. pt taking sponge bath, simple microwave cooking, household ambulator with RW. pt with aides 12-5pm 3-4 days/wk        OT Problem List: Decreased activity tolerance;Impaired balance (sitting and/or standing);Decreased safety awareness;Decreased knowledge of use of DME or AE;Cardiopulmonary status limiting activity      OT Treatment/Interventions: Self-care/ADL training;Energy conservation;DME and/or AE instruction;Therapeutic activities;Patient/family education    OT Goals(Current goals can be found in the care plan section) Acute Rehab OT Goals Patient Stated Goal: to go home OT Goal Formulation: With patient Time For Goal Achievement: 11/24/20 Potential to Achieve Goals: Good  OT Frequency: Min 2X/week   Barriers to D/C: Decreased caregiver  support          Co-evaluation PT/OT/SLP Co-Evaluation/Treatment: Yes Reason for Co-Treatment: To address functional/ADL transfers PT goals addressed during session: Mobility/safety with mobility OT goals addressed during session: ADL's and self-care      AM-PAC OT "6 Clicks" Daily Activity     Outcome Measure Help from another person eating meals?: A Little Help from another person taking care of personal grooming?: A Little Help from another person toileting, which includes using toliet, bedpan, or urinal?: A Lot Help from another person bathing (including washing, rinsing, drying)?: A Lot Help from another person to put on and taking off regular upper body clothing?: A Lot Help from another person to put on and taking off regular lower body clothing?: A Lot 6 Click Score: 14   End of Session Equipment Utilized During Treatment: Gait belt Nurse Communication: Mobility status;Other (comment) (sling placement and using BSC)  Activity Tolerance: Patient tolerated treatment well Patient left: in chair;with call bell/phone within reach;with chair alarm set  OT Visit Diagnosis: Unsteadiness on feet (R26.81);History of falling (Z91.81)                Time: 5956-3875 OT Time Calculation (min): 24 min Charges:  OT General Charges $OT Visit: 1 Visit OT Evaluation $OT Eval Low Complexity: 1 Low  Jackelyn Poling OTR/L, MS Acute Rehabilitation Department Office# [phone] Pager# [phone]   [place] 11/10/2020, 12:40 PM

## 2020-11-10 NOTE — Progress Notes (Signed)
RN informed by Cardiac Monitoring at 0900 that pt had a 3 beat run of PVCs/Vtach at 0855.  RN assessed pt, pt asymptomatic, eating breakfast in bed.  MD paged. Angie Fava, RN

## 2020-11-10 NOTE — Progress Notes (Signed)
O2 therapy discontinued per MD order.  Patient's SpO2 remained 96% on room air, tolerated well. Angie Fava, RN

## 2020-11-11 DIAGNOSIS — E890 Postprocedural hypothyroidism: Secondary | ICD-10-CM | POA: Diagnosis not present

## 2020-11-11 DIAGNOSIS — I422 Other hypertrophic cardiomyopathy: Secondary | ICD-10-CM | POA: Diagnosis not present

## 2020-11-11 DIAGNOSIS — I10 Essential (primary) hypertension: Secondary | ICD-10-CM | POA: Diagnosis not present

## 2020-11-11 DIAGNOSIS — S4292XA Fracture of left shoulder girdle, part unspecified, initial encounter for closed fracture: Secondary | ICD-10-CM | POA: Diagnosis not present

## 2020-11-11 NOTE — Progress Notes (Signed)
PROGRESS NOTE  KERIE BADGER  FWY:[id number] DOB: [birth year] DOA: 11/07/2020 PCP: Colon Branch, MD   Brief Narrative: Crystal Osborne is a 87 y.o. female with a history of AAA, right breast CA, COPD, CVA, HTN, HLD, prediabetes, admission for pneumonia complicated by delirium earlier this month, subsequently discharged to [facility] 9/9, then to home 9/23, and fell at home with [facility] and subsequent left shoulder fracture and dislocation not able to be reduced in ED. Orthopedics is consulted, recommending reduction (closed vs. open) in OR. Cardiology was consulted for recent echo suggesting apical hypertrophic cardiomyopathy and interstitial thickening and small left pleural effusion on CXR, deemed the patient at mod-severe risk for perioperative complications. On 9/29 she was taken to the OR for successful closed reduction under regional nerve block.  Assessment & Plan: Principal Problem:   Fracture dislocation of left shoulder joint, closed, initial encounter Active Problems:   Essential hypertension   History of thyroidectomy   Hypertrophic cardiomyopathy (HCC)   Dysphagia   Dementia without behavioral disturbance (HCC)  Left shoulder fracture, dislocation: s/p closed reduction under interscalene regional nerve block in OR 9/29.  - Orthopedics recommends NWB LUE, keep in sling at all times including sleeping, removed only for bathing or dressing. Follow up with Dr. Tamera Punt in 2 weeks.   Falls at home: Typically gets around with a walker and is now NWB to LUE.  - Will require intensive PT/OT to improve functional independence. PT and OT recommend SNF which will be pursued.   Apical hypertrophic cardiomyopathy, frequent PVCs: No sustained dysrhythmias on my review of telemetry.  - Continue metoprolol succinate.  - Cardiology has evaluated the patient this admission. Has declined further investigations per cardiology.  Interstitial pulmonary edema, small left pleural effusion suggestive of acute  HFrEF: Last echo LVEF appeared mildly decreased.  No true hypoxia documented, will wean to room air and monitor. - BNP grossly elevated, though no respiratory compromise and CXR showed no significant edema. Suspect attempt at diuresis would drop preload unnecessarily.  HTN: BP now down with improved pain control. - Continue metoprolol, added hydralazine which we'll now stop with soft BPs. If PVC remain frequent, would increase metoprolol dose.  - Heart healthy diet    Hyperkalemia: Resolved.  HLD:  - Continue statin  History of CVA:  - Continue ASA, statin    PAD: Underwent angiography 12/03/15 revealing high-grade calcified ostial bilateral iliac artery stenoses with three-vessel runoff. She was treated with diamondback orbital rotational atherectomy, PTA and covered stenting using Lifestream cover stents of both iliac ostia. - Continue ASA, statin  Dementia with history of acute hospital delirium:  - Delirium precautions.  - Would avoid haldol as this may be implicated in tremors previously.   History of thyroidectomy: Must not have been total as she's not on supplement and TSH is wnl.  DVT prophylaxis: SCDs Code Status: DNR Family Communication: Son at bedside Disposition Plan:  Status is: Inpatient  Remains inpatient appropriate because:Ongoing diagnostic testing needed not appropriate for outpatient work up, Unsafe d/c plan, and Inpatient level of care appropriate due to severity of illness  Dispo: The patient is from: Home              Anticipated d/c is to: SNF most likely. Pt does not have 24 hours supervision at home.              Patient currently is not medically stable to d/c.  Consultants:  Orthopedics Cardiology  Procedures:  11/08/2020 Dr. Tamera Punt:  Closed reduction left shoulder fracture dislocation under anesthesia  Antimicrobials: None   Subjective: Pain is much more well controlled, none at rest. No dyspnea, chest pain, or other complaints.    Objective: Vitals:   11/11/20 0446 11/11/20 1103 11/11/20 1214 11/11/20 1416  BP: (!) 127/54 127/71 128/88 (!) 120/55  Pulse: 63 69 76   Resp: 18 16 14    Temp: 97.7 F (36.5 C)  98.3 F (36.8 C)   TempSrc:   Oral   SpO2: 95% 97% 94%   Weight:      Height:        Intake/Output Summary (Last 24 hours) at 11/11/2020 1622 Last data filed at 11/11/2020 1310 Gross per 24 hour  Intake 670 ml  Output 700 ml  Net -30 ml   Filed Weights   11/07/20 2124  Weight: 47 kg   Gen: Elderly female in no distress Pulm: Nonlabored breathing room air. Clear. CV: Regular rate and rhythm. No murmur, rub, or gallop. No JVD, no dependent edema. GI: Abdomen soft, non-tender, non-distended, with normoactive bowel sounds.  Ext: Warm, no deformities, left arm in sling. Hand warm, dry, intact motor and sensory function Skin: No rashes, lesions or ulcers on visualized skin. Neuro: Alert and not oriented. No focal neurological deficits. Psych: Judgement and insight appear impaired. Mood euthymic & affect congruent. Behavior is appropriate.    Data Reviewed: I have personally reviewed following labs and imaging studies  CBC: Recent Labs  Lab 11/07/20 1931 11/08/20 0716 11/09/20 0504  WBC 10.7* 11.4* 9.3  HGB 12.6 12.8 12.6  HCT 38.5 38.9 38.2  MCV 93.2 93.1 93.2  PLT 244 260 654   Basic Metabolic Panel: Recent Labs  Lab 11/07/20 1931 11/08/20 0716 11/09/20 0504  NA 139 139 137  K 5.5* 3.8 3.8  CL 105 108 104  CO2 27 26 23   GLUCOSE 105* 116* 111*  BUN 18 15 13   CREATININE 0.74 0.49 0.44  CALCIUM 8.1* 8.8* 8.3*   GFR: Estimated Creatinine Clearance: 36.8 mL/min (by C-G formula based on SCr of 0.44 mg/dL). Liver Function Tests: No results for input(s): AST, ALT, ALKPHOS, BILITOT, PROT, ALBUMIN in the last 168 hours. No results for input(s): LIPASE, AMYLASE in the last 168 hours. No results for input(s): AMMONIA in the last 168 hours. Coagulation Profile: No results for input(s):  INR, PROTIME in the last 168 hours. Cardiac Enzymes: No results for input(s): CKTOTAL, CKMB, CKMBINDEX, TROPONINI in the last 168 hours. BNP (last 3 results) No results for input(s): PROBNP in the last 8760 hours. HbA1C: No results for input(s): HGBA1C in the last 72 hours. CBG: Recent Labs  Lab 11/08/20 1449  GLUCAP 106*   Lipid Profile: No results for input(s): CHOL, HDL, LDLCALC, TRIG, CHOLHDL, LDLDIRECT in the last 72 hours. Thyroid Function Tests: No results for input(s): TSH, T4TOTAL, FREET4, T3FREE, THYROIDAB in the last 72 hours. Anemia Panel: No results for input(s): VITAMINB12, FOLATE, FERRITIN, TIBC, IRON, RETICCTPCT in the last 72 hours. Urine analysis:    Component Value Date/Time   COLORURINE YELLOW 10/10/2020 1645   APPEARANCEUR HAZY (A) 10/10/2020 1645   LABSPEC 1.010 10/10/2020 1645   PHURINE 5.0 10/10/2020 1645   GLUCOSEU NEGATIVE 10/10/2020 1645   GLUCOSEU NEGATIVE 08/21/2014 1027   HGBUR MODERATE (A) 10/10/2020 1645   HGBUR negative 09/25/2009 1034   BILIRUBINUR NEGATIVE 10/10/2020 1645   BILIRUBINUR Neg 05/26/2013 0827   KETONESUR NEGATIVE 10/10/2020 1645   PROTEINUR 100 (A) 10/10/2020 1645   UROBILINOGEN 0.2 08/21/2014  1027   NITRITE POSITIVE (A) 10/10/2020 1645   LEUKOCYTESUR NEGATIVE 10/10/2020 1645   Recent Results (from the past 240 hour(s))  Resp Panel by RT-PCR (Flu A&B, Covid) Nasopharyngeal Swab     Status: None   Collection Time: 11/07/20  7:31 PM   Specimen: Nasopharyngeal Swab; Nasopharyngeal(NP) swabs in vial transport medium  Result Value Ref Range Status   SARS Coronavirus 2 by RT PCR NEGATIVE NEGATIVE Final    Comment: (NOTE) SARS-CoV-2 target nucleic acids are NOT DETECTED.  The SARS-CoV-2 RNA is generally detectable in upper respiratory specimens during the acute phase of infection. The lowest concentration of SARS-CoV-2 viral copies this assay can detect is 138 copies/mL. A negative result does not preclude  SARS-Cov-2 infection and should not be used as the sole basis for treatment or other patient management decisions. A negative result may occur with  improper specimen collection/handling, submission of specimen other than nasopharyngeal swab, presence of viral mutation(s) within the areas targeted by this assay, and inadequate number of viral copies(<138 copies/mL). A negative result must be combined with clinical observations, patient history, and epidemiological information. The expected result is Negative.  Fact Sheet for Patients:  EntrepreneurPulse.com.au  Fact Sheet for Healthcare Providers:  IncredibleEmployment.be  This test is no t yet approved or cleared by the [place] FDA and  has been authorized for detection and/or diagnosis of SARS-CoV-2 by FDA under an Emergency Use Authorization (EUA). This EUA will remain  in effect (meaning this test can be used) for the duration of the COVID-19 declaration under Section 564(b)(1) of the Act, 21 U.S.C.section 360bbb-3(b)(1), unless the authorization is terminated  or revoked sooner.       Influenza A by PCR NEGATIVE NEGATIVE Final   Influenza B by PCR NEGATIVE NEGATIVE Final    Comment: (NOTE) The Xpert Xpress SARS-CoV-2/FLU/RSV plus assay is intended as an aid in the diagnosis of influenza from Nasopharyngeal swab specimens and should not be used as a sole basis for treatment. Nasal washings and aspirates are unacceptable for Xpert Xpress SARS-CoV-2/FLU/RSV testing.  Fact Sheet for Patients: EntrepreneurPulse.com.au  Fact Sheet for Healthcare Providers: IncredibleEmployment.be  This test is not yet approved or cleared by the [place] FDA and has been authorized for detection and/or diagnosis of SARS-CoV-2 by FDA under an Emergency Use Authorization (EUA). This EUA will remain in effect (meaning this test can be used) for the duration of  the COVID-19 declaration under Section 564(b)(1) of the Act, 21 U.S.C. section 360bbb-3(b)(1), unless the authorization is terminated or revoked.  Performed at [facility], [place] [address] [place], [place] [address]   Surgical PCR screen     Status: None   Collection Time: 11/08/20  1:44 AM   Specimen: Nasal Mucosa; Nasal Swab  Result Value Ref Range Status   MRSA, PCR NEGATIVE NEGATIVE Final   Staphylococcus aureus NEGATIVE NEGATIVE Final    Comment: (NOTE) The Xpert SA Assay (FDA approved for NASAL specimens in patients 8 years of age and older), is one component of a comprehensive surveillance program. It is not intended to diagnose infection nor to guide or monitor treatment. Performed at [facility], [place] [address] [place], [place] [address]       Radiology Studies: No results found.  Scheduled Meds:  aspirin EC  81 mg Oral QPM   atorvastatin  40 mg Oral Daily   docusate sodium  100 mg Oral BID   feeding supplement  1 Container Oral Q24H  feeding supplement  237 mL Oral BID BM   hydrALAZINE  25 mg Oral TID   metoprolol succinate  12.5 mg Oral Daily   multivitamin with minerals  1 tablet Oral Daily   mupirocin ointment  1 application Nasal BID   psyllium  1 packet Oral Daily   vitamin B-12  1,000 mcg Oral Daily   Continuous Infusions:   LOS: 4 days   Time spent: 25 minutes.  Patrecia Pour, MD [facility] [email or web address] 11/11/2020, 4:22 PM

## 2020-11-12 DIAGNOSIS — I10 Essential (primary) hypertension: Secondary | ICD-10-CM | POA: Diagnosis not present

## 2020-11-12 DIAGNOSIS — E890 Postprocedural hypothyroidism: Secondary | ICD-10-CM | POA: Diagnosis not present

## 2020-11-12 DIAGNOSIS — S4292XA Fracture of left shoulder girdle, part unspecified, initial encounter for closed fracture: Secondary | ICD-10-CM | POA: Diagnosis not present

## 2020-11-12 DIAGNOSIS — I422 Other hypertrophic cardiomyopathy: Secondary | ICD-10-CM | POA: Diagnosis not present

## 2020-11-12 LAB — GLUCOSE, CAPILLARY: Glucose-Capillary: 88 mg/dL (ref 70–99)

## 2020-11-12 NOTE — NC FL2 (Signed)
[place] LEVEL OF CARE SCREENING TOOL     IDENTIFICATION  Patient Name: Crystal Osborne Birthdate: 1932/01/03 Sex: female Admission Date (Current Location): 11/07/2020  [place] and [place] Number:  Kathleen Argue [id number] [place] and Address:  [facility],  [address] [place], [place]      Provider Number: [id number]  Attending Physician Name and Address:  Patrecia Pour, MD  Relative Name and Phone Number:  Jaasia, Viglione  [phone]    Current Level of Care: Hospital Recommended Level of Care: [place] Prior Approval Number:    Date Approved/Denied:   PASRR Number: [id number]  Discharge Plan: SNF    Current Diagnoses: Patient Active Problem List   Diagnosis Date Noted   Dysphagia 11/07/2020   Fracture dislocation of left shoulder joint, closed, initial encounter 11/07/2020   Dementia without behavioral disturbance ([place]) 11/07/2020   CAP (community acquired pneumonia) 96/28/3662   Acute metabolic encephalopathy 94/76/5465   Hypertrophic cardiomyopathy ([place]) 10/10/2020   Shingles 07/29/2017   Syncope 07/25/2016   Claudication ([place]) 12/03/2015   PCP NOTES >>>>> 01/10/2015   COPD (chronic obstructive pulmonary disease) ([place]) 01/25/2014   B12 deficiency 01/25/2014   Anxiety and depression 07/26/2012   Insomnia 08/12/2011   Annual physical exam 05/29/2010   Hyperglycemia 05/29/2010   Diarrhea, chronic , on-off 05/29/2010   TOBACCO ABUSE 11/15/2009   Disorder of bladder 09/25/2009   SHOULDER PAIN 04/10/2009   PERSONAL HX BREAST CANCER 12/12/2008   Carotid artery disease ([place]) 11/28/2008   PVD (peripheral vascular disease) ([place]) 04/18/2008   BLIND LOOP SYNDROME 11/09/2007   DIVERTICULOSIS, COLON 10/14/2007   COLONIC POLYPS 08/03/2006   Essential hypertension 08/03/2006   CVA 08/03/2006   OSTEOPOROSIS NOS 08/03/2006   History of thyroidectomy 08/03/2006   Hyperlipidemia 03/05/2006   Abdominal aortic aneurysm  03/05/2006    Orientation RESPIRATION BLADDER Height & Weight     Self, Place  Normal External catheter Weight: 47 kg Height:  5\' 5"  (165.1 cm)  BEHAVIORAL SYMPTOMS/MOOD NEUROLOGICAL BOWEL NUTRITION STATUS      Continent Diet (Heart Healthy)  AMBULATORY STATUS COMMUNICATION OF NEEDS Skin   Limited Assist Verbally Bruising                       Personal Care Assistance Level of Assistance  Bathing, Dressing Bathing Assistance: Maximum assistance Feeding assistance: Limited assistance Dressing Assistance: Maximum assistance     Functional Limitations Info  Hearing, Sight, Speech Sight Info: Adequate Hearing Info: Adequate Speech Info: Adequate    SPECIAL CARE FACTORS FREQUENCY  PT (By licensed PT), OT (By licensed OT)     PT Frequency:  (5x week) OT Frequency:  (5x week)            Contractures Contractures Info: Not present    Additional Factors Info  Code Status, Allergies Code Status Info:  (DNR) Allergies Info:  (Septra (Sulfamethoxazole-trimethoprim), Ciprofloxacin Hcl, Codeine)           Current Medications (11/12/2020):  This is the current hospital active medication list Current Facility-Administered Medications  Medication Dose Route Frequency Provider Last Rate Last Admin   acetaminophen (TYLENOL) tablet 650 mg  650 mg Oral Q6H PRN Porterfield, Amber, PA-C       Or   acetaminophen (TYLENOL) suppository 650 mg  650 mg Rectal Q6H PRN Porterfield, Amber, PA-C       acetaminophen (TYLENOL) tablet 1,000 mg  1,000 mg Oral BID PRN Porterfield, [employer], PA-C  1,000 mg at 11/12/20 0012   albuterol (PROVENTIL) (2.5 MG/3ML) 0.083% nebulizer solution 3 mL  3 mL Inhalation Q6H PRN Porterfield, Amber, PA-C       aspirin EC tablet 81 mg  81 mg Oral QPM Porterfield, Amber, PA-C   81 mg at 11/11/20 1819   atorvastatin (LIPITOR) tablet 40 mg  40 mg Oral Daily Porterfield, Amber, PA-C   40 mg at 11/11/20 2101   docusate sodium (COLACE) capsule 100 mg  100 mg Oral  BID Porterfield, Amber, PA-C   100 mg at 11/12/20 1024   feeding supplement (BOOST / RESOURCE BREEZE) liquid 1 Container  1 Container Oral Q24H Porterfield, Amber, PA-C   1 Container at 11/10/20 1531   feeding supplement (ENSURE SURGERY) liquid 237 mL  237 mL Oral BID BM Porterfield, Amber, PA-C   237 mL at 11/09/20 1027   hydrALAZINE (APRESOLINE) injection 10-20 mg  10-20 mg Intravenous Q4H PRN Porterfield, Amber, PA-C   20 mg at 11/07/20 2242   HYDROmorphone (DILAUDID) injection 0.5-1 mg  0.5-1 mg Intravenous Q2H PRN Porterfield, Amber, PA-C   1 mg at 11/07/20 2243   menthol-cetylpyridinium (CEPACOL) lozenge 3 mg  1 lozenge Oral PRN Porterfield, Amber, PA-C       Or   phenol (CHLORASEPTIC) mouth spray 1 spray  1 spray Mouth/Throat PRN Porterfield, Amber, PA-C       metoprolol succinate (TOPROL-XL) 24 hr tablet 12.5 mg  12.5 mg Oral Daily Porterfield, Amber, PA-C   12.5 mg at 11/12/20 1024   multivitamin with minerals tablet 1 tablet  1 tablet Oral Daily Porterfield, Amber, PA-C   1 tablet at 11/12/20 1025   ondansetron (ZOFRAN) tablet 4 mg  4 mg Oral Q6H PRN Porterfield, Amber, PA-C       Or   ondansetron (ZOFRAN) injection 4 mg  4 mg Intravenous Q6H PRN Porterfield, Amber, PA-C   4 mg at 11/07/20 2303   ondansetron (ZOFRAN) tablet 4 mg  4 mg Oral Q6H PRN Porterfield, Amber, PA-C       Or   ondansetron (ZOFRAN) injection 4 mg  4 mg Intravenous Q6H PRN Porterfield, Amber, PA-C       polyvinyl alcohol (LIQUIFILM TEARS) 1.4 % ophthalmic solution 1 drop  1 drop Both Eyes PRN Patrecia Pour, MD       psyllium (HYDROCIL/METAMUCIL) 1 packet  1 packet Oral Daily Patrecia Pour, MD       vitamin B-12 (CYANOCOBALAMIN) tablet 1,000 mcg  1,000 mcg Oral Daily Porterfield, Amber, PA-C   1,000 mcg at 11/12/20 1025     Discharge Medications: Please see discharge summary for a list of discharge medications.  Relevant Imaging Results:  Relevant Lab Results:   Additional Information SS#[id number]:[employer]  x2;no booster  Dessa Phi, RN

## 2020-11-12 NOTE — Progress Notes (Signed)
PROGRESS NOTE  Crystal Osborne  QMV:[id number] DOB: 1931-08-20 DOA: 11/07/2020 PCP: Colon Branch, MD   Brief Narrative: Crystal Osborne is a 87 y.o. female with a history of AAA, right breast CA, COPD, CVA, HTN, HLD, prediabetes, admission for pneumonia complicated by delirium earlier this month, subsequently discharged to [facility] 9/9, then to home 9/23, and fell at home with [facility] and subsequent left shoulder fracture and dislocation not able to be reduced in ED. Orthopedics is consulted, recommending reduction (closed vs. open) in OR. Cardiology was consulted for recent echo suggesting apical hypertrophic cardiomyopathy and interstitial thickening and small left pleural effusion on CXR, deemed the patient at mod-severe risk for perioperative complications. On 9/29 she was taken to the OR for successful closed reduction under regional nerve block.  Assessment & Plan: Principal Problem:   Fracture dislocation of left shoulder joint, closed, initial encounter Active Problems:   Essential hypertension   History of thyroidectomy   Hypertrophic cardiomyopathy (HCC)   Dysphagia   Dementia without behavioral disturbance (HCC)  Left shoulder fracture, dislocation: s/p closed reduction under interscalene regional nerve block in OR 9/29.  - Orthopedics recommends NWB LUE, keep in sling at all times including sleeping, removed only for bathing or dressing. Follow up with Dr. Tamera Punt in 2 weeks.   Falls at home: Typically gets around with a walker and is now NWB to LUE.  - Will require intensive PT/OT to improve functional independence. PT and OT recommend SNF which is being pursued.   Apical hypertrophic cardiomyopathy, frequent PVCs: No sustained dysrhythmias on my review of telemetry.  - Continue metoprolol succinate.  - Cardiology has evaluated the patient this admission. Has declined further investigations per cardiology.  Interstitial pulmonary edema, small left pleural effusion suggestive of acute  HFrEF: Last echo LVEF appeared mildly decreased.  No true hypoxia documented, will wean to room air and monitor. - BNP grossly elevated, though no respiratory compromise and CXR showed no significant edema. Suspect attempt at diuresis would drop preload unnecessarily.  HTN: BP now down with improved pain control. - Continue metoprolol If PVC remain frequent, would increase metoprolol dose. Vital signs currently stable and normal. - Heart healthy diet    Hyperkalemia: Resolved.  HLD:  - Continue statin  History of CVA:  - Continue ASA, statin    PAD: Underwent angiography 12/03/15 revealing high-grade calcified ostial bilateral iliac artery stenoses with three-vessel runoff. She was treated with diamondback orbital rotational atherectomy, PTA and covered stenting using Lifestream cover stents of both iliac ostia. - Continue ASA, statin  Dementia with history of acute hospital delirium:  - Delirium precautions.  - Would avoid haldol as this may be implicated in tremors previously.   History of thyroidectomy: Must not have been total as she's not on supplement and TSH is wnl.  DVT prophylaxis: SCDs Code Status: DNR Family Communication: None at bedside Disposition Plan:  Status is: Inpatient  Remains inpatient appropriate because:Ongoing diagnostic testing needed not appropriate for outpatient work up, Unsafe d/c plan, and Inpatient level of care appropriate due to severity of illness  Dispo: The patient is from: Home              Anticipated d/c is to: SNF               Patient currently is medically stable to d/c.  Consultants:  Orthopedics Cardiology  Procedures:  11/08/2020 Dr. Tamera Punt: Closed reduction left shoulder fracture dislocation under anesthesia  Antimicrobials: None   Subjective: Worked  with PT. Pain is controlled in left shoulder, getting familiar with cane. No new complaints.   Objective: Vitals:   11/12/20 0618 11/12/20 0619 11/12/20 1024 11/12/20 1103   BP: (!) 144/70  126/74 112/66  Pulse: 69  72 66  Resp: 16   16  Temp:  97.7 F (36.5 C)  98.2 F (36.8 C)  TempSrc:  Oral  Oral  SpO2: 96%   97%  Weight:      Height:        Intake/Output Summary (Last 24 hours) at 11/12/2020 1641 Last data filed at 11/12/2020 5726 Gross per 24 hour  Intake 240 ml  Output 500 ml  Net -260 ml   Filed Weights   11/07/20 2124  Weight: 47 kg   Gen: 87 y.o. female in no distress Pulm: Nonlabored breathing room air. Clear. CV: Regular rate and rhythm. No murmur, rub, or gallop. No JVD, no dependent edema. GI: Abdomen soft, non-tender, non-distended, with normoactive bowel sounds.  Ext: Warm, no deformities. L arm in sling. Skin: No new rashes, lesions or ulcers on visualized skin. Neuro: Alert and disoriented. No focal neurological deficits. Psych: Judgement and insight appear impaired. Mood euthymic & affect congruent. Behavior is appropriate.    Data Reviewed: I have personally reviewed following labs and imaging studies  CBC: Recent Labs  Lab 11/07/20 1931 11/08/20 0716 11/09/20 0504  WBC 10.7* 11.4* 9.3  HGB 12.6 12.8 12.6  HCT 38.5 38.9 38.2  MCV 93.2 93.1 93.2  PLT 244 260 203   Basic Metabolic Panel: Recent Labs  Lab 11/07/20 1931 11/08/20 0716 11/09/20 0504  NA 139 139 137  K 5.5* 3.8 3.8  CL 105 108 104  CO2 27 26 23   GLUCOSE 105* 116* 111*  BUN 18 15 13   CREATININE 0.74 0.49 0.44  CALCIUM 8.1* 8.8* 8.3*   GFR: Estimated Creatinine Clearance: 36.8 mL/min (by C-G formula based on SCr of 0.44 mg/dL). Liver Function Tests: No results for input(s): AST, ALT, ALKPHOS, BILITOT, PROT, ALBUMIN in the last 168 hours. No results for input(s): LIPASE, AMYLASE in the last 168 hours. No results for input(s): AMMONIA in the last 168 hours. Coagulation Profile: No results for input(s): INR, PROTIME in the last 168 hours. Cardiac Enzymes: No results for input(s): CKTOTAL, CKMB, CKMBINDEX, TROPONINI in the last 168 hours. BNP  (last 3 results) No results for input(s): PROBNP in the last 8760 hours. HbA1C: No results for input(s): HGBA1C in the last 72 hours. CBG: Recent Labs  Lab 11/08/20 1449 11/12/20 0724  GLUCAP 106* 88   Lipid Profile: No results for input(s): CHOL, HDL, LDLCALC, TRIG, CHOLHDL, LDLDIRECT in the last 72 hours. Thyroid Function Tests: No results for input(s): TSH, T4TOTAL, FREET4, T3FREE, THYROIDAB in the last 72 hours. Anemia Panel: No results for input(s): VITAMINB12, FOLATE, FERRITIN, TIBC, IRON, RETICCTPCT in the last 72 hours. Urine analysis:    Component Value Date/Time   COLORURINE YELLOW 10/10/2020 1645   APPEARANCEUR HAZY (A) 10/10/2020 1645   LABSPEC 1.010 10/10/2020 1645   PHURINE 5.0 10/10/2020 1645   GLUCOSEU NEGATIVE 10/10/2020 1645   GLUCOSEU NEGATIVE 08/21/2014 1027   HGBUR MODERATE (A) 10/10/2020 1645   HGBUR negative 09/25/2009 1034   BILIRUBINUR NEGATIVE 10/10/2020 1645   BILIRUBINUR Neg 05/26/2013 0827   KETONESUR NEGATIVE 10/10/2020 1645   PROTEINUR 100 (A) 10/10/2020 1645   UROBILINOGEN 0.2 08/21/2014 1027   NITRITE POSITIVE (A) 10/10/2020 1645   LEUKOCYTESUR NEGATIVE 10/10/2020 1645   Recent Results (from the  past 240 hour(s))  Resp Panel by RT-PCR (Flu A&B, Covid) Nasopharyngeal Swab     Status: None   Collection Time: 11/07/20  7:31 PM   Specimen: Nasopharyngeal Swab; Nasopharyngeal(NP) swabs in vial transport medium  Result Value Ref Range Status   SARS Coronavirus 2 by RT PCR NEGATIVE NEGATIVE Final    Comment: (NOTE) SARS-CoV-2 target nucleic acids are NOT DETECTED.  The SARS-CoV-2 RNA is generally detectable in upper respiratory specimens during the acute phase of infection. The lowest concentration of SARS-CoV-2 viral copies this assay can detect is 138 copies/mL. A negative result does not preclude SARS-Cov-2 infection and should not be used as the sole basis for treatment or other patient management decisions. A negative result may occur  with  improper specimen collection/handling, submission of specimen other than nasopharyngeal swab, presence of viral mutation(s) within the areas targeted by this assay, and inadequate number of viral copies(<138 copies/mL). A negative result must be combined with clinical observations, patient history, and epidemiological information. The expected result is Negative.  Fact Sheet for Patients:  EntrepreneurPulse.com.au  Fact Sheet for Healthcare Providers:  IncredibleEmployment.be  This test is no t yet approved or cleared by the [place] FDA and  has been authorized for detection and/or diagnosis of SARS-CoV-2 by FDA under an Emergency Use Authorization (EUA). This EUA will remain  in effect (meaning this test can be used) for the duration of the COVID-19 declaration under Section 564(b)(1) of the Act, 21 U.S.C.section 360bbb-3(b)(1), unless the authorization is terminated  or revoked sooner.       Influenza A by PCR NEGATIVE NEGATIVE Final   Influenza B by PCR NEGATIVE NEGATIVE Final    Comment: (NOTE) The Xpert Xpress SARS-CoV-2/FLU/RSV plus assay is intended as an aid in the diagnosis of influenza from Nasopharyngeal swab specimens and should not be used as a sole basis for treatment. Nasal washings and aspirates are unacceptable for Xpert Xpress SARS-CoV-2/FLU/RSV testing.  Fact Sheet for Patients: EntrepreneurPulse.com.au  Fact Sheet for Healthcare Providers: IncredibleEmployment.be  This test is not yet approved or cleared by the [place] FDA and has been authorized for detection and/or diagnosis of SARS-CoV-2 by FDA under an Emergency Use Authorization (EUA). This EUA will remain in effect (meaning this test can be used) for the duration of the COVID-19 declaration under Section 564(b)(1) of the Act, 21 U.S.C. section 360bbb-3(b)(1), unless the authorization is terminated  or revoked.  Performed at [facility], [place] [address] [place], [place] [address]   Surgical PCR screen     Status: None   Collection Time: 11/08/20  1:44 AM   Specimen: Nasal Mucosa; Nasal Swab  Result Value Ref Range Status   MRSA, PCR NEGATIVE NEGATIVE Final   Staphylococcus aureus NEGATIVE NEGATIVE Final    Comment: (NOTE) The Xpert SA Assay (FDA approved for NASAL specimens in patients 56 years of age and older), is one component of a comprehensive surveillance program. It is not intended to diagnose infection nor to guide or monitor treatment. Performed at [facility], [place] [address], [place],  [address]       Radiology Studies: No results found.  Scheduled Meds:  aspirin EC  81 mg Oral QPM   atorvastatin  40 mg Oral Daily   docusate sodium  100 mg Oral BID   feeding supplement  1 Container Oral Q24H   feeding supplement  237 mL Oral BID BM   metoprolol succinate  12.5 mg Oral Daily  multivitamin with minerals  1 tablet Oral Daily   psyllium  1 packet Oral Daily   vitamin B-12  1,000 mcg Oral Daily   Continuous Infusions:   LOS: 5 days   Time spent: 25 minutes.  Patrecia Pour, MD [facility] [email or web address] 11/12/2020, 4:41 PM

## 2020-11-12 NOTE — Progress Notes (Signed)
Physical Therapy Treatment Patient Details Name: Crystal Osborne MRN: [id number] DOB: 1931-08-27 Today's Date: 11/12/2020   History of Present Illness Crystal Osborne is an 87 y.o. female presents after fall with left proximal humeral fracture dislocation; s/p closed reduction L shoulder fracture dislocation under anesthesia 9/29. PMH: AAA, breast CA, COPD, CVA, diabetes, HTN, osteoporosis, shingles, TIA    PT Comments    Pt assisted with ambulating to/from bathroom and then ambulated in hallway with [facility] after rest break.  Continue to recommend SNF if pt does not have 24/7 assist at home.    Recommendations for follow up therapy are one component of a multi-disciplinary discharge planning process, led by the attending physician.  Recommendations may be updated based on patient status, additional functional criteria and insurance authorization.  Follow Up Recommendations  Supervision/Assistance - 24 hour;SNF     Equipment Recommendations  None recommended by PT    Recommendations for Other Services       Precautions / Restrictions Precautions Precautions: Fall Precaution Comments: 1 fall last week. NO ROM sling off for bathing/dressing only. Required Braces or Orthoses: Sling Restrictions Weight Bearing Restrictions: Yes LUE Weight Bearing: Non weight bearing     Mobility  Bed Mobility               General bed mobility comments: pt in recliner    Transfers Overall transfer level: Needs assistance Equipment used: 1 person hand held assist Transfers: Sit to/from Stand Sit to Stand: Min assist         General transfer comment: assist to rise and steady  Ambulation/Gait Ambulation/Gait assistance: Min guard Gait Distance (Feet): 40 Feet Assistive device: Straight cane Gait Pattern/deviations: Step-through pattern;Decreased stride length     General Gait Details: pt first assisted to bathroom with [facility]HA and back to recliner for rest break (pt had BM); pt then  ambulated in hallway with [facility]   Stairs             Wheelchair Mobility    Modified Rankin (Stroke Patients Only)       Balance Overall balance assessment: Mild deficits observed, not formally tested         Standing balance support: No upper extremity supported Standing balance-Leahy Scale: Fair Standing balance comment: static fair, does not tolerate any challenges                            Cognition Arousal/Alertness: Awake/alert Behavior During Therapy: WFL for tasks assessed/performed Overall Cognitive Status: Within Functional Limits for tasks assessed                                        Exercises      General Comments        Pertinent Vitals/Pain Pain Assessment: No/denies pain    Home Living                      Prior Function            PT Goals (current goals can now be found in the care plan section) Progress towards PT goals: Progressing toward goals    Frequency    Min 3X/week      PT Plan Current plan remains appropriate    Co-evaluation              AM-PAC PT "  6 Clicks" Mobility   Outcome Measure  Help needed turning from your back to your side while in a flat bed without using bedrails?: A Little Help needed moving from lying on your back to sitting on the side of a flat bed without using bedrails?: A Little Help needed moving to and from a bed to a chair (including a wheelchair)?: A Little Help needed standing up from a chair using your arms (e.g., wheelchair or bedside chair)?: A Little Help needed to walk in hospital room?: A Little Help needed climbing 3-5 steps with a railing? : A Lot 6 Click Score: 17    End of Session Equipment Utilized During Treatment: Gait belt Activity Tolerance: Patient tolerated treatment well Patient left: in chair;with call bell/phone within reach;with chair alarm set Nurse Communication: Mobility status PT Visit Diagnosis: History of falling  (Z91.81);Difficulty in walking, not elsewhere classified (R26.2)     Time: 7616-0737 PT Time Calculation (min) (ACUTE ONLY): 21 min  Charges:  $Gait Training: 8-22 mins                    Jannette Spanner PT, DPT Acute Rehabilitation Services Pager: [phone] Office: [place] 11/12/2020, 12:39 PM

## 2020-11-12 NOTE — TOC Progression Note (Signed)
Transition of Care [facility]) - Progression Note    Patient Details  Name: AHANA NAJERA MRN: [id number] Date of Birth: 1931-05-08  Transition of Care [facility]) CM/SW Contact  Kaydence Menard, Juliann Pulse, RN Phone Number: 11/12/2020, 1:52 PM  Clinical Narrative:  Rogelia Rohrer for [facility]ota health auth [phone] [facility] can accept tomorrow if auth. See prior notes.     Expected Discharge Plan: Skilled Nursing Facility Barriers to Discharge: Insurance Authorization  Expected Discharge Plan and Services Expected Discharge Plan: [place]   Discharge Planning Services: CM Consult   Living arrangements for the past 2 months: Single Family Home Expected Discharge Date: 11/12/20                                     Social Determinants of Health (SDOH) Interventions    Readmission Risk Interventions No flowsheet data found.

## 2020-11-12 NOTE — TOC Progression Note (Addendum)
Transition of Care [facility]) - Progression Note    Patient Details  Name: Crystal Osborne MRN: [id number] Date of Birth: 07-27-1931  Transition of Care [name]) CM/SW Contact  Nanda Bittick, Juliann Pulse, RN Phone Number: 11/12/2020, 12:11 PM  Clinical Narrative:  Faxed out await bed offers-spoke to son [name] prefers [facility]-await response. Will need covid & auth.DNR to be signed. 12:40p-[facility] able to accept will start auth.    Expected Discharge Plan: [place] Barriers to Discharge: Continued Medical Work up  Expected Discharge Plan and Services Expected Discharge Plan: [place]   Discharge Planning Services: CM Consult   Living arrangements for the past 2 months: Single Family Home Expected Discharge Date: 11/12/20                                     Social Determinants of Health (SDOH) Interventions    Readmission Risk Interventions No flowsheet data found.

## 2020-11-13 ENCOUNTER — Telehealth: Payer: Self-pay

## 2020-11-13 DIAGNOSIS — J181 Lobar pneumonia, unspecified organism: Secondary | ICD-10-CM

## 2020-11-13 DIAGNOSIS — Z8673 Personal history of transient ischemic attack (TIA), and cerebral infarction without residual deficits: Secondary | ICD-10-CM

## 2020-11-13 DIAGNOSIS — G9341 Metabolic encephalopathy: Secondary | ICD-10-CM

## 2020-11-13 DIAGNOSIS — F17210 Nicotine dependence, cigarettes, uncomplicated: Secondary | ICD-10-CM

## 2020-11-13 DIAGNOSIS — I429 Cardiomyopathy, unspecified: Secondary | ICD-10-CM

## 2020-11-13 DIAGNOSIS — J9601 Acute respiratory failure with hypoxia: Secondary | ICD-10-CM

## 2020-11-13 DIAGNOSIS — Z9011 Acquired absence of right breast and nipple: Secondary | ICD-10-CM

## 2020-11-13 DIAGNOSIS — M810 Age-related osteoporosis without current pathological fracture: Secondary | ICD-10-CM

## 2020-11-13 DIAGNOSIS — Z853 Personal history of malignant neoplasm of breast: Secondary | ICD-10-CM

## 2020-11-13 DIAGNOSIS — E785 Hyperlipidemia, unspecified: Secondary | ICD-10-CM

## 2020-11-13 DIAGNOSIS — R1319 Other dysphagia: Secondary | ICD-10-CM | POA: Diagnosis not present

## 2020-11-13 DIAGNOSIS — Z7982 Long term (current) use of aspirin: Secondary | ICD-10-CM

## 2020-11-13 DIAGNOSIS — J440 Chronic obstructive pulmonary disease with acute lower respiratory infection: Secondary | ICD-10-CM

## 2020-11-13 DIAGNOSIS — R451 Restlessness and agitation: Secondary | ICD-10-CM

## 2020-11-13 DIAGNOSIS — F0390 Unspecified dementia without behavioral disturbance: Secondary | ICD-10-CM | POA: Diagnosis not present

## 2020-11-13 DIAGNOSIS — I714 Abdominal aortic aneurysm, without rupture, unspecified: Secondary | ICD-10-CM

## 2020-11-13 DIAGNOSIS — S4292XA Fracture of left shoulder girdle, part unspecified, initial encounter for closed fracture: Secondary | ICD-10-CM | POA: Diagnosis not present

## 2020-11-13 DIAGNOSIS — I2510 Atherosclerotic heart disease of native coronary artery without angina pectoris: Secondary | ICD-10-CM

## 2020-11-13 DIAGNOSIS — I10 Essential (primary) hypertension: Secondary | ICD-10-CM

## 2020-11-13 LAB — SARS CORONAVIRUS 2 (TAT 6-24 HRS): SARS Coronavirus 2: NEGATIVE

## 2020-11-13 MED ORDER — ACETAMINOPHEN 500 MG PO TABS: 1000.0000 mg | ORAL_TABLET | Freq: Four times a day (QID) | ORAL | 0 refills | Status: AC | PRN

## 2020-11-13 NOTE — Discharge Summary (Signed)
Physician Discharge Summary  LIBERTI APPLETON ONG:[id number] DOB: 1931-07-29 DOA: 11/07/2020  PCP: Colon Branch, MD  Admit date: 11/07/2020 Discharge date: 11/13/2020  Admitted From: Home Disposition: SNF   Recommendations for Outpatient Follow-up:  Follow up with PCP in 1-2 weeks. Follow up with orthopedics in 2 weeks, Dr. Tamera Punt.  Home Health: N/A Equipment/Devices: N/A Discharge Condition: Stable CODE STATUS: DNR Diet recommendation: Heart healthy  Brief/Interim Summary: LETANYA FROH is a 87 y.o. female with a history of AAA, right breast CA, COPD, CVA, HTN, HLD, prediabetes, admission for pneumonia complicated by delirium earlier this month, subsequently discharged to [facility] 9/9, then to home 9/23, and fell at home with [facility] and subsequent left shoulder fracture and dislocation not able to be reduced in ED. Orthopedics is consulted, recommending reduction (closed vs. open) in OR. Cardiology was consulted for recent echo suggesting apical hypertrophic cardiomyopathy and interstitial thickening and small left pleural effusion on CXR, deemed the patient at mod-severe risk for perioperative complications. On 9/29 she was taken to the OR for successful closed reduction under regional nerve block.  Discharge Diagnoses:  Principal Problem:   Fracture dislocation of left shoulder joint, closed, initial encounter Active Problems:   Essential hypertension   History of thyroidectomy   Hypertrophic cardiomyopathy ([place])   Dysphagia   Dementia without behavioral disturbance (HCC)  Left shoulder fracture, dislocation: s/p closed reduction under interscalene regional nerve block in OR 9/29.  - Orthopedics recommends NWB LUE, keep in sling at all times including sleeping, removed only for bathing or dressing. Follow up with Dr. Tamera Punt in 2 weeks.    Falls at home: Typically gets around with a walker and is now NWB to LUE.  - Will require intensive PT/OT to improve functional independence. PT and  OT recommend SNF which is being pursued.    Apical hypertrophic cardiomyopathy, frequent PVCs: No sustained dysrhythmias on my review of telemetry.  - Continue metoprolol succinate.  - Cardiology has evaluated the patient this admission. Has declined further investigations per cardiology.   Interstitial pulmonary edema, small left pleural effusion suggestive of acute HFrEF: Last echo LVEF appeared mildly decreased.  No true hypoxia documented, will wean to room air and monitor. - BNP grossly elevated, though no respiratory compromise and CXR showed no significant edema. Suspect attempt at diuresis would drop preload unnecessarily.   HTN: BP now down with improved pain control. - Continue metoprolol If PVC remain frequent, would increase metoprolol dose. Vital signs currently stable and normal. - Heart healthy diet     Hyperkalemia: Resolved.   HLD:  - Continue statin   History of CVA:  - Continue ASA, statin     PAD: Underwent angiography 12/03/15 revealing high-grade calcified ostial bilateral iliac artery stenoses with three-vessel runoff. She was treated with diamondback orbital rotational atherectomy, PTA and covered stenting using Lifestream cover stents of both iliac ostia. - Continue ASA, statin   Dementia with history of acute hospital delirium:  - Delirium precautions.  - Would avoid haldol as this may be implicated in tremors previously.    History of thyroidectomy: Must not have been total as she's not on supplement and TSH is wnl.  Discharge Instructions  Allergies as of 11/13/2020       Reactions   Septra [sulfamethoxazole-trimethoprim] Itching   Pt states she had a severe allergic reaction; she was in the ICU for 2 days because of taking this.   Ciprofloxacin Hcl Itching   Codeine Rash   Made her feel  very strange        Medication List     TAKE these medications    acetaminophen 500 MG tablet Commonly known as: TYLENOL Take 2 tablets (1,000 mg total)  by mouth every 6 (six) hours as needed (for pain). What changed:  when to take this reasons to take this   albuterol 108 (90 Base) MCG/ACT inhaler Commonly known as: VENTOLIN HFA Inhale 2 puffs into the lungs every 6 (six) hours as needed for wheezing or shortness of breath.   aspirin EC 81 MG tablet Take 81 mg by mouth every evening.   atorvastatin 40 MG tablet Commonly known as: LIPITOR TAKE 1 TABLET(40 MG) BY MOUTH DAILY What changed:  how much to take how to take this when to take this additional instructions   hydroxypropyl methylcellulose / hypromellose 2.5 % ophthalmic solution Commonly known as: ISOPTO TEARS / GONIOVISC Place 1 drop into both eyes 2 (two) times daily as needed for dry eyes.   METAMUCIL FIBER PO Take 1 packet by mouth daily.   metoprolol succinate 25 MG 24 hr tablet Commonly known as: Toprol XL Take 0.5 tablets (12.5 mg total) by mouth daily. What changed: when to take this   MILK OF MAGNESIA PO Take 1 Dose by mouth daily as needed (constipation).   RA SALINE ENEMA RE Place 1 Dose rectally daily as needed (constipation).   vitamin B-12 1000 MCG tablet Commonly known as: CYANOCOBALAMIN Take 1,000 mcg by mouth daily.        Follow-up Information     Tania Ade, MD. Schedule an appointment as soon as possible for a visit in 2 week(s).   Specialty: Orthopedic Surgery Contact information: [place] [address] [phone]         Colon Branch, MD Follow up.   Specialty: Internal Medicine Contact information: [address] [place] [place] [address] [phone]         Lorretta Harp, MD .   Specialties: Cardiology, Radiology Contact information: [address] [place] [address] [phone]                Allergies  Allergen Reactions   Septra [Sulfamethoxazole-Trimethoprim] Itching    Pt states she had a severe allergic reaction; she was in the  ICU for 2 days because of taking this.   Ciprofloxacin Hcl Itching   Codeine Rash    Made her feel very strange    Consultations: Orthopedics Cardiology  Procedures/Studies: DG Shoulder 1V Left  Result Date: 11/08/2020 CLINICAL DATA:  Left shoulder reduction EXAM: LEFT SHOULDER; DG C-ARM 1-60 MIN-NO REPORT COMPARISON:  11/07/2020 FINDINGS: Single image obtained via portable C-arm radiography shows interval reduction of left glenohumeral joint dislocation. Fracture deformity involving the greater tuberosity of the proximal humerus noted. IMPRESSION: Interval reduction of left glenohumeral joint dislocation. Electronically Signed   By: Kerby Moors M.D.   On: 11/08/2020 16:27   CT Head Wo Contrast  Result Date: 11/07/2020 CLINICAL DATA:  [place], left shoulder injury EXAM: CT HEAD WITHOUT CONTRAST TECHNIQUE: Contiguous axial images were obtained from the base of the skull through the vertex without intravenous contrast. COMPARISON:  10/10/2020 FINDINGS: Brain: Stable confluent hypodensities throughout the periventricular white matter consistent with chronic small-vessel ischemic changes. Chronic lacunar infarcts are again seen within the left basal ganglia. No signs of acute infarct or hemorrhage. Lateral ventricles and remaining midline structures are unremarkable. No acute extra-axial fluid collections. No mass effect.  Vascular: Stable atherosclerosis.  No hyperdense vessel. Skull: Normal. Negative for fracture or focal lesion. Sinuses/Orbits: No acute finding. Other: None. IMPRESSION: 1. Stable chronic ischemic changes.  No acute intracranial process. Electronically Signed   By: Randa Ngo M.D.   On: 11/07/2020 17:31   CT Cervical Spine Wo Contrast  Result Date: 11/07/2020 CLINICAL DATA:  [place], left shoulder injury EXAM: CT CERVICAL SPINE WITHOUT CONTRAST TECHNIQUE: Multidetector CT imaging of the cervical spine was performed without intravenous contrast. Multiplanar CT image reconstructions  were also generated. COMPARISON:  None. FINDINGS: Alignment: There is mild retrolisthesis of C4 on C5 and C5 on C6 likely due to spondylosis and facet hypertrophy. Otherwise alignment is anatomic. Skull base and vertebrae: No acute fracture. No primary bone lesion or focal pathologic process. Soft tissues and spinal canal: No prevertebral fluid or swelling. No visible canal hematoma. Disc levels: There is diffuse spondylosis from C3 through C7. Multilevel facet hypertrophy greatest at C2-3. Marked hypertrophic changes at the C1-2 interface. Upper chest: Airway is patent. Dependent atelectasis right upper lobe. Other: Reconstructed images demonstrate no additional findings. IMPRESSION: 1. No acute cervical spine fracture. 2. Multilevel degenerative changes. Electronically Signed   By: Randa Ngo M.D.   On: 11/07/2020 17:35   CT Shoulder Left Wo Contrast  Result Date: 11/07/2020 CLINICAL DATA:  Shoulder trauma, scapular fracture EXAM: CT OF THE UPPER LEFT EXTREMITY WITHOUT CONTRAST TECHNIQUE: Multidetector CT imaging of the upper left extremity was performed according to the standard protocol. COMPARISON:  None. FINDINGS: Bones/Joint/Cartilage Comminuted fracture of the left greater tuberosity with 2 cm of lateral displacement. Anterior shoulder dislocation with the humeral head perched on the anterior glenoid neck. No other acute fracture or dislocation. No aggressive osseous lesion. Mild arthropathy of the acromioclavicular joint. Ligaments Ligaments are suboptimally evaluated by CT. Muscles and Tendons Muscles are normal.  No muscle atrophy. Soft tissue No fluid collection or hematoma. No soft tissue mass. Thoracic aortic atherosclerosis. Carotid artery atherosclerosis. IMPRESSION: 1. Comminuted fracture of the left greater tuberosity with 2 cm of lateral displacement. Anterior shoulder dislocation with the humeral head perched on the anterior glenoid neck. Electronically Signed   By: Kathreen Devoid M.D.    On: 11/07/2020 19:37   DG CHEST PORT 1 VIEW  Result Date: 11/09/2020 CLINICAL DATA:  Hypoxia. EXAM: PORTABLE CHEST 1 VIEW COMPARISON:  Chest x-ray 11/07/2020. CT shoulder 11/07/2020. FINDINGS: Small left pleural effusion is unchanged. The right lung is clear. There is no pneumothorax. The heart is enlarged, unchanged. There are atherosclerotic calcifications of the aorta. Displaced greater tuberosity fracture is again noted in the left shoulder. IMPRESSION: Stable small left pleural effusion. Stable cardiomegaly. Electronically Signed   By: Ronney Asters M.D.   On: 11/09/2020 16:13   DG Chest Portable 1 View  Result Date: 11/07/2020 CLINICAL DATA:  Status post fall, left shoulder pain EXAM: PORTABLE CHEST 1 VIEW COMPARISON:  10/18/2020 FINDINGS: Mild bilateral interstitial thickening. Small left pleural effusion. Mild left basilar airspace disease concerning for atelectasis. Stable cardiomediastinal silhouette. No acute osseous abnormality. IMPRESSION: 1. Bilateral interstitial thickening concerning for mild interstitial edema with a small left pleural effusion. Electronically Signed   By: Kathreen Devoid M.D.   On: 11/07/2020 19:33   DG CHEST PORT 1 VIEW  Result Date: 10/18/2020 CLINICAL DATA:  SOB (shortness of breath) hx copd EXAM: PORTABLE CHEST - 1 VIEW COMPARISON:  10/10/2020 FINDINGS: Interval improvement in interstitial edema. Persistent left retrocardiac consolidation/atelectasis. No new airspace disease. Heart size and mediastinal contours are  within normal limits. Aortic Atherosclerosis (ICD10-170.0). Small bilateral pleural effusions. Visualized bones unremarkable. IMPRESSION: Improving interstitial edema or infiltrates, with persistent small pleural effusions. Electronically Signed   By: Lucrezia Europe M.D.   On: 10/18/2020 09:25   DG Shoulder Left  Result Date: 11/07/2020 CLINICAL DATA:  Fall EXAM: LEFT SHOULDER - 2+ VIEW COMPARISON:  None. FINDINGS: Anterior inferior dislocation of left  humeral head with respect to the glenoid fossa. Comminuted displaced fracture involving the superolateral humeral head with probable fracture at the inferior glenoid. IMPRESSION: Anterior dislocation of the left humeral head with associated comminuted fracture of the superolateral humeral head and suspected fracture of the inferior glenoid Electronically Signed   By: Donavan Foil M.D.   On: 11/07/2020 17:05   DG Swallowing Func-Speech Pathology  Result Date: 10/18/2020 Table formatting from the original result was not included. Objective Swallowing Evaluation: Type of Study: MBS-Modified Barium Swallow Study  Patient Details Name: TAVON CORRIHER MRN: [id number] Date of Birth: 1931-09-09 Today's Date: 10/18/2020 Time: SLP Start Time (ACUTE ONLY): 1008 -SLP Stop Time (ACUTE ONLY): 0092 SLP Time Calculation (min) (ACUTE ONLY): 11 min Past Medical History: Past Medical History: Diagnosis Date  AAA (abdominal aortic aneurysm) ([place])   sees Dr Eden Lathe   B12 deficiency   Cancer of right breast [facility]) [place] 11/28/2008  sees Dr Oneida Alar   COPD (chronic obstructive pulmonary disease) ([place]) 01/25/2014  CVA (cerebrovascular accident) ([place]) 2006  Diabetes mellitus ([place]) 05/29/2010  A1c 6.0 on October 2011   Hyperlipidemia   Hypertension   Osteoporosis 2003  Pneumonia ~ 2014; ~ 2015  Prolapse of female bladder, acquired   Shingles   repeated episone 9/11 went to a UC  TIA (transient ischemic attack) ~ 2005  Transient hypothyroidism   d/t thyroidectomy  UTI (lower urinary tract infection) May 2013 Past Surgical History: Past Surgical History: Procedure Laterality Date  ABDOMINAL HYSTERECTOMY    no oophorectomy  CATARACT EXTRACTION W/ INTRAOCULAR LENS  IMPLANT, BILATERAL Bilateral   DILATION AND CURETTAGE OF UTERUS    MASTECTOMY, RADICAL Right 1960s  remotely   PERIPHERAL VASCULAR CATHETERIZATION  12/03/2015  PERIPHERAL VASCULAR CATHETERIZATION N/A 12/03/2015  Procedure: Abdominal Aortogram;  Surgeon: Lorretta Harp, MD;  Location: [place] CV LAB;  Service: Cardiovascular;  Laterality: N/A;  PERIPHERAL VASCULAR CATHETERIZATION Bilateral 12/03/2015  Procedure: Lower Extremity Angiography;  Surgeon: Lorretta Harp, MD;  Location: [place] CV LAB;  Service: Cardiovascular;  Laterality: Bilateral;  PERIPHERAL VASCULAR CATHETERIZATION Bilateral 12/03/2015  Procedure: Peripheral Vascular Intervention;  Surgeon: Lorretta Harp, MD;  Location: [place] CV LAB;  Service: Cardiovascular;  Laterality: Bilateral;  61mmx58mm Lifestream bilateral Common Iliac Arteries  PERIPHERAL VASCULAR CATHETERIZATION Bilateral 12/03/2015  Procedure: Peripheral Vascular Atherectomy;  Surgeon: Lorretta Harp, MD;  Location: [place] CV LAB;  Service: Cardiovascular;  Laterality: Bilateral;  Common Iliac Arteries  THYROIDECTOMY   HPI: Pt is an 87 y.o. female who presented with confusion. CT head negative.    CXR 8/31: Small left pleural effusion with adjacent airspace disease which may reflect atelectasis or pneumonia. Pt reported to MD having difficulty swallow and being fearful of choking; pt stated at that time that she comforatbly eats mashed foods like banana, but gets scared if food has increased consistency. Discharge cancelled and SLP consulted. RN's note on 9/7 denies pt having difficulty. PMH: essential hypertension, hypertrophic cardiomyopathy, tobacco abuse, history of COPD, hyperlipidemia.  No data recorded Assessment / Plan / Recommendation CHL IP CLINICAL IMPRESSIONS  10/18/2020 Clinical Impression Pt presents with oropharyngeal dysphagia characterized by impaired bolus cohesion, a pharyngeal delay, reduced pharyngeal constriction, and reduced anterior laryngeal movement. She demonstrated premature spillage to the pyriform sinuses, posterior pharyngeal wall residue, and mild pyriform sinus residue. Penetration (PAS 2) was noted with consecutive swallows of nectar thick liquids and aspiration (PAS 7) was observed once when  a pill was taken with nectar thick liquids via straw. Penetration (PAS 3,5) and silent aspiration (PAS 8) were noted with thin liquids via cup. Laryngeal invasion was secondary to the pharyngeal delay and only larger amounts of aspiration triggered coughing, but this did not facilitate expulsion of the aspirate. A chin tuck posture and effortful swallows were ineffective in eliminating laryngeal invasion, but an effortful swallow did improve pharyngeal clearance. A dysphagia 3 diet with nectar thick liquids is recommended at this time. SLP will follow for treatment. SLP Visit Diagnosis Dysphagia, oropharyngeal phase (R13.12) Attention and concentration deficit following -- Frontal lobe and executive function deficit following -- Impact on safety and function Mild aspiration risk   CHL IP TREATMENT RECOMMENDATION 10/18/2020 Treatment Recommendations Therapy as outlined in treatment plan below   Prognosis 10/18/2020 Prognosis for Safe Diet Advancement Good Barriers to Reach Goals -- Barriers/Prognosis Comment -- CHL IP DIET RECOMMENDATION 10/18/2020 SLP Diet Recommendations Dysphagia 3 (Mech soft) solids;Nectar thick liquid Liquid Administration via Cup;No straw Medication Administration Whole meds with puree Compensations Slow rate;Small sips/bites Postural Changes Seated upright at 90 degrees   CHL IP OTHER RECOMMENDATIONS 10/18/2020 Recommended Consults -- Oral Care Recommendations Oral care BID Other Recommendations --   CHL IP FOLLOW UP RECOMMENDATIONS 10/18/2020 Follow up Recommendations Skilled Nursing facility   [facility] IP FREQUENCY AND DURATION 10/18/2020 Speech Therapy Frequency (ACUTE ONLY) min 2x/week Treatment Duration 2 weeks      CHL IP ORAL PHASE 10/18/2020 Oral Phase Impaired Oral - Pudding Teaspoon -- Oral - Pudding Cup -- Oral - Honey Teaspoon -- Oral - Honey Cup -- Oral - Nectar Teaspoon -- Oral - Nectar Cup Decreased bolus cohesion;Premature spillage Oral - Nectar Straw Decreased bolus cohesion;Premature spillage  Oral - Thin Teaspoon -- Oral - Thin Cup Decreased bolus cohesion;Premature spillage Oral - Thin Straw Decreased velopharyngeal closure Oral - Puree Decreased bolus cohesion;Premature spillage Oral - Mech Soft -- Oral - Regular Impaired mastication Oral - Multi-Consistency -- Oral - Pill Decreased bolus cohesion;Premature spillage Oral Phase - Comment --  CHL IP PHARYNGEAL PHASE 10/18/2020 Pharyngeal Phase Impaired Pharyngeal- Pudding Teaspoon -- Pharyngeal -- Pharyngeal- Pudding Cup -- Pharyngeal -- Pharyngeal- Honey Teaspoon -- Pharyngeal -- Pharyngeal- Honey Cup -- Pharyngeal -- Pharyngeal- Nectar Teaspoon -- Pharyngeal -- Pharyngeal- Nectar Cup Delayed swallow initiation-pyriform sinuses;Reduced pharyngeal peristalsis;Pharyngeal residue - pyriform;Pharyngeal residue - posterior pharnyx;Penetration/Aspiration during swallow;Reduced anterior laryngeal mobility Pharyngeal Material enters airway, remains ABOVE vocal cords then ejected out Pharyngeal- Nectar Straw Delayed swallow initiation-pyriform sinuses;Reduced pharyngeal peristalsis;Pharyngeal residue - pyriform;Pharyngeal residue - posterior pharnyx;Penetration/Aspiration during swallow;Reduced anterior laryngeal mobility Pharyngeal Material enters airway, remains ABOVE vocal cords then ejected out;Material enters airway, passes BELOW cords and not ejected out despite cough attempt by patient Pharyngeal- Thin Teaspoon -- Pharyngeal -- Pharyngeal- Thin Cup Delayed swallow initiation-pyriform sinuses;Reduced pharyngeal peristalsis;Pharyngeal residue - pyriform;Pharyngeal residue - posterior pharnyx;Penetration/Aspiration during swallow;Reduced anterior laryngeal mobility Pharyngeal Material enters airway, remains ABOVE vocal cords and not ejected out;Material enters airway, CONTACTS cords and not ejected out;Material enters airway, passes BELOW cords without attempt by patient to eject out (silent aspiration) Pharyngeal- Thin Straw -- Pharyngeal -- Pharyngeal- Puree  Delayed swallow  initiation-pyriform sinuses;Reduced pharyngeal peristalsis;Pharyngeal residue - pyriform;Pharyngeal residue - posterior pharnyx;Penetration/Aspiration during swallow;Reduced anterior laryngeal mobility Pharyngeal -- Pharyngeal- Mechanical Soft -- Pharyngeal -- Pharyngeal- Regular Delayed swallow initiation-pyriform sinuses;Reduced pharyngeal peristalsis;Pharyngeal residue - pyriform;Pharyngeal residue - posterior pharnyx;Reduced anterior laryngeal mobility Pharyngeal -- Pharyngeal- Multi-consistency -- Pharyngeal -- Pharyngeal- Pill Delayed swallow initiation-pyriform sinuses;Reduced pharyngeal peristalsis;Pharyngeal residue - pyriform;Pharyngeal residue - posterior pharnyx;Reduced anterior laryngeal mobility Pharyngeal -- Pharyngeal Comment --  CHL IP CERVICAL ESOPHAGEAL PHASE 10/18/2020 Cervical Esophageal Phase WFL Pudding Teaspoon -- Pudding Cup -- Honey Teaspoon -- Honey Cup -- Nectar Teaspoon -- Nectar Cup -- Nectar Straw -- Thin Teaspoon -- Thin Cup -- Thin Straw -- Puree -- Mechanical Soft -- Regular -- Multi-consistency -- Pill -- Cervical Esophageal Comment -- Shanika I. Hardin Negus, [place], [place] Office number [phone] Pager [phone] Horton Marshall 10/18/2020, 11:01 AM              DG C-Arm 1-60 Min-No Report  Result Date: 11/08/2020 CLINICAL DATA:  Left shoulder reduction EXAM: LEFT SHOULDER; DG C-ARM 1-60 MIN-NO REPORT COMPARISON:  11/07/2020 FINDINGS: Single image obtained via portable C-arm radiography shows interval reduction of left glenohumeral joint dislocation. Fracture deformity involving the greater tuberosity of the proximal humerus noted. IMPRESSION: Interval reduction of left glenohumeral joint dislocation. Electronically Signed   By: Kerby Moors M.D.   On: 11/08/2020 16:27    11/08/2020 Dr. Tamera Punt: Closed reduction left shoulder fracture dislocation under anesthesia  Subjective: Pain controlled. No new complaints.  Discharge  Exam: Vitals:   11/12/20 2058 11/13/20 0450  BP: (!) 150/60 (!) 154/75  Pulse: 71 71  Resp: 20 18  Temp: 97.9 F (36.6 C) (!) 97.5 F (36.4 C)  SpO2: 96% 97%   General: Pt is alert, awake, not in acute distress Cardiovascular: RRR, S1/S2 +, no rubs, no gallops Respiratory: CTA bilaterally, no wheezing, no rhonchi Abdominal: Soft, NT, ND, bowel sounds + Extremities: No edema, no cyanosis. Tenderness on left shoulder.   Labs: BNP (last 3 results) Recent Labs    11/08/20 1018  BNP 7,673.4*   Basic Metabolic Panel: Recent Labs  Lab 11/07/20 1931 11/08/20 0716 11/09/20 0504  NA 139 139 137  K 5.5* 3.8 3.8  CL 105 108 104  CO2 27 26 23   GLUCOSE 105* 116* 111*  BUN 18 15 13   CREATININE 0.74 0.49 0.44  CALCIUM 8.1* 8.8* 8.3*   Liver Function Tests: No results for input(s): AST, ALT, ALKPHOS, BILITOT, PROT, ALBUMIN in the last 168 hours. No results for input(s): LIPASE, AMYLASE in the last 168 hours. No results for input(s): AMMONIA in the last 168 hours. CBC: Recent Labs  Lab 11/07/20 1931 11/08/20 0716 11/09/20 0504  WBC 10.7* 11.4* 9.3  HGB 12.6 12.8 12.6  HCT 38.5 38.9 38.2  MCV 93.2 93.1 93.2  PLT 244 260 261   Cardiac Enzymes: No results for input(s): CKTOTAL, CKMB, CKMBINDEX, TROPONINI in the last 168 hours. BNP: Invalid input(s): POCBNP CBG: Recent Labs  Lab 11/08/20 1449 11/12/20 0724  GLUCAP 106* 88   D-Dimer No results for input(s): DDIMER in the last 72 hours. Hgb A1c No results for input(s): HGBA1C in the last 72 hours. Lipid Profile No results for input(s): CHOL, HDL, LDLCALC, TRIG, CHOLHDL, LDLDIRECT in the last 72 hours. Thyroid function studies No results for input(s): TSH, T4TOTAL, T3FREE, THYROIDAB in the last 72 hours.  Invalid input(s): FREET3 Anemia work up No results for input(s): VITAMINB12, FOLATE, FERRITIN, TIBC, IRON, RETICCTPCT in the last 66  hours. Urinalysis    Component Value Date/Time   COLORURINE YELLOW  10/10/2020 1645   APPEARANCEUR HAZY (A) 10/10/2020 1645   LABSPEC 1.010 10/10/2020 1645   PHURINE 5.0 10/10/2020 1645   GLUCOSEU NEGATIVE 10/10/2020 1645   GLUCOSEU NEGATIVE 08/21/2014 1027   HGBUR MODERATE (A) 10/10/2020 1645   HGBUR negative 09/25/2009 1034   BILIRUBINUR NEGATIVE 10/10/2020 1645   BILIRUBINUR Neg 05/26/2013 0827   KETONESUR NEGATIVE 10/10/2020 1645   PROTEINUR 100 (A) 10/10/2020 1645   UROBILINOGEN 0.2 08/21/2014 1027   NITRITE POSITIVE (A) 10/10/2020 [place] 10/10/2020 1645    Microbiology Recent Results (from the past 240 hour(s))  Resp Panel by RT-PCR (Flu A&B, Covid) Nasopharyngeal Swab     Status: None   Collection Time: 11/07/20  7:31 PM   Specimen: Nasopharyngeal Swab; Nasopharyngeal(NP) swabs in vial transport medium  Result Value Ref Range Status   SARS Coronavirus 2 by RT PCR NEGATIVE NEGATIVE Final    Comment: (NOTE) SARS-CoV-2 target nucleic acids are NOT DETECTED.  The SARS-CoV-2 RNA is generally detectable in upper respiratory specimens during the acute phase of infection. The lowest concentration of SARS-CoV-2 viral copies this assay can detect is 138 copies/mL. A negative result does not preclude SARS-Cov-2 infection and should not be used as the sole basis for treatment or other patient management decisions. A negative result may occur with  improper specimen collection/handling, submission of specimen other than nasopharyngeal swab, presence of viral mutation(s) within the areas targeted by this assay, and inadequate number of viral copies(<138 copies/mL). A negative result must be combined with clinical observations, patient history, and epidemiological information. The expected result is Negative.  Fact Sheet for Patients:  EntrepreneurPulse.com.au  Fact Sheet for Healthcare Providers:  IncredibleEmployment.be  This test is no t yet approved or cleared by the [place] FDA  and  has been authorized for detection and/or diagnosis of SARS-CoV-2 by FDA under an Emergency Use Authorization (EUA). This EUA will remain  in effect (meaning this test can be used) for the duration of the COVID-19 declaration under Section 564(b)(1) of the Act, 21 U.S.C.section 360bbb-3(b)(1), unless the authorization is terminated  or revoked sooner.       Influenza A by PCR NEGATIVE NEGATIVE Final   Influenza B by PCR NEGATIVE NEGATIVE Final    Comment: (NOTE) The Xpert Xpress SARS-CoV-2/FLU/RSV plus assay is intended as an aid in the diagnosis of influenza from Nasopharyngeal swab specimens and should not be used as a sole basis for treatment. Nasal washings and aspirates are unacceptable for Xpert Xpress SARS-CoV-2/FLU/RSV testing.  Fact Sheet for Patients: EntrepreneurPulse.com.au  Fact Sheet for Healthcare Providers: IncredibleEmployment.be  This test is not yet approved or cleared by the [place] FDA and has been authorized for detection and/or diagnosis of SARS-CoV-2 by FDA under an Emergency Use Authorization (EUA). This EUA will remain in effect (meaning this test can be used) for the duration of the COVID-19 declaration under Section 564(b)(1) of the Act, 21 U.S.C. section 360bbb-3(b)(1), unless the authorization is terminated or revoked.  Performed at [facility], [place] [address], [place], [place] [address]   Surgical PCR screen     Status: None   Collection Time: 11/08/20  1:44 AM   Specimen: Nasal Mucosa; Nasal Swab  Result Value Ref Range Status   MRSA, PCR NEGATIVE NEGATIVE Final   Staphylococcus aureus NEGATIVE NEGATIVE Final    Comment: (NOTE) The Xpert SA Assay (FDA approved for NASAL specimens in  patients 43 years of age and older), is one component of a comprehensive surveillance program. It is not intended to diagnose infection nor to guide or monitor treatment. Performed at [facility], [place] [address] [place], [place] [address]   SARS CORONAVIRUS 2 (TAT 6-24 HRS) Nasopharyngeal Nasopharyngeal Swab     Status: None   Collection Time: 11/12/20  4:35 PM   Specimen: Nasopharyngeal Swab  Result Value Ref Range Status   SARS Coronavirus 2 NEGATIVE NEGATIVE Final    Comment: (NOTE) SARS-CoV-2 target nucleic acids are NOT DETECTED.  The SARS-CoV-2 RNA is generally detectable in upper and lower respiratory specimens during the acute phase of infection. Negative results do not preclude SARS-CoV-2 infection, do not rule out co-infections with other pathogens, and should not be used as the sole basis for treatment or other patient management decisions. Negative results must be combined with clinical observations, patient history, and epidemiological information. The expected result is Negative.  Fact Sheet for Patients: SugarRoll.be  Fact Sheet for Healthcare Providers: https://www.woods-mathews.com/  This test is not yet approved or cleared by the [place] FDA and  has been authorized for detection and/or diagnosis of SARS-CoV-2 by FDA under an Emergency Use Authorization (EUA). This EUA will remain  in effect (meaning this test can be used) for the duration of the COVID-19 declaration under Se ction 564(b)(1) of the Act, 21 U.S.C. section 360bbb-3(b)(1), unless the authorization is terminated or revoked sooner.  Performed at [facility], [place] [address], [place], [place] [address]     Time coordinating discharge: Approximately 40 minutes  Patrecia Pour, MD  [facility] 11/13/2020, 12:16 PM

## 2020-11-13 NOTE — TOC Transition Note (Addendum)
Transition of Care [facility]) - CM/SW Discharge Note   Patient Details  Name: TOMEEKA PLAUGHER MRN: [id number] Date of Birth: 1932/02/01  Transition of Care [facility]) CM/SW Contact:  Dessa Phi, RN Phone Number: 11/13/2020, 10:26 AM   Clinical Narrative:  Awaiting d/c summary for d/c to [facility] prior note.  12:56p-PTAR called for 2p pick up-per [facility] request-going to rm#[phone],nsg call report tel#[phone]. PTAR packet @ nsg station.No further CM needs.   Final next level of care: Skilled Nursing Facility Barriers to Discharge: No Barriers Identified   Patient Goals and CMS Choice   CMS Medicare.gov Compare Post Acute Care list provided to:: Patient Represenative (must comment) ([name] [phone]-left vm) Choice offered to / list presented to : Adult Children  Discharge Placement                       Discharge Plan and Services   Discharge Planning Services: CM Consult                                 Social Determinants of Health (SDOH) Interventions     Readmission Risk Interventions No flowsheet data found.

## 2020-11-13 NOTE — Progress Notes (Deleted)
Cardiology Clinic Note   Patient Name: Crystal Osborne Date of Encounter: 11/13/2020  Primary Care Provider:  Colon Branch, MD Primary Cardiologist:  Crystal Burow, MD  Patient Profile    ***  Past Medical History    Past Medical History:  Diagnosis Date   AAA (abdominal aortic aneurysm)    sees Dr Crystal Osborne    B12 deficiency    Cancer of right breast [facility]) [place] 11/28/2008   sees Dr Crystal Osborne    COPD (chronic obstructive pulmonary disease) ([place]) 01/25/2014   CVA (cerebrovascular accident) ([place]) 2006   Diabetes mellitus ([place]) 05/29/2010   A1c 6.0 on October 2011    Hyperlipidemia    Hypertension    Osteoporosis 2003   Pneumonia ~ 2014; ~ 2015   Prolapse of female bladder, acquired    Shingles    repeated episone 9/11 went to a UC   TIA (transient ischemic attack) ~ 2005   Transient hypothyroidism    d/t thyroidectomy   UTI (lower urinary tract infection) May 2013   Past Surgical History:  Procedure Laterality Date   ABDOMINAL HYSTERECTOMY     no oophorectomy   CATARACT EXTRACTION W/ INTRAOCULAR LENS  IMPLANT, BILATERAL Bilateral    DILATION AND CURETTAGE OF UTERUS     MASTECTOMY, RADICAL Right 1960s   remotely    PERIPHERAL VASCULAR CATHETERIZATION  12/03/2015   PERIPHERAL VASCULAR CATHETERIZATION N/A 12/03/2015   Procedure: Abdominal Aortogram;  Surgeon: Crystal Harp, MD;  Location: [place] [facility];  Service: Cardiovascular;  Laterality: N/A;   PERIPHERAL VASCULAR CATHETERIZATION Bilateral 12/03/2015   Procedure: Lower Extremity Angiography;  Surgeon: Crystal Harp, MD;  Location: [place] [facility];  Service: Cardiovascular;  Laterality: Bilateral;   PERIPHERAL VASCULAR CATHETERIZATION Bilateral 12/03/2015   Procedure: Peripheral Vascular Intervention;  Surgeon: Crystal Harp, MD;  Location: [place] [facility];  Service: Cardiovascular;  Laterality: Bilateral;  62mmx58mm Lifestream bilateral Common Iliac Arteries   PERIPHERAL  VASCULAR CATHETERIZATION Bilateral 12/03/2015   Procedure: Peripheral Vascular Atherectomy;  Surgeon: Crystal Harp, MD;  Location: [place] [facility];  Service: Cardiovascular;  Laterality: Bilateral;  Common Iliac Arteries   SHOULDER CLOSED REDUCTION Left 11/08/2020   Procedure: CLOSED MANIPULATION SHOULDER;  Surgeon: Crystal Ade, MD;  Location: [facility];  Service: Orthopedics;  Laterality: Left;   THYROIDECTOMY      Allergies  Allergies  Allergen Reactions   Septra [Sulfamethoxazole-Trimethoprim] Itching    Pt states she had a severe allergic reaction; she was in the ICU for 2 days because of taking this.   Ciprofloxacin Hcl Itching   Codeine Rash    Made her feel very strange    History of Present Illness    ***  Home Medications    Prior to Admission medications   Medication Sig Start Date End Date Taking? Authorizing Provider  acetaminophen (TYLENOL) 500 MG tablet Take 1,000 mg by mouth 2 (two) times daily as needed for moderate pain (for pain.).    [provider]  albuterol (VENTOLIN HFA) 108 (90 Base) MCG/ACT inhaler Inhale 2 puffs into the lungs every 6 (six) hours as needed for wheezing or shortness of breath. 11/03/20   Crystal Osborne, Crystal Osborne  aspirin EC 81 MG tablet Take 81 mg by mouth every evening.    [provider]  atorvastatin (LIPITOR) 40 MG tablet TAKE 1 TABLET(40 MG) BY MOUTH DAILY Patient taking differently: Take 40 mg by mouth daily. 11/03/20  Crystal Osborne, Crystal Osborne  hydroxypropyl methylcellulose / hypromellose (ISOPTO TEARS / GONIOVISC) 2.5 % ophthalmic solution Place 1 drop into both eyes 2 (two) times daily as needed for dry eyes.    [provider]  Magnesium Hydroxide (MILK OF MAGNESIA PO) Take 1 Dose by mouth daily as needed (constipation).    [provider]  METAMUCIL FIBER PO Take 1 packet by mouth daily.    [provider]  metoprolol succinate (TOPROL XL) 25 MG 24 hr tablet Take 0.5 tablets  (12.5 mg total) by mouth daily. Patient taking differently: Take 12.5 mg by mouth 2 (two) times daily. 11/03/20   Crystal Osborne, Crystal Osborne  Sodium Phosphates (RA SALINE ENEMA RE) Place 1 Dose rectally daily as needed (constipation).    [provider]  vitamin B-12 (CYANOCOBALAMIN) 1000 MCG tablet Take 1,000 mcg by mouth daily.    [provider]    Family History    Family History  Problem Relation Age of Onset   Cancer Other        sister (lung) , brother (mouth)   Crystal cancer Neg Hx    Coronary artery disease Neg Hx    Diabetes type II Neg Hx    Breast cancer Neg Hx    She indicated that her mother is deceased. She indicated that her father is deceased. She indicated that the status of her neg hx is unknown. She indicated that the status of her other is unknown.  Social History    Social History   Socioeconomic History   Marital status: Divorced    Spouse name: Not on file   Number of children: 3   Years of education: Not on file   Highest education level: Not on file  Occupational History   Occupation: retired   Tobacco Use   Smoking status: Every Day    Packs/day: 0.50    Years: 67.00    Pack years: 33.50    Types: Cigarettes   Smokeless tobacco: Never   Tobacco comments:    1/2 ppd  Substance and Sexual Activity   Alcohol use: No    Alcohol/week: 0.0 standard drinks   Drug use: No   Sexual activity: Never  Other Topics Concern   Not on file  Social History Narrative   Lives by herself , still drives some    Has 3 children, 1 living son    Emergency contact: Crystal Osborne [phone], he lives in the [place] area   Lost a son ~ 2017, he had  lung ca, stroke (09-2011)    Lost another son Crystal Osborne) 2019/02/19     Lost a sister died 20-15           Social Determinants of Health   Financial Resource Strain: Not on file  Food Insecurity: Not on file  Transportation Needs: Not on file  Physical Activity: Not on file  Stress: Not on file  Social  Connections: Not on file  Intimate Partner Violence: Not on file     Review of Systems    General:  No chills, fever, night sweats or weight changes.  Cardiovascular:  No chest pain, dyspnea on exertion, edema, orthopnea, palpitations, paroxysmal nocturnal dyspnea. Dermatological: No rash, lesions/masses Respiratory: No cough, dyspnea Urologic: No hematuria, dysuria Abdominal:   No nausea, vomiting, diarrhea, bright red blood per rectum, melena, or hematemesis Neurologic:  No visual changes, wkns, changes in mental status. All other systems reviewed and are otherwise negative except as noted above.  Physical Exam    VS:  There were no vitals taken for this visit. , BMI There is no height or weight on file to calculate BMI. GEN: Well nourished, well developed, in no acute distress. HEENT: normal. Neck: Supple, no JVD, carotid bruits, or masses. Cardiac: RRR, no murmurs, rubs, or gallops. No clubbing, cyanosis, edema.  Radials/DP/PT 2+ and equal bilaterally.  Respiratory:  Respirations regular and unlabored, clear to auscultation bilaterally. GI: Soft, nontender, nondistended, BS + x 4. MS: no deformity or atrophy. Skin: warm and dry, no rash. Neuro:  Strength and sensation are intact. Psych: Normal affect.  Accessory Clinical Findings    Recent Labs: 10/11/2020: TSH 1.765 10/18/2020: ALT 25; Magnesium 1.8 11/08/2020: B Natriuretic Peptide 2,830.3 11/09/2020: BUN 13; Creatinine, Ser 0.44; Hemoglobin 12.6; Platelets 261; Potassium 3.8; Sodium 137   Recent Lipid Panel    Component Value Date/Time   CHOL 134 10/24/2019 1154   TRIG 117 10/24/2019 1154   TRIG 112 01/06/2006 0841   HDL 51 10/24/2019 1154   CHOLHDL 2.6 10/24/2019 1154   VLDL 21.8 07/19/2018 0919   LDLCALC 63 10/24/2019 1154   LDLDIRECT 149.2 08/03/2006 1054    ECG personally reviewed by me today- *** - No acute changes  Assessment & Plan   1.  ***   Jossie Ng. Era Parr Osborne-C    11/13/2020, 9:14 AM [place] [address] Office [phone] Fax [phone]  Notice: This dictation was prepared with Dragon dictation along with smaller phrase technology. Any transcriptional errors that result from this process are unintentional and may not be corrected upon review.  I spent***minutes examining this patient, reviewing medications, and using patient centered shared decision making involving her cardiac care.  Prior to her visit I spent greater than 20 minutes reviewing her past medical history,  medications, and prior cardiac tests.

## 2020-11-13 NOTE — Telephone Encounter (Signed)
Plan of care signed and faxed back to [facility] at [phone]. Form sent for scanning.

## 2020-11-13 NOTE — Progress Notes (Signed)
Report called to [facility]. Zahir Eisenhour Johnson, RN 

## 2020-11-13 NOTE — Plan of Care (Signed)
  Problem: Education: Goal: Knowledge of General Education information will improve Description: Including pain rating scale, medication(s)/side effects and non-pharmacologic comfort measures Outcome: Completed/Met   Problem: Activity: Goal: Risk for activity intolerance will decrease Outcome: Completed/Met   Problem: Pain Managment: Goal: General experience of comfort will improve Outcome: Completed/Met

## 2020-11-13 NOTE — Progress Notes (Signed)
Occupational Therapy Treatment Patient Details Name: Crystal Osborne MRN: [id number] DOB: 1931-03-08 Today's Date: 11/13/2020   History of present illness WESTLYN GLAZA is an 87 y.o. female presents after fall with left proximal humeral fracture dislocation; s/p closed reduction L shoulder fracture dislocation under anesthesia 9/29. PMH: AAA, breast CA, COPD, CVA, diabetes, HTN, osteoporosis, shingles, TIA   OT comments  Patient was educated on proper sling placement on UE and when to ask someone to adjust it. Patient verbalized understanding but needed max A to adjust sling. Patient was eager to participate in functional mobility with hand held assist and 2 LOB. Patient's discharge plan remains appropriate at this time. OT will continue to follow acutely.     Recommendations for follow up therapy are one component of a multi-disciplinary discharge planning process, led by the attending physician.  Recommendations may be updated based on patient status, additional functional criteria and insurance authorization.    Follow Up Recommendations  SNF    Equipment Recommendations  None recommended by OT    Recommendations for Other Services      Precautions / Restrictions Precautions Precautions: Fall Precaution Comments: 1 fall last week. NO ROM sling off for bathing/dressing only. Required Braces or Orthoses: Sling Restrictions Weight Bearing Restrictions: Yes LUE Weight Bearing: Non weight bearing       Mobility Bed Mobility Overal bed mobility: Needs Assistance Bed Mobility: Supine to Sit     Supine to sit: Min guard;HOB elevated          Transfers                      Balance Overall balance assessment: Mild deficits observed, not formally tested                                         ADL either performed or assessed with clinical judgement   ADL Overall ADL's : Needs assistance/impaired Eating/Feeding: Set up;Sitting Eating/Feeding  Details (indicate cue type and reason): patient was able to sit on edge of bed and maintain sitting balnce and complete end of breaskfast during session. patient was educated on sling management with max A for adjust ment of sling. patient was able to teach back therapist later in session what position sling needed to be on arm and when to ask for someone to adjust it for her.                                   General ADL Comments: patient was min guard with hand held assistance for functional mobility in hallway per patient request. patient was min guard to transfer from edge of bed to recliner in room with education on importance of having staff present for transfers.     Vision Patient Visual Report: No change from baseline Vision Assessment?: No apparent visual deficits   Perception     Praxis      Cognition Arousal/Alertness: Awake/alert Behavior During Therapy: WFL for tasks assessed/performed Overall Cognitive Status: Within Functional Limits for tasks assessed                                          Exercises     Shoulder Instructions  General Comments      Pertinent Vitals/ Pain       Pain Assessment: No/denies pain  Home Living                                          Prior Functioning/Environment              Frequency  Min 2X/week        Progress Toward Goals  OT Goals(current goals can now be found in the care plan section)  Progress towards OT goals: Progressing toward goals  Acute Rehab OT Goals Patient Stated Goal: to go home  Plan Discharge plan remains appropriate    Co-evaluation                 AM-PAC OT "6 Clicks" Daily Activity     Outcome Measure   Help from another person eating meals?: A Little Help from another person taking care of personal grooming?: A Little Help from another person toileting, which includes using toliet, bedpan, or urinal?: A Lot Help from  another person bathing (including washing, rinsing, drying)?: A Lot Help from another person to put on and taking off regular upper body clothing?: A Lot Help from another person to put on and taking off regular lower body clothing?: A Lot 6 Click Score: 14    End of Session    OT Visit Diagnosis: Unsteadiness on feet (R26.81);History of falling (Z91.81)   Activity Tolerance Patient tolerated treatment well   Patient Left in chair;with call bell/phone within reach;with chair alarm set   Nurse Communication Mobility status        Time: 2449-7530 OT Time Calculation (min): 28 min  Charges: OT General Charges $OT Visit: 1 Visit OT Treatments $Self Care/Home Management : 23-37 mins  Jackelyn Poling OTR/L, MS Acute Rehabilitation Department Office# [phone] Pager# [phone]   [place]a P[place] 11/13/2020, 10:04 AM

## 2020-11-14 DIAGNOSIS — I509 Heart failure, unspecified: Secondary | ICD-10-CM | POA: Diagnosis not present

## 2020-11-14 DIAGNOSIS — E119 Type 2 diabetes mellitus without complications: Secondary | ICD-10-CM | POA: Diagnosis not present

## 2020-11-14 DIAGNOSIS — S4292XD Fracture of left shoulder girdle, part unspecified, subsequent encounter for fracture with routine healing: Secondary | ICD-10-CM | POA: Diagnosis not present

## 2020-11-14 DIAGNOSIS — I779 Disorder of arteries and arterioles, unspecified: Secondary | ICD-10-CM | POA: Diagnosis not present

## 2020-11-14 DIAGNOSIS — J449 Chronic obstructive pulmonary disease, unspecified: Secondary | ICD-10-CM | POA: Diagnosis not present

## 2020-11-14 DIAGNOSIS — R1310 Dysphagia, unspecified: Secondary | ICD-10-CM | POA: Diagnosis not present

## 2020-11-14 DIAGNOSIS — Z7401 Bed confinement status: Secondary | ICD-10-CM | POA: Diagnosis not present

## 2020-11-14 DIAGNOSIS — I10 Essential (primary) hypertension: Secondary | ICD-10-CM | POA: Diagnosis not present

## 2020-11-14 DIAGNOSIS — I422 Other hypertrophic cardiomyopathy: Secondary | ICD-10-CM | POA: Diagnosis not present

## 2020-11-14 DIAGNOSIS — E785 Hyperlipidemia, unspecified: Secondary | ICD-10-CM | POA: Diagnosis not present

## 2020-11-14 DIAGNOSIS — Z9181 History of falling: Secondary | ICD-10-CM | POA: Diagnosis not present

## 2020-11-14 DIAGNOSIS — I69828 Other speech and language deficits following other cerebrovascular disease: Secondary | ICD-10-CM | POA: Diagnosis not present

## 2020-11-14 DIAGNOSIS — E1169 Type 2 diabetes mellitus with other specified complication: Secondary | ICD-10-CM | POA: Diagnosis not present

## 2020-11-14 DIAGNOSIS — M2550 Pain in unspecified joint: Secondary | ICD-10-CM | POA: Diagnosis not present

## 2020-11-14 DIAGNOSIS — I739 Peripheral vascular disease, unspecified: Secondary | ICD-10-CM | POA: Diagnosis not present

## 2020-11-14 DIAGNOSIS — I69398 Other sequelae of cerebral infarction: Secondary | ICD-10-CM | POA: Diagnosis not present

## 2020-11-14 DIAGNOSIS — Z8673 Personal history of transient ischemic attack (TIA), and cerebral infarction without residual deficits: Secondary | ICD-10-CM | POA: Diagnosis not present

## 2020-11-14 DIAGNOSIS — M6281 Muscle weakness (generalized): Secondary | ICD-10-CM | POA: Diagnosis not present

## 2020-11-14 DIAGNOSIS — J811 Chronic pulmonary edema: Secondary | ICD-10-CM | POA: Diagnosis not present

## 2020-11-14 DIAGNOSIS — R262 Difficulty in walking, not elsewhere classified: Secondary | ICD-10-CM | POA: Diagnosis not present

## 2020-11-14 DIAGNOSIS — R2681 Unsteadiness on feet: Secondary | ICD-10-CM | POA: Diagnosis not present

## 2020-11-14 DIAGNOSIS — E43 Unspecified severe protein-calorie malnutrition: Secondary | ICD-10-CM | POA: Diagnosis not present

## 2020-11-14 DIAGNOSIS — S43085D Other dislocation of left shoulder joint, subsequent encounter: Secondary | ICD-10-CM | POA: Diagnosis not present

## 2020-11-14 NOTE — Plan of Care (Signed)
?  Problem: Clinical Measurements: ?Goal: Will remain free from infection ?Outcome: Progressing ?Goal: Diagnostic test results will improve ?Outcome: Progressing ?Goal: Respiratory complications will improve ?Outcome: Progressing ?  ?

## 2020-11-14 NOTE — Plan of Care (Signed)
  Problem: Health Behavior/Discharge Planning: Goal: Ability to manage health-related needs will improve Outcome: Completed/Met   Problem: Clinical Measurements: Goal: Ability to maintain clinical measurements within normal limits will improve Outcome: Completed/Met Goal: Will remain free from infection 11/14/2020 0217 by Ashley Murrain, RN Outcome: Completed/Met 11/14/2020 0025 by Ashley Murrain, RN Outcome: Progressing Goal: Diagnostic test results will improve 11/14/2020 0217 by Ashley Murrain, RN Outcome: Completed/Met 11/14/2020 0025 by Ashley Murrain, RN Outcome: Progressing Goal: Respiratory complications will improve 11/14/2020 0217 by Ashley Murrain, RN Outcome: Completed/Met 11/14/2020 0025 by Ashley Murrain, RN Outcome: Progressing   Problem: Nutrition: Goal: Adequate nutrition will be maintained Outcome: Completed/Met   Problem: Coping: Goal: Level of anxiety will decrease Outcome: Completed/Met   Problem: Elimination: Goal: Will not experience complications related to bowel motility Outcome: Completed/Met Goal: Will not experience complications related to urinary retention Outcome: Completed/Met   Problem: Safety: Goal: Ability to remain free from injury will improve Outcome: Completed/Met   Problem: Skin Integrity: Goal: Risk for impaired skin integrity will decrease Outcome: Completed/Met   Problem: Education: Goal: Knowledge of the prescribed therapeutic regimen will improve Outcome: Completed/Met Goal: Understanding of activity limitations/precautions following surgery will improve Outcome: Completed/Met Goal: Individualized Educational Video(s) Outcome: Completed/Met   Problem: Activity: Goal: Ability to tolerate increased activity will improve Outcome: Completed/Met   Problem: Pain Management: Goal: Pain level will decrease with appropriate interventions Outcome: Completed/Met

## 2020-11-16 ENCOUNTER — Ambulatory Visit: Payer: Medicare Other | Admitting: General Practice

## 2020-11-16 ENCOUNTER — Ambulatory Visit: Payer: Medicare Other | Admitting: Internal Medicine

## 2020-11-19 ENCOUNTER — Ambulatory Visit: Payer: Medicare Other | Admitting: Internal Medicine

## 2020-11-22 DIAGNOSIS — Z8673 Personal history of transient ischemic attack (TIA), and cerebral infarction without residual deficits: Secondary | ICD-10-CM | POA: Diagnosis not present

## 2020-11-22 DIAGNOSIS — S4292XD Fracture of left shoulder girdle, part unspecified, subsequent encounter for fracture with routine healing: Secondary | ICD-10-CM | POA: Diagnosis not present

## 2020-11-22 DIAGNOSIS — R262 Difficulty in walking, not elsewhere classified: Secondary | ICD-10-CM | POA: Diagnosis not present

## 2020-11-22 DIAGNOSIS — S43085D Other dislocation of left shoulder joint, subsequent encounter: Secondary | ICD-10-CM | POA: Diagnosis not present

## 2020-11-26 ENCOUNTER — Other Ambulatory Visit: Payer: Self-pay | Admitting: Adult Health

## 2020-11-26 DIAGNOSIS — J449 Chronic obstructive pulmonary disease, unspecified: Secondary | ICD-10-CM

## 2020-11-29 DIAGNOSIS — E785 Hyperlipidemia, unspecified: Secondary | ICD-10-CM | POA: Diagnosis not present

## 2020-11-29 DIAGNOSIS — J449 Chronic obstructive pulmonary disease, unspecified: Secondary | ICD-10-CM | POA: Diagnosis not present

## 2020-11-29 DIAGNOSIS — R262 Difficulty in walking, not elsewhere classified: Secondary | ICD-10-CM | POA: Diagnosis not present

## 2020-11-29 DIAGNOSIS — E1169 Type 2 diabetes mellitus with other specified complication: Secondary | ICD-10-CM | POA: Diagnosis not present

## 2020-11-30 DIAGNOSIS — R2681 Unsteadiness on feet: Secondary | ICD-10-CM | POA: Diagnosis not present

## 2020-11-30 DIAGNOSIS — M6281 Muscle weakness (generalized): Secondary | ICD-10-CM | POA: Diagnosis not present

## 2020-11-30 DIAGNOSIS — Z9181 History of falling: Secondary | ICD-10-CM | POA: Diagnosis not present

## 2020-11-30 DIAGNOSIS — S43085D Other dislocation of left shoulder joint, subsequent encounter: Secondary | ICD-10-CM | POA: Diagnosis not present

## 2020-11-30 DIAGNOSIS — S4292XD Fracture of left shoulder girdle, part unspecified, subsequent encounter for fracture with routine healing: Secondary | ICD-10-CM | POA: Diagnosis not present

## 2020-12-01 ENCOUNTER — Other Ambulatory Visit: Payer: Self-pay | Admitting: Internal Medicine

## 2020-12-01 DIAGNOSIS — J449 Chronic obstructive pulmonary disease, unspecified: Secondary | ICD-10-CM

## 2020-12-04 ENCOUNTER — Ambulatory Visit: Payer: Medicare Other | Admitting: Internal Medicine

## 2020-12-04 DIAGNOSIS — F17210 Nicotine dependence, cigarettes, uncomplicated: Secondary | ICD-10-CM | POA: Diagnosis not present

## 2020-12-04 DIAGNOSIS — E1151 Type 2 diabetes mellitus with diabetic peripheral angiopathy without gangrene: Secondary | ICD-10-CM | POA: Diagnosis not present

## 2020-12-04 DIAGNOSIS — I2510 Atherosclerotic heart disease of native coronary artery without angina pectoris: Secondary | ICD-10-CM | POA: Diagnosis not present

## 2020-12-04 DIAGNOSIS — Z7982 Long term (current) use of aspirin: Secondary | ICD-10-CM | POA: Diagnosis not present

## 2020-12-04 DIAGNOSIS — S4292XD Fracture of left shoulder girdle, part unspecified, subsequent encounter for fracture with routine healing: Secondary | ICD-10-CM | POA: Diagnosis not present

## 2020-12-04 DIAGNOSIS — J449 Chronic obstructive pulmonary disease, unspecified: Secondary | ICD-10-CM | POA: Diagnosis not present

## 2020-12-04 DIAGNOSIS — J811 Chronic pulmonary edema: Secondary | ICD-10-CM | POA: Diagnosis not present

## 2020-12-04 DIAGNOSIS — Z8673 Personal history of transient ischemic attack (TIA), and cerebral infarction without residual deficits: Secondary | ICD-10-CM | POA: Diagnosis not present

## 2020-12-04 DIAGNOSIS — E785 Hyperlipidemia, unspecified: Secondary | ICD-10-CM | POA: Diagnosis not present

## 2020-12-04 DIAGNOSIS — Z853 Personal history of malignant neoplasm of breast: Secondary | ICD-10-CM | POA: Diagnosis not present

## 2020-12-04 DIAGNOSIS — I700 Atherosclerosis of aorta: Secondary | ICD-10-CM | POA: Diagnosis not present

## 2020-12-04 DIAGNOSIS — E43 Unspecified severe protein-calorie malnutrition: Secondary | ICD-10-CM | POA: Diagnosis not present

## 2020-12-04 DIAGNOSIS — S43085D Other dislocation of left shoulder joint, subsequent encounter: Secondary | ICD-10-CM | POA: Diagnosis not present

## 2020-12-04 DIAGNOSIS — I10 Essential (primary) hypertension: Secondary | ICD-10-CM | POA: Diagnosis not present

## 2020-12-04 DIAGNOSIS — Z9011 Acquired absence of right breast and nipple: Secondary | ICD-10-CM | POA: Diagnosis not present

## 2020-12-05 DIAGNOSIS — I700 Atherosclerosis of aorta: Secondary | ICD-10-CM | POA: Diagnosis not present

## 2020-12-05 DIAGNOSIS — J811 Chronic pulmonary edema: Secondary | ICD-10-CM | POA: Diagnosis not present

## 2020-12-05 DIAGNOSIS — I2510 Atherosclerotic heart disease of native coronary artery without angina pectoris: Secondary | ICD-10-CM | POA: Diagnosis not present

## 2020-12-05 DIAGNOSIS — E1151 Type 2 diabetes mellitus with diabetic peripheral angiopathy without gangrene: Secondary | ICD-10-CM | POA: Diagnosis not present

## 2020-12-05 DIAGNOSIS — Z8673 Personal history of transient ischemic attack (TIA), and cerebral infarction without residual deficits: Secondary | ICD-10-CM | POA: Diagnosis not present

## 2020-12-05 DIAGNOSIS — S4292XD Fracture of left shoulder girdle, part unspecified, subsequent encounter for fracture with routine healing: Secondary | ICD-10-CM | POA: Diagnosis not present

## 2020-12-05 DIAGNOSIS — S43085D Other dislocation of left shoulder joint, subsequent encounter: Secondary | ICD-10-CM | POA: Diagnosis not present

## 2020-12-05 DIAGNOSIS — Z9011 Acquired absence of right breast and nipple: Secondary | ICD-10-CM | POA: Diagnosis not present

## 2020-12-05 DIAGNOSIS — E43 Unspecified severe protein-calorie malnutrition: Secondary | ICD-10-CM | POA: Diagnosis not present

## 2020-12-05 DIAGNOSIS — I10 Essential (primary) hypertension: Secondary | ICD-10-CM | POA: Diagnosis not present

## 2020-12-05 DIAGNOSIS — J449 Chronic obstructive pulmonary disease, unspecified: Secondary | ICD-10-CM | POA: Diagnosis not present

## 2020-12-05 DIAGNOSIS — E785 Hyperlipidemia, unspecified: Secondary | ICD-10-CM | POA: Diagnosis not present

## 2020-12-05 DIAGNOSIS — Z853 Personal history of malignant neoplasm of breast: Secondary | ICD-10-CM | POA: Diagnosis not present

## 2020-12-05 DIAGNOSIS — F17210 Nicotine dependence, cigarettes, uncomplicated: Secondary | ICD-10-CM | POA: Diagnosis not present

## 2020-12-05 DIAGNOSIS — Z7982 Long term (current) use of aspirin: Secondary | ICD-10-CM | POA: Diagnosis not present

## 2020-12-06 ENCOUNTER — Telehealth: Payer: Self-pay | Admitting: Internal Medicine

## 2020-12-06 NOTE — Telephone Encounter (Signed)
Crystal Osborne from [facility] contacted [place] regarding pt frequency Ot- 1 week 4 Pt stated she diet control and HH would like to know if we are taking A1C labs and would like to know if its okay to except orders from pt orthopedic dr.  Please contact [phone] with any questions and voicemail can be left.

## 2020-12-06 NOTE — Telephone Encounter (Signed)
Spoke w/ Ashok Norris- verbal orders given for OT. Informed last A1c was 05/2020- at 5.7. Okay to accept orders from Dr. Tamera Punt at ortho as well.

## 2020-12-10 DIAGNOSIS — Z9889 Other specified postprocedural states: Secondary | ICD-10-CM | POA: Diagnosis not present

## 2020-12-11 ENCOUNTER — Encounter: Payer: Self-pay | Admitting: Family Medicine

## 2020-12-11 ENCOUNTER — Other Ambulatory Visit: Payer: Self-pay

## 2020-12-11 ENCOUNTER — Ambulatory Visit (INDEPENDENT_AMBULATORY_CARE_PROVIDER_SITE_OTHER): Payer: Medicare Other | Admitting: Family Medicine

## 2020-12-11 VITALS — BP 118/76 | HR 81 | Temp 98.1°F | Ht 65.0 in | Wt 102.1 lb

## 2020-12-11 DIAGNOSIS — Z853 Personal history of malignant neoplasm of breast: Secondary | ICD-10-CM | POA: Diagnosis not present

## 2020-12-11 DIAGNOSIS — E43 Unspecified severe protein-calorie malnutrition: Secondary | ICD-10-CM | POA: Diagnosis not present

## 2020-12-11 DIAGNOSIS — S43085D Other dislocation of left shoulder joint, subsequent encounter: Secondary | ICD-10-CM | POA: Diagnosis not present

## 2020-12-11 DIAGNOSIS — Z7982 Long term (current) use of aspirin: Secondary | ICD-10-CM | POA: Diagnosis not present

## 2020-12-11 DIAGNOSIS — E1151 Type 2 diabetes mellitus with diabetic peripheral angiopathy without gangrene: Secondary | ICD-10-CM | POA: Diagnosis not present

## 2020-12-11 DIAGNOSIS — R300 Dysuria: Secondary | ICD-10-CM | POA: Diagnosis not present

## 2020-12-11 DIAGNOSIS — I10 Essential (primary) hypertension: Secondary | ICD-10-CM | POA: Diagnosis not present

## 2020-12-11 DIAGNOSIS — S4292XD Fracture of left shoulder girdle, part unspecified, subsequent encounter for fracture with routine healing: Secondary | ICD-10-CM | POA: Diagnosis not present

## 2020-12-11 DIAGNOSIS — J811 Chronic pulmonary edema: Secondary | ICD-10-CM | POA: Diagnosis not present

## 2020-12-11 DIAGNOSIS — Z8673 Personal history of transient ischemic attack (TIA), and cerebral infarction without residual deficits: Secondary | ICD-10-CM | POA: Diagnosis not present

## 2020-12-11 DIAGNOSIS — I2510 Atherosclerotic heart disease of native coronary artery without angina pectoris: Secondary | ICD-10-CM | POA: Diagnosis not present

## 2020-12-11 DIAGNOSIS — M7989 Other specified soft tissue disorders: Secondary | ICD-10-CM

## 2020-12-11 DIAGNOSIS — E785 Hyperlipidemia, unspecified: Secondary | ICD-10-CM | POA: Diagnosis not present

## 2020-12-11 DIAGNOSIS — I700 Atherosclerosis of aorta: Secondary | ICD-10-CM | POA: Diagnosis not present

## 2020-12-11 DIAGNOSIS — Z9011 Acquired absence of right breast and nipple: Secondary | ICD-10-CM | POA: Diagnosis not present

## 2020-12-11 DIAGNOSIS — J449 Chronic obstructive pulmonary disease, unspecified: Secondary | ICD-10-CM | POA: Diagnosis not present

## 2020-12-11 DIAGNOSIS — F17210 Nicotine dependence, cigarettes, uncomplicated: Secondary | ICD-10-CM | POA: Diagnosis not present

## 2020-12-11 LAB — URINALYSIS, ROUTINE W REFLEX MICROSCOPIC
Bilirubin Urine: NEGATIVE
Ketones, ur: NEGATIVE
Nitrite: POSITIVE — AB
Specific Gravity, Urine: 1.01 (ref 1.000–1.030)
Urine Glucose: NEGATIVE
Urobilinogen, UA: 0.2 (ref 0.0–1.0)
pH: 7 (ref 5.0–8.0)

## 2020-12-11 MED ORDER — NITROFURANTOIN MONOHYD MACRO 100 MG PO CAPS: 100.0000 mg | ORAL_CAPSULE | Freq: Two times a day (BID) | ORAL | 0 refills | Status: DC

## 2020-12-11 NOTE — Patient Instructions (Addendum)
Stay hydrated.   Warning signs/symptoms: Uncontrollable nausea/vomiting, fevers, worsening symptoms despite treatment, confusion.  Give [place] around 2 business days to get culture back to you.  Elevate the arm and consider compression with an ACE bandage.   Let us know if you need anything.

## 2020-12-11 NOTE — Progress Notes (Signed)
Chief Complaint  Patient presents with   Arm Swelling    Legs swelling    Dysuria    Crystal Osborne is a 87 y.o. female here for possible UTI. Here w health aide who helps provide the hx.   Duration: 2 days. Symptoms: Dysuria, urinary frequency, urinary hesitancy, urinary retention, and urgency Denies: hematuria, fever, nausea, urinary incontinence, flank pain, vaginal discharge Hx of recurrent UTI? No  Swelling- Fell 1 mo ago and was in a sling. Took it out. swelling in LUE/forearm. No other inj. Went to ortho yesterday.  She had a negative x-ray of her left upper extremity.  Given tramadol, worried about Toprol interaction.  They are asking if I will prescribe something for pain.  She has an allergy to codeine.  Past Medical History:  Diagnosis Date   AAA (abdominal aortic aneurysm)    sees Dr Eden Lathe    B12 deficiency    Cancer of right breast [facility]) [place] 11/28/2008   sees Dr Oneida Alar    COPD (chronic obstructive pulmonary disease) ([place]) 01/25/2014   CVA (cerebrovascular accident) ([place]) 2006   Diabetes mellitus ([place]) 05/29/2010   A1c 6.0 on October 2011    Hyperlipidemia    Hypertension    Osteoporosis 2003   Pneumonia ~ 2014; ~ 2015   Prolapse of female bladder, acquired    Shingles    repeated episone 9/11 went to a UC   TIA (transient ischemic attack) ~ 2005   Transient hypothyroidism    d/t thyroidectomy   UTI (lower urinary tract infection) May 2013     BP 118/76   Pulse 81   Temp 98.1 F (36.7 C) (Oral)   Ht 5\' 5"  (1.651 m)   Wt 102 lb 2 oz (46.3 kg)   SpO2 95%   BMI 16.99 kg/m  General: Awake, alert, appears stated age Heart: RRR Lungs: CTAB, normal respiratory effort, no accessory muscle usage Abd: BS+, soft, NT, ND, no masses or organomegaly Neuro: DTR's equal and symmetric MSK: No CVA tenderness, neg Lloyd's sign; there is soft tissue swelling of the left posterior forearm with diffuse tenderness to palpation of the left upper  extremity; she has very poor active and passive range of motion at her elbow and shoulder Psych: Nml affect and mood  Dysuria - Plan: Urinalysis, Urine Culture  Swelling of forearm  Stay hydrated. Seek immediate care if pt starts to develop fevers, new/worsening symptoms, uncontrollable N/V. This is likely due to poor mobilization.  Given diffuse pain, unlikely to be clot.  She has an appointment with physical therapy today.  As she improves her range of motion, she will build to move and elevate better.  Could consider compression with Ace bandage though she would not likely tolerate high pressure. F/u as originally scheduled with her regular PCP. The patient and her aide voiced understanding and agreement to the plan.  I spent 35 minutes with the patient discussing the above in addition to reviewing her chart on the same day of the visit.  [place], DO 12/11/20 11:48 AM

## 2020-12-12 ENCOUNTER — Telehealth: Payer: Self-pay | Admitting: Internal Medicine

## 2020-12-12 ENCOUNTER — Ambulatory Visit: Payer: Medicare Other | Admitting: Medical

## 2020-12-12 DIAGNOSIS — J449 Chronic obstructive pulmonary disease, unspecified: Secondary | ICD-10-CM | POA: Diagnosis not present

## 2020-12-12 DIAGNOSIS — Z9011 Acquired absence of right breast and nipple: Secondary | ICD-10-CM | POA: Diagnosis not present

## 2020-12-12 DIAGNOSIS — I700 Atherosclerosis of aorta: Secondary | ICD-10-CM | POA: Diagnosis not present

## 2020-12-12 DIAGNOSIS — E43 Unspecified severe protein-calorie malnutrition: Secondary | ICD-10-CM | POA: Diagnosis not present

## 2020-12-12 DIAGNOSIS — Z853 Personal history of malignant neoplasm of breast: Secondary | ICD-10-CM | POA: Diagnosis not present

## 2020-12-12 DIAGNOSIS — S4292XD Fracture of left shoulder girdle, part unspecified, subsequent encounter for fracture with routine healing: Secondary | ICD-10-CM | POA: Diagnosis not present

## 2020-12-12 DIAGNOSIS — I2510 Atherosclerotic heart disease of native coronary artery without angina pectoris: Secondary | ICD-10-CM | POA: Diagnosis not present

## 2020-12-12 DIAGNOSIS — E785 Hyperlipidemia, unspecified: Secondary | ICD-10-CM | POA: Diagnosis not present

## 2020-12-12 DIAGNOSIS — Z7982 Long term (current) use of aspirin: Secondary | ICD-10-CM | POA: Diagnosis not present

## 2020-12-12 DIAGNOSIS — I10 Essential (primary) hypertension: Secondary | ICD-10-CM | POA: Diagnosis not present

## 2020-12-12 DIAGNOSIS — E1151 Type 2 diabetes mellitus with diabetic peripheral angiopathy without gangrene: Secondary | ICD-10-CM | POA: Diagnosis not present

## 2020-12-12 DIAGNOSIS — F17210 Nicotine dependence, cigarettes, uncomplicated: Secondary | ICD-10-CM | POA: Diagnosis not present

## 2020-12-12 DIAGNOSIS — Z8673 Personal history of transient ischemic attack (TIA), and cerebral infarction without residual deficits: Secondary | ICD-10-CM | POA: Diagnosis not present

## 2020-12-12 DIAGNOSIS — J811 Chronic pulmonary edema: Secondary | ICD-10-CM | POA: Diagnosis not present

## 2020-12-12 DIAGNOSIS — S43085D Other dislocation of left shoulder joint, subsequent encounter: Secondary | ICD-10-CM | POA: Diagnosis not present

## 2020-12-12 MED ORDER — CEPHALEXIN 500 MG PO CAPS: 500.0000 mg | ORAL_CAPSULE | Freq: Four times a day (QID) | ORAL | 0 refills | Status: DC

## 2020-12-12 NOTE — Telephone Encounter (Signed)
Family informed.

## 2020-12-12 NOTE — Telephone Encounter (Signed)
Pt. Private sitter called and stated antibiotics given yesterday are causing the pt to be very nauseous.

## 2020-12-12 NOTE — Telephone Encounter (Signed)
Alt sent.  

## 2020-12-14 ENCOUNTER — Other Ambulatory Visit: Payer: Self-pay | Admitting: Family Medicine

## 2020-12-14 LAB — URINE CULTURE
MICRO NUMBER:: 12577733
SPECIMEN QUALITY:: ADEQUATE

## 2020-12-14 MED ORDER — CEFDINIR 300 MG PO CAPS: 300.0000 mg | ORAL_CAPSULE | Freq: Two times a day (BID) | ORAL | 0 refills | Status: AC

## 2020-12-17 ENCOUNTER — Encounter: Payer: Self-pay | Admitting: Internal Medicine

## 2020-12-17 ENCOUNTER — Telehealth: Payer: Self-pay | Admitting: Internal Medicine

## 2020-12-17 NOTE — Telephone Encounter (Signed)
HH called to see if we received and faxed new orders. Please advise. Vania Rea can be reached at [phone]

## 2020-12-17 NOTE — Telephone Encounter (Signed)
Plan of care received on 12/14/20- it is in PCP's red folder to be signed. Informed Vania Rea that orders are waiting to be signed by PCP. She verbalized understanding.

## 2020-12-18 DIAGNOSIS — S43085D Other dislocation of left shoulder joint, subsequent encounter: Secondary | ICD-10-CM | POA: Diagnosis not present

## 2020-12-18 DIAGNOSIS — I700 Atherosclerosis of aorta: Secondary | ICD-10-CM | POA: Diagnosis not present

## 2020-12-18 DIAGNOSIS — S4292XD Fracture of left shoulder girdle, part unspecified, subsequent encounter for fracture with routine healing: Secondary | ICD-10-CM | POA: Diagnosis not present

## 2020-12-18 DIAGNOSIS — Z7982 Long term (current) use of aspirin: Secondary | ICD-10-CM | POA: Diagnosis not present

## 2020-12-18 DIAGNOSIS — J811 Chronic pulmonary edema: Secondary | ICD-10-CM | POA: Diagnosis not present

## 2020-12-18 DIAGNOSIS — F17210 Nicotine dependence, cigarettes, uncomplicated: Secondary | ICD-10-CM | POA: Diagnosis not present

## 2020-12-18 DIAGNOSIS — Z9011 Acquired absence of right breast and nipple: Secondary | ICD-10-CM | POA: Diagnosis not present

## 2020-12-18 DIAGNOSIS — I10 Essential (primary) hypertension: Secondary | ICD-10-CM | POA: Diagnosis not present

## 2020-12-18 DIAGNOSIS — Z853 Personal history of malignant neoplasm of breast: Secondary | ICD-10-CM | POA: Diagnosis not present

## 2020-12-18 DIAGNOSIS — E785 Hyperlipidemia, unspecified: Secondary | ICD-10-CM | POA: Diagnosis not present

## 2020-12-18 DIAGNOSIS — E1151 Type 2 diabetes mellitus with diabetic peripheral angiopathy without gangrene: Secondary | ICD-10-CM | POA: Diagnosis not present

## 2020-12-18 DIAGNOSIS — Z8673 Personal history of transient ischemic attack (TIA), and cerebral infarction without residual deficits: Secondary | ICD-10-CM | POA: Diagnosis not present

## 2020-12-18 DIAGNOSIS — I2510 Atherosclerotic heart disease of native coronary artery without angina pectoris: Secondary | ICD-10-CM | POA: Diagnosis not present

## 2020-12-18 DIAGNOSIS — E43 Unspecified severe protein-calorie malnutrition: Secondary | ICD-10-CM | POA: Diagnosis not present

## 2020-12-18 DIAGNOSIS — J449 Chronic obstructive pulmonary disease, unspecified: Secondary | ICD-10-CM | POA: Diagnosis not present

## 2020-12-20 DIAGNOSIS — I700 Atherosclerosis of aorta: Secondary | ICD-10-CM | POA: Diagnosis not present

## 2020-12-20 DIAGNOSIS — E1151 Type 2 diabetes mellitus with diabetic peripheral angiopathy without gangrene: Secondary | ICD-10-CM | POA: Diagnosis not present

## 2020-12-20 DIAGNOSIS — E43 Unspecified severe protein-calorie malnutrition: Secondary | ICD-10-CM | POA: Diagnosis not present

## 2020-12-20 DIAGNOSIS — S43085D Other dislocation of left shoulder joint, subsequent encounter: Secondary | ICD-10-CM | POA: Diagnosis not present

## 2020-12-20 DIAGNOSIS — I2510 Atherosclerotic heart disease of native coronary artery without angina pectoris: Secondary | ICD-10-CM | POA: Diagnosis not present

## 2020-12-20 DIAGNOSIS — Z7982 Long term (current) use of aspirin: Secondary | ICD-10-CM | POA: Diagnosis not present

## 2020-12-20 DIAGNOSIS — Z8673 Personal history of transient ischemic attack (TIA), and cerebral infarction without residual deficits: Secondary | ICD-10-CM | POA: Diagnosis not present

## 2020-12-20 DIAGNOSIS — S4292XD Fracture of left shoulder girdle, part unspecified, subsequent encounter for fracture with routine healing: Secondary | ICD-10-CM | POA: Diagnosis not present

## 2020-12-20 DIAGNOSIS — J811 Chronic pulmonary edema: Secondary | ICD-10-CM | POA: Diagnosis not present

## 2020-12-20 DIAGNOSIS — Z9011 Acquired absence of right breast and nipple: Secondary | ICD-10-CM | POA: Diagnosis not present

## 2020-12-20 DIAGNOSIS — J449 Chronic obstructive pulmonary disease, unspecified: Secondary | ICD-10-CM | POA: Diagnosis not present

## 2020-12-20 DIAGNOSIS — E785 Hyperlipidemia, unspecified: Secondary | ICD-10-CM | POA: Diagnosis not present

## 2020-12-20 DIAGNOSIS — F17210 Nicotine dependence, cigarettes, uncomplicated: Secondary | ICD-10-CM | POA: Diagnosis not present

## 2020-12-20 DIAGNOSIS — I10 Essential (primary) hypertension: Secondary | ICD-10-CM | POA: Diagnosis not present

## 2020-12-20 DIAGNOSIS — Z853 Personal history of malignant neoplasm of breast: Secondary | ICD-10-CM | POA: Diagnosis not present

## 2020-12-24 DIAGNOSIS — Z8673 Personal history of transient ischemic attack (TIA), and cerebral infarction without residual deficits: Secondary | ICD-10-CM | POA: Diagnosis not present

## 2020-12-24 DIAGNOSIS — J449 Chronic obstructive pulmonary disease, unspecified: Secondary | ICD-10-CM | POA: Diagnosis not present

## 2020-12-24 DIAGNOSIS — E43 Unspecified severe protein-calorie malnutrition: Secondary | ICD-10-CM | POA: Diagnosis not present

## 2020-12-24 DIAGNOSIS — I10 Essential (primary) hypertension: Secondary | ICD-10-CM | POA: Diagnosis not present

## 2020-12-24 DIAGNOSIS — E1151 Type 2 diabetes mellitus with diabetic peripheral angiopathy without gangrene: Secondary | ICD-10-CM | POA: Diagnosis not present

## 2020-12-24 DIAGNOSIS — S4292XD Fracture of left shoulder girdle, part unspecified, subsequent encounter for fracture with routine healing: Secondary | ICD-10-CM | POA: Diagnosis not present

## 2020-12-24 DIAGNOSIS — I700 Atherosclerosis of aorta: Secondary | ICD-10-CM | POA: Diagnosis not present

## 2020-12-24 DIAGNOSIS — E785 Hyperlipidemia, unspecified: Secondary | ICD-10-CM | POA: Diagnosis not present

## 2020-12-24 DIAGNOSIS — Z853 Personal history of malignant neoplasm of breast: Secondary | ICD-10-CM | POA: Diagnosis not present

## 2020-12-24 DIAGNOSIS — F17210 Nicotine dependence, cigarettes, uncomplicated: Secondary | ICD-10-CM | POA: Diagnosis not present

## 2020-12-24 DIAGNOSIS — J811 Chronic pulmonary edema: Secondary | ICD-10-CM | POA: Diagnosis not present

## 2020-12-24 DIAGNOSIS — Z9011 Acquired absence of right breast and nipple: Secondary | ICD-10-CM | POA: Diagnosis not present

## 2020-12-24 DIAGNOSIS — I2510 Atherosclerotic heart disease of native coronary artery without angina pectoris: Secondary | ICD-10-CM | POA: Diagnosis not present

## 2020-12-24 DIAGNOSIS — Z7982 Long term (current) use of aspirin: Secondary | ICD-10-CM | POA: Diagnosis not present

## 2020-12-24 DIAGNOSIS — S43085D Other dislocation of left shoulder joint, subsequent encounter: Secondary | ICD-10-CM | POA: Diagnosis not present

## 2020-12-24 NOTE — Telephone Encounter (Signed)
Vania Rea called back to get an update on the paperwork for the patient. Please advice.

## 2020-12-24 NOTE — Telephone Encounter (Signed)
Spoke w/ Crystal Osborne- informed paperwork was faxed last week.

## 2020-12-25 DIAGNOSIS — J449 Chronic obstructive pulmonary disease, unspecified: Secondary | ICD-10-CM | POA: Diagnosis not present

## 2020-12-25 DIAGNOSIS — E785 Hyperlipidemia, unspecified: Secondary | ICD-10-CM | POA: Diagnosis not present

## 2020-12-25 DIAGNOSIS — Z853 Personal history of malignant neoplasm of breast: Secondary | ICD-10-CM | POA: Diagnosis not present

## 2020-12-25 DIAGNOSIS — S4292XD Fracture of left shoulder girdle, part unspecified, subsequent encounter for fracture with routine healing: Secondary | ICD-10-CM | POA: Diagnosis not present

## 2020-12-25 DIAGNOSIS — Z7982 Long term (current) use of aspirin: Secondary | ICD-10-CM | POA: Diagnosis not present

## 2020-12-25 DIAGNOSIS — J811 Chronic pulmonary edema: Secondary | ICD-10-CM | POA: Diagnosis not present

## 2020-12-25 DIAGNOSIS — S43085D Other dislocation of left shoulder joint, subsequent encounter: Secondary | ICD-10-CM | POA: Diagnosis not present

## 2020-12-25 DIAGNOSIS — E43 Unspecified severe protein-calorie malnutrition: Secondary | ICD-10-CM | POA: Diagnosis not present

## 2020-12-25 DIAGNOSIS — Z8673 Personal history of transient ischemic attack (TIA), and cerebral infarction without residual deficits: Secondary | ICD-10-CM | POA: Diagnosis not present

## 2020-12-25 DIAGNOSIS — F17210 Nicotine dependence, cigarettes, uncomplicated: Secondary | ICD-10-CM | POA: Diagnosis not present

## 2020-12-25 DIAGNOSIS — E1151 Type 2 diabetes mellitus with diabetic peripheral angiopathy without gangrene: Secondary | ICD-10-CM | POA: Diagnosis not present

## 2020-12-25 DIAGNOSIS — I10 Essential (primary) hypertension: Secondary | ICD-10-CM | POA: Diagnosis not present

## 2020-12-25 DIAGNOSIS — Z9011 Acquired absence of right breast and nipple: Secondary | ICD-10-CM | POA: Diagnosis not present

## 2020-12-25 DIAGNOSIS — I2510 Atherosclerotic heart disease of native coronary artery without angina pectoris: Secondary | ICD-10-CM | POA: Diagnosis not present

## 2020-12-25 DIAGNOSIS — I700 Atherosclerosis of aorta: Secondary | ICD-10-CM | POA: Diagnosis not present

## 2020-12-27 DIAGNOSIS — J449 Chronic obstructive pulmonary disease, unspecified: Secondary | ICD-10-CM | POA: Diagnosis not present

## 2020-12-27 DIAGNOSIS — S43085D Other dislocation of left shoulder joint, subsequent encounter: Secondary | ICD-10-CM | POA: Diagnosis not present

## 2020-12-27 DIAGNOSIS — E1151 Type 2 diabetes mellitus with diabetic peripheral angiopathy without gangrene: Secondary | ICD-10-CM | POA: Diagnosis not present

## 2020-12-27 DIAGNOSIS — I2510 Atherosclerotic heart disease of native coronary artery without angina pectoris: Secondary | ICD-10-CM | POA: Diagnosis not present

## 2020-12-27 DIAGNOSIS — Z9011 Acquired absence of right breast and nipple: Secondary | ICD-10-CM | POA: Diagnosis not present

## 2020-12-27 DIAGNOSIS — I700 Atherosclerosis of aorta: Secondary | ICD-10-CM | POA: Diagnosis not present

## 2020-12-27 DIAGNOSIS — E43 Unspecified severe protein-calorie malnutrition: Secondary | ICD-10-CM | POA: Diagnosis not present

## 2020-12-27 DIAGNOSIS — F17210 Nicotine dependence, cigarettes, uncomplicated: Secondary | ICD-10-CM | POA: Diagnosis not present

## 2020-12-27 DIAGNOSIS — Z853 Personal history of malignant neoplasm of breast: Secondary | ICD-10-CM | POA: Diagnosis not present

## 2020-12-27 DIAGNOSIS — J811 Chronic pulmonary edema: Secondary | ICD-10-CM | POA: Diagnosis not present

## 2020-12-27 DIAGNOSIS — E785 Hyperlipidemia, unspecified: Secondary | ICD-10-CM | POA: Diagnosis not present

## 2020-12-27 DIAGNOSIS — S4292XD Fracture of left shoulder girdle, part unspecified, subsequent encounter for fracture with routine healing: Secondary | ICD-10-CM | POA: Diagnosis not present

## 2020-12-27 DIAGNOSIS — Z7982 Long term (current) use of aspirin: Secondary | ICD-10-CM | POA: Diagnosis not present

## 2020-12-27 DIAGNOSIS — I10 Essential (primary) hypertension: Secondary | ICD-10-CM | POA: Diagnosis not present

## 2020-12-27 DIAGNOSIS — Z8673 Personal history of transient ischemic attack (TIA), and cerebral infarction without residual deficits: Secondary | ICD-10-CM | POA: Diagnosis not present

## 2020-12-30 DIAGNOSIS — I2510 Atherosclerotic heart disease of native coronary artery without angina pectoris: Secondary | ICD-10-CM | POA: Diagnosis not present

## 2020-12-30 DIAGNOSIS — I10 Essential (primary) hypertension: Secondary | ICD-10-CM | POA: Diagnosis not present

## 2020-12-30 DIAGNOSIS — Z8673 Personal history of transient ischemic attack (TIA), and cerebral infarction without residual deficits: Secondary | ICD-10-CM | POA: Diagnosis not present

## 2020-12-30 DIAGNOSIS — E1151 Type 2 diabetes mellitus with diabetic peripheral angiopathy without gangrene: Secondary | ICD-10-CM | POA: Diagnosis not present

## 2020-12-30 DIAGNOSIS — Z9011 Acquired absence of right breast and nipple: Secondary | ICD-10-CM | POA: Diagnosis not present

## 2020-12-30 DIAGNOSIS — S4292XD Fracture of left shoulder girdle, part unspecified, subsequent encounter for fracture with routine healing: Secondary | ICD-10-CM | POA: Diagnosis not present

## 2020-12-30 DIAGNOSIS — J449 Chronic obstructive pulmonary disease, unspecified: Secondary | ICD-10-CM | POA: Diagnosis not present

## 2020-12-30 DIAGNOSIS — Z853 Personal history of malignant neoplasm of breast: Secondary | ICD-10-CM | POA: Diagnosis not present

## 2020-12-30 DIAGNOSIS — J811 Chronic pulmonary edema: Secondary | ICD-10-CM | POA: Diagnosis not present

## 2020-12-30 DIAGNOSIS — E785 Hyperlipidemia, unspecified: Secondary | ICD-10-CM | POA: Diagnosis not present

## 2020-12-30 DIAGNOSIS — E43 Unspecified severe protein-calorie malnutrition: Secondary | ICD-10-CM | POA: Diagnosis not present

## 2020-12-30 DIAGNOSIS — I700 Atherosclerosis of aorta: Secondary | ICD-10-CM | POA: Diagnosis not present

## 2020-12-30 DIAGNOSIS — S43085D Other dislocation of left shoulder joint, subsequent encounter: Secondary | ICD-10-CM | POA: Diagnosis not present

## 2020-12-30 DIAGNOSIS — Z7982 Long term (current) use of aspirin: Secondary | ICD-10-CM | POA: Diagnosis not present

## 2020-12-30 DIAGNOSIS — F17210 Nicotine dependence, cigarettes, uncomplicated: Secondary | ICD-10-CM | POA: Diagnosis not present

## 2020-12-31 ENCOUNTER — Encounter: Payer: Self-pay | Admitting: Internal Medicine

## 2020-12-31 ENCOUNTER — Other Ambulatory Visit: Payer: Self-pay

## 2020-12-31 ENCOUNTER — Ambulatory Visit (INDEPENDENT_AMBULATORY_CARE_PROVIDER_SITE_OTHER): Payer: Medicare Other | Admitting: Internal Medicine

## 2020-12-31 VITALS — BP 116/68 | HR 87 | Temp 98.0°F | Resp 18 | Ht 65.0 in | Wt 102.1 lb

## 2020-12-31 DIAGNOSIS — S43085D Other dislocation of left shoulder joint, subsequent encounter: Secondary | ICD-10-CM | POA: Diagnosis not present

## 2020-12-31 DIAGNOSIS — M6281 Muscle weakness (generalized): Secondary | ICD-10-CM | POA: Diagnosis not present

## 2020-12-31 DIAGNOSIS — S4292XS Fracture of left shoulder girdle, part unspecified, sequela: Secondary | ICD-10-CM | POA: Diagnosis not present

## 2020-12-31 DIAGNOSIS — Z7409 Other reduced mobility: Secondary | ICD-10-CM | POA: Diagnosis not present

## 2020-12-31 DIAGNOSIS — Z8673 Personal history of transient ischemic attack (TIA), and cerebral infarction without residual deficits: Secondary | ICD-10-CM | POA: Diagnosis not present

## 2020-12-31 DIAGNOSIS — Z853 Personal history of malignant neoplasm of breast: Secondary | ICD-10-CM | POA: Diagnosis not present

## 2020-12-31 DIAGNOSIS — F17210 Nicotine dependence, cigarettes, uncomplicated: Secondary | ICD-10-CM | POA: Diagnosis not present

## 2020-12-31 DIAGNOSIS — J449 Chronic obstructive pulmonary disease, unspecified: Secondary | ICD-10-CM | POA: Diagnosis not present

## 2020-12-31 DIAGNOSIS — Z9181 History of falling: Secondary | ICD-10-CM | POA: Diagnosis not present

## 2020-12-31 DIAGNOSIS — Z7982 Long term (current) use of aspirin: Secondary | ICD-10-CM | POA: Diagnosis not present

## 2020-12-31 DIAGNOSIS — R2681 Unsteadiness on feet: Secondary | ICD-10-CM | POA: Diagnosis not present

## 2020-12-31 DIAGNOSIS — E785 Hyperlipidemia, unspecified: Secondary | ICD-10-CM | POA: Diagnosis not present

## 2020-12-31 DIAGNOSIS — J811 Chronic pulmonary edema: Secondary | ICD-10-CM | POA: Diagnosis not present

## 2020-12-31 DIAGNOSIS — Z9011 Acquired absence of right breast and nipple: Secondary | ICD-10-CM | POA: Diagnosis not present

## 2020-12-31 DIAGNOSIS — L89309 Pressure ulcer of unspecified buttock, unspecified stage: Secondary | ICD-10-CM | POA: Diagnosis not present

## 2020-12-31 DIAGNOSIS — I10 Essential (primary) hypertension: Secondary | ICD-10-CM | POA: Diagnosis not present

## 2020-12-31 DIAGNOSIS — E43 Unspecified severe protein-calorie malnutrition: Secondary | ICD-10-CM | POA: Diagnosis not present

## 2020-12-31 DIAGNOSIS — E1151 Type 2 diabetes mellitus with diabetic peripheral angiopathy without gangrene: Secondary | ICD-10-CM | POA: Diagnosis not present

## 2020-12-31 DIAGNOSIS — S4292XD Fracture of left shoulder girdle, part unspecified, subsequent encounter for fracture with routine healing: Secondary | ICD-10-CM | POA: Diagnosis not present

## 2020-12-31 DIAGNOSIS — I700 Atherosclerosis of aorta: Secondary | ICD-10-CM | POA: Diagnosis not present

## 2020-12-31 DIAGNOSIS — I2510 Atherosclerotic heart disease of native coronary artery without angina pectoris: Secondary | ICD-10-CM | POA: Diagnosis not present

## 2020-12-31 NOTE — Patient Instructions (Signed)
For shoulder pain: Continue Tylenol Okay to get Voltaren gel OTC and apply 3 times a day as needed.  If the pressure ulcer is not getting better, please reach out to this office.  GO TO THE FRONT DESK, PLEASE SCHEDULE YOUR APPOINTMENTS Come back for   a checkup in 3 months

## 2020-12-31 NOTE — Assessment & Plan Note (Signed)
Assessment  Prediabetes HTN Hyperlipidemia Insomnia-- rarely has xanax  COPD: smoker, XR suggestive of dx, no PFTs Osteoporosis 2003-- refused dexas consistently  B12 def (185 05-2013) CV: ---CVA 2006 ---Carotid artery dz--last [place] 09-2011 40-59% B- ICA, declined further testing ---AAA --last [place]  05-2015, 1 year -- PVD, + ABIs 10-2015, 11-2015: B iliac stents Shingles 10-2009 and ~ 07-2015 (seen elsewhere, L face) Status post thyroidectomy, transient hypothyroidism Breast cancer 1960 Allergies- hives sometimes , saw derm    PLAN Chart Review: -Admitted to the hospital, discharged 10/19/2020. DX pneumonia, DC to SNF -Admitted and discharged 11/13/2020: Dx fall, L shoulder fracture, dislocation, s/p closed reduction.  Discharged to SNF -UTI Dx 12/11/2020, Urine culture showed Klebsiella, Morganella, Rx Omnicef. Recent pneumonia: Currently asymptomatic L shoulder fracture: Still having pain, request my advice, recommend Tylenol and Voltaren gel. Pressure ulcer: Paperwork for a bed overlay provided, will talk about a donut pillow and frequent turning.  She is taking care off by private duty nurses, she has good coverings on top of the pressure ulcer. Advised to let me know if the area gets worse.  Wound care center?[facility] Social: Currently at home, has somebody with her 18 hours a day. Needs help cooking and getting showers. Preventive care: Recommend a flu shot: Declined

## 2020-12-31 NOTE — Progress Notes (Signed)
Subjective:    Patient ID: Crystal Osborne, female    DOB: [birth year], 87 y.o.   MRN: [id number]  DOS:  12/31/2020 Type of visit - description: Acute  Since the last office visit, had 2 hospital admissions, went to SNF twice.  Chart reviewed Also diagnosed with a UTI recently.  The reason for this visit: Has bedsore on the buttock for the last 4 to 6 weeks, is not getting better. Denies any discharge, fever or chills.  Having cramps on the R leg for the last 2 nights, from the knee down. Mild swelling but it is bilateral and symmetric.  No LE rash.  Rash of the hands, better with an antifungal.   Review of Systems At this point denies fever chills. No cough, no chest congestion. Denies any dysuria, gross hematuria or difficulty urinating  Past Medical History:  Diagnosis Date   AAA (abdominal aortic aneurysm)    sees Dr Eden Lathe    B12 deficiency    Cancer of right breast [facility]) [place] 11/28/2008   sees Dr Oneida Alar    COPD (chronic obstructive pulmonary disease) ([place]) 01/25/2014   CVA (cerebrovascular accident) ([place]) 2006   Diabetes mellitus ([place]) 05/29/2010   A1c 6.0 on October 2011    Hyperlipidemia    Hypertension    Osteoporosis 2003   Pneumonia ~ 2014; ~ 2015   Prolapse of female bladder, acquired    Shingles    repeated episone 9/11 went to a UC   TIA (transient ischemic attack) ~ 2005   Transient hypothyroidism    d/t thyroidectomy   UTI (lower urinary tract infection) May 2013    Past Surgical History:  Procedure Laterality Date   ABDOMINAL HYSTERECTOMY     no oophorectomy   CATARACT EXTRACTION W/ INTRAOCULAR LENS  IMPLANT, BILATERAL Bilateral    DILATION AND CURETTAGE OF UTERUS     MASTECTOMY, RADICAL Right 1960s   remotely    PERIPHERAL VASCULAR CATHETERIZATION  12/03/2015   PERIPHERAL VASCULAR CATHETERIZATION N/A 12/03/2015   Procedure: Abdominal Aortogram;  Surgeon: Lorretta Harp, MD;  Location: [place] [facility];  Service:  Cardiovascular;  Laterality: N/A;   PERIPHERAL VASCULAR CATHETERIZATION Bilateral 12/03/2015   Procedure: Lower Extremity Angiography;  Surgeon: Lorretta Harp, MD;  Location: [place] [facility];  Service: Cardiovascular;  Laterality: Bilateral;   PERIPHERAL VASCULAR CATHETERIZATION Bilateral 12/03/2015   Procedure: Peripheral Vascular Intervention;  Surgeon: Lorretta Harp, MD;  Location: [place] [facility];  Service: Cardiovascular;  Laterality: Bilateral;  67mmx58mm Lifestream bilateral Common Iliac Arteries   PERIPHERAL VASCULAR CATHETERIZATION Bilateral 12/03/2015   Procedure: Peripheral Vascular Atherectomy;  Surgeon: Lorretta Harp, MD;  Location: [place] [facility];  Service: Cardiovascular;  Laterality: Bilateral;  Common Iliac Arteries   SHOULDER CLOSED REDUCTION Left 11/08/2020   Procedure: CLOSED MANIPULATION SHOULDER;  Surgeon: Tania Ade, MD;  Location: [facility];  Service: Orthopedics;  Laterality: Left;   THYROIDECTOMY      Allergies as of 12/31/2020       Reactions   Septra [sulfamethoxazole-trimethoprim] Itching   Pt states she had a severe allergic reaction; she was in the ICU for 2 days because of taking this.   Tramadol    Unsure of reaction- was given during surgery?    Ciprofloxacin Hcl Itching   Codeine Rash   Made her feel very strange        Medication List        Accurate as  of December 31, 2020  8:45 PM. If you have any questions, ask your nurse or doctor.          acetaminophen 500 MG tablet Commonly known as: TYLENOL Take 2 tablets (1,000 mg total) by mouth every 6 (six) hours as needed (for pain).   albuterol 108 (90 Base) MCG/ACT inhaler Commonly known as: VENTOLIN HFA INHALE 2 PUFFS INTO THE LUNGS EVERY 6 HOURS AS NEEDED FOR WHEEZING OR SHORTNESS OF BREATH   aspirin EC 81 MG tablet Take 81 mg by mouth every evening.   atorvastatin 40 MG tablet Commonly known as: LIPITOR TAKE 1 TABLET(40 MG) BY MOUTH DAILY What changed:  how  much to take how to take this when to take this additional instructions   hydroxypropyl methylcellulose / hypromellose 2.5 % ophthalmic solution Commonly known as: ISOPTO TEARS / GONIOVISC Place 1 drop into both eyes 2 (two) times daily as needed for dry eyes.   METAMUCIL FIBER PO Take 1 packet by mouth daily.   metoprolol succinate 25 MG 24 hr tablet Commonly known as: Toprol XL Take 0.5 tablets (12.5 mg total) by mouth daily. What changed: when to take this   MILK OF MAGNESIA PO Take 1 Dose by mouth daily as needed (constipation).   RA SALINE ENEMA RE Place 1 Dose rectally daily as needed (constipation).   vitamin B-12 1000 MCG tablet Commonly known as: CYANOCOBALAMIN Take 1,000 mcg by mouth daily.               Durable Medical Equipment  (From admission, onward)           Start     Ordered   12/31/20 0000  For home use only DME Air overlay mattress        12/31/20 1018               Objective:   Physical Exam Skin:       BP 116/68 (BP Location: Left Leg, Patient Position: Sitting, Cuff Size: Small)   Pulse 87   Temp 98 F (36.7 C) (Oral)   Resp 18   Ht 5\' 5"  (1.651 m)   Wt 102 lb 2 oz (46.3 kg)   SpO2 93%   BMI 16.99 kg/m  General:   Elderly lady, underweight appearing, in no physical or emotional distress  HEENT:  Normocephalic . Face symmetric, atraumatic Lungs:  decreased breath sounds bilaterally Normal respiratory effort, no intercostal retractions, no accessory muscle use. Heart: RRR,  no murmur.  Lower extremities: Trace B periaknle edema, calves symmetric and nontender.  R toes with good capillary refills. Skin: Not pale. Not jaundice Neurologic:  alert & oriented X3.  Speech normal, gait not tested  Psych--  Cognition and judgment appear intact.  Cooperative with normal attention span and concentration.  Behavior appropriate. No anxious or depressed appearing.      Assessment       Assessment   Prediabetes HTN Hyperlipidemia Insomnia-- rarely has xanax  COPD: smoker, XR suggestive of dx, no PFTs Osteoporosis 2003-- refused dexas consistently  B12 def (185 05-2013) CV: ---CVA 2006 ---Carotid artery dz--last [place] 09-2011 40-59% B- ICA, declined further testing ---AAA --last [place]  05-2015, 1 year -- PVD, + ABIs 10-2015, 11-2015: B iliac stents Shingles 10-2009 and ~ 07-2015 (seen elsewhere, L face) Status post thyroidectomy, transient hypothyroidism Breast cancer 1960 Allergies- hives sometimes , saw derm    PLAN Chart Review: -Admitted to the hospital, discharged 10/19/2020. DX pneumonia, DC to SNF -Admitted and  discharged 11/13/2020: Dx fall, L shoulder fracture, dislocation, s/p closed reduction.  Discharged to SNF -UTI Dx 12/11/2020, Urine culture showed Klebsiella, Morganella, Rx Omnicef. Recent pneumonia: Currently asymptomatic L shoulder fracture: Still having pain, request my advice, recommend Tylenol and Voltaren gel. Pressure ulcer: Paperwork for a bed overlay provided, will talk about a donut pillow and frequent turning.  She is taking care off by private duty nurses, she has good coverings on top of the pressure ulcer. Advised to let me know if the area gets worse.  Wound care center?[facility] Social: Currently at home, has somebody with her 18 hours a day. Needs help cooking and getting showers. Preventive care: Recommend a flu shot: Declined   Time spent 32 minutes, extensive chart review, multiple questions answered regards pressure ulcer management.  This visit occurred during the SARS-CoV-2 public health emergency.  Safety protocols were in place, including screening questions prior to the visit, additional usage of staff PPE, and extensive cleaning of exam room while observing appropriate contact time as indicated for disinfecting solutions.

## 2021-01-04 DIAGNOSIS — E785 Hyperlipidemia, unspecified: Secondary | ICD-10-CM | POA: Diagnosis not present

## 2021-01-04 DIAGNOSIS — E1151 Type 2 diabetes mellitus with diabetic peripheral angiopathy without gangrene: Secondary | ICD-10-CM | POA: Diagnosis not present

## 2021-01-04 DIAGNOSIS — Z853 Personal history of malignant neoplasm of breast: Secondary | ICD-10-CM | POA: Diagnosis not present

## 2021-01-04 DIAGNOSIS — S4292XD Fracture of left shoulder girdle, part unspecified, subsequent encounter for fracture with routine healing: Secondary | ICD-10-CM | POA: Diagnosis not present

## 2021-01-04 DIAGNOSIS — F17210 Nicotine dependence, cigarettes, uncomplicated: Secondary | ICD-10-CM | POA: Diagnosis not present

## 2021-01-04 DIAGNOSIS — Z8673 Personal history of transient ischemic attack (TIA), and cerebral infarction without residual deficits: Secondary | ICD-10-CM | POA: Diagnosis not present

## 2021-01-04 DIAGNOSIS — J449 Chronic obstructive pulmonary disease, unspecified: Secondary | ICD-10-CM | POA: Diagnosis not present

## 2021-01-04 DIAGNOSIS — I10 Essential (primary) hypertension: Secondary | ICD-10-CM | POA: Diagnosis not present

## 2021-01-04 DIAGNOSIS — J811 Chronic pulmonary edema: Secondary | ICD-10-CM | POA: Diagnosis not present

## 2021-01-04 DIAGNOSIS — Z9011 Acquired absence of right breast and nipple: Secondary | ICD-10-CM | POA: Diagnosis not present

## 2021-01-04 DIAGNOSIS — Z7982 Long term (current) use of aspirin: Secondary | ICD-10-CM | POA: Diagnosis not present

## 2021-01-04 DIAGNOSIS — S43085D Other dislocation of left shoulder joint, subsequent encounter: Secondary | ICD-10-CM | POA: Diagnosis not present

## 2021-01-04 DIAGNOSIS — I2510 Atherosclerotic heart disease of native coronary artery without angina pectoris: Secondary | ICD-10-CM | POA: Diagnosis not present

## 2021-01-04 DIAGNOSIS — I700 Atherosclerosis of aorta: Secondary | ICD-10-CM | POA: Diagnosis not present

## 2021-01-04 DIAGNOSIS — E43 Unspecified severe protein-calorie malnutrition: Secondary | ICD-10-CM | POA: Diagnosis not present

## 2021-01-08 DIAGNOSIS — E1151 Type 2 diabetes mellitus with diabetic peripheral angiopathy without gangrene: Secondary | ICD-10-CM | POA: Diagnosis not present

## 2021-01-08 DIAGNOSIS — I10 Essential (primary) hypertension: Secondary | ICD-10-CM | POA: Diagnosis not present

## 2021-01-08 DIAGNOSIS — J449 Chronic obstructive pulmonary disease, unspecified: Secondary | ICD-10-CM | POA: Diagnosis not present

## 2021-01-08 DIAGNOSIS — Z853 Personal history of malignant neoplasm of breast: Secondary | ICD-10-CM | POA: Diagnosis not present

## 2021-01-08 DIAGNOSIS — E43 Unspecified severe protein-calorie malnutrition: Secondary | ICD-10-CM | POA: Diagnosis not present

## 2021-01-08 DIAGNOSIS — E785 Hyperlipidemia, unspecified: Secondary | ICD-10-CM | POA: Diagnosis not present

## 2021-01-08 DIAGNOSIS — I2510 Atherosclerotic heart disease of native coronary artery without angina pectoris: Secondary | ICD-10-CM | POA: Diagnosis not present

## 2021-01-08 DIAGNOSIS — S4292XD Fracture of left shoulder girdle, part unspecified, subsequent encounter for fracture with routine healing: Secondary | ICD-10-CM | POA: Diagnosis not present

## 2021-01-08 DIAGNOSIS — I700 Atherosclerosis of aorta: Secondary | ICD-10-CM | POA: Diagnosis not present

## 2021-01-08 DIAGNOSIS — Z8673 Personal history of transient ischemic attack (TIA), and cerebral infarction without residual deficits: Secondary | ICD-10-CM | POA: Diagnosis not present

## 2021-01-08 DIAGNOSIS — Z7982 Long term (current) use of aspirin: Secondary | ICD-10-CM | POA: Diagnosis not present

## 2021-01-08 DIAGNOSIS — S43085D Other dislocation of left shoulder joint, subsequent encounter: Secondary | ICD-10-CM | POA: Diagnosis not present

## 2021-01-08 DIAGNOSIS — J811 Chronic pulmonary edema: Secondary | ICD-10-CM | POA: Diagnosis not present

## 2021-01-08 DIAGNOSIS — F17210 Nicotine dependence, cigarettes, uncomplicated: Secondary | ICD-10-CM | POA: Diagnosis not present

## 2021-01-08 DIAGNOSIS — Z9011 Acquired absence of right breast and nipple: Secondary | ICD-10-CM | POA: Diagnosis not present

## 2021-01-10 DIAGNOSIS — Z8673 Personal history of transient ischemic attack (TIA), and cerebral infarction without residual deficits: Secondary | ICD-10-CM | POA: Diagnosis not present

## 2021-01-10 DIAGNOSIS — I10 Essential (primary) hypertension: Secondary | ICD-10-CM | POA: Diagnosis not present

## 2021-01-10 DIAGNOSIS — I2510 Atherosclerotic heart disease of native coronary artery without angina pectoris: Secondary | ICD-10-CM | POA: Diagnosis not present

## 2021-01-10 DIAGNOSIS — Z9011 Acquired absence of right breast and nipple: Secondary | ICD-10-CM | POA: Diagnosis not present

## 2021-01-10 DIAGNOSIS — Z853 Personal history of malignant neoplasm of breast: Secondary | ICD-10-CM | POA: Diagnosis not present

## 2021-01-10 DIAGNOSIS — E785 Hyperlipidemia, unspecified: Secondary | ICD-10-CM | POA: Diagnosis not present

## 2021-01-10 DIAGNOSIS — Z7982 Long term (current) use of aspirin: Secondary | ICD-10-CM | POA: Diagnosis not present

## 2021-01-10 DIAGNOSIS — J449 Chronic obstructive pulmonary disease, unspecified: Secondary | ICD-10-CM | POA: Diagnosis not present

## 2021-01-10 DIAGNOSIS — J811 Chronic pulmonary edema: Secondary | ICD-10-CM | POA: Diagnosis not present

## 2021-01-10 DIAGNOSIS — I700 Atherosclerosis of aorta: Secondary | ICD-10-CM | POA: Diagnosis not present

## 2021-01-10 DIAGNOSIS — S43085D Other dislocation of left shoulder joint, subsequent encounter: Secondary | ICD-10-CM | POA: Diagnosis not present

## 2021-01-10 DIAGNOSIS — E1151 Type 2 diabetes mellitus with diabetic peripheral angiopathy without gangrene: Secondary | ICD-10-CM | POA: Diagnosis not present

## 2021-01-10 DIAGNOSIS — F17210 Nicotine dependence, cigarettes, uncomplicated: Secondary | ICD-10-CM | POA: Diagnosis not present

## 2021-01-10 DIAGNOSIS — S4292XD Fracture of left shoulder girdle, part unspecified, subsequent encounter for fracture with routine healing: Secondary | ICD-10-CM | POA: Diagnosis not present

## 2021-01-10 DIAGNOSIS — E43 Unspecified severe protein-calorie malnutrition: Secondary | ICD-10-CM | POA: Diagnosis not present

## 2021-01-11 NOTE — Progress Notes (Addendum)
Cardiology Office Note:    Date:  01/21/2021   ID:  Crystal Osborne, DOB [birth year], MRN [id number]  PCP:  Colon Branch, MD  Cardiologist:  Quay Burow, MD  Electrophysiologist:  None   Referring MD: Colon Branch, MD   Chief Complaint: follow-up of dizziness  History of Present Illness:    Crystal Osborne is a 87 y.o. female with a history of apical hypertrophic cardiomyopathy noted on recent Echo in 09/2020, palpitations with frequent PVCs (15% burden) noted on monitor in 09/2020, PAD s/p atherectomy/PTA/stenting of bilateral ostial iliac arteries in 11/2015, bilateral carotid stenosis (left > right), small AAA, CVA, COPD, hypertension, hyperlipidemia, type 2 diabetes mellitus, hypothyroidism, and continued tobacco abuse who is followed by Dr. Gwenlyn Found and presents today for follow-up of dizziness.  Patient was initially referred to Dr. Gwenlyn Found in 11/2015 for bilateral claudication. Dopplers showed showed ABI of 0.68 on the right and 0.78 on the left with high frequency signals in the origins of both iliac arteries.  Peripheral angiogram which showed 95% stenosis of bilateral ostial common iliac artery and he underwent successful atherectomy/stenting of these lesions bilaterally. She also has a history of bilateral carotid stenosis and small AAA. Last carotid ultrasound in 2017 showed significant atherosclerotic plaque at both carotid bifurcations with 50-69% stenosis of right ICA and >70% stenosis of left ICA. Most recent ultrasounds in 08/2020 showed abnormal dilatation of the mid abdominal aorta with the largest measurement being 3.6cm (stable from prior study). ABI was 0.79 on the left (slightly decreased compared to prior study) and 0.86 on the right.  Patient has not been seen by Dr. Gwenlyn Found since 2020; however, he was added on to Dr. Newman Nickels DOD schedule on 09/13/2020 for further evaluation of dizziness and near syncope that had been occurring several times per day. BP was soft in the office so  Felodipine was stopped. Echo and Zio monitor were ordered for further evaluation. Echo showed significant hypertrophy of the LV apex with slit like cavity of the LV apex. Frequent ectopy made assessment of LVEF difficult but it looked mildly decreased. Also showed mild MR and mild to moderate TR. Cardiac MR was recommended if patient was willing. Monitor showed frequent PVCs (15%), a few short runs of SVT, and one short run of NSVT. He was seen by Coletta Memos, NP, for follow-up on 10/04/2020 at which time she reported continued dizziness. Patient declined cardiac MRI or any other invasive procedures. She was started on Toprol-XL 12.5mg  daily and Lisinopril was stopped. She was advised to follow-up in 2 months.  Since last visit, she was admitted from 10/10/2020 to 10/19/2020 for pneumonia complicated by delirium and was treated with antibiotics. She also was noted to have dysphagia. MBS was done and speech therapist recommended dysphagia 3 diet with nectar thick liquids. She was discharged to a SNF. She was readmitted from 11/07/2020 to 11/13/2020 for a left shoulder fracture/dislocation after a mechanical fall at home and underwent closed reduction in the OR. Of note, BNP was elevated at 2,830 and chest x-ray showed bilateral interstitial thickening concerning for mild interstitial edema with a small left pleural effusion. However, she was not felt to be volume overloaded on exam and she had no respiratory compromise. She was not diuresed due to concern that this would drop preload unnecessarily.    Patient presents today for follow-up.  Here with her son.  Patient biggest complaint today is left shoulder pain.  She saw Ortho earlier today and they discussed surgery  versus steroid injection.  It sounds like they are hesitant to do surgery as they do not expect the mobility of her arm to improve much and she is felt to be at higher risk given significant smoking history and cardiac history.  She denies any chest  pain.  She has chronic shortness of breath due to her smoking history but states this is stable.  No orthopnea or PND.  She does have some mild lower extremity edema bilaterally but worse on the left which is new.  She denies any palpitations.  Her dizziness has improved and she denies any syncope. She continues to have some unsteadiness on her feet/balance issues.   Past Medical History:  Diagnosis Date   AAA (abdominal aortic aneurysm)    sees Dr Eden Lathe    B12 deficiency    Cancer of right breast [facility]) 1960   Carotid artery disease 11/28/2008   sees Dr [place] Alar    COPD (chronic obstructive pulmonary disease) ([place]) 01/25/2014   CVA (cerebrovascular accident) ([place]) 2006   Diabetes mellitus ([place]) 05/29/2010   A1c 6.0 on October 2011    Hyperlipidemia    Hypertension    Osteoporosis 2003   Palpitations    frequent PVCs (15% burden) noted on monitor in 09/2020   Pneumonia ~ 2014; ~ 2015   Prolapse of female bladder, acquired    Shingles    repeated episone 9/11 went to a UC   TIA (transient ischemic attack) ~ 2005   Transient hypothyroidism    d/t thyroidectomy   UTI (lower urinary tract infection) 06/2011    Past Surgical History:  Procedure Laterality Date   ABDOMINAL HYSTERECTOMY     no oophorectomy   CATARACT EXTRACTION W/ INTRAOCULAR LENS  IMPLANT, BILATERAL Bilateral    DILATION AND CURETTAGE OF UTERUS     MASTECTOMY, RADICAL Right 1960s   remotely    PERIPHERAL VASCULAR CATHETERIZATION  12/03/2015   PERIPHERAL VASCULAR CATHETERIZATION N/A 12/03/2015   Procedure: Abdominal Aortogram;  Surgeon: Lorretta Harp, MD;  Location: [place] [facility];  Service: Cardiovascular;  Laterality: N/A;   PERIPHERAL VASCULAR CATHETERIZATION Bilateral 12/03/2015   Procedure: Lower Extremity Angiography;  Surgeon: Lorretta Harp, MD;  Location: [place] [facility];  Service: Cardiovascular;  Laterality: Bilateral;   PERIPHERAL VASCULAR CATHETERIZATION Bilateral 12/03/2015   Procedure:  Peripheral Vascular Intervention;  Surgeon: Lorretta Harp, MD;  Location: [place] [facility];  Service: Cardiovascular;  Laterality: Bilateral;  47mmx58mm Lif[place]ream bilateral Common Iliac Arteries   PERIPHERAL VASCULAR CATHETERIZATION Bilateral 12/03/2015   Procedure: Peripheral Vascular Atherectomy;  Surgeon: Lorretta Harp, MD;  Location: [place] [facility];  Service: Cardiovascular;  Laterality: Bilateral;  Common Iliac Arteries   SHOULDER CLOSED REDUCTION Left 11/08/2020   Procedure: CLOSED MANIPULATION SHOULDER;  Surgeon: Tania Ade, MD;  Location: [facility];  Service: Orthopedics;  Laterality: Left;   THYROIDECTOMY      Current Medications: Current Meds  Medication Sig   acetaminophen (TYLENOL) 500 MG tablet Take 2 tablets (1,000 mg total) by mouth every 6 (six) hours as needed (for pain).   albuterol (VENTOLIN HFA) 108 (90 Base) MCG/ACT inhaler INHALE 2 PUFFS INTO THE LUNGS EVERY 6 HOURS AS NEEDED FOR WHEEZING OR SHORTNESS OF BREATH   aspirin EC 81 MG tablet Take 81 mg by mouth every evening.   atorvastatin (LIPITOR) 40 MG tablet TAKE 1 TABLET(40 MG) BY MOUTH DAILY (Patient taking differently: Take 40 mg by mouth daily.)   Cholecalciferol (VITAMIN D3 PO) Take  1 tablet by mouth daily.   hydroxypropyl methylcellulose / hypromellose (ISOPTO TEARS / GONIOVISC) 2.5 % ophthalmic solution Place 1 drop into both eyes 2 (two) times daily as needed for dry eyes.   metoprolol succinate (TOPROL XL) 25 MG 24 hr tablet Take 0.5 tablets (12.5 mg total) by mouth daily. (Patient taking differently: Take 12.5 mg by mouth 2 (two) times daily.)   vitamin B-12 (CYANOCOBALAMIN) 1000 MCG tablet Take 1,000 mcg by mouth daily.   [DISCONTINUED] furosemide (LASIX) 20 MG tablet Take 1 tablet (20 mg total) by mouth daily.     Allergies:   Septra [sulfamethoxazole-trimethoprim], Tramadol, Ciprofloxacin hcl, and Codeine   Social History   Socioeconomic History   Marital status: Divorced    Spouse name:  Not on file   Number of children: 3   Years of education: Not on file   Highest education level: Not on file  Occupational History   Occupation: retired   Tobacco Use   Smoking status: Every Day    Packs/day: 0.50    Years: 67.00    Pack years: 33.50    Types: Cigarettes   Smokeless tobacco: Never   Tobacco comments:    1/2 ppd  Vaping Use   Vaping Use: Never used  Substance and Sexual Activity   Alcohol use: No    Alcohol/week: 0.0 standard drinks   Drug use: No   Sexual activity: Never  Other Topics Concern   Not on file  Social History Narrative   Lives by herself , still drives some    Has 3 children, 1 living son    Emergency contact: Tykeisha Peer [phone], he lives in the [place] area   Lost a son ~ 2017, he had  lung ca, stroke (09-2011)    Lost another son Dominica Severin) 02/02/2019     Lost a sister died 78-15           Social Determinants of Health   Financial Resource Strain: Not on file  Food Insecurity: Not on file  Transportation Needs: Not on file  Physical Activity: Not on file  Stress: Not on file  Social Connections: Not on file     Family History: The patient's family history includes Cancer in an other family member. There is no history of Colon cancer, Coronary artery disease, Diabetes type II, or Breast cancer.  ROS:   Please see the history of present illness.     EKGs/Labs/Other Studies Reviewed:    The following studies were reviewed today:  Carotid Ultrasound 11/17/2015: Impressions: Significant atherosclerotic plaque at both carotid bifurcations. Estimated right ICA stenosis is 50- 69%. Estimated left ICA stenosis is greater than 70%. Maximal velocities in the proximal left ICA are stable since 2013. Both proximal external carotid arteries show velocity elevation consistent with significant stenoses. _______________  Aorta/IVC/Iliacs Ultrasound 08/20/2020: Summary:  Abdominal Aorta: Aortoiliac atherosclerosis. There is evidence of abnormal   dilatation of the mid Abdominal aorta. The largest aortic measurement is  3.6 cm. Prominent proximal left external iliac artery with dampened and  turbulent flow, measuring 1.4 cm.  The largest aortic diameter remains essentially unchanged compared to  prior exam. Previous diameter measurement was 3.6 cm obtained on  09/05/2019.   Stenosis:  +--------------------+-------------+---------------+-----------------------  ----+  Location            Stenosis     Stent          Comments                      +--------------------+-------------+---------------+-----------------------  ----+  Right Common Iliac               50-99% stenosisStable velocities              +--------------------+-------------+---------------+-----------------------  ----+  Left Common Iliac                50-99% stenosisStable velocities              +--------------------+-------------+---------------+-----------------------  ----+  Right External Iliac>50% stenosis               Increase velocities in  the                                                   proximal segment when                                                          compared to prior exam        +--------------------+-------------+---------------+-----------------------  ----+  Left External Iliac <50% stenosis               Decrease velocities                                                            compared to prior exam        +--------------------+-------------+---------------+-----------------------  ----+   IVC/Iliac: Patent IVC.   ABI Summary: Right: Resting right ankle-brachial index indicates mild right lower  extremity arterial disease. The right toe-brachial index is normal.   TBIs increased by .15.  Left: Resting left ankle-brachial index indicates moderate left lower  extremity arterial disease. The left toe-brachial index is abnormal.   ABIs decreased by .02 and TBIs  increased by .07.  Right ABIs and left TBIs appear essentially unchanged compared to prior study on 09/05/2019. Left ABIs appear decreased compared to prior study on 09/05/2019. Right TBIs appear increased compared to prior study on 09/05/2019.  _______________  Zio Monitor 09/16/2020 to 09/19/2020: Frequent PVCs (15% of beats) 7 episodes of SVT, longest lasting 14 seconds with average rate 107 bpm 1 episode of NSVT lasting 8 beats _______________  Echocardiogram 10/01/2020: Impressions:  1. Significant hypertrophy of LV apex with slit like cavity of LV apex.  Frequent ectopy/bigeminy, makes assessment of LVEF difficult OVerall LVEF  appears mildly decreased.. The left ventricular internal cavity size was  mildly dilated.   2. Right ventricular systolic function is low normal. The right  ventricular size is normal. There is mildly elevated pulmonary artery  systolic pressure.   3. Left atrial size was mildly dilated.   4. Mild mitral valve regurgitation.   5. Tricuspid valve regurgitation is mild to moderate.   6. The aortic valve is tricuspid. Aortic valve regurgitation is not  visualized. Mild to moderate aortic valve sclerosis/calcification is  present, without any evidence of aortic stenosis.   7. The inferior vena cava is normal in size with greater than 50%  respiratory variability, suggesting right atrial pressure of 3  mmHg.   EKG:  EKG not ordered today.   Recent Labs: 10/11/2020: TSH 1.765 10/18/2020: ALT 25; Magnesium 1.8 11/08/2020: B Natriuretic Peptide 2,830.3 11/09/2020: BUN 13; Creatinine, Ser 0.44; Hemoglobin 12.6; Platelets 261; Potassium 3.8; Sodium 137  Recent Lipid Panel    Component Value Date/Time   CHOL 134 10/24/2019 1154   TRIG 117 10/24/2019 1154   TRIG 112 01/06/2006 0841   HDL 51 10/24/2019 1154   CHOLHDL 2.6 10/24/2019 1154   VLDL 21.8 07/19/2018 0919   LDLCALC 63 10/24/2019 1154   LDLDIRECT 149.2 08/03/2006 1054    Physical Exam:    Vital Signs:  BP (!) 177/99   Pulse 83   Ht 5\' 5"  (1.651 m)   Wt 102 lb (46.3 kg)   SpO2 96%   BMI 16.97 kg/m     Wt Readings from Last 3 Encounters:  01/21/21 102 lb (46.3 kg)  12/31/20 102 lb 2 oz (46.3 kg)  12/11/20 102 lb 2 oz (46.3 kg)     General: 87 y.o. Caucasian female in no acute distress. HEENT: Normocephalic and atraumatic. Sclera clear.  Neck: Supple. Right carotid bruit noted. No JVD. Heart: RRR. Distinct S1 and S2. No murmurs, gallops, or rubs. Radial pulses 2+ and equal bilaterally. Lungs: No increased work of breathing. Clear to ausculation bilaterally. No wheezes, rhonchi, or rales.  Abdomen: Soft, non-distended, and non-tender to palpation.  Extremities: 1+ pitting edema of bilateral lower extremities (left > right). Skin: Warm and dry. Neuro: Alert and oriented x3. No focal deficits. Psych: Normal affect. Responds appropriately.  Assessment:    1. Dizziness   2. Frequent PVCs   3. Apical variant hypertrophic cardiomyopathy ([place])   4. Bilateral carotid artery stenosis   5. PAD (peripheral artery disease) (HCC)   6. Abdominal aortic aneurysm (AAA) without rupture, unspecified part   7. Primary hypertension   8. Hyperlipidemia, unspecified hyperlipidemia type   9. Type 2 diabetes mellitus with complication, without long-term current use of insulin ([place])   10. Leg cramps   11. Bilateral leg edema   12. Medication management     Plan:    Dizziness Frequent PVCs Patient has had dizziness/near syncope the last couple of months. Echo in 09/2020 showed significant hypertrophy of the LV apex with slit like cavity of the LV apex. Frequent ectopy made assessment of LVEF difficult but it looked mildly decreased. Also showed mild MR and mild to moderate TR. Monitor in 09/2020 showed frequent PVCs (15%), a few short runs of SVT, and one short run of NSVT. Felodipine and Lisinopril were stopped and Toprol-XL was started.  - Dizziness improved. No ectopy noted on exam. - Continue  Toprol-XL 12.5mg  daily.  Apical Hypertrophic Cardiomyopathy Recent Echo showed significant hypertrophy of the LV apex with slit like cavity of the LV apex. Cardiac MRI was recommended but patient declined this and any invasive procedure.  - She has some mild lower extremity edema on exam but no other signs of acute CHF.  Bilateral Carotid Stenosis  Last carotid ultrasound in 2017 showed significant atherosclerotic plaque at both carotid bifurcations with 50-69% stenosis of right ICA and >70% stenosis of left ICA. - Bruit noted on exam. - Continue aspirin and statin. - Overdue for doppler studies. Will order today.  PAD Small AAA S/p atherectomy/PTA/stenting of bilateral ostial iliac arteries in 11/2015. Most recent ultrasounds showed 08/2020 showed abnormal dilatation of the mid abdominal aorta with the largest measurement being 3.6cm (stable from prior study). ABI was 0.79 on  the left (slightly decreased compared to prior study) and 0.86 on the right. - No claudication. - Continue aspirin and statin.  - Follow-up with Dr. Gwenlyn Found.  Hypertension BP elevated today at 177/99 (160/80s on my recheck). BP is usually on the softer side.  - Continue Tropol-XL 12.5mg  daily. - Previously on Lisinopril but this was stopped at last visit given dizziness, soft BP, and need for beta-blocker.   Hyperlipidemia Most recent lipid panel in 10/2019: Total Cholesterol 134, triglycerides 117, HDL 51, LDL 63. - Continue Lipitor 40mg  daily.  - Labs followed by PCP.  Type 2 Diabetes Mellitus Hemoglobin A1c 5.7 in 05/2020. - Management per PCP.  Lower Extremity Edema Leg Cramps Patient has some new lower extremity edema (left leg slightly larger than right). No other signs of CHF. She also notes some cramping of right leg a night. - Will start Lasix 20mg  daily for 3 days. - Will check BMET and Magnesium given reports of cramps. - Will order D-dimer to rule out D-dimer given asymmetric swelling and the fact  that patient is very sedentary. If positive, will need lower extremity doppler.   Pre-Op Evaluation Patient fractured/dislocated her left shoulder in 10/2020. She underwent underwent closed reduction in the OR at that time. She is still having significant pain and mobility issues with that arm. She saw Ortho today and they discussed reverse total surgery replacement vs steroid injection. It sound like they are hesitant to do surgery given as they do not expect the mobility of her arm to improve much and she is felt to be at higher risk given significant smoking history and cardiac history. Patient also seems hesitant to have surgery. - Patient is very sedentary. I would recommend a Myoview prior to surgery. Offered to go ahead and order this in case patient decides to have surgery but she wanted to hold off on this.   Disposition: Follow up in 1 month.   ADDENDUM 01/22/2021 at 12:38PM: D-Dimer came back positive at 1.0. Therefore, will order lower extremity venous ultrasounds.   Medication Adjustments/Labs and Tests Ordered: Current medicines are reviewed at length with the patient today.  Concerns regarding medicines are outlined above.  Orders Placed This Encounter  Procedures   Basic metabolic panel   D-dimer, quantitative   Magnesium   VAS US CAROTID    Meds ordered this encounter  Medications   DISCONTD: furosemide (LASIX) 20 MG tablet    Sig: Take 1 tablet (20 mg total) by mouth daily.    Dispense:  90 tablet    Refill:  3   furosemide (LASIX) 20 MG tablet    Sig: Take 1 tablet (20 mg total) by mouth as needed for edema.    Dispense:  30 tablet    Refill:  1     Patient Instructions  Medication Instructions:  START Lasix 20 mg daily for 3 days then on day 4 going forward take as needed for swelling  *If you need a refill on your cardiac medications before your next appointment, please call your pharmacy*  Lab Work: Your physician recommends that you return for lab work  TODAY:  BMET D-Dimer Magnesium   If you have labs (blood work) drawn today and your tests are completely normal, you will receive your results only by: [employer] (if you have MyChart) OR A paper copy in the mail If you have any lab test that is abnormal or we need to change your treatment, we will call you to review the  results.  Testing/Procedures: Your physician has requested that you have a carotid duplex. This test is an ultrasound of the carotid arteries in your neck. It looks at blood flow through these arteries that supply the brain with blood. Allow one hour for this exam. There are no restrictions or special instructions.  Follow-Up: At [facility], you and your health needs are our priority.  As part of our continuing mission to provide you with exceptional heart care, we have created designated Provider Care Teams.  These Care Teams include your primary Cardiologist (physician) and Advanced Practice Providers (APPs -  Physician Assistants and Nurse Practitioners) who all work together to provide you with the care you need, when you need it.  Your next appointment:   1 month(s)  The format for your next appointment:   In Person  Provider:   Sande Rives, PA-C  Then, Quay Burow, MD will plan to see you again in 3-4 month(s).   Other Instructions Monitor blood pressure at home for 2 weeks. Call our office with readings   Signed, Eppie Gibson  01/21/2021 12:56 PM    [place]

## 2021-01-14 ENCOUNTER — Ambulatory Visit: Payer: Medicare Other | Admitting: Internal Medicine

## 2021-01-19 ENCOUNTER — Encounter: Payer: Self-pay | Admitting: Student

## 2021-01-19 DIAGNOSIS — R002 Palpitations: Secondary | ICD-10-CM | POA: Insufficient documentation

## 2021-01-21 ENCOUNTER — Encounter: Payer: Self-pay | Admitting: Student

## 2021-01-21 ENCOUNTER — Other Ambulatory Visit: Payer: Self-pay

## 2021-01-21 ENCOUNTER — Ambulatory Visit (INDEPENDENT_AMBULATORY_CARE_PROVIDER_SITE_OTHER): Payer: Medicare Other | Admitting: Student

## 2021-01-21 VITALS — BP 177/99 | HR 83 | Ht 65.0 in | Wt 102.0 lb

## 2021-01-21 DIAGNOSIS — I714 Abdominal aortic aneurysm, without rupture, unspecified: Secondary | ICD-10-CM

## 2021-01-21 DIAGNOSIS — E118 Type 2 diabetes mellitus with unspecified complications: Secondary | ICD-10-CM

## 2021-01-21 DIAGNOSIS — I422 Other hypertrophic cardiomyopathy: Secondary | ICD-10-CM | POA: Diagnosis not present

## 2021-01-21 DIAGNOSIS — Z79899 Other long term (current) drug therapy: Secondary | ICD-10-CM

## 2021-01-21 DIAGNOSIS — R42 Dizziness and giddiness: Secondary | ICD-10-CM

## 2021-01-21 DIAGNOSIS — I493 Ventricular premature depolarization: Secondary | ICD-10-CM | POA: Diagnosis not present

## 2021-01-21 DIAGNOSIS — Z01810 Encounter for preprocedural cardiovascular examination: Secondary | ICD-10-CM

## 2021-01-21 DIAGNOSIS — R600 Localized edema: Secondary | ICD-10-CM

## 2021-01-21 DIAGNOSIS — Z01818 Encounter for other preprocedural examination: Secondary | ICD-10-CM

## 2021-01-21 DIAGNOSIS — I6523 Occlusion and stenosis of bilateral carotid arteries: Secondary | ICD-10-CM

## 2021-01-21 DIAGNOSIS — R252 Cramp and spasm: Secondary | ICD-10-CM

## 2021-01-21 DIAGNOSIS — I739 Peripheral vascular disease, unspecified: Secondary | ICD-10-CM

## 2021-01-21 DIAGNOSIS — I10 Essential (primary) hypertension: Secondary | ICD-10-CM

## 2021-01-21 DIAGNOSIS — R002 Palpitations: Secondary | ICD-10-CM

## 2021-01-21 DIAGNOSIS — E785 Hyperlipidemia, unspecified: Secondary | ICD-10-CM

## 2021-01-21 MED ORDER — FUROSEMIDE 20 MG PO TABS: 20.0000 mg | ORAL_TABLET | ORAL | 1 refills | Status: AC | PRN

## 2021-01-21 MED ORDER — FUROSEMIDE 20 MG PO TABS: 20.0000 mg | ORAL_TABLET | Freq: Every day | ORAL | 3 refills | Status: DC

## 2021-01-21 NOTE — Patient Instructions (Addendum)
Medication Instructions:  START Lasix 20 mg daily for 3 days then on day 4 going forward take as needed for swelling  *If you need a refill on your cardiac medications before your next appointment, please call your pharmacy*  Lab Work: Your physician recommends that you return for lab work TODAY:  BMET D-Dimer Magnesium   If you have labs (blood work) drawn today and your tests are completely normal, you will receive your results only by: [place] (if you have MyChart) OR A paper copy in the mail If you have any lab test that is abnormal or we need to change your treatment, we will call you to review the results.  Testing/Procedures: Your physician has requested that you have a carotid duplex. This test is an ultrasound of the carotid arteries in your neck. It looks at blood flow through these arteries that supply the brain with blood. Allow one hour for this exam. There are no restrictions or special instructions.  Follow-Up: At [facility], you and your health needs are our priority.  As part of our continuing mission to provide you with exceptional heart care, we have created designated Provider Care Teams.  These Care Teams include your primary Cardiologist (physician) and Advanced Practice Providers (APPs -  Physician Assistants and Nurse Practitioners) who all work together to provide you with the care you need, when you need it.  Your next appointment:   1 month(s)  The format for your next appointment:   In Person  Provider:   Sande Rives, PA-C  Then, Quay Burow, MD will plan to see you again in 3-4 month(s).   Other Instructions Monitor blood pressure at home for 2 weeks. Call our office with readings

## 2021-01-22 LAB — BASIC METABOLIC PANEL
BUN/Creatinine Ratio: 22 (ref 12–28)
BUN: 15 mg/dL (ref 8–27)
CO2: 24 mmol/L (ref 20–29)
Calcium: 9.4 mg/dL (ref 8.7–10.3)
Chloride: 103 mmol/L (ref 96–106)
Creatinine, Ser: 0.68 mg/dL (ref 0.57–1.00)
Glucose: 88 mg/dL (ref 70–99)
Potassium: 4.5 mmol/L (ref 3.5–5.2)
Sodium: 141 mmol/L (ref 134–144)
eGFR: 84 mL/min/{1.73_m2} (ref >59)

## 2021-01-22 LAB — D-DIMER, QUANTITATIVE: D-DIMER: 1 mg/L FEU — ABNORMAL HIGH (ref 0.00–0.49)

## 2021-01-22 LAB — MAGNESIUM: Magnesium: 2 mg/dL (ref 1.6–2.3)

## 2021-01-24 ENCOUNTER — Other Ambulatory Visit: Payer: Self-pay

## 2021-01-24 DIAGNOSIS — R7989 Other specified abnormal findings of blood chemistry: Secondary | ICD-10-CM

## 2021-01-24 DIAGNOSIS — I739 Peripheral vascular disease, unspecified: Secondary | ICD-10-CM

## 2021-01-30 ENCOUNTER — Ambulatory Visit (HOSPITAL_COMMUNITY)
Admission: RE | Admit: 2021-01-30 | Discharge: 2021-01-30 | Disposition: A | Discharge status: Home / Self Care | Payer: Medicare Other | Source: Ambulatory Visit | Attending: Cardiology | Admitting: Cardiology

## 2021-01-30 ENCOUNTER — Encounter: Payer: Self-pay | Admitting: Cardiology

## 2021-01-30 ENCOUNTER — Other Ambulatory Visit: Payer: Self-pay

## 2021-01-30 ENCOUNTER — Ambulatory Visit (INDEPENDENT_AMBULATORY_CARE_PROVIDER_SITE_OTHER): Payer: Medicare Other | Admitting: Cardiology

## 2021-01-30 VITALS — BP 189/107 | HR 71 | Ht 65.0 in | Wt 102.0 lb

## 2021-01-30 DIAGNOSIS — I824Y2 Acute embolism and thrombosis of unspecified deep veins of left proximal lower extremity: Secondary | ICD-10-CM | POA: Diagnosis not present

## 2021-01-30 DIAGNOSIS — R600 Localized edema: Secondary | ICD-10-CM

## 2021-01-30 DIAGNOSIS — I739 Peripheral vascular disease, unspecified: Secondary | ICD-10-CM | POA: Insufficient documentation

## 2021-01-30 DIAGNOSIS — R7989 Other specified abnormal findings of blood chemistry: Secondary | ICD-10-CM | POA: Insufficient documentation

## 2021-01-30 DIAGNOSIS — I10 Essential (primary) hypertension: Secondary | ICD-10-CM

## 2021-01-30 MED ORDER — APIXABAN 5 MG PO TABS: 5.0000 mg | ORAL_TABLET | Freq: Two times a day (BID) | ORAL | 3 refills | Status: DC

## 2021-01-30 MED ORDER — LISINOPRIL 5 MG PO TABS: 5.0000 mg | ORAL_TABLET | Freq: Every day | ORAL | 3 refills | Status: DC

## 2021-01-30 NOTE — Patient Instructions (Addendum)
Medication Instructions:   STOP: ASPIRIN   PLEASE TAKE ELIQUIS 10mg  (2) TABLETS TWICE DAILY FOR ONE WEEK. (SAMPLES PROVIDED)   THEN: DECREASE TO ELIQUIS to 5mg  (1) TABLET TWICE DAILY THEREAFTER  START: LISINOPRIL 5mg  ONCE DAILY   *If you need a refill on your cardiac medications before your next appointment, please call your pharmacy*  Lab Work: [place] 1 WEEK FOR BLOOD WORK- NO APPOINTMENT NEEDED- [place] ARE Monday-Friday 8am-4pm If you have labs (blood work) drawn today and your tests are completely normal, you will receive your results only by: [place] (if you have MyChart) OR A paper copy in the mail If you have any lab test that is abnormal or we need to change your treatment, we will call you to review the results.  Follow-Up: At [facility], you and your health needs are our priority.  As part of our continuing mission to provide you with exceptional heart care, we have created designated Provider Care Teams.  These Care Teams include your primary Cardiologist (physician) and Advanced Practice Providers (APPs -  Physician Assistants and Nurse Practitioners) who all work together to provide you with the care you need, when you need it.  Your next appointment:   AS SCHEDULED   The format for your next appointment:   In Person  Provider:  Sande Rives PA-C

## 2021-01-30 NOTE — Progress Notes (Signed)
Cardiology Office Note:    Date:  01/30/2021   ID:  Crystal Osborne, DOB [birth year], MRN [id number]  PCP:  Colon Branch, MD  Cardiologist:  Quay Burow, MD  Electrophysiologist:  None   Referring MD: Colon Branch, MD   Chief Complaint  Patient presents with   DVT     History of Present Illness:    Crystal Osborne is a 87 y.o. female with a hx of PAD, tobacco use, hypertension, hyperlipidemia, TIA, T2DM, COPD, carotid artery disease presents for follow-up.  She follows with Dr. Gwenlyn Found.  I saw her for a DOD visit on 09/13/2020.  At the time she was having dizziness and near syncope.  Her BP was low, we stopped felodipine.  Echocardiogram is ordered and showed concern for apical HCM (she declined MRI for further evaluation), also EF appeared mildly decreased but difficult to assess due to frequent ectopy.  Zio patch showed frequent PVCs (15% of beats).  She was started on Toprol-XL 12.5 mg daily and lisinopril was discontinued.  She was admitted in September 2022 for pneumonia complicated by delirium.  She was admitted in October 2022 for left shoulder fracture/dislocation after mechanical fall.  She was seen by Sande Rives, PA on 01/21/2021 and noted to have lower extremity edema.  Lower extremity duplex was done today, which showed acute DVT in the femoral vein and age-indeterminate DVT in right soleal veins.  She reports no history of bleeding.  She did have a fall in fractured shoulder in October, but reports no other falls recently.  Reports that this episode happened she was trying to push the recliner in with her foot and tripped and fell.  Does continue to have some dizziness.  She lives by herself but has people stay with her overnight.    BP Readings from Last 3 Encounters:  01/30/21 (!) 189/107  01/21/21 (!) 177/99  12/31/20 116/68     Past Medical History:  Diagnosis Date   AAA (abdominal aortic aneurysm)    sees Dr Eden Lathe    B12 deficiency    Cancer of right breast [facility])  1960   Carotid artery disease 11/28/2008   sees Dr Oneida Alar    COPD (chronic obstructive pulmonary disease) ([place]) 01/25/2014   CVA (cerebrovascular accident) ([place]) 2006   Diabetes mellitus ([place]) 05/29/2010   A1c 6.0 on October 2011    Hyperlipidemia    Hypertension    Osteoporosis 2003   Palpitations    frequent PVCs (15% burden) noted on monitor in 09/2020   Pneumonia ~ 2014; ~ 2015   Prolapse of female bladder, acquired    Shingles    repeated episone 9/11 went to a UC   TIA (transient ischemic attack) ~ 2005   Transient hypothyroidism    d/t thyroidectomy   UTI (lower urinary tract infection) 06/2011    Past Surgical History:  Procedure Laterality Date   ABDOMINAL HYSTERECTOMY     no oophorectomy   CATARACT EXTRACTION W/ INTRAOCULAR LENS  IMPLANT, BILATERAL Bilateral    DILATION AND CURETTAGE OF UTERUS     MASTECTOMY, RADICAL Right 1960s   remotely    PERIPHERAL VASCULAR CATHETERIZATION  12/03/2015   PERIPHERAL VASCULAR CATHETERIZATION N/A 12/03/2015   Procedure: Abdominal Aortogram;  Surgeon: Lorretta Harp, MD;  Location: [place] [facility];  Service: Cardiovascular;  Laterality: N/A;   PERIPHERAL VASCULAR CATHETERIZATION Bilateral 12/03/2015   Procedure: Lower Extremity Angiography;  Surgeon: Lorretta Harp, MD;  Location: [place] [facility];  Service: Cardiovascular;  Laterality: Bilateral;   PERIPHERAL VASCULAR CATHETERIZATION Bilateral 12/03/2015   Procedure: Peripheral Vascular Intervention;  Surgeon: Lorretta Harp, MD;  Location: [place] [facility];  Service: Cardiovascular;  Laterality: Bilateral;  25mmx58mm [place] bilateral Common Iliac Arteries   PERIPHERAL VASCULAR CATHETERIZATION Bilateral 12/03/2015   Procedure: Peripheral Vascular Atherectomy;  Surgeon: Lorretta Harp, MD;  Location: [place] [facility];  Service: Cardiovascular;  Laterality: Bilateral;  Common Iliac Arteries   SHOULDER CLOSED REDUCTION Left 11/08/2020   Procedure: CLOSED MANIPULATION  SHOULDER;  Surgeon: Tania Ade, MD;  Location: [facility];  Service: Orthopedics;  Laterality: Left;   THYROIDECTOMY      Current Medications: Current Meds  Medication Sig   acetaminophen (TYLENOL) 500 MG tablet Take 2 tablets (1,000 mg total) by mouth every 6 (six) hours as needed (for pain).   apixaban (ELIQUIS) 5 MG TABS tablet Take 1 tablet (5 mg total) by mouth 2 (two) times daily.   atorvastatin (LIPITOR) 40 MG tablet TAKE 1 TABLET(40 MG) BY MOUTH DAILY (Patient taking differently: Take 40 mg by mouth daily.)   furosemide (LASIX) 20 MG tablet Take 1 tablet (20 mg total) by mouth as needed for edema.   hydroxypropyl methylcellulose / hypromellose (ISOPTO TEARS / GONIOVISC) 2.5 % ophthalmic solution Place 1 drop into both eyes 2 (two) times daily as needed for dry eyes.   lisinopril (ZESTRIL) 5 MG tablet Take 1 tablet (5 mg total) by mouth daily.   metoprolol succinate (TOPROL XL) 25 MG 24 hr tablet Take 0.5 tablets (12.5 mg total) by mouth daily. (Patient taking differently: Take 12.5 mg by mouth 2 (two) times daily.)   vitamin B-12 (CYANOCOBALAMIN) 1000 MCG tablet Take 1,000 mcg by mouth daily.   [DISCONTINUED] aspirin EC 81 MG tablet Take 81 mg by mouth every evening.     Allergies:   Septra [sulfamethoxazole-trimethoprim], Tramadol, Ciprofloxacin hcl, and Codeine   Social History   Socioeconomic History   Marital status: Divorced    Spouse name: Not on file   Number of children: 3   Years of education: Not on file   Highest education level: Not on file  Occupational History   Occupation: retired   Tobacco Use   Smoking status: Every Day    Packs/day: 0.50    Years: 67.00    Pack years: 33.50    Types: Cigarettes   Smokeless tobacco: Never   Tobacco comments:    1/2 ppd  Vaping Use   Vaping Use: Never used  Substance and Sexual Activity   Alcohol use: No    Alcohol/week: 0.0 standard drinks   Drug use: No   Sexual activity: Never  Other Topics Concern   Not on  file  Social History Narrative   Lives by herself , still drives some    Has 3 children, 1 living son    Emergency contact: Evoleht Hovatter [phone], he lives in the [place] area   Lost a son ~ 2017, he had  lung ca, stroke (09-2011)    Lost another son Dominica Severin) 2019/02/25     Lost a sister died 27-15           Social Determinants of Health   Financial Resource Strain: Not on file  Food Insecurity: Not on file  Transportation Needs: Not on file  Physical Activity: Not on file  Stress: Not on file  Social Connections: Not on file     Family History: The patient's family history includes Cancer in  an other family member. There is no history of Colon cancer, Coronary artery disease, Diabetes type II, or Breast cancer.  ROS:   Please see the history of present illness.     All other systems reviewed and are negative.  EKGs/Labs/Other Studies Reviewed:    The following studies were reviewed today:   EKG:  EKG is not ordered today  Recent Labs: 10/11/2020: TSH 1.765 10/18/2020: ALT 25 11/08/2020: B Natriuretic Peptide 2,830.3 11/09/2020: Hemoglobin 12.6; Platelets 261 01/21/2021: BUN 15; Creatinine, Ser 0.68; Magnesium 2.0; Potassium 4.5; Sodium 141  Recent Lipid Panel    Component Value Date/Time   CHOL 134 10/24/2019 1154   TRIG 117 10/24/2019 1154   TRIG 112 01/06/2006 0841   HDL 51 10/24/2019 1154   CHOLHDL 2.6 10/24/2019 1154   VLDL 21.8 07/19/2018 0919   LDLCALC 63 10/24/2019 1154   LDLDIRECT 149.2 08/03/2006 1054    Physical Exam:    VS:  BP (!) 189/107 (BP Location: Left Arm, Patient Position: Sitting, Cuff Size: Small)    Pulse 71    Ht 5\' 5"  (1.651 m)    Wt 102 lb (46.3 kg)    SpO2 98%    BMI 16.97 kg/m     Wt Readings from Last 3 Encounters:  01/30/21 102 lb (46.3 kg)  01/21/21 102 lb (46.3 kg)  12/31/20 102 lb 2 oz (46.3 kg)     GEN:  Well nourished, well developed in no acute distress HEENT: Normal NECK: No JVD CARDIAC: RRR, no murmurs, rubs,  gallops RESPIRATORY:  Clear to auscultation without rales, wheezing or rhonchi  ABDOMEN: Soft, non-tender, non-distended MUSCULOSKELETAL:  No edema; No deformity  SKIN: Warm and dry NEUROLOGIC:  Alert and oriented x 3 PSYCHIATRIC:  Normal affect   ASSESSMENT:    1. Acute deep vein thrombosis (DVT) of proximal vein of left lower extremity ([place])   2. Primary hypertension     PLAN:     Acute DVT: Lower extremity duplex today showed acute DVT in the femoral vein and age-indeterminate DVT in right soleal veins.   -Discussed risks and benefits of anticoagulation.  She denies any bleeding issues.  Did have a mechanical fall in October but otherwise denies any recent falls.  She feels comfortable starting anticoagulation.  We will start Eliquis 10 mg twice daily x1 week then decrease to 5 mg twice daily  Hypertension: BP significantly elevated in clinic today.  We will start lisinopril 5 mg daily.  Continue Toprol-XL 12.5 mg daily for frequent PVCs.  Check BMET in 1 week.  Asked to check BP daily for next 2 weeks and call with results.   Follow-up as scheduled on 03/11/2018   Medication Adjustments/Labs and Tests Ordered: Current medicines are reviewed at length with the patient today.  Concerns regarding medicines are outlined above.  Orders Placed This Encounter  Procedures   Basic metabolic panel   CBC    Meds ordered this encounter  Medications   lisinopril (ZESTRIL) 5 MG tablet    Sig: Take 1 tablet (5 mg total) by mouth daily.    Dispense:  90 tablet    Refill:  3   apixaban (ELIQUIS) 5 MG TABS tablet    Sig: Take 1 tablet (5 mg total) by mouth 2 (two) times daily.    Dispense:  60 tablet    Refill:  3     Patient Instructions  Medication Instructions:   STOP: ASPIRIN   PLEASE TAKE ELIQUIS 10mg  (2) TABLETS TWICE DAILY  FOR ONE WEEK. (SAMPLES PROVIDED)   THEN: DECREASE TO ELIQUIS to 5mg  (1) TABLET TWICE DAILY THEREAFTER  START: LISINOPRIL 5mg  ONCE DAILY   *If you  need a refill on your cardiac medications before your next appointment, please call your pharmacy*  Lab Work: [place] 1 WEEK FOR BLOOD WORK- NO APPOINTMENT NEEDED- [place] ARE Monday-Friday 8am-4pm If you have labs (blood work) drawn today and your tests are completely normal, you will receive your results only by: [place] (if you have MyChart) OR A paper copy in the mail If you have any lab test that is abnormal or we need to change your treatment, we will call you to review the results.  Follow-Up: At [facility], you and your health needs are our priority.  As part of our continuing mission to provide you with exceptional heart care, we have created designated Provider Care Teams.  These Care Teams include your primary Cardiologist (physician) and Advanced Practice Providers (APPs -  Physician Assistants and Nurse Practitioners) who all work together to provide you with the care you need, when you need it.  Your next appointment:   AS SCHEDULED   The format for your next appointment:   In Person  Provider:  Sande Rives PA-C    Signed, Donato Heinz, MD  01/30/2021 11:03 PM    [place]

## 2021-01-31 ENCOUNTER — Telehealth: Payer: Self-pay | Admitting: Cardiology

## 2021-01-31 NOTE — Telephone Encounter (Signed)
Melissa from [facility] is calling to get an update on pt's status. Lenna Sciara is unsure if she should continue OT with Ms [place]. Melissa is due to go see the pt today at 12:30pm

## 2021-01-31 NOTE — Telephone Encounter (Signed)
Melissa from [facility] is returning call to triage

## 2021-01-31 NOTE — Telephone Encounter (Signed)
Returned call to [employer]. She states that she called PCP's off and states that she does have orders from PCP to continue. She was told by the PCP to call "just to make sure" that it is OK to continue with OT due to pt's recent new blood clot. Please advise

## 2021-01-31 NOTE — Telephone Encounter (Signed)
Attempted to return call to [facility] - phone rang w/no answer. Per chart review, PCP office has been handling [place] orders, updates. Dr. Gardiner Rhyme saw patient yesterday but there was not a mention on OT in that note

## 2021-01-31 NOTE — Telephone Encounter (Signed)
Melissa returned call and was advised by the pt's PCP to get in contact with cardiology for update

## 2021-02-01 NOTE — Telephone Encounter (Signed)
Yes fine to continue OT

## 2021-02-01 NOTE — Telephone Encounter (Signed)
Melissa made aware

## 2021-02-06 LAB — CBC
Hematocrit: 39.9 % (ref 34.0–46.6)
Hemoglobin: 13.1 g/dL (ref 11.1–15.9)
MCH: 29.7 pg (ref 26.6–33.0)
MCHC: 32.8 g/dL (ref 31.5–35.7)
MCV: 91 fL (ref 79–97)
Platelets: 277 10*3/uL (ref 150–450)
RBC: 4.41 x10E6/uL (ref 3.77–5.28)
RDW: 14.1 % (ref 11.7–15.4)
WBC: 7.5 10*3/uL (ref 3.4–10.8)

## 2021-02-06 LAB — BASIC METABOLIC PANEL
BUN/Creatinine Ratio: 23 (ref 12–28)
BUN: 19 mg/dL (ref 8–27)
CO2: 22 mmol/L (ref 20–29)
Calcium: 9.2 mg/dL (ref 8.7–10.3)
Chloride: 106 mmol/L (ref 96–106)
Creatinine, Ser: 0.83 mg/dL (ref 0.57–1.00)
Glucose: 155 mg/dL — ABNORMAL HIGH (ref 70–99)
Potassium: 4.3 mmol/L (ref 3.5–5.2)
Sodium: 142 mmol/L (ref 134–144)
eGFR: 68 mL/min/{1.73_m2} (ref >59)

## 2021-02-10 DIAGNOSIS — Z9181 History of falling: Secondary | ICD-10-CM | POA: Diagnosis not present

## 2021-02-10 DIAGNOSIS — R2681 Unsteadiness on feet: Secondary | ICD-10-CM | POA: Diagnosis not present

## 2021-02-10 DIAGNOSIS — M6281 Muscle weakness (generalized): Secondary | ICD-10-CM | POA: Diagnosis not present

## 2021-02-10 DIAGNOSIS — S43085D Other dislocation of left shoulder joint, subsequent encounter: Secondary | ICD-10-CM | POA: Diagnosis not present

## 2021-02-10 DIAGNOSIS — S4292XD Fracture of left shoulder girdle, part unspecified, subsequent encounter for fracture with routine healing: Secondary | ICD-10-CM | POA: Diagnosis not present

## 2021-02-11 DIAGNOSIS — Z9011 Acquired absence of right breast and nipple: Secondary | ICD-10-CM | POA: Diagnosis not present

## 2021-02-11 DIAGNOSIS — J449 Chronic obstructive pulmonary disease, unspecified: Secondary | ICD-10-CM | POA: Diagnosis not present

## 2021-02-11 DIAGNOSIS — E43 Unspecified severe protein-calorie malnutrition: Secondary | ICD-10-CM | POA: Diagnosis not present

## 2021-02-11 DIAGNOSIS — S43085D Other dislocation of left shoulder joint, subsequent encounter: Secondary | ICD-10-CM | POA: Diagnosis not present

## 2021-02-11 DIAGNOSIS — I2510 Atherosclerotic heart disease of native coronary artery without angina pectoris: Secondary | ICD-10-CM | POA: Diagnosis not present

## 2021-02-11 DIAGNOSIS — I10 Essential (primary) hypertension: Secondary | ICD-10-CM | POA: Diagnosis not present

## 2021-02-11 DIAGNOSIS — E1151 Type 2 diabetes mellitus with diabetic peripheral angiopathy without gangrene: Secondary | ICD-10-CM | POA: Diagnosis not present

## 2021-02-11 DIAGNOSIS — E785 Hyperlipidemia, unspecified: Secondary | ICD-10-CM | POA: Diagnosis not present

## 2021-02-11 DIAGNOSIS — Z8673 Personal history of transient ischemic attack (TIA), and cerebral infarction without residual deficits: Secondary | ICD-10-CM | POA: Diagnosis not present

## 2021-02-11 DIAGNOSIS — Z853 Personal history of malignant neoplasm of breast: Secondary | ICD-10-CM | POA: Diagnosis not present

## 2021-02-11 DIAGNOSIS — S4292XD Fracture of left shoulder girdle, part unspecified, subsequent encounter for fracture with routine healing: Secondary | ICD-10-CM | POA: Diagnosis not present

## 2021-02-11 DIAGNOSIS — Z7982 Long term (current) use of aspirin: Secondary | ICD-10-CM | POA: Diagnosis not present

## 2021-02-11 DIAGNOSIS — F17210 Nicotine dependence, cigarettes, uncomplicated: Secondary | ICD-10-CM | POA: Diagnosis not present

## 2021-02-11 DIAGNOSIS — I700 Atherosclerosis of aorta: Secondary | ICD-10-CM | POA: Diagnosis not present

## 2021-02-11 DIAGNOSIS — J811 Chronic pulmonary edema: Secondary | ICD-10-CM | POA: Diagnosis not present

## 2021-02-13 ENCOUNTER — Telehealth: Payer: Self-pay

## 2021-02-13 DIAGNOSIS — S4292XD Fracture of left shoulder girdle, part unspecified, subsequent encounter for fracture with routine healing: Secondary | ICD-10-CM | POA: Diagnosis not present

## 2021-02-13 DIAGNOSIS — E785 Hyperlipidemia, unspecified: Secondary | ICD-10-CM | POA: Diagnosis not present

## 2021-02-13 DIAGNOSIS — I2510 Atherosclerotic heart disease of native coronary artery without angina pectoris: Secondary | ICD-10-CM | POA: Diagnosis not present

## 2021-02-13 DIAGNOSIS — Z7982 Long term (current) use of aspirin: Secondary | ICD-10-CM

## 2021-02-13 DIAGNOSIS — Z9011 Acquired absence of right breast and nipple: Secondary | ICD-10-CM

## 2021-02-13 DIAGNOSIS — I700 Atherosclerosis of aorta: Secondary | ICD-10-CM | POA: Diagnosis not present

## 2021-02-13 DIAGNOSIS — J449 Chronic obstructive pulmonary disease, unspecified: Secondary | ICD-10-CM | POA: Diagnosis not present

## 2021-02-13 DIAGNOSIS — E43 Unspecified severe protein-calorie malnutrition: Secondary | ICD-10-CM | POA: Diagnosis not present

## 2021-02-13 DIAGNOSIS — Z853 Personal history of malignant neoplasm of breast: Secondary | ICD-10-CM

## 2021-02-13 DIAGNOSIS — Z8673 Personal history of transient ischemic attack (TIA), and cerebral infarction without residual deficits: Secondary | ICD-10-CM

## 2021-02-13 DIAGNOSIS — F0390 Unspecified dementia without behavioral disturbance: Secondary | ICD-10-CM

## 2021-02-13 DIAGNOSIS — J811 Chronic pulmonary edema: Secondary | ICD-10-CM | POA: Diagnosis not present

## 2021-02-13 DIAGNOSIS — E1151 Type 2 diabetes mellitus with diabetic peripheral angiopathy without gangrene: Secondary | ICD-10-CM | POA: Diagnosis not present

## 2021-02-13 DIAGNOSIS — F17210 Nicotine dependence, cigarettes, uncomplicated: Secondary | ICD-10-CM | POA: Diagnosis not present

## 2021-02-13 DIAGNOSIS — I10 Essential (primary) hypertension: Secondary | ICD-10-CM | POA: Diagnosis not present

## 2021-02-13 DIAGNOSIS — S43085D Other dislocation of left shoulder joint, subsequent encounter: Secondary | ICD-10-CM | POA: Diagnosis not present

## 2021-02-13 NOTE — Telephone Encounter (Signed)
Plan of care signed and faxed back to [facility] at [phone]. Form sent for scanning.

## 2021-02-14 DIAGNOSIS — Z7982 Long term (current) use of aspirin: Secondary | ICD-10-CM | POA: Diagnosis not present

## 2021-02-14 DIAGNOSIS — I10 Essential (primary) hypertension: Secondary | ICD-10-CM | POA: Diagnosis not present

## 2021-02-14 DIAGNOSIS — S4292XD Fracture of left shoulder girdle, part unspecified, subsequent encounter for fracture with routine healing: Secondary | ICD-10-CM | POA: Diagnosis not present

## 2021-02-14 DIAGNOSIS — I700 Atherosclerosis of aorta: Secondary | ICD-10-CM | POA: Diagnosis not present

## 2021-02-14 DIAGNOSIS — J811 Chronic pulmonary edema: Secondary | ICD-10-CM | POA: Diagnosis not present

## 2021-02-14 DIAGNOSIS — E43 Unspecified severe protein-calorie malnutrition: Secondary | ICD-10-CM | POA: Diagnosis not present

## 2021-02-14 DIAGNOSIS — I2510 Atherosclerotic heart disease of native coronary artery without angina pectoris: Secondary | ICD-10-CM | POA: Diagnosis not present

## 2021-02-14 DIAGNOSIS — S43085D Other dislocation of left shoulder joint, subsequent encounter: Secondary | ICD-10-CM | POA: Diagnosis not present

## 2021-02-14 DIAGNOSIS — E785 Hyperlipidemia, unspecified: Secondary | ICD-10-CM | POA: Diagnosis not present

## 2021-02-14 DIAGNOSIS — Z853 Personal history of malignant neoplasm of breast: Secondary | ICD-10-CM | POA: Diagnosis not present

## 2021-02-14 DIAGNOSIS — Z9011 Acquired absence of right breast and nipple: Secondary | ICD-10-CM | POA: Diagnosis not present

## 2021-02-14 DIAGNOSIS — Z8673 Personal history of transient ischemic attack (TIA), and cerebral infarction without residual deficits: Secondary | ICD-10-CM | POA: Diagnosis not present

## 2021-02-14 DIAGNOSIS — J449 Chronic obstructive pulmonary disease, unspecified: Secondary | ICD-10-CM | POA: Diagnosis not present

## 2021-02-14 DIAGNOSIS — F17210 Nicotine dependence, cigarettes, uncomplicated: Secondary | ICD-10-CM | POA: Diagnosis not present

## 2021-02-14 DIAGNOSIS — E1151 Type 2 diabetes mellitus with diabetic peripheral angiopathy without gangrene: Secondary | ICD-10-CM | POA: Diagnosis not present

## 2021-02-18 ENCOUNTER — Other Ambulatory Visit (HOSPITAL_COMMUNITY): Payer: Self-pay | Admitting: Student

## 2021-02-18 ENCOUNTER — Ambulatory Visit (HOSPITAL_COMMUNITY)
Admission: RE | Admit: 2021-02-18 | Discharge: 2021-02-18 | Disposition: A | Discharge status: Home / Self Care | Payer: Medicare Other | Source: Ambulatory Visit | Attending: Cardiovascular Disease | Admitting: Cardiovascular Disease

## 2021-02-18 ENCOUNTER — Other Ambulatory Visit: Payer: Self-pay

## 2021-02-18 DIAGNOSIS — I6523 Occlusion and stenosis of bilateral carotid arteries: Secondary | ICD-10-CM | POA: Diagnosis not present

## 2021-02-19 ENCOUNTER — Telehealth: Payer: Self-pay

## 2021-02-19 DIAGNOSIS — J449 Chronic obstructive pulmonary disease, unspecified: Secondary | ICD-10-CM | POA: Diagnosis not present

## 2021-02-19 DIAGNOSIS — I2510 Atherosclerotic heart disease of native coronary artery without angina pectoris: Secondary | ICD-10-CM | POA: Diagnosis not present

## 2021-02-19 DIAGNOSIS — Z853 Personal history of malignant neoplasm of breast: Secondary | ICD-10-CM | POA: Diagnosis not present

## 2021-02-19 DIAGNOSIS — Z7982 Long term (current) use of aspirin: Secondary | ICD-10-CM | POA: Diagnosis not present

## 2021-02-19 DIAGNOSIS — I700 Atherosclerosis of aorta: Secondary | ICD-10-CM | POA: Diagnosis not present

## 2021-02-19 DIAGNOSIS — S43085D Other dislocation of left shoulder joint, subsequent encounter: Secondary | ICD-10-CM | POA: Diagnosis not present

## 2021-02-19 DIAGNOSIS — E1151 Type 2 diabetes mellitus with diabetic peripheral angiopathy without gangrene: Secondary | ICD-10-CM | POA: Diagnosis not present

## 2021-02-19 DIAGNOSIS — Z8673 Personal history of transient ischemic attack (TIA), and cerebral infarction without residual deficits: Secondary | ICD-10-CM | POA: Diagnosis not present

## 2021-02-19 DIAGNOSIS — E43 Unspecified severe protein-calorie malnutrition: Secondary | ICD-10-CM | POA: Diagnosis not present

## 2021-02-19 DIAGNOSIS — I10 Essential (primary) hypertension: Secondary | ICD-10-CM | POA: Diagnosis not present

## 2021-02-19 DIAGNOSIS — S4292XD Fracture of left shoulder girdle, part unspecified, subsequent encounter for fracture with routine healing: Secondary | ICD-10-CM | POA: Diagnosis not present

## 2021-02-19 DIAGNOSIS — F17210 Nicotine dependence, cigarettes, uncomplicated: Secondary | ICD-10-CM | POA: Diagnosis not present

## 2021-02-19 DIAGNOSIS — Z9011 Acquired absence of right breast and nipple: Secondary | ICD-10-CM | POA: Diagnosis not present

## 2021-02-19 DIAGNOSIS — J811 Chronic pulmonary edema: Secondary | ICD-10-CM | POA: Diagnosis not present

## 2021-02-19 DIAGNOSIS — E785 Hyperlipidemia, unspecified: Secondary | ICD-10-CM | POA: Diagnosis not present

## 2021-02-19 NOTE — Telephone Encounter (Addendum)
Left voice message asking to give office a call back for results or to check MyChart for results.  ----- Message from Ledora Bottcher, [place] sent at 02/19/2021 10:28 AM EST ----- Your carotid ultrasound showed plaque in your carotids, but this does not appear to be obstructing blood flow. This appears stable from your last scan.

## 2021-02-21 DIAGNOSIS — I2510 Atherosclerotic heart disease of native coronary artery without angina pectoris: Secondary | ICD-10-CM | POA: Diagnosis not present

## 2021-02-21 DIAGNOSIS — S4292XD Fracture of left shoulder girdle, part unspecified, subsequent encounter for fracture with routine healing: Secondary | ICD-10-CM | POA: Diagnosis not present

## 2021-02-21 DIAGNOSIS — Z853 Personal history of malignant neoplasm of breast: Secondary | ICD-10-CM | POA: Diagnosis not present

## 2021-02-21 DIAGNOSIS — Z7982 Long term (current) use of aspirin: Secondary | ICD-10-CM | POA: Diagnosis not present

## 2021-02-21 DIAGNOSIS — Z9011 Acquired absence of right breast and nipple: Secondary | ICD-10-CM | POA: Diagnosis not present

## 2021-02-21 DIAGNOSIS — E785 Hyperlipidemia, unspecified: Secondary | ICD-10-CM | POA: Diagnosis not present

## 2021-02-21 DIAGNOSIS — E1151 Type 2 diabetes mellitus with diabetic peripheral angiopathy without gangrene: Secondary | ICD-10-CM | POA: Diagnosis not present

## 2021-02-21 DIAGNOSIS — S43085D Other dislocation of left shoulder joint, subsequent encounter: Secondary | ICD-10-CM | POA: Diagnosis not present

## 2021-02-21 DIAGNOSIS — J811 Chronic pulmonary edema: Secondary | ICD-10-CM | POA: Diagnosis not present

## 2021-02-21 DIAGNOSIS — I700 Atherosclerosis of aorta: Secondary | ICD-10-CM | POA: Diagnosis not present

## 2021-02-21 DIAGNOSIS — I10 Essential (primary) hypertension: Secondary | ICD-10-CM | POA: Diagnosis not present

## 2021-02-21 DIAGNOSIS — Z8673 Personal history of transient ischemic attack (TIA), and cerebral infarction without residual deficits: Secondary | ICD-10-CM | POA: Diagnosis not present

## 2021-02-21 DIAGNOSIS — J449 Chronic obstructive pulmonary disease, unspecified: Secondary | ICD-10-CM | POA: Diagnosis not present

## 2021-02-21 DIAGNOSIS — F17210 Nicotine dependence, cigarettes, uncomplicated: Secondary | ICD-10-CM | POA: Diagnosis not present

## 2021-02-21 DIAGNOSIS — E43 Unspecified severe protein-calorie malnutrition: Secondary | ICD-10-CM | POA: Diagnosis not present

## 2021-02-27 NOTE — Progress Notes (Signed)
Cardiology Office Note:    Date:  03/11/2021   ID:  SYLVESTER SALONGA, DOB 1932/01/04, MRN [id number]  PCP:  Colon Branch, MD  Cardiologist:  Quay Burow, MD  Electrophysiologist:  None   Referring MD: Colon Branch, MD   Chief Complaint: follow-up of dizziness and DVT  History of Present Illness:    RONESHIA DREW is a 88 y.o. female with a history of apical hypertrophic cardiomyopathy noted on recent Echo in 09/2020, palpitations with frequent PVCs (15% burden) noted on monitor in 09/2020, PAD s/p atherectomy/PTA/stenting of bilateral ostial iliac arteries in 11/2015, bilateral carotid stenosis (left > right), small AAA, CVA, COPD, hypertension, hyperlipidemia, type 2 diabetes mellitus, hypothyroidism, and continued tobacco abuse who is followed by Dr. Gwenlyn Found and presents today for follow-up of dizziness and DVT.   Patient was initially referred to Dr. Gwenlyn Found in 11/2015 for bilateral claudication. Dopplers showed showed ABI of 0.68 on the right and 0.78 on the left with high frequency signals in the origins of both iliac arteries.  Peripheral angiogram which showed 95% stenosis of bilateral ostial common iliac artery and he underwent successful atherectomy/stenting of these lesions bilaterally. She also has a history of bilateral carotid stenosis and small AAA. Last carotid ultrasound in 2017 showed significant atherosclerotic plaque at both carotid bifurcations with 50-69% stenosis of right ICA and >70% stenosis of left ICA. Most recent ultrasounds in 08/2020 showed abnormal dilatation of the mid abdominal aorta with the largest measurement being 3.6cm (stable from prior study). ABI was 0.79 on the left (slightly decreased compared to prior study) and 0.86 on the right.   Patient has not been seen by Dr. Gwenlyn Found since 2020; however, he was added on to Dr. Newman Nickels DOD schedule on 09/13/2020 for further evaluation of dizziness and near syncope that had been occurring several times per day. BP was soft in  the office so Felodipine was stopped. Echo and Zio monitor were ordered for further evaluation. Echo showed significant hypertrophy of the LV apex with slit like cavity of the LV apex. Frequent ectopy made assessment of LVEF difficult but it looked mildly decreased. Also showed mild MR and mild to moderate TR. Cardiac MR was recommended if patient was willing. Monitor showed frequent PVCs (15%), a few short runs of SVT, and one short run of NSVT. Patient declined cardiac MRI at follow-up visit.   Patient was admitted from 10/10/2020 to 10/19/2020 for pneumonia complicated by delirium and was treated with antibiotics. She also was noted to have dysphagia. MBS was done and speech therapist recommended dysphagia 3 diet with nectar thick liquids. She was discharged to a SNF. She was readmitted from 11/07/2020 to 11/13/2020 for a left shoulder fracture/dislocation after a mechanical fall at home and underwent closed reduction in the OR. Of note, BNP was elevated at 2,830 and chest x-ray showed bilateral interstitial thickening concerning for mild interstitial edema with a small left pleural effusion. However, she was not felt to be volume overloaded on exam and she had no respiratory compromise. She was not diuresed due to concern that this would drop preload unnecessarily.   She was last seen by me for follow-up on 01/21/2021 at which time her main complaint was left shoulder pain but she was stable from a cardiac standpoint. She reported chronic shortness of breath which was stable and her dizziness had improved but she continued to have unsteadiness on her feet and balance issues. However, she had new lower extremity edema (left larger than right).  She was started on Lasix for 3 days and D-dimer was ordered which came back elevated. Therefore, lower extremity doppler was ordered and revealed an acute DVT involving the left femoral vein so she was started on Eliquis.   Patient presents today for follow-up. Here with  son. Patient is overall doing well from a cardiac standpoint. Her biggest blood complaint today continues to be her left shoulder pain.  She has spoken with Ortho and they have given her 2 options - a steroid injection or surgery.  Per patient and son, Ortho would prefer not to do surgery as they do not believe this will really help.  Patient also does not want to have surgery. She is also reluctant to have a steroid injection as one of her son's had a bad reaction to this in the past (although sounds like he was on chronic steroids). From a heart perspective though, she is doing well. She still has occasional dizziness/lightheadedness but much better. No syncope. She denies any palpitations, chest pain, shortness of breath, orthopnea, or PND. She has intermittent lower extremity that is improved from last visit and relatively well controlled with PRN Lasix. She is still very unsteady on her feet and had one recent fall but did not hit her head. She denies any abnormal bleeding on the Eliquis.  Past Medical History:  Diagnosis Date   AAA (abdominal aortic aneurysm)    sees Dr Eden Lathe    B12 deficiency    Cancer of right breast [facility]) 1960   Carotid artery disease 11/28/2008   sees Dr Oneida Alar    COPD (chronic obstructive pulmonary disease) ([place]) 01/25/2014   CVA (cerebrovascular accident) ([place]) 2006   Diabetes mellitus ([place]) 05/29/2010   A1c 6.0 on October 2011    Frequent PVCs    Hyperlipidemia    Hypertension    Osteoporosis 2003   PAD (peripheral artery disease) ([facility])    Palpitations    frequent PVCs (15% burden) noted on monitor in 09/2020   Pneumonia ~ 2014; ~ 2015   Prolapse of female bladder, acquired    Shingles    repeated episone 9/11 went to a UC   TIA (transient ischemic attack) ~ 2005   Transient hypothyroidism    d/t thyroidectomy   UTI (lower urinary tract infection) 06/2011    Past Surgical History:  Procedure Laterality Date   ABDOMINAL HYSTERECTOMY     no oophorectomy    CATARACT EXTRACTION W/ INTRAOCULAR LENS  IMPLANT, BILATERAL Bilateral    DILATION AND CURETTAGE OF UTERUS     MASTECTOMY, RADICAL Right 1960s   remotely    PERIPHERAL VASCULAR CATHETERIZATION  12/03/2015   PERIPHERAL VASCULAR CATHETERIZATION N/A 12/03/2015   Procedure: Abdominal Aortogram;  Surgeon: Lorretta Harp, MD;  Location: [place] [facility];  Service: Cardiovascular;  Laterality: N/A;   PERIPHERAL VASCULAR CATHETERIZATION Bilateral 12/03/2015   Procedure: Lower Extremity Angiography;  Surgeon: Lorretta Harp, MD;  Location: [place] [facility];  Service: Cardiovascular;  Laterality: Bilateral;   PERIPHERAL VASCULAR CATHETERIZATION Bilateral 12/03/2015   Procedure: Peripheral Vascular Intervention;  Surgeon: Lorretta Harp, MD;  Location: [place] [facility];  Service: Cardiovascular;  Laterality: Bilateral;  32mmx58mm Lifestream bilateral Common Iliac Arteries   PERIPHERAL VASCULAR CATHETERIZATION Bilateral 12/03/2015   Procedure: Peripheral Vascular Atherectomy;  Surgeon: Lorretta Harp, MD;  Location: [place] [facility];  Service: Cardiovascular;  Laterality: Bilateral;  Common Iliac Arteries   SHOULDER CLOSED REDUCTION Left 11/08/2020   Procedure: CLOSED MANIPULATION SHOULDER;  Surgeon: Tania Ade, MD;  Location: [facility];  Service: Orthopedics;  Laterality: Left;   THYROIDECTOMY      Current Medications: Current Meds  Medication Sig   acetaminophen (TYLENOL) 500 MG tablet Take 2 tablets (1,000 mg total) by mouth every 6 (six) hours as needed (for pain).   albuterol (VENTOLIN HFA) 108 (90 Base) MCG/ACT inhaler INHALE 2 PUFFS INTO THE LUNGS EVERY 6 HOURS AS NEEDED FOR WHEEZING OR SHORTNESS OF BREATH   apixaban (ELIQUIS) 5 MG TABS tablet Take 1 tablet (5 mg total) by mouth 2 (two) times daily.   atorvastatin (LIPITOR) 40 MG tablet TAKE 1 TABLET(40 MG) BY MOUTH DAILY (Patient taking differently: Take 40 mg by mouth daily.)   Cholecalciferol (VITAMIN D3 PO) Take 1 tablet by  mouth daily.   furosemide (LASIX) 20 MG tablet Take 1 tablet (20 mg total) by mouth as needed for edema.   hydroxypropyl methylcellulose / hypromellose (ISOPTO TEARS / GONIOVISC) 2.5 % ophthalmic solution Place 1 drop into both eyes 2 (two) times daily as needed for dry eyes.   lisinopril (ZESTRIL) 5 MG tablet Take 1 tablet (5 mg total) by mouth daily.   metoprolol succinate (TOPROL XL) 25 MG 24 hr tablet Take 0.5 tablets (12.5 mg total) by mouth daily. (Patient taking differently: Take 12.5 mg by mouth 2 (two) times daily.)   vitamin B-12 (CYANOCOBALAMIN) 1000 MCG tablet Take 1,000 mcg by mouth daily.     Allergies:   Septra [sulfamethoxazole-trimethoprim], Tramadol, Ciprofloxacin hcl, and Codeine   Social History   Socioeconomic History   Marital status: Divorced    Spouse name: Not on file   Number of children: 3   Years of education: Not on file   Highest education level: Not on file  Occupational History   Occupation: retired   Tobacco Use   Smoking status: Every Day    Packs/day: 0.50    Years: 67.00    Pack years: 33.50    Types: Cigarettes   Smokeless tobacco: Never   Tobacco comments:    1/2 ppd  Vaping Use   Vaping Use: Never used  Substance and Sexual Activity   Alcohol use: No    Alcohol/week: 0.0 standard drinks   Drug use: No   Sexual activity: Never  Other Topics Concern   Not on file  Social History Narrative   Lives by herself , still drives some    Has 3 children, 1 living son    Emergency contact: Anabell Swint [phone], he lives in the [place] area   Lost a son ~ 2017, he had  lung ca, stroke (09-2011)    Lost another son Dominica Severin) 2019-02-21     Lost a sister died 21-15           Social Determinants of Health   Financial Resource Strain: Not on file  Food Insecurity: Not on file  Transportation Needs: Not on file  Physical Activity: Not on file  Stress: Not on file  Social Connections: Not on file     Family History: The patient's family history  includes Cancer in an other family member. There is no history of Colon cancer, Coronary artery disease, Diabetes type II, or Breast cancer.  ROS:   Please see the history of present illness.     EKGs/Labs/Other Studies Reviewed:    The following studies were reviewed today:  Aorta/IVC/Iliacs Ultrasound 08/20/2020: Summary:  Abdominal Aorta: Aortoiliac atherosclerosis. There is evidence of abnormal  dilatation of the mid Abdominal  aorta. The largest aortic measurement is  3.6 cm. Prominent proximal left external iliac artery with dampened and  turbulent flow, measuring 1.4 cm.  The largest aortic diameter remains essentially unchanged compared to  prior exam. Previous diameter measurement was 3.6 cm obtained on  09/05/2019.   Stenosis:  +--------------------+-------------+---------------+-----------------------  ----+   Location             Stenosis      Stent           Comments                       +--------------------+-------------+---------------+-----------------------  ----+   Right Common Iliac                 50-99% stenosis Stable velocities               +--------------------+-------------+---------------+-----------------------  ----+   Left Common Iliac                  50-99% stenosis Stable velocities               +--------------------+-------------+---------------+-----------------------  ----+   Right External Iliac >50% stenosis                 Increase velocities in  the                                                        proximal segment when                                                               compared to prior exam         +--------------------+-------------+---------------+-----------------------  ----+   Left External Iliac  <50% stenosis                 Decrease velocities                                                                 compared to prior exam          +--------------------+-------------+---------------+-----------------------  ----+   IVC/Iliac: Patent IVC.    ABI Summary: Right: Resting right ankle-brachial index indicates mild right lower  extremity arterial disease. The right toe-brachial index is normal.   TBIs increased by .15.  Left: Resting left ankle-brachial index indicates moderate left lower  extremity arterial disease. The left toe-brachial index is abnormal.   ABIs decreased by .02 and TBIs increased by .07.   Right ABIs and left TBIs appear essentially unchanged compared to prior study on 09/05/2019. Left ABIs appear decreased compared to prior study on 09/05/2019. Right TBIs appear increased compared to prior study on 09/05/2019.  _______________   Zio Monitor 09/16/2020 to 09/19/2020: Frequent PVCs (15% of beats) 7 episodes of SVT, longest lasting 14 seconds with average rate 107 bpm 1 episode of NSVT lasting  8 beats _______________   Echocardiogram 10/01/2020: Impressions:  1. Significant hypertrophy of LV apex with slit like cavity of LV apex.  Frequent ectopy/bigeminy, makes assessment of LVEF difficult OVerall LVEF  appears mildly decreased.. The left ventricular internal cavity size was  mildly dilated.   2. Right ventricular systolic function is low normal. The right  ventricular size is normal. There is mildly elevated pulmonary artery  systolic pressure.   3. Left atrial size was mildly dilated.   4. Mild mitral valve regurgitation.   5. Tricuspid valve regurgitation is mild to moderate.   6. The aortic valve is tricuspid. Aortic valve regurgitation is not  visualized. Mild to moderate aortic valve sclerosis/calcification is  present, without any evidence of aortic stenosis.   7. The inferior vena cava is normal in size with greater than 50%  respiratory variability, suggesting right atrial pressure of 3 mmHg.  _______________  Lower Extremity Venous Doppler 01/30/2021: Summary:  RIGHT:  -  Findings consistent with age indeterminate deep vein thrombosis  involving the right soleal veins.  - No cystic structure found in the popliteal fossa.  - All other veins visualized appear fully compressible and demonstrate  appropriate Doppler characteristics.     LEFT:  - Findings consistent with acute deep vein thrombosis involving the left  femoral vein.  - No cystic structure found in the popliteal fossa.  - All other veins visualized appear fully compressible and demonstrate  appropriate Doppler characteristics.  _______________  Carotid Ultrasound 02/18/2021: Summary: - Right Carotid: Velocities in the right ICA are consistent with a 40-59%  stenosis. Non-hemodynamically significant plaque <50% noted in the CCA. The ECA appears >50% stenosed.  - Left Carotid: Velocities in the left ICA are consistent with a 40-59%  stenosis. Non-hemodynamically significant plaque <50% noted in the  CCA. The ECA appears >50% stenosed.  - Vertebrals:  Bilateral vertebral arteries demonstrate antegrade flow.  - Subclavians: Left subclavian artery was stenotic. Normal flow hemodynamics were seen in the right subclavian artery.  EKG:  EKG not ordered today.   Recent Labs: 10/11/2020: TSH 1.765 10/18/2020: ALT 25 11/08/2020: B Natriuretic Peptide 2,830.3 01/21/2021: Magnesium 2.0 02/06/2021: BUN 19; Creatinine, Ser 0.83; Hemoglobin 13.1; Platelets 277; Potassium 4.3; Sodium 142  Recent Lipid Panel    Component Value Date/Time   CHOL 134 10/24/2019 1154   TRIG 117 10/24/2019 1154   TRIG 112 01/06/2006 0841   HDL 51 10/24/2019 1154   CHOLHDL 2.6 10/24/2019 1154   VLDL 21.8 07/19/2018 0919   LDLCALC 63 10/24/2019 1154   LDLDIRECT 149.2 08/03/2006 1054    Physical Exam:    Vital Signs: BP 124/72    Pulse 74    Ht 5\' 5"  (1.651 m)    Wt 105 lb (47.6 kg)    SpO2 96%    BMI 17.47 kg/m     Wt Readings from Last 3 Encounters:  03/11/21 105 lb (47.6 kg)  01/30/21 102 lb (46.3 kg)  01/21/21 102 lb  (46.3 kg)     General: 88 y.o. frail Caucasian female in no acute distress. HEENT: Normocephalic and atraumatic. Sclera clear.  Neck: Supple. No JVD. Heart: RRR. Distinct S1 and S2. No murmurs, gallops, or rubs. Radial pulses 2+ and equal bilaterally. Lungs: No increased work of breathing. Clear to ausculation bilaterally. No wheezes, rhonchi, or rales.  Abdomen: Soft, non-distended, and non-tender to palpation.  Extremities: Mild ankle edema bilaterally. Skin: Warm and dry. Neuro: Alert and oriented x3. No focal deficits. Psych: Normal affect.  Responds appropriately.  Assessment:    1. Apical variant hypertrophic cardiomyopathy ([place])   2. Bilateral carotid artery stenosis   3. Stenosis of left subclavian artery ([facility])   4. PAD (peripheral artery disease) ([place])   5. Abdominal aortic aneurysm (AAA) without rupture, unspecified part   6. Primary hypertension   7. Hyperlipidemia, unspecified hyperlipidemia type   8. Acute deep vein thrombosis (DVT) of proximal vein of left lower extremity ([place])   9. Chronic left shoulder pain     Plan:    Dizziness Frequent PVCs Patient has had dizziness/near syncope the last couple of months. Echo in 09/2020 showed significant hypertrophy of the LV apex with slit like cavity of the LV apex. Frequent ectopy made assessment of LVEF difficult but it looked mildly decreased. Also showed mild MR and mild to moderate TR. Monitor in 09/2020 showed frequent PVCs (15%), a few short runs of SVT, and one short run of NSVT. Felodipine and Lisinopril were stopped and Toprol-XL was started.  - Dizziness improved. No ectopy noted on exam.  - Continue Toprol-XL 12.5mg  daily.   Apical Hypertrophic Cardiomyopathy Echo in 09/2020 showed significant hypertrophy of the LV apex with slit like cavity of the LV apex. Cardiac MRI was recommended but patient declined this and any other invasive procedure.  - Appear euvolemic on exam.  - Continue Lasix 20mg  PRN for weight gain  and lower extremity edema.   Bilateral Carotid Stenosis  Left Subclavian Artery Stenosis Recent carotid ultrasound on 02/18/2021 showed 40-59% stenosis of bilateral ICAs and stenosis left subclavian artery.   - No Aspirin while on Eliquis. - Continue statin.   PAD Small AAA S/p atherectomy/PTA/stenting of bilateral ostial iliac arteries in 11/2015. Most recent ultrasounds showed 08/2020 showed abnormal dilatation of the mid abdominal aorta with the largest measurement being 3.6cm (stable from prior study). ABI was 0.79 on the left (slightly decreased compared to prior study) and 0.86 on the right. - No Aspirin while on Eliquis. - Continue statin.  - Follow-up with Dr. Gwenlyn Found.   Hypertension BP well controlled. - Continue Tropol-XL 12.5mg  daily. - Previously on Lisinopril but this was stopped given dizziness, soft BP, and need for beta-blocker.    Hyperlipidemia Most recent lipid panel in 10/2019: Total Cholesterol 134, triglycerides 117, HDL 51, LDL 63. - Continue Lipitor 40mg  daily.  - Labs followed by PCP.   Type 2 Diabetes Mellitus Hemoglobin A1c 5.7 in 05/2020. - Management per PCP.   Left Lower Extremity DVT Lower extremity venous doppler on 01/30/2021 showed acute DVT involving the left femoral vein. - Continue Eliquis 5mg  twice daily. Will need treatment for a minimum of 3 months.  - Will repeat lower extremity dopplers in 3 months.     Left Shoulder Pain Patient fractured/dislocated her left shoulder in 10/2020. She underwent underwent closed reduction in the OR at that time. She is still having significant pain and mobility issues with that arm. She has been seen by Ortho and they discussed reverse total surgery replacement vs steroid injection. They are hesitant to do surgery given they do not expect the mobility of her arm to improve much and she is felt to be at higher risk given significant smoking history and cardiac history. Patient does not want surgery and is reluctant to  have a steroid injection but is still having significant shoulder pain. - Reviewed Pharmacy's recommended for pain control options - Tylenol and topical Voltaren gel. Otherwise, will defer pain management to Ortho or PCP. - If patient  decides to have surgery, would recommend Myoview prior to surgery as she is very sedentary. I offered to order this last visit but patient declined because she did not want to have surgery. Patient still does not want to have surgery.   Disposition: Follow up in 3 months after repeat lower extremity dopplers.   Medication Adjustments/Labs and Tests Ordered: Current medicines are reviewed at length with the patient today.  Concerns regarding medicines are outlined above.  Orders Placed This Encounter  Procedures   VAS [place] LOWER EXTREMITY VENOUS (DVT)   No orders of the defined types were placed in this encounter.   Patient Instructions  Medication Instructions:  Your physician recommends that you continue on your current medications as directed. Please refer to the Current Medication list given to you today.   *If you need a refill on your cardiac medications before your next appointment, please call your pharmacy*   Lab Work: NONE ordered at this time of appointment   If you have labs (blood work) drawn today and your tests are completely normal, you will receive your results only by: [place] (if you have MyChart) OR A paper copy in the mail If you have any lab test that is abnormal or we need to change your treatment, we will call you to review the results.   Testing/Procedures: Your physician has requested that you have a lower or upper extremity venous duplex. This test is an ultrasound of the veins in the legs or arms. It looks at venous blood flow that carries blood from the heart to the legs or arms. Allow one hour for a Lower Venous exam. Allow thirty minutes for an Upper Venous exam. There are no restrictions or special instructions.    Please schedule for 3 month  Follow-Up: At [facility], you and your health needs are our priority.  As part of our continuing mission to provide you with exceptional heart care, we have created designated Provider Care Teams.  These Care Teams include your primary Cardiologist (physician) and Advanced Practice Providers (APPs -  Physician Assistants and Nurse Practitioners) who all work together to provide you with the care you need, when you need it.  We recommend signing up for the patient portal called "MyChart".  Sign up information is provided on this After Visit Summary.  MyChart is used to connect with patients for Virtual Visits (Telemedicine).  Patients are able to view lab/test results, encounter notes, upcoming appointments, etc.  Non-urgent messages can be sent to your provider as well.   To learn more about what you can do with MyChart, go to [email or web address].    Your next appointment:   3 month(s) After Doppler   The format for your next appointment:   In Person  Provider:   Quay Burow, MD  or Sande Rives, PA-C        Other Instructions NONE ordered at this time of appointment      Signed, Darreld Mclean, PA-C  03/11/2021 12:08 PM    [place]

## 2021-02-28 ENCOUNTER — Telehealth: Payer: Self-pay | Admitting: Cardiovascular Disease

## 2021-02-28 NOTE — Telephone Encounter (Signed)
Son was returning call. Please advise  

## 2021-02-28 NOTE — Telephone Encounter (Signed)
I can not suggest anything because I do not know what is being treated

## 2021-02-28 NOTE — Telephone Encounter (Signed)
Left detailed Message-we usually do not rx pain medication, JB at the hospital. We will recommend pain medication?

## 2021-02-28 NOTE — Telephone Encounter (Signed)
Spoke with pt's son regarding need for pain medication. Son states that she was seen by the orthopedic surgeon after her shoulder had to be reset s/p fall at home. Son states that pt is in a great deal of pain with no very many option. Orthopedic offered surgery, which is not a good option for pt, additional option was Cortizone shots which the pt does not like that option either. Pt was told that orthopedic is apprehensive about prescribing pt pain medication due to her list of cardiac meds and blood thinner. Son states that orthopedic suggested tylenol, however this does not seem to help much. Son states that pt in the past did not do well with tramadol or medications with codeine in them. Will send to PharmD to advise.

## 2021-02-28 NOTE — Telephone Encounter (Signed)
Patient's son calling for a pain medication that won't interact with her other medications. He says her ortho doctor won't prescribe anything since she is under Dr. Kennon Holter care. He says she needs something before her appointment 1/30.

## 2021-02-28 NOTE — Telephone Encounter (Signed)
Spoke with pt's son regarding PharmD's recommendations. Son verbalizes understanding.

## 2021-02-28 NOTE — Telephone Encounter (Signed)
Best recommendation would be Tylenol.  Can also try topical Voltaren gel

## 2021-02-28 NOTE — Telephone Encounter (Signed)
What is being treated?  Agree, this office does not typically prescribe pain medication, especially if patient is seeing ortho.  Suggest he contact PCP

## 2021-03-02 DIAGNOSIS — S43085D Other dislocation of left shoulder joint, subsequent encounter: Secondary | ICD-10-CM | POA: Diagnosis not present

## 2021-03-02 DIAGNOSIS — S4292XD Fracture of left shoulder girdle, part unspecified, subsequent encounter for fracture with routine healing: Secondary | ICD-10-CM | POA: Diagnosis not present

## 2021-03-02 DIAGNOSIS — M6281 Muscle weakness (generalized): Secondary | ICD-10-CM | POA: Diagnosis not present

## 2021-03-02 DIAGNOSIS — Z9181 History of falling: Secondary | ICD-10-CM | POA: Diagnosis not present

## 2021-03-02 DIAGNOSIS — R2681 Unsteadiness on feet: Secondary | ICD-10-CM | POA: Diagnosis not present

## 2021-03-04 ENCOUNTER — Telehealth: Payer: Self-pay | Admitting: Internal Medicine

## 2021-03-04 NOTE — Telephone Encounter (Signed)
Left message for patient to call back and schedule Medicare Annual Wellness Visit (AWV) in office.   If not able to come in office, please offer to do virtually or by telephone.  Left office number and my jabber [phone].  Last AWV:03/19/2017  Please schedule at anytime with Nurse Health Advisor.

## 2021-03-10 ENCOUNTER — Encounter: Payer: Self-pay | Admitting: Student

## 2021-03-11 ENCOUNTER — Other Ambulatory Visit: Payer: Self-pay

## 2021-03-11 ENCOUNTER — Ambulatory Visit (INDEPENDENT_AMBULATORY_CARE_PROVIDER_SITE_OTHER): Payer: Medicare Other | Admitting: Student

## 2021-03-11 ENCOUNTER — Encounter: Payer: Self-pay | Admitting: Student

## 2021-03-11 VITALS — BP 124/72 | HR 74 | Ht 65.0 in | Wt 105.0 lb

## 2021-03-11 DIAGNOSIS — I771 Stricture of artery: Secondary | ICD-10-CM

## 2021-03-11 DIAGNOSIS — G8929 Other chronic pain: Secondary | ICD-10-CM | POA: Diagnosis not present

## 2021-03-11 DIAGNOSIS — M25512 Pain in left shoulder: Secondary | ICD-10-CM

## 2021-03-11 DIAGNOSIS — I739 Peripheral vascular disease, unspecified: Secondary | ICD-10-CM

## 2021-03-11 DIAGNOSIS — I824Y2 Acute embolism and thrombosis of unspecified deep veins of left proximal lower extremity: Secondary | ICD-10-CM | POA: Diagnosis not present

## 2021-03-11 DIAGNOSIS — E785 Hyperlipidemia, unspecified: Secondary | ICD-10-CM | POA: Diagnosis not present

## 2021-03-11 DIAGNOSIS — I6523 Occlusion and stenosis of bilateral carotid arteries: Secondary | ICD-10-CM

## 2021-03-11 DIAGNOSIS — I10 Essential (primary) hypertension: Secondary | ICD-10-CM | POA: Diagnosis not present

## 2021-03-11 DIAGNOSIS — I422 Other hypertrophic cardiomyopathy: Secondary | ICD-10-CM

## 2021-03-11 DIAGNOSIS — I714 Abdominal aortic aneurysm, without rupture, unspecified: Secondary | ICD-10-CM

## 2021-03-11 NOTE — Patient Instructions (Signed)
Medication Instructions:  Your physician recommends that you continue on your current medications as directed. Please refer to the Current Medication list given to you today.   *If you need a refill on your cardiac medications before your next appointment, please call your pharmacy*   Lab Work: NONE ordered at this time of appointment   If you have labs (blood work) drawn today and your tests are completely normal, you will receive your results only by: [place] (if you have MyChart) OR A paper copy in the mail If you have any lab test that is abnormal or we need to change your treatment, we will call you to review the results.   Testing/Procedures: Your physician has requested that you have a lower or upper extremity venous duplex. This test is an ultrasound of the veins in the legs or arms. It looks at venous blood flow that carries blood from the heart to the legs or arms. Allow one hour for a Lower Venous exam. Allow thirty minutes for an Upper Venous exam. There are no restrictions or special instructions.   Please schedule for 3 month  Follow-Up: At [facility], you and your health needs are our priority.  As part of our continuing mission to provide you with exceptional heart care, we have created designated Provider Care Teams.  These Care Teams include your primary Cardiologist (physician) and Advanced Practice Providers (APPs -  Physician Assistants and Nurse Practitioners) who all work together to provide you with the care you need, when you need it.  We recommend signing up for the patient portal called "MyChart".  Sign up information is provided on this After Visit Summary.  MyChart is used to connect with patients for Virtual Visits (Telemedicine).  Patients are able to view lab/test results, encounter notes, upcoming appointments, etc.  Non-urgent messages can be sent to your provider as well.   To learn more about what you can do with MyChart, go to  [email or web address].    Your next appointment:   3 month(s) After Doppler   The format for your next appointment:   In Person  Provider:   Quay Burow, MD  or Sande Rives, PA-C        Other Instructions NONE ordered at this time of appointment

## 2021-04-01 ENCOUNTER — Encounter: Payer: Self-pay | Admitting: Internal Medicine

## 2021-04-01 ENCOUNTER — Ambulatory Visit (INDEPENDENT_AMBULATORY_CARE_PROVIDER_SITE_OTHER): Payer: Medicare Other | Admitting: Internal Medicine

## 2021-04-01 VITALS — BP 144/88 | HR 90 | Temp 98.0°F | Resp 18 | Ht 65.0 in | Wt 104.0 lb

## 2021-04-01 DIAGNOSIS — F419 Anxiety disorder, unspecified: Secondary | ICD-10-CM

## 2021-04-01 DIAGNOSIS — F32A Depression, unspecified: Secondary | ICD-10-CM | POA: Diagnosis not present

## 2021-04-01 DIAGNOSIS — R269 Unspecified abnormalities of gait and mobility: Secondary | ICD-10-CM | POA: Diagnosis not present

## 2021-04-01 DIAGNOSIS — I10 Essential (primary) hypertension: Secondary | ICD-10-CM | POA: Diagnosis not present

## 2021-04-01 DIAGNOSIS — M25512 Pain in left shoulder: Secondary | ICD-10-CM

## 2021-04-01 DIAGNOSIS — R627 Adult failure to thrive: Secondary | ICD-10-CM

## 2021-04-01 MED ORDER — ALPRAZOLAM 0.5 MG PO TABS: ORAL_TABLET | ORAL | 0 refills | Status: AC

## 2021-04-01 MED ORDER — LISINOPRIL 5 MG PO TABS: 5.0000 mg | ORAL_TABLET | Freq: Two times a day (BID) | ORAL | Status: AC

## 2021-04-01 NOTE — Progress Notes (Addendum)
Subjective:    Patient ID: Crystal Osborne, female    DOB: [birth year], 88 y.o.   MRN: [id number]  DOS:  04/01/2021 Type of visit - description: f/u  Here with her son. Today we talk about left shoulder pain, depression. Also Insomnia and  blood pressure. Extended listening therapy provided  BP Readings from Last 3 Encounters:  04/01/21 (!) 144/88  03/11/21 124/72  01/30/21 (!) 189/107     Review of Systems See above   Past Medical History:  Diagnosis Date   AAA (abdominal aortic aneurysm)    sees Dr Eden Lathe    B12 deficiency    Cancer of right breast [facility]) 1960   Carotid artery disease 11/28/2008   sees Dr Oneida Alar    COPD (chronic obstructive pulmonary disease) ([place]) 01/25/2014   CVA (cerebrovascular accident) ([place]) 2006   Diabetes mellitus ([place]) 05/29/2010   A1c 6.0 on October 2011    Frequent PVCs    Hyperlipidemia    Hypertension    Osteoporosis 2003   PAD (peripheral artery disease) (HCC)    Palpitations    frequent PVCs (15% burden) noted on monitor in 09/2020   Pneumonia ~ 2014; ~ 2015   Prolapse of female bladder, acquired    Shingles    repeated episone 9/11 went to a UC   TIA (transient ischemic attack) ~ 2005   Transient hypothyroidism    d/t thyroidectomy   UTI (lower urinary tract infection) 06/2011    Past Surgical History:  Procedure Laterality Date   ABDOMINAL HYSTERECTOMY     no oophorectomy   CATARACT EXTRACTION W/ INTRAOCULAR LENS  IMPLANT, BILATERAL Bilateral    DILATION AND CURETTAGE OF UTERUS     MASTECTOMY, RADICAL Right 1960s   remotely    PERIPHERAL VASCULAR CATHETERIZATION  12/03/2015   PERIPHERAL VASCULAR CATHETERIZATION N/A 12/03/2015   Procedure: Abdominal Aortogram;  Surgeon: Lorretta Harp, MD;  Location: [place] CV LAB;  Service: Cardiovascular;  Laterality: N/A;   PERIPHERAL VASCULAR CATHETERIZATION Bilateral 12/03/2015   Procedure: Lower Extremity Angiography;  Surgeon: Lorretta Harp, MD;  Location: [place] CV  LAB;  Service: Cardiovascular;  Laterality: Bilateral;   PERIPHERAL VASCULAR CATHETERIZATION Bilateral 12/03/2015   Procedure: Peripheral Vascular Intervention;  Surgeon: Lorretta Harp, MD;  Location: [place] CV LAB;  Service: Cardiovascular;  Laterality: Bilateral;  42mmx58mm Lifestream bilateral Common Iliac Arteries   PERIPHERAL VASCULAR CATHETERIZATION Bilateral 12/03/2015   Procedure: Peripheral Vascular Atherectomy;  Surgeon: Lorretta Harp, MD;  Location: [place] CV LAB;  Service: Cardiovascular;  Laterality: Bilateral;  Common Iliac Arteries   SHOULDER CLOSED REDUCTION Left 11/08/2020   Procedure: CLOSED MANIPULATION SHOULDER;  Surgeon: Tania Ade, MD;  Location: [facility];  Service: Orthopedics;  Laterality: Left;   THYROIDECTOMY      Current Outpatient Medications  Medication Instructions   acetaminophen (TYLENOL) 1,000 mg, Oral, Every 6 hours PRN   albuterol (VENTOLIN HFA) 108 (90 Base) MCG/ACT inhaler INHALE 2 PUFFS INTO THE LUNGS EVERY 6 HOURS AS NEEDED FOR WHEEZING OR SHORTNESS OF BREATH   apixaban (ELIQUIS) 5 mg, Oral, 2 times daily   atorvastatin (LIPITOR) 40 MG tablet TAKE 1 TABLET(40 MG) BY MOUTH DAILY   Cholecalciferol (VITAMIN D3 PO) 1 tablet, Oral, Daily   furosemide (LASIX) 20 mg, Oral, As needed   hydroxypropyl methylcellulose / hypromellose (ISOPTO TEARS / GONIOVISC) 2.5 % ophthalmic solution 1 drop, Both Eyes, 2 times daily PRN   lisinopril (ZESTRIL) 5 mg, Oral, Daily   metoprolol  succinate (TOPROL XL) 12.5 mg, Oral, Daily   vitamin B-12 (CYANOCOBALAMIN) 1,000 mcg, Oral, Daily       Objective:   Physical Exam BP (!) 144/88 (BP Location: Left Leg, Patient Position: Sitting, Cuff Size: Small)    Pulse 90    Temp 98 F (36.7 C) (Oral)    Resp 18    Ht 5\' 5"  (1.651 m)    Wt 104 lb (47.2 kg)    SpO2 95%    BMI 17.31 kg/m  General:   Well developed, in no distress, frail and underweight appearing. HEENT:  Normocephalic . Face symmetric,  atraumatic Lungs:  CTA B Normal respiratory effort, no intercostal retractions, no accessory muscle use. Heart: RRR,  no murmur. MSK: L shoulder range of motion minimal Lower extremities: no pretibial edema bilaterally  Skin: Not pale. Not jaundice Neurologic:  alert & oriented X3.  Speech normal, gait not tested Psych--  Cognition and judgment appear intact.  Cooperative with normal attention span and concentration.  Behavior appropriate. No anxious or depressed appearing.      Assessment    Assessment  Prediabetes HTN Hyperlipidemia Insomnia  COPD: smoker, XR suggestive of dx, no PFTs Osteoporosis 2003-- refused dexas consistently  B12 def (185 05-2013) CV: ---CVA 2006 ---Carotid artery dz--last [place] 09-2011 40-59% B- ICA, declined further testing ---AAA --last [place]  05-2015, 1 year -- PVD, + ABIs 10-2015, 11-2015: B iliac stents -- Cardiomyopathy (apical-hypertrophic) echo 09-2020 Shingles 10-2009 and ~ 07-2015 (seen elsewhere, L face) Status post thyroidectomy, transient hypothyroidism Breast cancer 1960 Allergies- hives sometimes , saw derm    PLAN L shoulder pain: Previously evaluated by Cassie Freer, they said her options are    a local injection or shoulder replacement.  She is not inclined to do any of those. This pain is the more pressing issue she has, we have a very extended conversation about treatment: -Referred to another specialist? -Painkillers ?  on chart review she has issues with codeine and she ended up in the hospital after tramadol thus I am very reluctant to prescribe them - Avoid NSAIDs due to age and risk of kidney damage. Eventually we agreed on continue Tylenol during the daytime and takes Xanax at nighttime as patient said that she is unable to sleep due to pain and  if she could just sleep she will be okay. Xanax prescribed, will let me know in a couple of weeks if that works, consider add  gabapentin or Cymbalta. Depression: Related to being  unable to sleep, see above. HTN: Ambulatory BPs usually high in the 150/100, recommend to increase lisinopril 5 mg to BID.  BMP in 2 weeks. Social: Now lives with her son and daughter-in-law. Home PT ended few weeks ago, again we had a long conversation about it, she elected to be referred again noting that her benefits may have ended. Cardiomyopathy (apical-hypertrophic), carotid disease, small AAA Saw cardiology 03/11/2021, felt to be euvolemic, on Lasix as needed, continue with Eliquis, no aspirin. Left leg DVT: Per [place] 01/2021, anticoagulated, cardiology planning to follow-up. RTC labs only in 2 weeks BMP RTC to see me in 6 weeks  Time spent with the patient and her son 45 minutes   This visit occurred during the SARS-CoV-2 public health emergency.  Safety protocols were in place, including screening questions prior to the visit, additional usage of staff PPE, and extensive cleaning of exam room while observing appropriate contact time as indicated for disinfecting solutions.

## 2021-04-01 NOTE — Patient Instructions (Addendum)
Increase lisinopril 5 mg to 1 tablet twice daily  start taking alprazolam 0.5 mg: Either 1 or 1.5 tablets at bedtime to help you sleep  Check the  blood pressure regularly BP GOAL is between 110/65 and  135/85. If it is consistently higher or lower, let me know   GO TO THE FRONT DESK, [place] back for   a checkup in 6 weeks.  Come back for blood work only in 2 weeks

## 2021-04-02 DIAGNOSIS — S43085D Other dislocation of left shoulder joint, subsequent encounter: Secondary | ICD-10-CM | POA: Diagnosis not present

## 2021-04-02 DIAGNOSIS — S4292XD Fracture of left shoulder girdle, part unspecified, subsequent encounter for fracture with routine healing: Secondary | ICD-10-CM | POA: Diagnosis not present

## 2021-04-02 DIAGNOSIS — M6281 Muscle weakness (generalized): Secondary | ICD-10-CM | POA: Diagnosis not present

## 2021-04-02 DIAGNOSIS — R2681 Unsteadiness on feet: Secondary | ICD-10-CM | POA: Diagnosis not present

## 2021-04-02 DIAGNOSIS — Z9181 History of falling: Secondary | ICD-10-CM | POA: Diagnosis not present

## 2021-04-02 NOTE — Assessment & Plan Note (Signed)
L shoulder pain: Previously evaluated by Cassie Freer, they said her options are    a local injection or shoulder replacement.  She is not inclined to do any of those. This pain is the more pressing issue she has, we have a very extended conversation about treatment: -Referred to another specialist? -Painkillers ?  on chart review she has issues with codeine and she ended up in the hospital after tramadol thus I am very reluctant to prescribe them - Avoid NSAIDs due to age and risk of kidney damage. Eventually we agreed on continue Tylenol during the daytime and takes Xanax at nighttime as patient said that she is unable to sleep due to pain and  if she could just sleep she will be okay. Xanax prescribed, will let me know in a couple of weeks if that works, consider add  gabapentin or Cymbalta. Depression: Related to being unable to sleep, see above. HTN: Ambulatory BPs usually high in the 150/100, recommend to increase lisinopril 5 mg to BID.  BMP in 2 weeks. Social: Now lives with her son and daughter-in-law. Home PT ended few weeks ago, again we had a long conversation about it, she elected to be referred again noting that her benefits may have ended. Cardiomyopathy (apical-hypertrophic), carotid disease, small AAA Saw cardiology 03/11/2021, felt to be euvolemic, on Lasix as needed, continue with Eliquis, no aspirin. Left leg DVT: Per [place] 01/2021, anticoagulated, cardiology planning to follow-up. RTC labs only in 2 weeks BMP RTC to see me in 6 weeks

## 2021-04-16 ENCOUNTER — Telehealth: Payer: Self-pay | Admitting: Cardiovascular Disease

## 2021-04-16 NOTE — Telephone Encounter (Signed)
Returned call to son-[name]. He states that pt has appt scheduled for next Friday 04-26-21 and he states that his work just told him he has to go to [place] next Wednesday,Thursday and 1/2 a day on Friday and pt's appt is in the am. He states that he should be back at 1-2pm on Friday. Son states that this is a really important appointment and pt needs to come to an appointment soon. Can we change appt to the afternoon? Please advise. ?

## 2021-04-16 NOTE — Telephone Encounter (Signed)
Patient's son called and would like to talk with Dr. Gwenlyn Found or nurse. Says that it is important ?

## 2021-04-16 NOTE — Telephone Encounter (Signed)
Spoke with pt's son [name] regarding appointment time on Friday, March 17. Able to change time of appointment due to scheduling conflict. Crystal Osborne verbalizes understanding.  ?

## 2021-04-21 ENCOUNTER — Other Ambulatory Visit: Payer: Self-pay

## 2021-04-21 ENCOUNTER — Emergency Department (HOSPITAL_COMMUNITY): Payer: Medicare Other

## 2021-04-21 ENCOUNTER — Observation Stay (HOSPITAL_COMMUNITY)
Admission: EM | Admit: 2021-04-21 | Discharge: 2021-04-22 | Disposition: A | Discharge status: Home / Self Care | Payer: Medicare Other | Attending: Internal Medicine | Admitting: Internal Medicine

## 2021-04-21 DIAGNOSIS — J439 Emphysema, unspecified: Secondary | ICD-10-CM | POA: Diagnosis not present

## 2021-04-21 DIAGNOSIS — J9811 Atelectasis: Secondary | ICD-10-CM | POA: Diagnosis not present

## 2021-04-21 DIAGNOSIS — J189 Pneumonia, unspecified organism: Secondary | ICD-10-CM | POA: Diagnosis not present

## 2021-04-21 DIAGNOSIS — J449 Chronic obstructive pulmonary disease, unspecified: Secondary | ICD-10-CM | POA: Diagnosis not present

## 2021-04-21 DIAGNOSIS — Z7901 Long term (current) use of anticoagulants: Secondary | ICD-10-CM | POA: Insufficient documentation

## 2021-04-21 DIAGNOSIS — J90 Pleural effusion, not elsewhere classified: Secondary | ICD-10-CM | POA: Diagnosis not present

## 2021-04-21 DIAGNOSIS — Z853 Personal history of malignant neoplasm of breast: Secondary | ICD-10-CM | POA: Diagnosis not present

## 2021-04-21 DIAGNOSIS — Z86718 Personal history of other venous thrombosis and embolism: Secondary | ICD-10-CM | POA: Diagnosis not present

## 2021-04-21 DIAGNOSIS — I422 Other hypertrophic cardiomyopathy: Secondary | ICD-10-CM | POA: Diagnosis not present

## 2021-04-21 DIAGNOSIS — I248 Other forms of acute ischemic heart disease: Secondary | ICD-10-CM

## 2021-04-21 DIAGNOSIS — F17210 Nicotine dependence, cigarettes, uncomplicated: Secondary | ICD-10-CM | POA: Diagnosis not present

## 2021-04-21 DIAGNOSIS — I10 Essential (primary) hypertension: Secondary | ICD-10-CM | POA: Diagnosis not present

## 2021-04-21 DIAGNOSIS — Z7984 Long term (current) use of oral hypoglycemic drugs: Secondary | ICD-10-CM | POA: Diagnosis not present

## 2021-04-21 DIAGNOSIS — L89302 Pressure ulcer of unspecified buttock, stage 2: Secondary | ICD-10-CM | POA: Diagnosis not present

## 2021-04-21 DIAGNOSIS — E119 Type 2 diabetes mellitus without complications: Secondary | ICD-10-CM | POA: Diagnosis not present

## 2021-04-21 DIAGNOSIS — F0390 Unspecified dementia without behavioral disturbance: Secondary | ICD-10-CM | POA: Diagnosis not present

## 2021-04-21 DIAGNOSIS — Z20822 Contact with and (suspected) exposure to covid-19: Secondary | ICD-10-CM | POA: Insufficient documentation

## 2021-04-21 DIAGNOSIS — R911 Solitary pulmonary nodule: Secondary | ICD-10-CM

## 2021-04-21 DIAGNOSIS — I2510 Atherosclerotic heart disease of native coronary artery without angina pectoris: Secondary | ICD-10-CM | POA: Diagnosis not present

## 2021-04-21 DIAGNOSIS — I779 Disorder of arteries and arterioles, unspecified: Secondary | ICD-10-CM | POA: Diagnosis present

## 2021-04-21 DIAGNOSIS — I2489 Other forms of acute ischemic heart disease: Secondary | ICD-10-CM

## 2021-04-21 DIAGNOSIS — R0602 Shortness of breath: Secondary | ICD-10-CM | POA: Diagnosis not present

## 2021-04-21 DIAGNOSIS — I498 Other specified cardiac arrhythmias: Secondary | ICD-10-CM | POA: Diagnosis not present

## 2021-04-21 DIAGNOSIS — N390 Urinary tract infection, site not specified: Secondary | ICD-10-CM

## 2021-04-21 DIAGNOSIS — R062 Wheezing: Secondary | ICD-10-CM | POA: Diagnosis not present

## 2021-04-21 DIAGNOSIS — L8990 Pressure ulcer of unspecified site, unspecified stage: Secondary | ICD-10-CM | POA: Insufficient documentation

## 2021-04-21 DIAGNOSIS — I82403 Acute embolism and thrombosis of unspecified deep veins of lower extremity, bilateral: Secondary | ICD-10-CM | POA: Diagnosis not present

## 2021-04-21 LAB — COMPREHENSIVE METABOLIC PANEL
ALT: 22 U/L (ref 0–44)
AST: 31 U/L (ref 15–41)
Albumin: 3 g/dL — ABNORMAL LOW (ref 3.5–5.0)
Alkaline Phosphatase: 86 U/L (ref 38–126)
Anion gap: 12 (ref 5–15)
BUN: 25 mg/dL — ABNORMAL HIGH (ref 8–23)
CO2: 23 mmol/L (ref 22–32)
Calcium: 8.8 mg/dL — ABNORMAL LOW (ref 8.9–10.3)
Chloride: 98 mmol/L (ref 98–111)
Creatinine, Ser: 0.96 mg/dL (ref 0.44–1.00)
GFR, Estimated: 57 mL/min — ABNORMAL LOW (ref >60)
Glucose, Bld: 151 mg/dL — ABNORMAL HIGH (ref 70–99)
Potassium: 4.1 mmol/L (ref 3.5–5.1)
Sodium: 133 mmol/L — ABNORMAL LOW (ref 135–145)
Total Bilirubin: 0.7 mg/dL (ref 0.3–1.2)
Total Protein: 6.3 g/dL — ABNORMAL LOW (ref 6.5–8.1)

## 2021-04-21 LAB — URINALYSIS, ROUTINE W REFLEX MICROSCOPIC
Bacteria, UA: NONE SEEN
Bilirubin Urine: NEGATIVE
Glucose, UA: NEGATIVE mg/dL
Ketones, ur: NEGATIVE mg/dL
Nitrite: NEGATIVE
Protein, ur: 30 mg/dL — AB
Specific Gravity, Urine: 1.013 (ref 1.005–1.030)
pH: 6 (ref 5.0–8.0)

## 2021-04-21 LAB — CBC WITH DIFFERENTIAL/PLATELET
Abs Immature Granulocytes: 0.04 10*3/uL (ref 0.00–0.07)
Basophils Absolute: 0 10*3/uL (ref 0.0–0.1)
Basophils Relative: 0 %
Eosinophils Absolute: 0.2 10*3/uL (ref 0.0–0.5)
Eosinophils Relative: 2 %
HCT: 40.9 % (ref 36.0–46.0)
Hemoglobin: 13.6 g/dL (ref 12.0–15.0)
Immature Granulocytes: 0 %
Lymphocytes Relative: 6 %
Lymphs Abs: 0.7 10*3/uL (ref 0.7–4.0)
MCH: 31.6 pg (ref 26.0–34.0)
MCHC: 33.3 g/dL (ref 30.0–36.0)
MCV: 94.9 fL (ref 80.0–100.0)
Monocytes Absolute: 0.5 10*3/uL (ref 0.1–1.0)
Monocytes Relative: 4 %
Neutro Abs: 9.3 10*3/uL — ABNORMAL HIGH (ref 1.7–7.7)
Neutrophils Relative %: 88 %
Platelets: 192 10*3/uL (ref 150–400)
RBC: 4.31 MIL/uL (ref 3.87–5.11)
RDW: 15.4 % (ref 11.5–15.5)
WBC: 10.7 10*3/uL — ABNORMAL HIGH (ref 4.0–10.5)
nRBC: 0 % (ref 0.0–0.2)

## 2021-04-21 LAB — RESP PANEL BY RT-PCR (FLU A&B, COVID) ARPGX2
Influenza A by PCR: NEGATIVE
Influenza B by PCR: NEGATIVE
SARS Coronavirus 2 by RT PCR: NEGATIVE

## 2021-04-21 LAB — TROPONIN I (HIGH SENSITIVITY)
Troponin I (High Sensitivity): 43 ng/L — ABNORMAL HIGH (ref <18)
Troponin I (High Sensitivity): 37 ng/L — ABNORMAL HIGH (ref <18)

## 2021-04-21 MED ORDER — SODIUM CHLORIDE 0.9 % IV SOLN
500.0000 mg | INTRAVENOUS | Status: DC
  Administered 2021-04-21: 500 mg via INTRAVENOUS
  Filled 2021-04-21 (×2): qty 5

## 2021-04-21 MED ORDER — SODIUM CHLORIDE 0.9 % IV SOLN
2.0000 g | INTRAVENOUS | Status: DC
  Administered 2021-04-21: 2 g via INTRAVENOUS
  Filled 2021-04-21: qty 20

## 2021-04-21 MED ORDER — IOHEXOL 350 MG/ML SOLN
50.0000 mL | Freq: Once | INTRAVENOUS | Status: AC | PRN
  Administered 2021-04-21: 50 mL via INTRAVENOUS

## 2021-04-21 NOTE — H&P (Addendum)
History and Physical    Patient: Crystal Osborne DOB: 11-17-1931 DOA: 04/21/2021 DOS: the patient was seen and examined on 04/22/2021 PCP: Colon Branch, MD  Patient coming from: Home  Chief Complaint:  Chief Complaint  Patient presents with   Shortness of Breath   HPI: Crystal Osborne is a 88 y.o. female with medical history significant of hypertrophic cardiomyopathy, CVA, AAA, hypertension, CAD, COPD, dementia, history of breast cancer, DVT on Eliquis who presents with concerns of hypoxia.  Patient reports that for the past several days she has had ongoing shortness of breath both at rest and with exertion.  Sometimes she would wake up with shortness of breath.  Has nonproductive cough.  She also has been noting nausea and diarrhea but no vomiting.  States she was started on antibiotic yesterday for presumptive "kidney infection" but she denies any dysuria, increased urgency or frequency.  In the ED, she was afebrile mildly hypertensive and stable on room air.  Mild leukocytosis of 10.7.  CMP unremarkable.  Troponin elevated at 43 but down trended to 37 on repeat.EKG shows sinus rhythm with ventricular bigeminy and LVH.  CT chest with no pulmonary embolism.  Bilateral pleural effusion.  Right middle lobe opacity favoring infection.  There is also incidental findings of multiple bilateral pulmonary nodule and nodularity in the left breast.  Cannot exclude pulmonary metastasis and possible left breast malignancy.   Patient has not had a mammogram for some time.  Has not been following with oncology since her oncologist passed away a few years ago.  She continues to smoke about 4 to 5 cigarettes/day.  Patient feels that at this point in her life she would rather not know whether she had cancer since she does not want to undergo any treatment.  Review of Systems: As mentioned in the history of present illness. All other systems reviewed and are negative. Past Medical History:   Diagnosis Date   AAA (abdominal aortic aneurysm)    sees Dr Eden Lathe    B12 deficiency    Cancer of right breast [facility]) 1960   Carotid artery disease 11/28/2008   sees Dr Oneida Alar    COPD (chronic obstructive pulmonary disease) ([place]) 01/25/2014   CVA (cerebrovascular accident) ([place]) 2006   Diabetes mellitus ([place]) 05/29/2010   A1c 6.0 on October 2011    Frequent PVCs    Hyperlipidemia    Hypertension    Osteoporosis 2003   PAD (peripheral artery disease) (HCC)    Palpitations    frequent PVCs (15% burden) noted on monitor in 09/2020   Pneumonia ~ 2014; ~ 2015   Prolapse of female bladder, acquired    Shingles    repeated episone 9/11 went to a UC   TIA (transient ischemic attack) ~ 2005   Transient hypothyroidism    d/t thyroidectomy   UTI (lower urinary tract infection) 06/2011   Past Surgical History:  Procedure Laterality Date   ABDOMINAL HYSTERECTOMY     no oophorectomy   CATARACT EXTRACTION W/ INTRAOCULAR LENS  IMPLANT, BILATERAL Bilateral    DILATION AND CURETTAGE OF UTERUS     MASTECTOMY, RADICAL Right 1960s   remotely    PERIPHERAL VASCULAR CATHETERIZATION  12/03/2015   PERIPHERAL VASCULAR CATHETERIZATION N/A 12/03/2015   Procedure: Abdominal Aortogram;  Surgeon: Lorretta Harp, MD;  Location: [place] [facility] LAB;  Service: Cardiovascular;  Laterality: N/A;   PERIPHERAL VASCULAR CATHETERIZATION Bilateral 12/03/2015   Procedure: Lower Extremity Angiography;  Surgeon: Lorretta Harp, MD;  Location: [place] [facility];  Service: Cardiovascular;  Laterality: Bilateral;   PERIPHERAL VASCULAR CATHETERIZATION Bilateral 12/03/2015   Procedure: Peripheral Vascular Intervention;  Surgeon: Lorretta Harp, MD;  Location: [place] [facility] LAB;  Service: Cardiovascular;  Laterality: Bilateral;  38mx58mm Lifestream bilateral Common Iliac Arteries   PERIPHERAL VASCULAR CATHETERIZATION Bilateral 12/03/2015   Procedure: Peripheral Vascular Atherectomy;  Surgeon: JLorretta Harp MD;   [place] [place] [facility];  Service: Cardiovascular;  Laterality: Bilateral;  Common Iliac Arteries   SHOULDER CLOSED REDUCTION Left 11/08/2020   Procedure: CLOSED MANIPULATION SHOULDER;  Surgeon: CTania Ade MD;  [facility]: [facility];  Service: Orthopedics;  Laterality: Left;   THYROIDECTOMY     Social History:  reports that she has been smoking cigarettes. She has a 33.50 pack-year smoking history. She has never used smokeless tobacco. She reports that she does not drink alcohol and does not use drugs.  Allergies  Allergen Reactions   Septra [Sulfamethoxazole-Trimethoprim] Itching    Pt states she had a severe allergic reaction; she was in the ICU for 2 days because of taking this.   Tramadol Other (See Comments)    Was given during surgery? CANNOT TAKE!! The patient felt like she was "spaced out"   Ciprofloxacin Hcl Itching   Codeine Rash and Other (See Comments)    Made the patient feel "very strange"    Family History  Problem Relation Age of Onset   Cancer Other        sister (lung) , brother (mouth)   Colon cancer Neg Hx    Coronary artery disease Neg Hx    Diabetes type II Neg Hx    Breast cancer Neg Hx     Prior to Admission medications   Medication Sig Start Date End Date Taking? Authorizing Provider  acetaminophen (TYLENOL) 500 MG tablet Take 2 tablets (1,000 mg total) by mouth every 6 (six) hours as needed (for pain). 11/13/20  Yes GPatrecia Pour MD  albuterol (VENTOLIN HFA) 108 (90 Base) MCG/ACT inhaler INHALE 2 PUFFS INTO THE LUNGS EVERY 6 HOURS AS NEEDED FOR WHEEZING OR SHORTNESS OF BREATH Patient taking differently: Inhale 2 puffs into the lungs every 6 (six) hours as needed for wheezing or shortness of breath. 12/03/20  Yes Paz, JAlda Berthold MD  ALPRAZolam (Duanne Moron 0.5 MG tablet 1 or 1.5 tab at bed time prn insomnia Patient taking differently: Take 0.5-0.75 mg by mouth daily as needed for anxiety or sleep. 04/01/21  Yes Paz, JAlda Berthold MD  apixaban (ELIQUIS) 5 MG TABS  tablet Take 1 tablet (5 mg total) by mouth 2 (two) times daily. 01/30/21  Yes SDonato Heinz MD  atorvastatin (LIPITOR) 40 MG tablet TAKE 1 TABLET(40 MG) BY MOUTH DAILY Patient taking differently: Take 40 mg by mouth at bedtime. 11/03/20  Yes Medina-Vargas, Monina C, NP  Cholecalciferol (VITAMIN D3 PO) Take 1 capsule by mouth daily.   Yes [provider]  furosemide (LASIX) 20 MG tablet Take 1 tablet (20 mg total) by mouth as needed for edema. Patient taking differently: Take 20 mg by mouth daily as needed for edema. 01/21/21  Yes GSande RivesE, PA-C  hydroxypropyl methylcellulose / hypromellose (ISOPTO TEARS / GONIOVISC) 2.5 % ophthalmic solution Place 1 drop into both eyes 2 (two) times daily as needed for dry eyes.   Yes [provider]  lisinopril (ZESTRIL) 5 MG tablet Take 1 tablet (5 mg total) by mouth in the morning and at bedtime. 04/01/21  Yes Paz, [place]  E, MD  metoprolol succinate (TOPROL XL) 25 MG 24 hr tablet Take 0.5 tablets (12.5 mg total) by mouth daily. Patient taking differently: Take 12.5 mg by mouth in the morning. 11/03/20  Yes Medina-Vargas, Monina C, NP  nitrofurantoin, macrocrystal-monohydrate, (MACROBID) 100 MG capsule Take 100 mg by mouth 2 (two) times daily. 04/19/21 04/27/21 Yes [provider]  vitamin B-12 (CYANOCOBALAMIN) 1000 MCG tablet Take 1,000 mcg by mouth daily.   Yes [provider]    Physical Exam: Vitals:   04/21/21 2336 04/22/21 0049 04/22/21 0117 04/22/21 0134  BP: (!) 147/62   (!) 160/68  Pulse: 74   61  Resp: 16   (!) 25  Temp:  97.7 F (36.5 C)  98 F (36.7 C)  TempSrc:  Oral  Oral  SpO2: 93%   91%  Weight:   43.6 kg   Height:   '5\' 5"'$  (1.651 m)    Constitutional: NAD, calm, comfortable, thin frail chronically ill-appearing female lying flat in bed Eyes: lids and conjunctivae normal ENMT: Mucous membranes are moist.  Neck: normal, supple Respiratory: clear to auscultation bilaterally, no wheezing,  no crackles. Normal respiratory effort. No accessory muscle use.  Cardiovascular: Regular rate and rhythm, no murmurs / rubs / gallops. No extremity edema.  Abdomen: soft, non-distended, no tenderness, no masses palpated. Bowel sounds positive.  Musculoskeletal: no clubbing / cyanosis. No joint deformity upper and lower extremities. Good ROM, no contractures. Normal muscle tone.  Skin: no rashes, lesions, ulcers. No induration Neurologic: CN 2-12 grossly intact.  Strength 5/5 in all 4.  Psychiatric: Normal judgment and insight. Alert and oriented x 3. Normal mood. Data Reviewed:  See HPI Pressure Injury 04/22/21 Buttocks Mid;Medial Stage 2 -  Partial thickness loss of dermis presenting as a shallow open injury with a red, pink wound bed without slough. (Active)  04/22/21 0130  Location: Buttocks  Location Orientation: Mid;Medial  Staging: Stage 2 -  Partial thickness loss of dermis presenting as a shallow open injury with a red, pink wound bed without slough.  Wound Description (Comments):   Present on Admission: Yes        Assessment and Plan: * Community acquired pneumonia Right middle lobe pneumonia -continue IV Rocephin and Azithromycin -incentive spirometry while awake   Coronary artery disease -continue statin and beta-blocker  History of DVT (deep vein thrombosis) Patient had left lower extremity DVT found on 01/2021.  Continue Eliquis daily.  UTI (urinary tract infection) -Questionable UTI.  Patient recently diagnosed with UTI outpatient and started on Macrobid.  However she denies any only notes symptoms of nausea and diarrhea.  Repeat UA.  Already on IV antibiotics for community-acquired pneumonia.  Pressure injury of skin Stage II pressure ulcer noted by RN.  Wound care daily.  Pulmonary nodule - Multiple pulmonary nodules noted on CT chest with nodularity seen on left breast.  Patient would like to forego any further malignancy work-up as stated  above  Hypertrophic cardiomyopathy ([place]) -stable. Continue beta blocker  PERSONAL HX BREAST CANCER -remote hx with right mastectomy.  Unfortunately, CT chest today showing nodularity to left breast.  Patient has not had a recent mammogram.  However she expressed that at this point in her life, she would rather not find out whether or not she has cancer since she does not want to undergo any further treatment.     Advance Care Planning:   Code Status: DNR   Consults: none  Family Communication: No family at bedside  Severity of  Illness: The appropriate patient status for this patient is OBSERVATION. Observation status is judged to be reasonable and necessary in order to provide the required intensity of service to ensure the patient's safety. The patient's presenting symptoms, physical exam findings, and initial radiographic and laboratory data in the context of their medical condition is felt to place them at decreased risk for further clinical deterioration. Furthermore, it is anticipated that the patient will be medically stable for discharge from the hospital within 2 midnights of admission.   Author: Orene Desanctis, DO 04/22/2021 3:52 AM  For on call review [email or web address].

## 2021-04-21 NOTE — ED Triage Notes (Signed)
Pt sent here by UC for PE rule out, pt has had shob and productive cough x1 day. Pt has hx od DVT, xray was done at [facility] and shoulder L pleural effusion. Pt denies pain ?

## 2021-04-21 NOTE — ED Notes (Signed)
Pt back from ct, pt c/o shoulder pain, pt states that she can't have anything for her shoulder pain, family at bedside.  ?

## 2021-04-21 NOTE — ED Provider Notes (Signed)
[facility] EMERGENCY DEPARTMENT Provider Note   CSN: [id number] Arrival date & time: 04/21/21  1431     History  Chief Complaint  Patient presents with   Shortness of Breath    Crystal Osborne is a 88 y.o. female.   Shortness of Breath Patient presents feeling bad.  Sent from urgent care.  Has had shortness of breath and a cough.  Does have some history of COPD.  Had sats that were low.  But reviewing the note it appears that it was 93%.  Sent in to rule out pulmonary embolism.  Reportedly has had a little bit of a cough.  X-ray done there according to report showed pleural effusion.  Is on Eliquis for DVT.  No swelling her legs.  Has been treated for UTI and just got Macrobid.  Patient feels a little bad all over.    Home Medications Prior to Admission medications   Medication Sig Start Date End Date Taking? Authorizing Provider  acetaminophen (TYLENOL) 500 MG tablet Take 2 tablets (1,000 mg total) by mouth every 6 (six) hours as needed (for pain). 11/13/20  Yes Patrecia Pour, MD  albuterol (VENTOLIN HFA) 108 (90 Base) MCG/ACT inhaler INHALE 2 PUFFS INTO THE LUNGS EVERY 6 HOURS AS NEEDED FOR WHEEZING OR SHORTNESS OF BREATH 12/03/20  Yes Paz, Alda Berthold, MD  ALPRAZolam Duanne Moron) 0.5 MG tablet 1 or 1.5 tab at bed time prn insomnia Patient taking differently: 0.5 mg daily as needed for anxiety or sleep. 1 or 1.5 tab at bed time prn insomnia 04/01/21  Yes Paz, Alda Berthold, MD  apixaban (ELIQUIS) 5 MG TABS tablet Take 1 tablet (5 mg total) by mouth 2 (two) times daily. 01/30/21  Yes Donato Heinz, MD  atorvastatin (LIPITOR) 40 MG tablet TAKE 1 TABLET(40 MG) BY MOUTH DAILY Patient taking differently: Take 40 mg by mouth at bedtime. 11/03/20  Yes Medina-Vargas, Monina C, NP  Cholecalciferol (VITAMIN D3 PO) Take 1 capsule by mouth daily.   Yes [provider]  furosemide (LASIX) 20 MG tablet Take 1 tablet (20 mg total) by mouth as needed for edema. 01/21/21  Yes  Sande Rives E, PA-C  hydroxypropyl methylcellulose / hypromellose (ISOPTO TEARS / GONIOVISC) 2.5 % ophthalmic solution Place 1 drop into both eyes 2 (two) times daily as needed for dry eyes.   Yes [provider]  lisinopril (ZESTRIL) 5 MG tablet Take 1 tablet (5 mg total) by mouth in the morning and at bedtime. 04/01/21  Yes Paz, Alda Berthold, MD  metoprolol succinate (TOPROL XL) 25 MG 24 hr tablet Take 0.5 tablets (12.5 mg total) by mouth daily. Patient taking differently: Take 12.5 mg by mouth in the morning. 11/03/20  Yes Medina-Vargas, Monina C, NP  nitrofurantoin, macrocrystal-monohydrate, (MACROBID) 100 MG capsule Take 100 mg by mouth 2 (two) times daily. 04/19/21 04/27/21 Yes [provider]  vitamin B-12 (CYANOCOBALAMIN) 1000 MCG tablet Take 1,000 mcg by mouth daily.   Yes [provider]      Allergies    Septra [sulfamethoxazole-trimethoprim], Tramadol, Ciprofloxacin hcl, and Codeine    Review of Systems   Review of Systems  Respiratory:  Positive for shortness of breath.    Physical Exam Updated Vital Signs BP (!) 158/73    Pulse 73    Temp 98.4 F (36.9 C) (Oral)    Resp (!) 24    SpO2 96%  Physical Exam Vitals and nursing note reviewed.  HENT:     Head:  Atraumatic.  Cardiovascular:     Rate and Rhythm: Regular rhythm.  Pulmonary:     Comments: Mildly harsh diffuse breath sounds.  Does have cough. Chest:     Chest wall: No tenderness.  Musculoskeletal:     Right lower leg: No tenderness.     Left lower leg: No tenderness.  Skin:    General: Skin is warm.     Capillary Refill: Capillary refill takes less than 2 seconds.  Neurological:     Mental Status: She is alert.    ED Results / Procedures / Treatments   Labs (all labs ordered are listed, but only abnormal results are displayed) Labs Reviewed  CBC WITH DIFFERENTIAL/PLATELET - Abnormal; Notable for the following components:      Result Value   WBC 10.7 (*)    Neutro Abs 9.3 (*)     All other components within normal limits  COMPREHENSIVE METABOLIC PANEL - Abnormal; Notable for the following components:   Sodium 133 (*)    Glucose, Bld 151 (*)    BUN 25 (*)    Calcium 8.8 (*)    Total Protein 6.3 (*)    Albumin 3.0 (*)    GFR, Estimated 57 (*)    All other components within normal limits  URINALYSIS, ROUTINE W REFLEX MICROSCOPIC - Abnormal; Notable for the following components:   APPearance HAZY (*)    Hgb urine dipstick SMALL (*)    Protein, ur 30 (*)    Leukocytes,Ua MODERATE (*)    All other components within normal limits  TROPONIN I (HIGH SENSITIVITY) - Abnormal; Notable for the following components:   Troponin I (High Sensitivity) 43 (*)    All other components within normal limits  TROPONIN I (HIGH SENSITIVITY)    EKG EKG Interpretation  Date/Time:  Sunday April 21 2021 15:09:45 EDT Ventricular Rate:  79 PR Interval:  131 QRS Duration: 101 QT Interval:  388 QTC Calculation: 371 R Axis:   75 Text Interpretation: Sinus rhythm Ventricular bigeminy Left atrial enlargement LVH with secondary repolarization abnormality Confirmed by Davonna Belling [phone]) on 04/21/2021 3:13:18 PM  Radiology CT Angio Chest PE W and/or Wo Contrast  Result Date: 04/21/2021 CLINICAL DATA:  Pulmonary embolism suspected. "High probability". Prior right mastectomy. * onc * EXAM: CT ANGIOGRAPHY CHEST WITH CONTRAST TECHNIQUE: Multidetector CT imaging of the chest was performed using the standard protocol during bolus administration of intravenous contrast. Multiplanar CT image reconstructions and MIPs were obtained to evaluate the vascular anatomy. RADIATION DOSE REDUCTION: This exam was performed according to the departmental dose-optimization program which includes automated exposure control, adjustment of the mA and/or kV according to patient size and/or use of iterative reconstruction technique. CONTRAST:  35m OMNIPAQUE IOHEXOL 350 MG/ML SOLN COMPARISON:  11/09/2020 chest  radiograph.  No prior chest CT. FINDINGS: Cardiovascular: The quality of this exam for evaluation of pulmonary embolism is good. The bolus is well timed. Mild limitations secondary to patient arm position, not raised above the head. No evidence of pulmonary embolism. Advanced aortic and branch vessel atherosclerosis. Moderate cardiomegaly with left ventricular hypertrophy. Three vessel coronary artery calcification. Mediastinum/Nodes: Precarinal node measures 1.3 cm on 59/5. Right hilar node measures 1.3 cm on 61/5. Lungs/Pleura: Small, right larger than left pleural effusions. Biapical pleuroparenchymal scarring. Centrilobular and paraseptal emphysema. Dependent bibasilar atelectasis. Minimal right apical ground-glass laterally on 29/6. Anteromedial right middle lobe opacities including on images 94 and 95 of series 6 are favored to represent foci of infection. Within both lungs there  are pulmonary nodules. These are identified on series 6. Example in the right middle lobe at 3 mm on 93/6, right lower lobe at 4 mm on 105/6, and left lower lobe at 5 mm on 116/6. Superior segment right lower lobe 6 mm nodule on 53/6. Mucoid impaction in the lingula. Upper Abdomen: Normal imaged portions of the liver, spleen, stomach, pancreas, adrenal glands, kidneys. Infrarenal abdominal aortic dilatation at 3.5 x 3.1 cm on 166/5, incompletely imaged. Musculoskeletal: Right mastectomy. Left breast nodularity laterally, including at 2.5 cm on 108/5. No acute osseous abnormality. Review of the MIP images confirms the above findings. IMPRESSION: 1.  No evidence of pulmonary embolism. 2. Bilateral pleural effusions. Anteromedial right middle lobe opacities are favored to represent areas of infection. Technically indeterminate. Consider antibiotic therapy and CT follow-up at 6 weeks. 3. Multiple bilateral pulmonary nodules. Prior right mastectomy with apparent nodularity in the left breast. Cannot exclude pulmonary metastasis and  possible left breast metachronous primary. Consider multidisciplinary thoracic oncology consultation as well as physical exam correlation. 4. Thoracic adenopathy which could be reactive or metastatic. 5. Aortic atherosclerosis (ICD10-I70.0), coronary artery atherosclerosis and emphysema (ICD10-J43.9). 6. Infrarenal abdominal aortic dilatation, incompletely imaged. Recommend follow-up ultrasound every 2 years. This recommendation follows ACR consensus guidelines: White Paper of the ACR Incidental Findings Committee II on Vascular Findings. J Am Coll Radiol 2013; 10:789-794. Electronically Signed   By: Abigail Miyamoto M.D.   On: 04/21/2021 16:58    Procedures Procedures    Medications Ordered in ED Medications  iohexol (OMNIPAQUE) 350 MG/ML injection 50 mL (50 mLs Intravenous Contrast Given 04/21/21 1635)    ED Course/ Medical Decision Making/ A&P                           Medical Decision Making Amount and/or Complexity of Data Reviewed Labs: ordered. Radiology: ordered.  Risk Prescription drug management.  Patient sent from urgent care.  Cough.  Weakness.  Reportedly was tachycardic.  Reportedly also hypoxic.  History of DVT.  Positive D-dimer as an outpatient.  CT angiography done and showed no PE but did show pneumonia.  Independently interpreted me.  Also having some lung nodules.  Will need to be followed.  However with age, comorbidities and prehospital hypoxia I feels the patient would benefit from admission to the hospital.  Has had a shot of Rocephin by urgent care.  Is on Macrobid for UTI although urine here does not show bacteria does show white cells.  Will discuss with hospitalist for admission I have reviewed the note brought from the patient from urgent care         Final Clinical Impression(s) / ED Diagnoses Final diagnoses:  Community acquired pneumonia, unspecified laterality    Rx / DC Orders ED Discharge Orders     None         Davonna Belling,  MD 04/21/21 1830

## 2021-04-21 NOTE — ED Notes (Signed)
Assisted pt to bedside commode.

## 2021-04-21 NOTE — ED Notes (Signed)
Pt in bed, pt c/o shoulder pain, pt states that she doesn't need anything at this time.  ?

## 2021-04-21 NOTE — ED Notes (Signed)
Pt asked for urine sample. Pt says they dont have urgency and will be asked again later.  ?

## 2021-04-21 NOTE — ED Notes (Signed)
Pt up to bedside commode with assistance, pt back into bed, depends changed.  ?

## 2021-04-22 ENCOUNTER — Encounter (HOSPITAL_COMMUNITY): Payer: Self-pay | Admitting: Family Medicine

## 2021-04-22 DIAGNOSIS — N390 Urinary tract infection, site not specified: Secondary | ICD-10-CM

## 2021-04-22 DIAGNOSIS — I2510 Atherosclerotic heart disease of native coronary artery without angina pectoris: Secondary | ICD-10-CM

## 2021-04-22 DIAGNOSIS — I248 Other forms of acute ischemic heart disease: Secondary | ICD-10-CM

## 2021-04-22 DIAGNOSIS — Z86718 Personal history of other venous thrombosis and embolism: Secondary | ICD-10-CM

## 2021-04-22 DIAGNOSIS — J189 Pneumonia, unspecified organism: Secondary | ICD-10-CM | POA: Diagnosis not present

## 2021-04-22 DIAGNOSIS — L8990 Pressure ulcer of unspecified site, unspecified stage: Secondary | ICD-10-CM | POA: Insufficient documentation

## 2021-04-22 LAB — CBC
HCT: 38.6 % (ref 36.0–46.0)
Hemoglobin: 12.8 g/dL (ref 12.0–15.0)
MCH: 30.8 pg (ref 26.0–34.0)
MCHC: 33.2 g/dL (ref 30.0–36.0)
MCV: 92.8 fL (ref 80.0–100.0)
Platelets: 165 10*3/uL (ref 150–400)
RBC: 4.16 MIL/uL (ref 3.87–5.11)
RDW: 15.4 % (ref 11.5–15.5)
WBC: 8.5 10*3/uL (ref 4.0–10.5)
nRBC: 0 % (ref 0.0–0.2)

## 2021-04-22 MED ORDER — METOPROLOL SUCCINATE ER 25 MG PO TB24
12.5000 mg | ORAL_TABLET | Freq: Every day | ORAL | Status: DC
  Administered 2021-04-22: 12.5 mg via ORAL
  Filled 2021-04-22: qty 1

## 2021-04-22 MED ORDER — ATORVASTATIN CALCIUM 40 MG PO TABS: 40.0000 mg | ORAL_TABLET | Freq: Every day | ORAL | Status: DC

## 2021-04-22 MED ORDER — VITAMIN B-12 1000 MCG PO TABS
1000.0000 ug | ORAL_TABLET | Freq: Every day | ORAL | Status: DC
  Administered 2021-04-22: 1000 ug via ORAL
  Filled 2021-04-22: qty 1

## 2021-04-22 MED ORDER — ENSURE ENLIVE PO LIQD
237.0000 mL | Freq: Two times a day (BID) | ORAL | Status: DC
  Filled 2021-04-22: qty 237

## 2021-04-22 MED ORDER — CEPHALEXIN 500 MG PO CAPS
500.0000 mg | ORAL_CAPSULE | Freq: Four times a day (QID) | ORAL | 0 refills | Status: AC
Start: 2021-04-22 — End: 2021-04-25

## 2021-04-22 MED ORDER — AZITHROMYCIN 500 MG PO TABS: 500.0000 mg | ORAL_TABLET | Freq: Every day | ORAL | 0 refills | Status: AC

## 2021-04-22 MED ORDER — ALBUTEROL SULFATE (2.5 MG/3ML) 0.083% IN NEBU: 2.5000 mg | INHALATION_SOLUTION | Freq: Four times a day (QID) | RESPIRATORY_TRACT | Status: DC | PRN

## 2021-04-22 MED ORDER — APIXABAN 5 MG PO TABS
5.0000 mg | ORAL_TABLET | Freq: Two times a day (BID) | ORAL | Status: DC
  Administered 2021-04-22: 5 mg via ORAL
  Filled 2021-04-22: qty 1

## 2021-04-22 MED ORDER — LISINOPRIL 5 MG PO TABS
5.0000 mg | ORAL_TABLET | Freq: Every day | ORAL | Status: DC
Start: 2021-04-22 — End: 2021-04-22
  Administered 2021-04-22: 5 mg via ORAL
  Filled 2021-04-22: qty 1

## 2021-04-22 MED ORDER — ALPRAZOLAM 0.5 MG PO TABS: 0.5000 mg | ORAL_TABLET | Freq: Every day | ORAL | Status: DC | PRN

## 2021-04-22 MED ORDER — VITAMIN D 25 MCG (1000 UNIT) PO TABS
1000.0000 [IU] | ORAL_TABLET | Freq: Every day | ORAL | Status: DC
Start: 2021-04-22 — End: 2021-04-22
  Administered 2021-04-22: 1000 [IU] via ORAL
  Filled 2021-04-22: qty 1

## 2021-04-22 NOTE — Assessment & Plan Note (Deleted)
-  continue statin and beta-blocker ?

## 2021-04-22 NOTE — Plan of Care (Signed)
The patient is admitted to [address] with the diagnosis of CAP. A & O x 4. Full assessment to epic completed. Patient voiced no concern. Will continue to monitor. ?

## 2021-04-22 NOTE — Assessment & Plan Note (Signed)
-   Multiple pulmonary nodules noted on CT chest with nodularity seen on left breast.  Patient would like to forego any further malignancy work-up as stated above ?

## 2021-04-22 NOTE — Discharge Summary (Signed)
Physician Discharge Summary  Crystal Osborne BLT:[id number] DOB: 09-01-1931 DOA: 04/21/2021  PCP: Colon Branch, MD  Admit date: 04/21/2021 Discharge date: 04/22/2021  Admitted From: Home Disposition:  Home  Recommendations for Outpatient Follow-up:  Follow up with PCP in 1 week Patient declined work up and treatment for CT findings concerning for left breast nodules and bilateral pulmonary nodule. Instructed pt that if she changes her mind, she can follow up with PCP to arrange for oncology referral.   Discharge Condition: Stable, improved CODE STATUS: DNR  Diet recommendation: Regular   Brief/Interim Summary: From H&P by Dr. Flossie Buffy: "Crystal Osborne is a 88 y.o. female with medical history significant of hypertrophic cardiomyopathy, CVA, AAA, hypertension, CAD, COPD, dementia, history of breast cancer, DVT on Eliquis who presents with concerns of hypoxia.   Patient reports that for the past several days she has had ongoing shortness of breath both at rest and with exertion.  Sometimes she would wake up with shortness of breath.  Has nonproductive cough.  She also has been noting nausea and diarrhea but no vomiting.  States she was started on antibiotic yesterday for presumptive "kidney infection" but she denies any dysuria, increased urgency or frequency.   In the ED, she was afebrile mildly hypertensive and stable on room air.  Mild leukocytosis of 10.7.  CMP unremarkable.  Troponin elevated at 43 but down trended to 37 on repeat.EKG shows sinus rhythm with ventricular bigeminy and LVH.   CT chest with no pulmonary embolism.  Bilateral pleural effusion.  Right middle lobe opacity favoring infection.  There is also incidental findings of multiple bilateral pulmonary nodule and nodularity in the left breast.  Cannot exclude pulmonary metastasis and possible left breast malignancy.    Patient has not had a mammogram for some time.  Has not been following with oncology since her oncologist passed away a  few years ago.  She continues to smoke about 4 to 5 cigarettes/day.  Patient feels that at this point in her life she would rather not know whether she had cancer since she does not want to undergo any treatment."  Subjective on day of discharge: Patient remains stable on room air, states she is feeling well overall. Continues to decline further work up for left breast nodules and bilateral pulmonary nodules.   Discharge Diagnoses:  Principal Problem:   Community acquired pneumonia Active Problems:   PERSONAL HX BREAST CANCER   Hypertrophic cardiomyopathy ([place])   Pulmonary nodule   Pressure injury of skin   UTI (urinary tract infection)   History of DVT (deep vein thrombosis)   Coronary artery disease   Demand ischemia ([place])     In agreement with assessment of the pressure ulcer as below:  Pressure Injury 04/22/21 Buttocks Mid;Medial Stage 2 -  Partial thickness loss of dermis presenting as a shallow open injury with a red, pink wound bed without slough. (Active)  04/22/21 0130  Location: Buttocks  Location Orientation: Mid;Medial  Staging: Stage 2 -  Partial thickness loss of dermis presenting as a shallow open injury with a red, pink wound bed without slough.  Wound Description (Comments):   Present on Admission: Yes       Discharge Instructions  Discharge Instructions     Call MD for:  difficulty breathing, headache or visual disturbances   Complete by: As directed    Call MD for:  extreme fatigue   Complete by: As directed    Call MD for:  persistant dizziness or  light-headedness   Complete by: As directed    Call MD for:  persistant nausea and vomiting   Complete by: As directed    Call MD for:  severe uncontrolled pain   Complete by: As directed    Call MD for:  temperature >100.4   Complete by: As directed    Diet - low sodium heart healthy   Complete by: As directed    Discharge instructions   Complete by: As directed    You were cared for by a hospitalist  during your hospital stay. If you have any questions about your discharge medications or the care you received while you were in the hospital after you are discharged, you can call the unit and ask to speak with the hospitalist on call if the hospitalist that took care of you is not available. Once you are discharged, your primary care physician will handle any further medical issues. Please note that NO REFILLS for any discharge medications will be authorized once you are discharged, as it is imperative that you return to your primary care physician (or establish a relationship with a primary care physician if you do not have one) for your aftercare needs so that they can reassess your need for medications and monitor your lab values.   Increase activity slowly   Complete by: As directed    No wound care   Complete by: As directed       Allergies as of 04/22/2021       Reactions   Septra [sulfamethoxazole-trimethoprim] Itching   Pt states she had a severe allergic reaction; she was in the ICU for 2 days because of taking this.   Tramadol Other (See Comments)   Was given during surgery? CANNOT TAKE!! The patient felt like she was "spaced out"   Ciprofloxacin Hcl Itching   Codeine Rash, Other (See Comments)   Made the patient feel "very strange"        Medication List     STOP taking these medications    nitrofurantoin (macrocrystal-monohydrate) 100 MG capsule Commonly known as: MACROBID       TAKE these medications    acetaminophen 500 MG tablet Commonly known as: TYLENOL Take 2 tablets (1,000 mg total) by mouth every 6 (six) hours as needed (for pain).   albuterol 108 (90 Base) MCG/ACT inhaler Commonly known as: VENTOLIN HFA INHALE 2 PUFFS INTO THE LUNGS EVERY 6 HOURS AS NEEDED FOR WHEEZING OR SHORTNESS OF BREATH   ALPRAZolam 0.5 MG tablet Commonly known as: Xanax 1 or 1.5 tab at bed time prn insomnia What changed:  how much to take how to take this when to take  this reasons to take this additional instructions   apixaban 5 MG Tabs tablet Commonly known as: ELIQUIS Take 1 tablet (5 mg total) by mouth 2 (two) times daily.   atorvastatin 40 MG tablet Commonly known as: LIPITOR TAKE 1 TABLET(40 MG) BY MOUTH DAILY What changed:  how much to take how to take this when to take this additional instructions   azithromycin 500 MG tablet Commonly known as: Zithromax Take 1 tablet (500 mg total) by mouth daily for 3 days.   cephALEXin 500 MG capsule Commonly known as: KEFLEX Take 1 capsule (500 mg total) by mouth 4 (four) times daily for 3 days.   furosemide 20 MG tablet Commonly known as: LASIX Take 1 tablet (20 mg total) by mouth as needed for edema. What changed: when to take this   hydroxypropyl  methylcellulose / hypromellose 2.5 % ophthalmic solution Commonly known as: ISOPTO TEARS / GONIOVISC Place 1 drop into both eyes 2 (two) times daily as needed for dry eyes.   lisinopril 5 MG tablet Commonly known as: ZESTRIL Take 1 tablet (5 mg total) by mouth in the morning and at bedtime.   metoprolol succinate 25 MG 24 hr tablet Commonly known as: Toprol XL Take 0.5 tablets (12.5 mg total) by mouth daily. What changed: when to take this   vitamin B-12 1000 MCG tablet Commonly known as: CYANOCOBALAMIN Take 1,000 mcg by mouth daily.   VITAMIN D3 PO Take 1 capsule by mouth daily.        Follow-up Information     Colon Branch, MD. Schedule an appointment as soon as possible for a visit in 1 week(s).   Specialty: Internal Medicine Contact information: [address] [place] [address] [place] [address] [phone]         Lorretta Harp, MD .   Specialties: Cardiology, Radiology Contact information: [address] [place] [address] [place] [address] [phone]                Allergies  Allergen Reactions   Septra [Sulfamethoxazole-Trimethoprim] Itching    Pt states she had a severe allergic reaction; she  was in the ICU for 2 days because of taking this.   Tramadol Other (See Comments)    Was given during surgery? CANNOT TAKE!! The patient felt like she was "spaced out"   Ciprofloxacin Hcl Itching   Codeine Rash and Other (See Comments)    Made the patient feel "very strange"     Procedures/Studies: CT Angio Chest PE W and/or Wo Contrast  Result Date: 04/21/2021 CLINICAL DATA:  Pulmonary embolism suspected. "High probability". Prior right mastectomy. * onc * EXAM: CT ANGIOGRAPHY CHEST WITH CONTRAST TECHNIQUE: Multidetector CT imaging of the chest was performed using the standard protocol during bolus administration of intravenous contrast. Multiplanar CT image reconstructions and MIPs were obtained to evaluate the vascular anatomy. RADIATION DOSE REDUCTION: This exam was performed according to the departmental dose-optimization program which includes automated exposure control, adjustment of the mA and/or kV according to patient size and/or use of iterative reconstruction technique. CONTRAST:  61m OMNIPAQUE IOHEXOL 350 MG/ML SOLN COMPARISON:  11/09/2020 chest radiograph.  No prior chest CT. FINDINGS: Cardiovascular: The quality of this exam for evaluation of pulmonary embolism is good. The bolus is well timed. Mild limitations secondary to patient arm position, not raised above the head. No evidence of pulmonary embolism. Advanced aortic and branch vessel atherosclerosis. Moderate cardiomegaly with left ventricular hypertrophy. Three vessel coronary artery calcification. Mediastinum/Nodes: Precarinal node measures 1.3 cm on 59/5. Right hilar node measures 1.3 cm on 61/5. Lungs/Pleura: Small, right larger than left pleural effusions. Biapical pleuroparenchymal scarring. Centrilobular and paraseptal emphysema. Dependent bibasilar atelectasis. Minimal right apical ground-glass laterally on 29/6. Anteromedial right middle lobe opacities including on images 94 and 95 of series 6 are favored to represent foci  of infection. Within both lungs there are pulmonary nodules. These are identified on series 6. Example in the right middle lobe at 3 mm on 93/6, right lower lobe at 4 mm on 105/6, and left lower lobe at 5 mm on 116/6. Superior segment right lower lobe 6 mm nodule on 53/6. Mucoid impaction in the lingula. Upper Abdomen: Normal imaged portions of the liver, spleen, stomach, pancreas, adrenal glands, kidneys. Infrarenal abdominal aortic dilatation at 3.5 x 3.1 cm on 166/5, incompletely imaged.  Musculoskeletal: Right mastectomy. Left breast nodularity laterally, including at 2.5 cm on 108/5. No acute osseous abnormality. Review of the MIP images confirms the above findings. IMPRESSION: 1.  No evidence of pulmonary embolism. 2. Bilateral pleural effusions. Anteromedial right middle lobe opacities are favored to represent areas of infection. Technically indeterminate. Consider antibiotic therapy and CT follow-up at 6 weeks. 3. Multiple bilateral pulmonary nodules. Prior right mastectomy with apparent nodularity in the left breast. Cannot exclude pulmonary metastasis and possible left breast metachronous primary. Consider multidisciplinary thoracic oncology consultation as well as physical exam correlation. 4. Thoracic adenopathy which could be reactive or metastatic. 5. Aortic atherosclerosis (ICD10-I70.0), coronary artery atherosclerosis and emphysema (ICD10-J43.9). 6. Infrarenal abdominal aortic dilatation, incompletely imaged. Recommend follow-up ultrasound every 2 years. This recommendation follows ACR consensus guidelines: White Paper of the ACR Incidental Findings Committee II on Vascular Findings. J Am Coll Radiol 2013; 10:789-794. Electronically Signed   By: Abigail Miyamoto M.D.   On: 04/21/2021 16:58       Discharge Exam: Vitals:   04/22/21 0400 04/22/21 0753  BP: (!) 152/63 111/61  Pulse: (!) 45 70  Resp: 16 16  Temp: 97.7 F (36.5 C) 98.7 F (37.1 C)  SpO2: (!) 89% 91%    General: Pt is alert,  awake, not in acute distress Cardiovascular: RRR, S1/S2 +, no edema Respiratory: CTA bilaterally, no wheezing, no rhonchi, no respiratory distress, no conversational dyspnea, on room air  Abdominal: Soft, NT, ND, bowel sounds + Extremities: no edema, no cyanosis Psych: Normal mood and affect, stable judgement and insight     The results of significant diagnostics from this hospitalization (including imaging, microbiology, ancillary and laboratory) are listed below for reference.     Microbiology: Recent Results (from the past 240 hour(s))  Resp Panel by RT-PCR (Flu A&B, Covid) Nasopharyngeal Swab     Status: None   Collection Time: 04/21/21  2:31 PM   Specimen: Nasopharyngeal Swab; Nasopharyngeal(NP) swabs in vial transport medium  Result Value Ref Range Status   SARS Coronavirus 2 by RT PCR NEGATIVE NEGATIVE Final    Comment: (NOTE) SARS-CoV-2 target nucleic acids are NOT DETECTED.  The SARS-CoV-2 RNA is generally detectable in upper respiratory specimens during the acute phase of infection. The lowest concentration of SARS-CoV-2 viral copies this assay can detect is 138 copies/mL. A negative result does not preclude SARS-Cov-2 infection and should not be used as the sole basis for treatment or other patient management decisions. A negative result may occur with  improper specimen collection/handling, submission of specimen other than nasopharyngeal swab, presence of viral mutation(s) within the areas targeted by this assay, and inadequate number of viral copies(<138 copies/mL). A negative result must be combined with clinical observations, patient history, and epidemiological information. The expected result is Negative.  Fact Sheet for Patients:  EntrepreneurPulse.com.au  Fact Sheet for Healthcare Providers:  IncredibleEmployment.be  This test is no t yet approved or cleared by the [place] FDA and  has been authorized for detection  and/or diagnosis of SARS-CoV-2 by FDA under an Emergency Use Authorization (EUA). This EUA will remain  in effect (meaning this test can be used) for the duration of the COVID-19 declaration under Section 564(b)(1) of the Act, 21 U.S.C.section 360bbb-3(b)(1), unless the authorization is terminated  or revoked sooner.       Influenza A by PCR NEGATIVE NEGATIVE Final   Influenza B by PCR NEGATIVE NEGATIVE Final    Comment: (NOTE) The Xpert Xpress SARS-CoV-2/FLU/RSV plus assay is intended as  an aid in the diagnosis of influenza from Nasopharyngeal swab specimens and should not be used as a sole basis for treatment. Nasal washings and aspirates are unacceptable for Xpert Xpress SARS-CoV-2/FLU/RSV testing.  Fact Sheet for Patients: EntrepreneurPulse.com.au  Fact Sheet for Healthcare Providers: IncredibleEmployment.be  This test is not yet approved or cleared by the [place] FDA and has been authorized for detection and/or diagnosis of SARS-CoV-2 by FDA under an Emergency Use Authorization (EUA). This EUA will remain in effect (meaning this test can be used) for the duration of the COVID-19 declaration under Section 564(b)(1) of the Act, 21 U.S.C. section 360bbb-3(b)(1), unless the authorization is terminated or revoked.  Performed at [facility], [place] [address] [place], [place] [address]      Labs: BNP (last 3 results) Recent Labs    11/08/20 1018  BNP 6,712.4*   Basic Metabolic Panel: Recent Labs  Lab 04/21/21 1501  NA 133*  K 4.1  CL 98  CO2 23  GLUCOSE 151*  BUN 25*  CREATININE 0.96  CALCIUM 8.8*   Liver Function Tests: Recent Labs  Lab 04/21/21 1501  AST 31  ALT 22  ALKPHOS 86  BILITOT 0.7  PROT 6.3*  ALBUMIN 3.0*   No results for input(s): LIPASE, AMYLASE in the last 168 hours. No results for input(s): AMMONIA in the last 168 hours. CBC: Recent Labs  Lab 04/21/21 1501 04/22/21 0216  WBC 10.7*  8.5  NEUTROABS 9.3*  --   HGB 13.6 12.8  HCT 40.9 38.6  MCV 94.9 92.8  PLT 192 165   Cardiac Enzymes: No results for input(s): CKTOTAL, CKMB, CKMBINDEX, TROPONINI in the last 168 hours. BNP: Invalid input(s): POCBNP CBG: No results for input(s): GLUCAP in the last 168 hours. D-Dimer No results for input(s): DDIMER in the last 72 hours. Hgb A1c No results for input(s): HGBA1C in the last 72 hours. Lipid Profile No results for input(s): CHOL, HDL, LDLCALC, TRIG, CHOLHDL, LDLDIRECT in the last 72 hours. Thyroid function studies No results for input(s): TSH, T4TOTAL, T3FREE, THYROIDAB in the last 72 hours.  Invalid input(s): FREET3 Anemia work up No results for input(s): VITAMINB12, FOLATE, FERRITIN, TIBC, IRON, RETICCTPCT in the last 72 hours. Urinalysis    Component Value Date/Time   COLORURINE YELLOW 04/21/2021 1510   APPEARANCEUR HAZY (A) 04/21/2021 1510   LABSPEC 1.013 04/21/2021 1510   PHURINE 6.0 04/21/2021 1510   GLUCOSEU NEGATIVE 04/21/2021 1510   GLUCOSEU NEGATIVE 12/11/2020 1109   HGBUR SMALL (A) 04/21/2021 1510   HGBUR negative 09/25/2009 1034   BILIRUBINUR NEGATIVE 04/21/2021 1510   BILIRUBINUR Neg 05/26/2013 0827   KETONESUR NEGATIVE 04/21/2021 1510   PROTEINUR 30 (A) 04/21/2021 1510   UROBILINOGEN 0.2 12/11/2020 1109   NITRITE NEGATIVE 04/21/2021 1510   LEUKOCYTESUR MODERATE (A) 04/21/2021 1510   Sepsis Labs Invalid input(s): PROCALCITONIN,  WBC,  LACTICIDVEN Microbiology Recent Results (from the past 240 hour(s))  Resp Panel by RT-PCR (Flu A&B, Covid) Nasopharyngeal Swab     Status: None   Collection Time: 04/21/21  2:31 PM   Specimen: Nasopharyngeal Swab; Nasopharyngeal(NP) swabs in vial transport medium  Result Value Ref Range Status   SARS Coronavirus 2 by RT PCR NEGATIVE NEGATIVE Final    Comment: (NOTE) SARS-CoV-2 target nucleic acids are NOT DETECTED.  The SARS-CoV-2 RNA is generally detectable in upper respiratory specimens during the acute  phase of infection. The lowest concentration of SARS-CoV-2 viral copies this assay can detect is 138 copies/mL. A negative result  does not preclude SARS-Cov-2 infection and should not be used as the sole basis for treatment or other patient management decisions. A negative result may occur with  improper specimen collection/handling, submission of specimen other than nasopharyngeal swab, presence of viral mutation(s) within the areas targeted by this assay, and inadequate number of viral copies(<138 copies/mL). A negative result must be combined with clinical observations, patient history, and epidemiological information. The expected result is Negative.  Fact Sheet for Patients:  EntrepreneurPulse.com.au  Fact Sheet for Healthcare Providers:  IncredibleEmployment.be  This test is no t yet approved or cleared by the [place] FDA and  has been authorized for detection and/or diagnosis of SARS-CoV-2 by FDA under an Emergency Use Authorization (EUA). This EUA will remain  in effect (meaning this test can be used) for the duration of the COVID-19 declaration under Section 564(b)(1) of the Act, 21 U.S.C.section 360bbb-3(b)(1), unless the authorization is terminated  or revoked sooner.       Influenza A by PCR NEGATIVE NEGATIVE Final   Influenza B by PCR NEGATIVE NEGATIVE Final    Comment: (NOTE) The Xpert Xpress SARS-CoV-2/FLU/RSV plus assay is intended as an aid in the diagnosis of influenza from Nasopharyngeal swab specimens and should not be used as a sole basis for treatment. Nasal washings and aspirates are unacceptable for Xpert Xpress SARS-CoV-2/FLU/RSV testing.  Fact Sheet for Patients: EntrepreneurPulse.com.au  Fact Sheet for Healthcare Providers: IncredibleEmployment.be  This test is not yet approved or cleared by the [place] FDA and has been authorized for detection and/or diagnosis of  SARS-CoV-2 by FDA under an Emergency Use Authorization (EUA). This EUA will remain in effect (meaning this test can be used) for the duration of the COVID-19 declaration under Section 564(b)(1) of the Act, 21 U.S.C. section 360bbb-3(b)(1), unless the authorization is terminated or revoked.  Performed at [facility], [place] [address], [place], [place] [address]      Patient was seen and examined on the day of discharge and was found to be in stable condition. Time coordinating discharge: 35 minutes including assessment and coordination of care, as well as examination of the patient.   SIGNED:  Dessa Phi, DO [facility] 04/22/2021, 10:07 AM

## 2021-04-22 NOTE — Progress Notes (Signed)
?   04/22/21 1000  ?Clinical Encounter Type  ?Visited With Patient  ?Visit Type Initial;Spiritual support  ?Referral From Nurse  ?Consult/Referral To Chaplain  ?Spiritual Encounters  ?Spiritual Needs Prayer;Emotional  ? ?Chaplain visited patient who had requested prayer.Chaplain encouraged story telling and life reflections as patient acknowledged her feelings. Patient shared preparation for end of life and acknowledged being tired. Chaplain acknowledged patient's feelings and provided prayer of comfort, hope, and peace regarding the her future health.  ? ?Melody Haver, Resident Chaplain ?([phone] ?  ?

## 2021-04-22 NOTE — Assessment & Plan Note (Signed)
-  remote hx with right mastectomy.  Unfortunately, CT chest today showing nodularity to left breast.  Patient has not had a recent mammogram.  However she expressed that at this point in her life, she would rather not find out whether or not she has cancer since she does not want to undergo any further treatment. ?

## 2021-04-22 NOTE — Assessment & Plan Note (Addendum)
Right middle lobe pneumonia ?-continue IV Rocephin and Azithromycin ?-incentive spirometry while awake  ?

## 2021-04-22 NOTE — Progress Notes (Signed)
?  Transition of Care (TOC) Screening Note ? ? ?Patient Details  ?Name: Crystal Osborne ?Date of Birth: 23-Dec-1931 ? ? ?Transition of Care (TOC) CM/SW Contact:    ?Cyndi Bender, RN ?Phone Number: ?04/22/2021, 9:34 AM ? ? ? ?Transition of Care Department [facility]) has reviewed patient and no TOC needs have been identified at this time. We will continue to monitor patient advancement through interdisciplinary progression rounds. If new patient transition needs arise, please place a TOC consult. ? ? ?

## 2021-04-22 NOTE — Assessment & Plan Note (Signed)
-  stable. Continue beta blocker ?

## 2021-04-22 NOTE — Assessment & Plan Note (Addendum)
Stage II pressure ulcer noted by RN.  Wound care daily. ?

## 2021-04-22 NOTE — Assessment & Plan Note (Signed)
-  continue statin and beta-blocker ?

## 2021-04-22 NOTE — Assessment & Plan Note (Signed)
Patient had left lower extremity DVT found on 01/2021.  Continue Eliquis daily. ?

## 2021-04-22 NOTE — Assessment & Plan Note (Signed)
-  Questionable UTI.  Patient recently diagnosed with UTI outpatient and started on Macrobid.  However she denies any only notes symptoms of nausea and diarrhea.  Repeat UA.  Already on IV antibiotics for community-acquired pneumonia. ?

## 2021-04-23 ENCOUNTER — Telehealth: Payer: Self-pay | Admitting: Internal Medicine

## 2021-04-23 NOTE — Telephone Encounter (Signed)
[facility] called regarding pt. ? ? ? ?[facility] received referral. [facility] contacted pt regarding start of care. Pt has pneumonia at this time care will be delayed will contact office with start information once pt is better ? ?[facility] - [phone] ?

## 2021-04-23 NOTE — Telephone Encounter (Signed)
Noted  

## 2021-04-24 ENCOUNTER — Telehealth: Payer: Self-pay

## 2021-04-24 NOTE — Telephone Encounter (Addendum)
Transition Care Management Unsuccessful Follow-up Telephone Call ?TCM DC [facility] 04-22-21 Dx: community acquired pneumonia ?Date of discharge and from where:   ? ?Attempts:  1st Attempt ? ?Reason for unsuccessful TCM follow-up call:  Left voice message ? ?Transition Care Management Unsuccessful Follow-up Telephone Call ? ?Date of discharge and from where:  [facility] 04-22-21 Dx: community acquired pneumonia ? ?Attempts:  2nd Attempt ? ?Reason for unsuccessful TCM follow-up call:  Left voice message ? ?  ? ?  ?

## 2021-04-26 ENCOUNTER — Other Ambulatory Visit: Payer: Self-pay

## 2021-04-26 ENCOUNTER — Ambulatory Visit (INDEPENDENT_AMBULATORY_CARE_PROVIDER_SITE_OTHER): Payer: Medicare Other | Admitting: Cardiovascular Disease

## 2021-04-26 ENCOUNTER — Encounter: Payer: Self-pay | Admitting: Cardiovascular Disease

## 2021-04-26 DIAGNOSIS — E782 Mixed hyperlipidemia: Secondary | ICD-10-CM

## 2021-04-26 DIAGNOSIS — I739 Peripheral vascular disease, unspecified: Secondary | ICD-10-CM

## 2021-04-26 DIAGNOSIS — Z86718 Personal history of other venous thrombosis and embolism: Secondary | ICD-10-CM | POA: Diagnosis not present

## 2021-04-26 DIAGNOSIS — I10 Essential (primary) hypertension: Secondary | ICD-10-CM | POA: Diagnosis not present

## 2021-04-26 DIAGNOSIS — I6523 Occlusion and stenosis of bilateral carotid arteries: Secondary | ICD-10-CM | POA: Diagnosis not present

## 2021-04-26 DIAGNOSIS — F17200 Nicotine dependence, unspecified, uncomplicated: Secondary | ICD-10-CM | POA: Diagnosis not present

## 2021-04-26 NOTE — Assessment & Plan Note (Signed)
History of hyperlipidemia on statin therapy with lipid profile performed 10/24/2019 revealing total cholesterol of 134, LDL 63 and HDL 51. ?

## 2021-04-26 NOTE — Assessment & Plan Note (Signed)
History of carotid artery disease with carotid Dopplers performed 02/18/2021 revealing moderate bilateral ICA stenosis.  Given the patient's age and her cognitive decline I do not feel compelled to continue to follow this. ?

## 2021-04-26 NOTE — Assessment & Plan Note (Signed)
History of left femoral vein DVT 02/01/2021 at which time she was started on Eliquis.  She is scheduled to have follow-up Doppler studies next month.  If she has complete resolution we could stop the Eliquis. ?

## 2021-04-26 NOTE — Assessment & Plan Note (Signed)
History of essential hypertension with blood pressure measured today at 136/68.  She is some mild lisinopril and metoprolol ?

## 2021-04-26 NOTE — Assessment & Plan Note (Signed)
History of peripheral arterial disease status post bilateral common femoral artery PTA and covered stenting using Lifestream stents 12/03/2015.  Her most recent Doppler studies performed 08/20/2020 revealed a right ABI of 0.8 6 and left of 0.79 with patent iliac stents. ?

## 2021-04-26 NOTE — Assessment & Plan Note (Signed)
Ongoing tobacco abuse of a third of a pack per day recalcitrant to risk factor modification. ?

## 2021-04-26 NOTE — Progress Notes (Signed)
? ? ? ?04/26/2021 ?Crystal Osborne   ?08/20/1931  ?[id number] ? ?Primary Physician Colon Branch, MD ?Primary Cardiologist: Lorretta Harp MD Lupe Carney, [place] ? ?HPI:  Crystal Osborne is a 88 y.o.  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 She was referred by Dr. Larose Kells for peripheral vascular evaluation because of lifestyle limiting claudication. I last spoke to her on the phone for a virtual telemedicine phone visit 12/01/2018.  She is accompanied by her son Crystal Osborne today.  Her risk factor profile is notable for continued tobacco abuse of one half pack per day having smoked 35 pack years. She has treated hypertension and hyperlipidemia. There is a family history of heart disease. She has never had a heart attack but that has had a TIA remotely. She denies chest pain or shortness of breath. She's had bilateral lower extremity claudication right slightly worse than left which began 3 months ago. Dopplers performed in our office 10/22/15 revealed a right ABI of 0.68 and a left ABI 0.78. She did have high frequency signals in the origins of both iliac arteries. I performed angiography 12/03/15 revealing high-grade calcified ostial bilateral iliac artery stenoses with three-vessel runoff. I performed diamondback orbital rotational atherectomy, PTA and covered stenting using Lifestream cover stents of both iliac ostia. Her Dopplers improved and her claudication resolved. This has remained durable by Doppler studies today. She had an episode of presyncope on 07/20/16. Her workup was unremarkable. EKG showed no acute changes. She's had no subsequent episodes. ?  ?Since I spoke to her 2 and half years ago she did see Sande Rives in the office 03/11/2021.  Her last lower extremities show Doppler studies performed 08/20/2020 revealed patent iliac stents with a right ABI of 0.86 and a left of 0.79.  She does  have moderate bilateral ICA stenosis by duplex ultrasound.  She had a venous Doppler study performed 01/30/2021 revealing a left femoral DVT and was started on Eliquis.  She was recently hospitalized for shortness of breath 04/22/2021 and apparently had community-acquired pneumonia and was placed on oral antibiotics.  There was mention of breast masses on CT although she did not want this to be further evaluated.  She lives with her son Crystal Osborne.  She is minimally ambulatory and is fairly weak.  She has had cognitive decline consistent with dementia. ? ? ?Current Meds  ?Medication Sig  ? acetaminophen (TYLENOL) 500 MG tablet Take 2 tablets (1,000 mg total) by mouth every 6 (six) hours as needed (for pain).  ? albuterol (VENTOLIN HFA) 108 (90 Base) MCG/ACT inhaler INHALE 2 PUFFS INTO THE LUNGS EVERY 6 HOURS AS NEEDED FOR WHEEZING OR SHORTNESS OF BREATH (Patient taking differently: Inhale 2 puffs into the lungs every 6 (six) hours as needed for wheezing or shortness of breath.)  ? ALPRAZolam (XANAX) 0.5 MG tablet 1 or 1.5 tab at bed time prn insomnia (Patient taking differently: Take 0.5-0.75 mg by mouth daily as needed for anxiety or sleep.)  ? apixaban (ELIQUIS) 5 MG TABS tablet Take 1 tablet (5 mg total) by mouth 2 (two) times daily.  ? atorvastatin (LIPITOR) 40 MG tablet TAKE 1 TABLET(40 MG) BY MOUTH DAILY (Patient taking differently: Take 40 mg by mouth at bedtime.)  ? Cholecalciferol (VITAMIN D3 PO) Take 1 capsule by mouth daily.  ? furosemide (LASIX) 20 MG tablet Take 1 tablet (20 mg total) by  mouth as needed for edema. (Patient taking differently: Take 20 mg by mouth daily as needed for edema.)  ? hydroxypropyl methylcellulose / hypromellose (ISOPTO TEARS / GONIOVISC) 2.5 % ophthalmic solution Place 1 drop into both eyes 2 (two) times daily as needed for dry eyes.  ? lisinopril (ZESTRIL) 5 MG tablet Take 1 tablet (5 mg total) by mouth in the morning and at bedtime.  ? metoprolol succinate (TOPROL XL) 25 MG 24 hr  tablet Take 0.5 tablets (12.5 mg total) by mouth daily. (Patient taking differently: Take 12.5 mg by mouth in the morning.)  ? vitamin B-12 (CYANOCOBALAMIN) 1000 MCG tablet Take 1,000 mcg by mouth daily.  ?  ? ?Allergies  ?Allergen Reactions  ? Septra [Sulfamethoxazole-Trimethoprim] Itching  ?  Pt states she had a severe allergic reaction; she was in the ICU for 2 days because of taking this.  ? Tramadol Other (See Comments)  ?  Was given during surgery? CANNOT TAKE!! The patient felt like she was "spaced out"  ? Ciprofloxacin Hcl Itching  ? Codeine Rash and Other (See Comments)  ?  Made the patient feel "very strange"  ? ? ?Social History  ? ?Socioeconomic History  ? Marital status: Divorced  ?  Spouse name: Not on file  ? Number of children: 3  ? Years of education: Not on file  ? Highest education level: Not on file  ?Occupational History  ? Occupation: retired   ?Tobacco Use  ? Smoking status: Every Day  ?  Packs/day: 0.50  ?  Years: 67.00  ?  Pack years: 33.50  ?  Types: Cigarettes  ? Smokeless tobacco: Never  ? Tobacco comments:  ?  1/2 ppd  ?Vaping Use  ? Vaping Use: Never used  ?Substance and Sexual Activity  ? Alcohol use: No  ?  Alcohol/week: 0.0 standard drinks  ? Drug use: No  ? Sexual activity: Never  ?Other Topics Concern  ? Not on file  ?Social History Narrative  ? Lives by herself , still drives some   ? Has 3 children, 1 living son   ? Emergency contact: Joylene Wescott [phone], he lives in the [place] area  ? Lost a son ~ 2017, he had  lung ca, stroke (09-2011)   ? Lost another son Dominica Severin) 02/17/2019    ? Lost a sister died 94-15  ?    ?    ? ?Social Determinants of Health  ? ?Financial Resource Strain: Not on file  ?Food Insecurity: Not on file  ?Transportation Needs: Not on file  ?Physical Activity: Not on file  ?Stress: Not on file  ?Social Connections: Not on file  ?Intimate Partner Violence: Not on file  ?  ? ?Review of Systems: ?General: negative for chills, fever, night sweats or weight changes.   ?Cardiovascular: negative for chest pain, dyspnea on exertion, edema, orthopnea, palpitations, paroxysmal nocturnal dyspnea or shortness of breath ?Dermatological: negative for rash ?Respiratory: negative for cough or wheezing ?Urologic: negative for hematuria ?Abdominal: negative for nausea, vomiting, diarrhea, bright red blood per rectum, melena, or hematemesis ?Neurologic: negative for visual changes, syncope, or dizziness ?All other systems reviewed and are otherwise negative except as noted above. ? ? ? ?Blood pressure 136/68, pulse 72, height '5\' 6"'$  (1.676 m), weight 104 lb (47.2 kg), SpO2 95 %.  ?General appearance: alert and no distress ?Neck: no adenopathy, no carotid bruit, no JVD, supple, symmetrical, trachea midline, and thyroid not enlarged, symmetric, no tenderness/mass/nodules ?Lungs: clear to auscultation bilaterally ?Heart:  regular rate and rhythm, S1, S2 normal, no murmur, click, rub or gallop ?Extremities: extremities normal, atraumatic, no cyanosis or edema ?Pulses: 2+ and symmetric ?Skin: Skin color, texture, turgor normal. No rashes or lesions ?Neurologic: Grossly normal ? ?EKG not performed today ? ?ASSESSMENT AND PLAN:  ? ?Hyperlipidemia ?History of hyperlipidemia on statin therapy with lipid profile performed 10/24/2019 revealing total cholesterol of 134, LDL 63 and HDL 51. ? ?TOBACCO ABUSE ?Ongoing tobacco abuse of a third of a pack per day recalcitrant to risk factor modification. ? ?Hypertension ?History of essential hypertension with blood pressure measured today at 136/68.  She is some mild lisinopril and metoprolol ? ?Carotid artery disease ([place]) ?History of carotid artery disease with carotid Dopplers performed 02/18/2021 revealing moderate bilateral ICA stenosis.  Given the patient's age and her cognitive decline I do not feel compelled to continue to follow this. ? ?PVD (peripheral vascular disease) ([place]) ?History of peripheral arterial disease status post bilateral common femoral  artery PTA and covered stenting using Lifestream stents 12/03/2015.  Her most recent Doppler studies performed 08/20/2020 revealed a right ABI of 0.8 6 and left of 0.79 with patent iliac stents. ? ?History of

## 2021-04-26 NOTE — Patient Instructions (Signed)
Medication Instructions:  ?No changes ?*If you need a refill on your cardiac medications before your next appointment, please call your pharmacy* ? ? ?Lab Work: ?None ordered ?If you have labs (blood work) drawn today and your tests are completely normal, you will receive your results only by: ?MyChart Message (if you have MyChart) OR ?A paper copy in the mail ?If you have any lab test that is abnormal or we need to change your treatment, we will call you to review the results. ? ? ?Testing/Procedures: ?None ordered ? ? ?Follow-Up: ?At [facility], you and your health needs are our priority.  As part of our continuing mission to provide you with exceptional heart care, we have created designated Provider Care Teams.  These Care Teams include your primary Cardiologist (physician) and Advanced Practice Providers (APPs -  Physician Assistants and Nurse Practitioners) who all work together to provide you with the care you need, when you need it. ? ?We recommend signing up for the patient portal called "MyChart".  Sign up information is provided on this After Visit Summary.  MyChart is used to connect with patients for Virtual Visits (Telemedicine).  Patients are able to view lab/test results, encounter notes, upcoming appointments, etc.  Non-urgent messages can be sent to your provider as well.   ?To learn more about what you can do with MyChart, go to [email or web address].   ? ?Your next appointment:   ?Keep your follow up appointment with [facility] ?Follow up with Dr. Gwenlyn Found in 12 months ? ?

## 2021-04-27 DIAGNOSIS — Z743 Need for continuous supervision: Secondary | ICD-10-CM | POA: Diagnosis not present

## 2021-04-27 DIAGNOSIS — R509 Fever, unspecified: Secondary | ICD-10-CM | POA: Diagnosis not present

## 2021-04-27 DIAGNOSIS — R062 Wheezing: Secondary | ICD-10-CM | POA: Diagnosis not present

## 2021-04-27 DIAGNOSIS — I499 Cardiac arrhythmia, unspecified: Secondary | ICD-10-CM | POA: Diagnosis not present

## 2021-04-27 DIAGNOSIS — R6889 Other general symptoms and signs: Secondary | ICD-10-CM | POA: Diagnosis not present

## 2021-04-29 ENCOUNTER — Other Ambulatory Visit: Payer: Self-pay | Admitting: Internal Medicine

## 2021-04-29 DIAGNOSIS — E782 Mixed hyperlipidemia: Secondary | ICD-10-CM

## 2021-04-30 ENCOUNTER — Ambulatory Visit (INDEPENDENT_AMBULATORY_CARE_PROVIDER_SITE_OTHER): Payer: Medicare Other | Admitting: Internal Medicine

## 2021-04-30 ENCOUNTER — Encounter: Payer: Self-pay | Admitting: Internal Medicine

## 2021-04-30 VITALS — BP 126/78 | HR 37 | Temp 98.0°F | Resp 18 | Ht 65.0 in | Wt 104.0 lb

## 2021-04-30 DIAGNOSIS — S43085D Other dislocation of left shoulder joint, subsequent encounter: Secondary | ICD-10-CM | POA: Diagnosis not present

## 2021-04-30 DIAGNOSIS — R627 Adult failure to thrive: Secondary | ICD-10-CM

## 2021-04-30 DIAGNOSIS — R2681 Unsteadiness on feet: Secondary | ICD-10-CM | POA: Diagnosis not present

## 2021-04-30 DIAGNOSIS — C50912 Malignant neoplasm of unspecified site of left female breast: Secondary | ICD-10-CM

## 2021-04-30 DIAGNOSIS — N390 Urinary tract infection, site not specified: Secondary | ICD-10-CM | POA: Diagnosis not present

## 2021-04-30 DIAGNOSIS — M6281 Muscle weakness (generalized): Secondary | ICD-10-CM | POA: Diagnosis not present

## 2021-04-30 DIAGNOSIS — S4292XD Fracture of left shoulder girdle, part unspecified, subsequent encounter for fracture with routine healing: Secondary | ICD-10-CM | POA: Diagnosis not present

## 2021-04-30 DIAGNOSIS — Z9181 History of falling: Secondary | ICD-10-CM | POA: Diagnosis not present

## 2021-04-30 MED ORDER — HYDROCODONE-ACETAMINOPHEN 5-325 MG PO TABS: 1.0000 | ORAL_TABLET | Freq: Four times a day (QID) | ORAL | 0 refills | Status: AC | PRN

## 2021-04-30 NOTE — Patient Instructions (Addendum)
We are referring you to hospice ? ?Continue to Xanax to help with sleep ? ?For pain: ?If you have mild pain you can take Tylenol 500 mg 1 or 2 tablet every 6 hours. ?If the pain is moderate to severe: You can take instead hydrocodone (painkiller) every 6 hours as needed. ?Watch for side effects including somnolence or mental status changes ? ?Stop taking any antibiotic ? ?Bring a clean urine sample next week in the cup we are providing. ? ?If you have any of the following, either call or go to the ER if symptoms severe: ?Ongoing mucus in the stools ?Increasing lower stomach pain ?Blood in the urine, blood in the stools. ?High fever or shortness of breath ? ?Schedule your next visit (virtual) in 4 weeks ?

## 2021-04-30 NOTE — Progress Notes (Signed)
? ?Subjective:  ? ? Patient ID: Crystal Osborne, female    DOB: 08-26-1931, 88 y.o.   MRN: [id number] ? ?DOS:  04/30/2021 ?Type of visit - description: Hospital follow-up, TCM, here with his son ? ?Admitted to hospital and discharged 04/22/2021. ?Presented to the ER with several days history of SOB, nonproductive cough, some nausea and diarrhea but no vomiting. ?1 day prior to admission she was seen at urgent care and prescribed Macrobid for a urinary infection. ? ?At the ED, CT chest showed no PE but multiple bilateral pulmonary nodules, B pleural effusions, nodularity of the left breast. ?"Cannot exclude pulmonary metastasis and possibly L breast malignancy". ? ?She was treated for community-acquired pneumonia. ?Also she had pressure ulcers at the buttocks stage II. ? ?Was discharged home in improved condition on Zithromax and cephalexin. ? ? ?Review of Systems ?Since she left the hospital she is at home under the care of her family. ?Appetite is decreased. ? ?About 3 days ago he developed shortness of breath, called EMS, got a breathing treatment, felt somewhat better, declined to go to the hospital. ? ?Denies nausea, vomiting.  No blood in the stools. ?Has developed loose stools in the last 2 days, has seen mucus mixed with the stools. ?No melena. ? ?Continue with UTI symptoms mostly dysuria.  No gross hematuria.  She also has some on and off lower abdominal discomfort, not worse or better with bowel movements or urination. ? ?Continue with shoulder pain. ? ?Past Medical History:  ?Diagnosis Date  ? AAA (abdominal aortic aneurysm)   ? sees Dr Eden Lathe   ? B12 deficiency   ? Cancer of right breast ([place]) 1960  ? Carotid artery disease 11/28/2008  ? sees Dr Oneida Alar   ? COPD (chronic obstructive pulmonary disease) ([place]) 01/25/2014  ? CVA (cerebrovascular accident) [facility]) 2006  ? Diabetes mellitus ([place]) 05/29/2010  ? A1c 6.0 on October 2011   ? Frequent PVCs   ? Hyperlipidemia   ? Hypertension   ? Osteoporosis 2003  ? PAD  (peripheral artery disease) ([place])   ? Palpitations   ? frequent PVCs (15% burden) noted on monitor in 09/2020  ? Pneumonia ~ 2014; ~ 2015  ? Prolapse of female bladder, acquired   ? Shingles   ? repeated episone 9/11 went to a UC  ? TIA (transient ischemic attack) ~ 2005  ? Transient hypothyroidism   ? d/t thyroidectomy  ? UTI (lower urinary tract infection) 06/2011  ? ? ?Past Surgical History:  ?Procedure Laterality Date  ? ABDOMINAL HYSTERECTOMY    ? no oophorectomy  ? CATARACT EXTRACTION W/ INTRAOCULAR LENS  IMPLANT, BILATERAL Bilateral   ? DILATION AND CURETTAGE OF UTERUS    ? MASTECTOMY, RADICAL Right 1960s  ? remotely   ? PERIPHERAL VASCULAR CATHETERIZATION  12/03/2015  ? PERIPHERAL VASCULAR CATHETERIZATION N/A 12/03/2015  ? Procedure: Abdominal Aortogram;  Surgeon: Lorretta Harp, MD;  Location: [place] CV LAB;  Service: Cardiovascular;  Laterality: N/A;  ? PERIPHERAL VASCULAR CATHETERIZATION Bilateral 12/03/2015  ? Procedure: Lower Extremity Angiography;  Surgeon: Lorretta Harp, MD;  Location: [place] CV LAB;  Service: Cardiovascular;  Laterality: Bilateral;  ? PERIPHERAL VASCULAR CATHETERIZATION Bilateral 12/03/2015  ? Procedure: Peripheral Vascular Intervention;  Surgeon: Lorretta Harp, MD;  Location: [place] CV LAB;  Service: Cardiovascular;  Laterality: Bilateral;  38mx58mm Lifestream bilateral Common Iliac Arteries  ? PERIPHERAL VASCULAR CATHETERIZATION Bilateral 12/03/2015  ? Procedure: Peripheral Vascular Atherectomy;  Surgeon: JLorretta Harp MD;  Location:  [place] INVASIVE CV LAB;  Service: Cardiovascular;  Laterality: Bilateral;  Common Iliac Arteries  ? SHOULDER CLOSED REDUCTION Left 11/08/2020  ? Procedure: CLOSED MANIPULATION SHOULDER;  Surgeon: Tania Ade, MD;  Location: [facility];  Service: Orthopedics;  Laterality: Left;  ? THYROIDECTOMY    ? ? ?Current Outpatient Medications  ?Medication Instructions  ? acetaminophen (TYLENOL) 1,000 mg, Oral, Every 6 hours PRN  ? albuterol  (VENTOLIN HFA) 108 (90 Base) MCG/ACT inhaler INHALE 2 PUFFS INTO THE LUNGS EVERY 6 HOURS AS NEEDED FOR WHEEZING OR SHORTNESS OF BREATH  ? ALPRAZolam (XANAX) 0.5 MG tablet 1 or 1.5 tab at bed time prn insomnia  ? apixaban (ELIQUIS) 5 mg, Oral, 2 times daily  ? atorvastatin (LIPITOR) 40 MG tablet TAKE 1 TABLET BY MOUTH EVERY DAY  ? Cholecalciferol (VITAMIN D3 PO) 1 capsule, Oral, Daily  ? furosemide (LASIX) 20 mg, Oral, As needed  ? HYDROcodone-acetaminophen (NORCO/VICODIN) 5-325 MG tablet 1 tablet, Oral, Every 6 hours PRN  ? hydroxypropyl methylcellulose / hypromellose (ISOPTO TEARS / GONIOVISC) 2.5 % ophthalmic solution 1 drop, Both Eyes, 2 times daily PRN  ? lisinopril (ZESTRIL) 5 mg, Oral, 2 times daily  ? metoprolol succinate (TOPROL XL) 12.5 mg, Oral, Daily  ? vitamin B-12 (CYANOCOBALAMIN) 1,000 mcg, Oral, Daily  ? ? ?   ?Objective:  ? Physical Exam ?BP 126/78 (BP Location: Left Leg, Patient Position: Sitting, Cuff Size: Small)   Pulse (!) 37   Temp 98 ?F (36.7 ?C) (Oral)   Resp 18   Ht '5\' 5"'$  (1.651 m)   Wt 104 lb (47.2 kg)   SpO2 96%   BMI 17.31 kg/m?  ?General:   ?Elderly lady, chronically ill appearing, in no distress.  Sits in a wheelchair, underweight appearing ?HEENT:  ?Normocephalic . Face symmetric, atraumatic ?Lungs:  ?CTA B ?Normal respiratory effort, no intercostal retractions, no accessory muscle use. ?Heart: Occasional irregular beat ?Abdomen:  ?Not distended, soft, non-tender. No rebound or rigidity.   ?Skin: Not pale. Not jaundice ?Lower extremities: no pretibial edema bilaterally  ?Neurologic:  ?alert & oriented X3.  ?Speech normal, gait not tested ?Psych--  ?Cognition and judgment appear intact.  ?Cooperative with normal attention span and concentration.  ?Behavior appropriate. ?No anxious or depressed appearing. ? ?   ?Assessment   ? ?  ?Assessment  ?Prediabetes ?HTN ?Hyperlipidemia ?Insomnia  ?COPD: smoker, XR suggestive of dx, no PFTs ?Osteoporosis 2003-- refused dexas consistently   ?B12 def (185 05-2013) ?CV: ?---CVA 2006 ?---Carotid artery dz--last [place] 09-2011 40-59% B- ICA, declined further testing ?---AAA --last [place]  05-2015, 1 year ?-- PVD, + ABIs 10-2015, 11-2015: B iliac stents ?-- Cardiomyopathy (apical-hypertrophic) echo 09-2020 ?Shingles 10-2009 and ~ 07-2015 (seen elsewhere, L face) ?Status post thyroidectomy, transient hypothyroidism ?Breast cancer 1960 ?Allergies- hives sometimes , saw derm  ? ? ?PLAN ?Hospital follow-up for the following acute issues: ?Community-acquired pneumonia ?Pulmonary nodules (likely mets),  left breast nodules, h/o remote breast ca  UTI ?Since she left the hospital, she is at home;  had an episode of shortness of breath, treated by EMS, got slightly better, declined ER re-evaluation. ?She continue to lose ground physically, weaker, poor appetite, needing help for all her ADLs. ?Mentally however she has improved, she was slightly confused and disoriented when she left the hospital and that is better. ?She asked me directly  if she is dying. ?I told the patient that I believe the CT findings are consistent with breast cancer.  I could not give her a time prognosis  but I believe that at this point her best option is hospice; she is very clear that she does not like any aggressive eval or  treatment regards what we believed  is cancer. ?In summary: ?- Community-acquired pneumonia: Status post antibiotics seems to be improving. ?-Breast cancer: Strongly suspect breast cancer based on CT findings.  She is very clear that she would like no any aggressive intervention.  Will set up hospice.  She is losing ground, unable to do ADLs, has developed pressure ulcers (I was not able to check that today but asked the family to monitor the area). ?- UTI, lower abdominal discomfort, mucus in the stools: had  UTI symptoms prior to being admitted, Rx Macrobid which were stopped at the hospital because she got ABX for pneumonia, subsequently she went home, continue with UTI symptoms  and restarted Macrobid. ?Last UA 04/21/2021 showed moderate leukocytes, WBCs.  No UCX that I can tell. ?At this point I recommend to stop antibiotics bring a urine sample for UA urine culture in 1 week.  If she gets

## 2021-04-30 NOTE — Assessment & Plan Note (Signed)
Hospital follow-up for the following acute issues: ?Community-acquired pneumonia ?Pulmonary nodules (likely mets),  left breast nodules, h/o remote breast ca  UTI ?Since she left the hospital, she is at home;  had an episode of shortness of breath, treated by EMS, got slightly better, declined ER re-evaluation. ?She continue to lose ground physically, weaker, poor appetite, needing help for all her ADLs. ?Mentally however she has improved, she was slightly confused and disoriented when she left the hospital and that is better. ?She asked me directly  if she is dying. ?I told the patient that I believe the CT findings are consistent with breast cancer.  I could not give her a time prognosis but I believe that at this point her best option is hospice; she is very clear that she does not like any aggressive eval or  treatment regards what we believed  is cancer. ?In summary: ?- Community-acquired pneumonia: Status post antibiotics seems to be improving. ?-Breast cancer: Strongly suspect breast cancer based on CT findings.  She is very clear that she would like no any aggressive intervention.  Will set up hospice.  She is losing ground, unable to do ADLs, has developed pressure ulcers (I was not able to check that today but asked the family to monitor the area). ?- UTI, lower abdominal discomfort, mucus in the stools: had  UTI symptoms prior to being admitted, Rx Macrobid which were stopped at the hospital because she got ABX for pneumonia, subsequently she went home, continue with UTI symptoms and restarted Macrobid. ?Last UA 04/21/2021 showed moderate leukocytes, WBCs.  No UCX that I can tell. ?At this point I recommend to stop antibiotics bring a urine sample for UA urine culture in 1 week.  If she gets worse she will let me know.  See AVS. ?- Pain control: Left shoulder pain continues to be a major issue for her.  We had a long discussion again, see previous visit, we agreed to try Tylenol and hydrocodone 5 mg if  needed.  She is aware of the risk of confusion, drowsiness and she is willing to accept them. ?DVT: ?Saw cardiology 04/26/2021, cardiology is planning to follow-up with Doppler studies and consider stop Eliquis ?RTC 4 weeks, virtual (she is referred to hospice) ?

## 2021-05-02 ENCOUNTER — Telehealth: Payer: Self-pay | Admitting: Internal Medicine

## 2021-05-02 NOTE — Telephone Encounter (Signed)
[facility] called and stated they received a hospice referral for the pt. Pt's family would like to know if Larose Kells would be their attending provider throughout hospice. Please advise [phone] ?

## 2021-05-02 NOTE — Telephone Encounter (Signed)
Spoke w/ Tammy at [facility]- informed PCP will remain attending.  ?

## 2021-05-04 ENCOUNTER — Encounter: Payer: Self-pay | Admitting: Cardiovascular Disease

## 2021-05-04 ENCOUNTER — Encounter: Payer: Self-pay | Admitting: Internal Medicine

## 2021-05-06 ENCOUNTER — Telehealth: Payer: Self-pay | Admitting: Internal Medicine

## 2021-05-06 ENCOUNTER — Telehealth: Payer: Self-pay

## 2021-05-06 NOTE — Telephone Encounter (Signed)
Please advise 

## 2021-05-06 NOTE — Telephone Encounter (Signed)
Spoke w/ [place], RN, informed we would need UA and urine culture. Holly verbalized understanding.  ?

## 2021-05-06 NOTE — Telephone Encounter (Signed)
Advised to get a UA urine culture, DX UTI, Rx antibiotics if needed. ? ?

## 2021-05-06 NOTE — Telephone Encounter (Signed)
Comfort care orders signed and faxed back to [facility] at [phone]. Form sent for scanning.  ?

## 2021-05-06 NOTE — Telephone Encounter (Signed)
Our list has to mirror the one from hospice ?

## 2021-05-06 NOTE — Telephone Encounter (Signed)
Hospice nurse from [facility] is calling to ask for [place] for the patient, she states that she has been having burning when peeing. Please advise.  ? ?[id number] [address] AT [address] [facility] [address]  ?[place], [place] [address]  ?Phone:  [phone]  Fax:  [phone]  ?

## 2021-05-06 NOTE — Telephone Encounter (Signed)
Dr. Larose Kells- do you want me to remove any medications from med list?  ?

## 2021-05-13 ENCOUNTER — Inpatient Hospital Stay (HOSPITAL_COMMUNITY): Admission: RE | Admit: 2021-05-13 | Payer: Medicare Other | Source: Ambulatory Visit

## 2021-05-13 ENCOUNTER — Telehealth: Payer: Self-pay

## 2021-05-13 NOTE — Telephone Encounter (Signed)
Hospice orders signed and faxed back to [facility] at [phone]. Form sent for scanning.  

## 2021-05-20 ENCOUNTER — Ambulatory Visit: Payer: Medicare Other | Admitting: Internal Medicine

## 2021-05-22 ENCOUNTER — Ambulatory Visit: Payer: Medicare Other | Admitting: Student

## 2021-05-27 ENCOUNTER — Ambulatory Visit: Payer: Medicare Other | Admitting: Internal Medicine

## 2021-05-31 DIAGNOSIS — S4292XD Fracture of left shoulder girdle, part unspecified, subsequent encounter for fracture with routine healing: Secondary | ICD-10-CM | POA: Diagnosis not present

## 2021-05-31 DIAGNOSIS — M6281 Muscle weakness (generalized): Secondary | ICD-10-CM | POA: Diagnosis not present

## 2021-05-31 DIAGNOSIS — S43085D Other dislocation of left shoulder joint, subsequent encounter: Secondary | ICD-10-CM | POA: Diagnosis not present

## 2021-05-31 DIAGNOSIS — R2681 Unsteadiness on feet: Secondary | ICD-10-CM | POA: Diagnosis not present

## 2021-05-31 DIAGNOSIS — Z9181 History of falling: Secondary | ICD-10-CM | POA: Diagnosis not present

## 2021-06-05 ENCOUNTER — Other Ambulatory Visit: Payer: Self-pay | Admitting: Cardiology

## 2021-06-05 NOTE — Telephone Encounter (Addendum)
Prescription refill request for Eliquis received. ?Indication: DVT ?Last office visit: 04/26/21  Adora Fridge MD ?Scr: 0.96 on 04/21/21 ?Age: 88 ?Weight: 47.2kg ? ?Patient is taking Eliquis '5mg'$  twice daily. ? ?Based on above findings Eliquis 2.'5mg'$  would be the appropriate dose (based on age and weight).  Will forward to Dr Gwenlyn Found to make decision on dose reduction before sending in refill. ? ?Yes, please adjust appropriately. Thx. Per Dr Gwenlyn Found. ? ?Called and spoke with Son.  Explained we would be decreasing Eliquis dose and he understood.  States his mother is under [place] and he will let the Hospice nurse know.  States her health is declining rapidly. ?

## 2021-06-14 ENCOUNTER — Telehealth: Payer: Self-pay | Admitting: Internal Medicine

## 2021-06-14 NOTE — Telephone Encounter (Signed)
Left message for patient to call back and schedule Medicare Annual Wellness Visit (AWV).   Please offer to do virtually or by telephone.  Left office number and my jabber #[phone].  Last AWV:03/19/2017  Please schedule at anytime with Nurse Health Advisor.   

## 2021-06-28 ENCOUNTER — Telehealth: Payer: Self-pay | Admitting: Internal Medicine

## 2021-07-11 NOTE — Telephone Encounter (Signed)
Date of death- 2021/07/12 10:47am

## 2021-07-11 DEATH — deceased

## 2021-12-19 IMAGING — MR MR HEAD W/O CM
9 of 10 series · 35 of 48 positions shown · non-contrast
Comparison: Head CT 07/20/2016.  MRI 11/16/2005.

CLINICAL DATA: Dizziness, persistent/recurrent, cardiac or vascular
cause suspected. Ataxia 2-3 months.

EXAM:
MRI HEAD WITHOUT CONTRAST
TECHNIQUE: Multiplanar, multiecho pulse sequences of the brain and surrounding
structures were obtained without intravenous contrast.

[Series 3: T1 · sagittal · 5.0mm · 0.49mm/px · 3 of 23 slices shown]
[im 1/23]
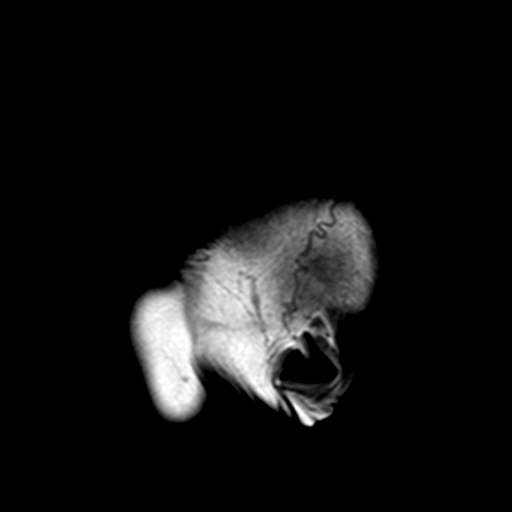
[im 12/23]
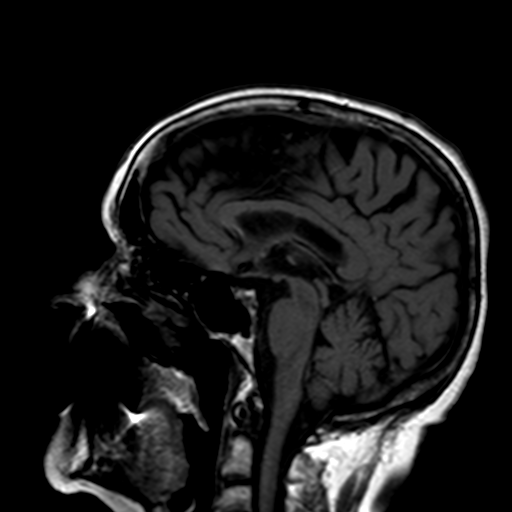
[im 23/23]
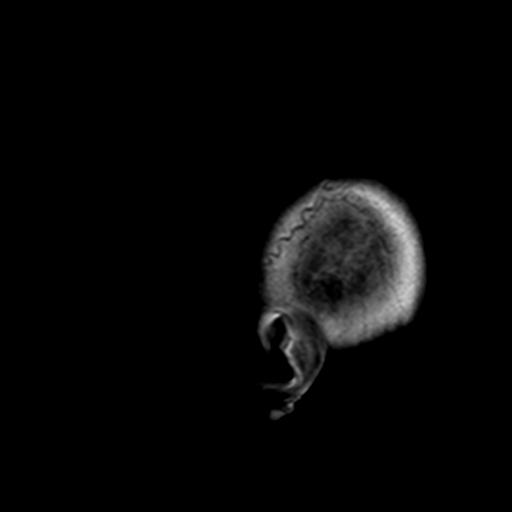

[Series 4: DWI · axial · 3.0mm · 2.00mm/px · z∈[-73,+89]mm · 9 of 111 slices shown (1 of 4)]
[im 1/111]
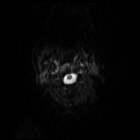
[im 14/111]
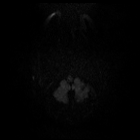
[im 28/111]
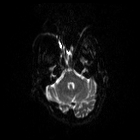
[im 42/111]
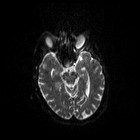
[im 56/111]
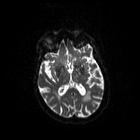
[im 69/111]
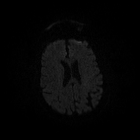
[im 83/111]
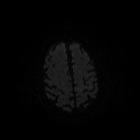
[im 97/111]
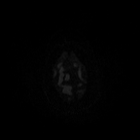
[im 111/111]
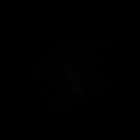

[Series 5: DWI · axial · 3.0mm · 2.00mm/px · z∈[-73,+89]mm · 5 of 56 slices shown (2 of 4)]
[im 1/56]
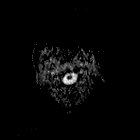
[im 14/56]
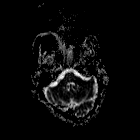
[im 28/56]
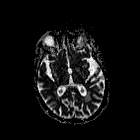
[im 42/56]
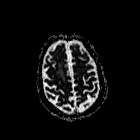
[im 56/56]
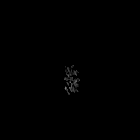

[Series 6: DWI · coronal · 5.0mm · 1.46mm/px · 6 of 77 slices shown (3 of 4)]
[im 1/77]
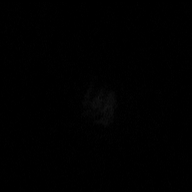
[im 16/77]
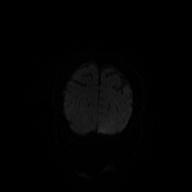
[im 31/77]
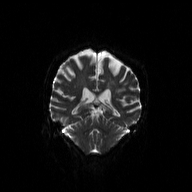
[im 46/77]
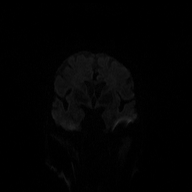
[im 61/77]
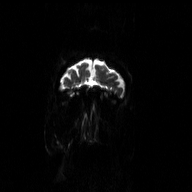
[im 77/77]
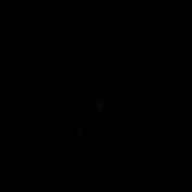

[Series 7: DWI · coronal · 5.0mm · 1.46mm/px · 3 of 40 slices shown (4 of 4)]
[im 1/40]
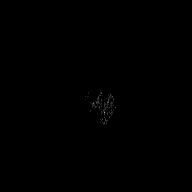
[im 20/40]
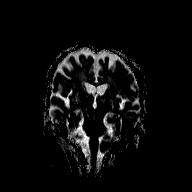
[im 40/40]
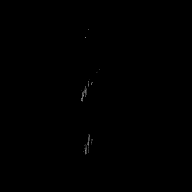

[Series 8: T2 · axial · 5.0mm · 0.36mm/px · z∈[-76,+82]mm · 2 of 24 slices shown (1 of 2)]
[im 1/24]
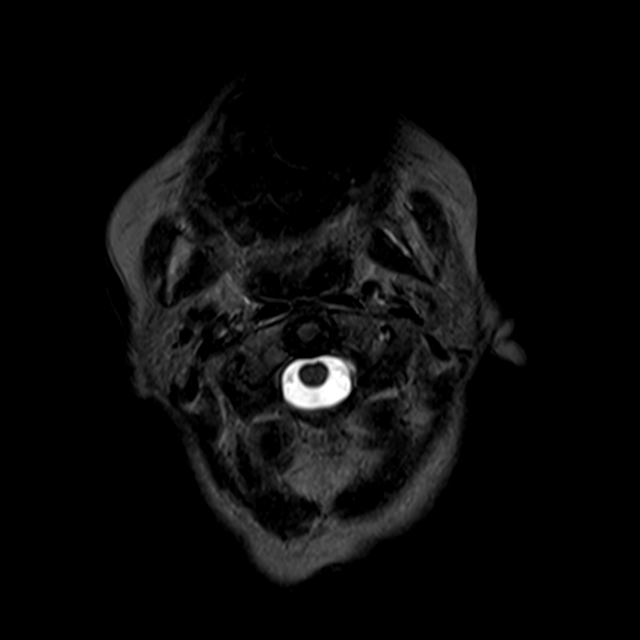
[im 24/24]
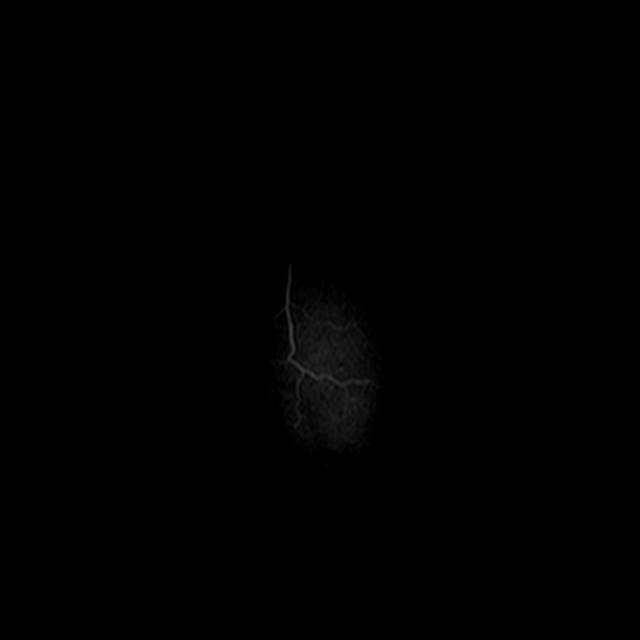

[Series 9: T2 · axial · 5.0mm · 0.45mm/px · z∈[-76,+82]mm · 2 of 24 slices shown (2 of 2)]
[im 1/24]
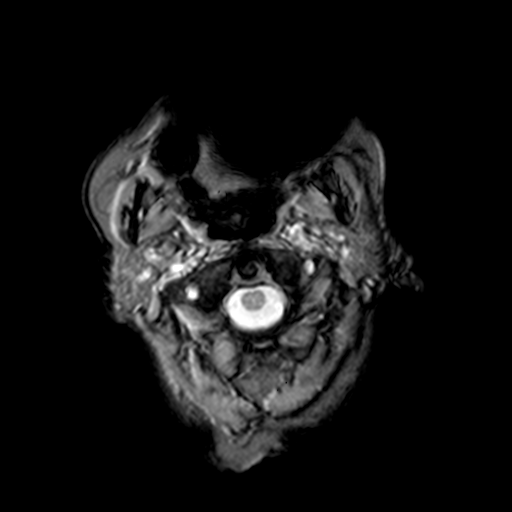
[im 24/24]
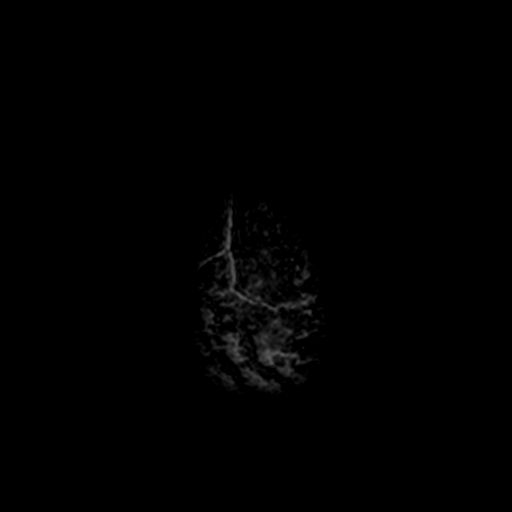

[Series 10: FLAIR · axial · 3.0mm · 0.36mm/px · z∈[-77,+83]mm · 3 of 42 slices shown]
[im 1/42]
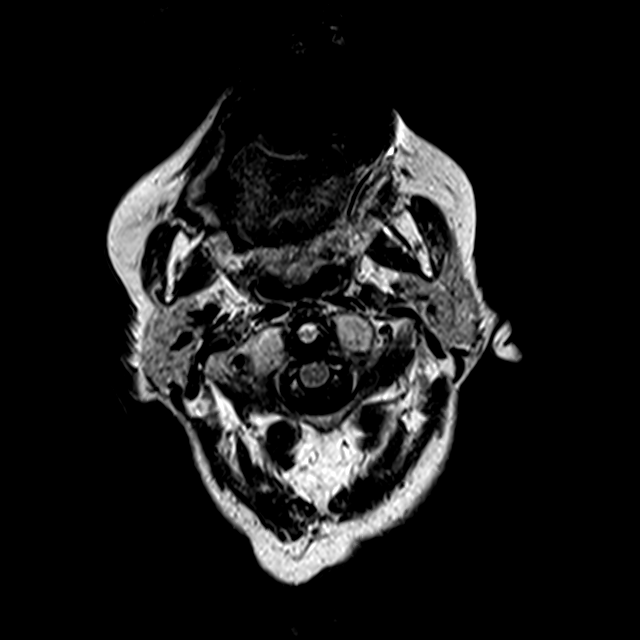
[im 21/42]
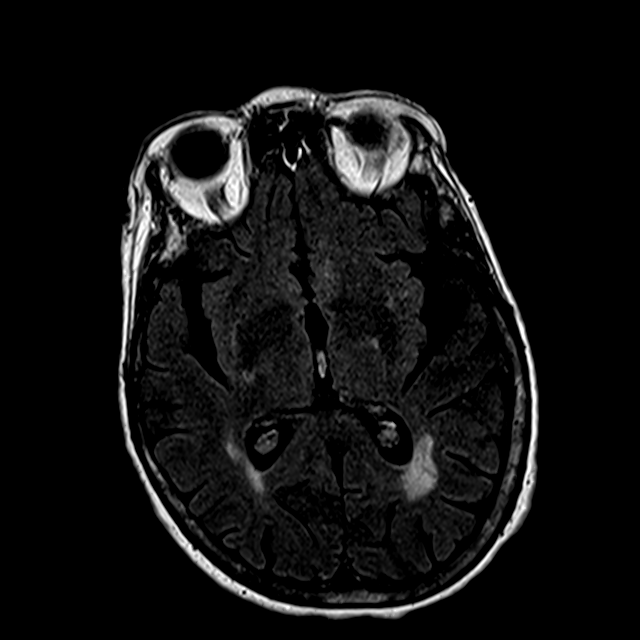
[im 42/42]
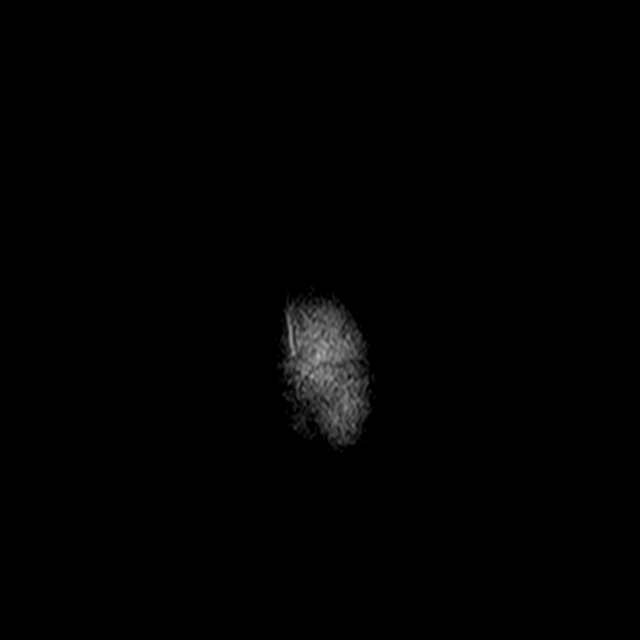

[Series 12: T2 post-contrast · coronal · 5.0mm · 0.72mm/px · 2 of 30 slices shown]
[im 1/30]
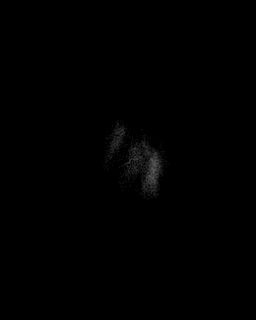
[im 30/30]
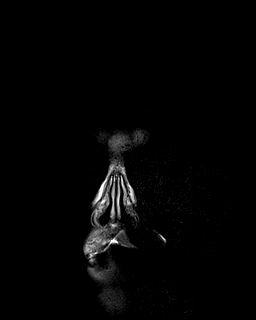

[35 of 48 positions shown; findings below may reference images not displayed]

FINDINGS: Brain: Diffusion imaging does not show any acute or subacute
infarction. No focal abnormality affects the brainstem. Old small
vessel infarction within the left cerebellum. Cerebral hemispheres
show moderate chronic small-vessel ischemic changes throughout the
white matter. No cortical or large vessel territory infarction. No
mass lesion, hemorrhage, hydrocephalus or extra-axial collection.
Findings are progressive since 0550 as expected.

Vascular: Major vessels at the base of the brain show flow.

Skull and upper cervical spine: Negative

Sinuses/Orbits: Clear/normal

Other: None
IMPRESSION: No acute or reversible finding. Old left cerebellar small vessel
infarction. Moderate chronic small-vessel ischemic changes of the
cerebral hemispheric white matter, progressive since [DATE].

## 2023-07-06 NOTE — Telephone Encounter (Signed)
 Patient is deceased.

## 2023-07-10 ENCOUNTER — Telehealth: Payer: Self-pay

## 2023-07-10 NOTE — Telephone Encounter (Signed)
 Form from [facility] received, they are requesting further information regarding Pt's death in 2021-07-20. Form placed in PCP red folder.

## 2023-07-10 NOTE — Telephone Encounter (Signed)
 Completed form and records faxed to [facility] at [phone]. Form sent for scanning.

## 2023-07-10 NOTE — Telephone Encounter (Signed)
 The patient passed away in 2023. She was admitted to the hospital w/ pneumonia, left breast nodules noted on CT pulmonary nodules likely metastasis. She declined further evaluation, was referred to hospice and subsequently died. Suspect cause of death is breast cancer with metastasis. We are sending back my last office visit note and the discharge summary from the hospital
